# Patient Record
Sex: Female | Born: 1955
Health system: Southern US, Community
[De-identification: ages and names within clinical notes are randomized; demographics above are authoritative.]

## PROBLEM LIST (undated history)

## (undated) DIAGNOSIS — M109 Gout, unspecified: Secondary | ICD-10-CM

## (undated) DIAGNOSIS — R600 Localized edema: Secondary | ICD-10-CM

## (undated) DIAGNOSIS — R0609 Other forms of dyspnea: Secondary | ICD-10-CM

## (undated) DIAGNOSIS — M5137 Other intervertebral disc degeneration, lumbosacral region: Secondary | ICD-10-CM

## (undated) DIAGNOSIS — I480 Paroxysmal atrial fibrillation: Secondary | ICD-10-CM

## (undated) DIAGNOSIS — I5042 Chronic combined systolic (congestive) and diastolic (congestive) heart failure: Secondary | ICD-10-CM

## (undated) DIAGNOSIS — G4700 Insomnia, unspecified: Secondary | ICD-10-CM

## (undated) DIAGNOSIS — I10 Essential (primary) hypertension: Secondary | ICD-10-CM

## (undated) DIAGNOSIS — F32A Depression, unspecified: Secondary | ICD-10-CM

## (undated) DIAGNOSIS — R0789 Other chest pain: Secondary | ICD-10-CM

## (undated) DIAGNOSIS — I050 Rheumatic mitral stenosis: Secondary | ICD-10-CM

## (undated) DIAGNOSIS — R0989 Other specified symptoms and signs involving the circulatory and respiratory systems: Secondary | ICD-10-CM

## (undated) DIAGNOSIS — I059 Rheumatic mitral valve disease, unspecified: Secondary | ICD-10-CM

## (undated) DIAGNOSIS — K5790 Diverticulosis of intestine, part unspecified, without perforation or abscess without bleeding: Secondary | ICD-10-CM

## (undated) DIAGNOSIS — Z9889 Other specified postprocedural states: Secondary | ICD-10-CM

## (undated) DIAGNOSIS — Z8679 Personal history of other diseases of the circulatory system: Secondary | ICD-10-CM

## (undated) DIAGNOSIS — R609 Edema, unspecified: Secondary | ICD-10-CM

## (undated) DIAGNOSIS — D649 Anemia, unspecified: Secondary | ICD-10-CM

## (undated) DIAGNOSIS — F329 Major depressive disorder, single episode, unspecified: Secondary | ICD-10-CM

## (undated) DIAGNOSIS — K219 Gastro-esophageal reflux disease without esophagitis: Secondary | ICD-10-CM

## (undated) DIAGNOSIS — I509 Heart failure, unspecified: Secondary | ICD-10-CM

## (undated) DIAGNOSIS — Z87442 Personal history of urinary calculi: Secondary | ICD-10-CM

## (undated) DIAGNOSIS — M51379 Other intervertebral disc degeneration, lumbosacral region without mention of lumbar back pain or lower extremity pain: Secondary | ICD-10-CM

## (undated) DIAGNOSIS — I080 Rheumatic disorders of both mitral and aortic valves: Secondary | ICD-10-CM

## (undated) DIAGNOSIS — R0600 Dyspnea, unspecified: Secondary | ICD-10-CM

## (undated) HISTORY — DX: Insomnia, unspecified: G47.00

## (undated) HISTORY — DX: Essential (primary) hypertension: I10

## (undated) HISTORY — DX: Heart failure, unspecified: I50.9

## (undated) HISTORY — DX: Other chest pain: R07.89

## (undated) HISTORY — DX: Edema, unspecified: R60.9

## (undated) HISTORY — PX: CHOLECYSTECTOMY: SHX55

## (undated) HISTORY — DX: Gastro-esophageal reflux disease without esophagitis: K21.9

## (undated) HISTORY — PX: APPENDECTOMY: SHX54

## (undated) HISTORY — PX: TUBAL LIGATION: SHX77

## (undated) HISTORY — DX: Major depressive disorder, single episode, unspecified: F32.9

## (undated) HISTORY — DX: Rheumatic disorders of both mitral and aortic valves: I08.0

## (undated) HISTORY — DX: Paroxysmal atrial fibrillation: I48.0

## (undated) HISTORY — DX: Localized edema: R60.0

## (undated) HISTORY — DX: Other intervertebral disc degeneration, lumbosacral region without mention of lumbar back pain or lower extremity pain: M51.379

## (undated) HISTORY — DX: Rheumatic mitral valve disease, unspecified: I05.9

## (undated) HISTORY — DX: Anemia, unspecified: D64.9

## (undated) HISTORY — PX: TOTAL ABDOMINAL HYSTERECTOMY: SHX209

## (undated) HISTORY — DX: Morbid (severe) obesity due to excess calories: E66.01

## (undated) HISTORY — DX: Dyspnea, unspecified: R06.00

## (undated) HISTORY — PX: HEEL SPUR SURGERY: SHX665

## (undated) HISTORY — DX: Other forms of dyspnea: R06.09

## (undated) HISTORY — PX: TOTAL KNEE ARTHROPLASTY: SHX125

## (undated) HISTORY — DX: Other intervertebral disc degeneration, lumbosacral region: M51.37

## (undated) HISTORY — DX: Personal history of other diseases of the circulatory system: Z86.79

## (undated) HISTORY — DX: Rheumatic mitral stenosis: I05.0

## (undated) HISTORY — DX: Other specified symptoms and signs involving the circulatory and respiratory systems: R09.89

## (undated) HISTORY — DX: Other specified postprocedural states: Z98.890

## (undated) HISTORY — DX: Depression, unspecified: F32.A

## (undated) SURGERY — ECHOCARDIOGRAM, TRANSESOPHAGEAL
Anesthesia: Moderate Sedation

---

## 2003-11-09 HISTORY — PX: ESOPHAGOGASTRODUODENOSCOPY: SHX1529

## 2004-04-21 ENCOUNTER — Ambulatory Visit (HOSPITAL_COMMUNITY): Admission: RE | Admit: 2004-04-21 | Discharge: 2004-04-21 | Payer: Self-pay | Admitting: Internal Medicine

## 2006-10-06 ENCOUNTER — Ambulatory Visit: Payer: Self-pay | Admitting: Cardiology

## 2007-01-11 ENCOUNTER — Ambulatory Visit: Payer: Self-pay | Admitting: Cardiology

## 2007-01-17 ENCOUNTER — Ambulatory Visit: Payer: Self-pay | Admitting: Cardiology

## 2007-01-20 ENCOUNTER — Inpatient Hospital Stay (HOSPITAL_BASED_OUTPATIENT_CLINIC_OR_DEPARTMENT_OTHER): Admission: RE | Admit: 2007-01-20 | Discharge: 2007-01-20 | Payer: Self-pay | Admitting: Cardiology

## 2007-01-20 ENCOUNTER — Ambulatory Visit: Payer: Self-pay | Admitting: Cardiology

## 2007-02-21 ENCOUNTER — Ambulatory Visit: Payer: Self-pay | Admitting: Cardiology

## 2007-07-10 ENCOUNTER — Ambulatory Visit: Payer: Self-pay | Admitting: Cardiology

## 2007-11-09 HISTORY — PX: BREAST BIOPSY: SHX20

## 2007-12-19 ENCOUNTER — Encounter: Payer: Self-pay | Admitting: Cardiology

## 2008-01-18 ENCOUNTER — Ambulatory Visit: Payer: Self-pay | Admitting: Cardiology

## 2008-01-18 ENCOUNTER — Encounter: Payer: Self-pay | Admitting: Physician Assistant

## 2008-01-25 ENCOUNTER — Encounter: Payer: Self-pay | Admitting: Cardiology

## 2008-01-29 ENCOUNTER — Ambulatory Visit: Payer: Self-pay | Admitting: Cardiology

## 2008-03-09 ENCOUNTER — Ambulatory Visit: Payer: Self-pay | Admitting: Cardiology

## 2008-03-12 ENCOUNTER — Encounter: Payer: Self-pay | Admitting: Cardiology

## 2008-03-28 ENCOUNTER — Ambulatory Visit: Payer: Self-pay | Admitting: Cardiology

## 2008-06-19 ENCOUNTER — Ambulatory Visit: Payer: Self-pay | Admitting: Cardiology

## 2008-06-24 ENCOUNTER — Ambulatory Visit: Payer: Self-pay | Admitting: Cardiology

## 2008-06-24 ENCOUNTER — Encounter: Payer: Self-pay | Admitting: Physician Assistant

## 2008-10-29 ENCOUNTER — Ambulatory Visit (HOSPITAL_COMMUNITY): Admission: RE | Admit: 2008-10-29 | Discharge: 2008-10-29 | Payer: Self-pay | Admitting: Neurosurgery

## 2009-02-13 ENCOUNTER — Encounter: Payer: Self-pay | Admitting: Cardiology

## 2009-02-13 ENCOUNTER — Ambulatory Visit: Payer: Self-pay | Admitting: Cardiology

## 2009-02-27 ENCOUNTER — Ambulatory Visit: Payer: Self-pay | Admitting: Cardiology

## 2009-03-05 ENCOUNTER — Ambulatory Visit: Payer: Self-pay | Admitting: Cardiology

## 2009-04-02 ENCOUNTER — Ambulatory Visit: Payer: Self-pay | Admitting: Cardiology

## 2009-04-03 ENCOUNTER — Encounter: Payer: Self-pay | Admitting: Cardiology

## 2009-07-07 DIAGNOSIS — I4891 Unspecified atrial fibrillation: Secondary | ICD-10-CM | POA: Insufficient documentation

## 2009-07-07 DIAGNOSIS — I080 Rheumatic disorders of both mitral and aortic valves: Secondary | ICD-10-CM | POA: Insufficient documentation

## 2009-07-07 DIAGNOSIS — I10 Essential (primary) hypertension: Secondary | ICD-10-CM | POA: Insufficient documentation

## 2009-07-08 ENCOUNTER — Encounter: Payer: Self-pay | Admitting: Cardiology

## 2009-09-02 ENCOUNTER — Encounter: Payer: Self-pay | Admitting: Cardiology

## 2009-09-21 ENCOUNTER — Encounter: Payer: Self-pay | Admitting: Cardiology

## 2009-09-22 ENCOUNTER — Encounter (INDEPENDENT_AMBULATORY_CARE_PROVIDER_SITE_OTHER): Payer: Self-pay | Admitting: *Deleted

## 2009-09-22 ENCOUNTER — Ambulatory Visit: Payer: Self-pay | Admitting: Cardiology

## 2009-09-22 DIAGNOSIS — I5033 Acute on chronic diastolic (congestive) heart failure: Secondary | ICD-10-CM | POA: Insufficient documentation

## 2009-09-25 ENCOUNTER — Encounter: Payer: Self-pay | Admitting: Cardiology

## 2009-09-26 ENCOUNTER — Ambulatory Visit: Payer: Self-pay | Admitting: Cardiology

## 2009-09-26 ENCOUNTER — Encounter: Payer: Self-pay | Admitting: Cardiology

## 2009-10-07 ENCOUNTER — Encounter (INDEPENDENT_AMBULATORY_CARE_PROVIDER_SITE_OTHER): Payer: Self-pay | Admitting: *Deleted

## 2009-11-08 HISTORY — PX: COLONOSCOPY: SHX174

## 2009-11-11 ENCOUNTER — Encounter: Payer: Self-pay | Admitting: Cardiology

## 2009-11-14 ENCOUNTER — Ambulatory Visit: Payer: Self-pay | Admitting: Cardiology

## 2009-12-26 ENCOUNTER — Ambulatory Visit: Payer: Self-pay | Admitting: Cardiology

## 2009-12-26 DIAGNOSIS — R0609 Other forms of dyspnea: Secondary | ICD-10-CM | POA: Insufficient documentation

## 2009-12-26 DIAGNOSIS — R0600 Dyspnea, unspecified: Secondary | ICD-10-CM | POA: Insufficient documentation

## 2010-01-07 ENCOUNTER — Encounter: Payer: Self-pay | Admitting: Cardiology

## 2010-01-27 ENCOUNTER — Encounter: Payer: Self-pay | Admitting: Cardiology

## 2010-02-09 ENCOUNTER — Ambulatory Visit: Payer: Self-pay | Admitting: Cardiology

## 2010-02-09 ENCOUNTER — Encounter: Payer: Self-pay | Admitting: Cardiology

## 2010-02-20 ENCOUNTER — Encounter (INDEPENDENT_AMBULATORY_CARE_PROVIDER_SITE_OTHER): Payer: Self-pay | Admitting: *Deleted

## 2010-07-30 ENCOUNTER — Ambulatory Visit: Payer: Self-pay | Admitting: Cardiology

## 2010-07-30 ENCOUNTER — Encounter (INDEPENDENT_AMBULATORY_CARE_PROVIDER_SITE_OTHER): Payer: Self-pay | Admitting: *Deleted

## 2010-07-30 DIAGNOSIS — R072 Precordial pain: Secondary | ICD-10-CM | POA: Insufficient documentation

## 2010-08-06 ENCOUNTER — Ambulatory Visit: Payer: Self-pay | Admitting: Cardiology

## 2010-08-10 ENCOUNTER — Telehealth (INDEPENDENT_AMBULATORY_CARE_PROVIDER_SITE_OTHER): Payer: Self-pay | Admitting: *Deleted

## 2010-08-11 ENCOUNTER — Encounter: Payer: Self-pay | Admitting: Cardiology

## 2010-08-11 ENCOUNTER — Telehealth (INDEPENDENT_AMBULATORY_CARE_PROVIDER_SITE_OTHER): Payer: Self-pay | Admitting: *Deleted

## 2010-08-11 ENCOUNTER — Encounter (INDEPENDENT_AMBULATORY_CARE_PROVIDER_SITE_OTHER): Payer: Self-pay | Admitting: *Deleted

## 2010-08-11 DIAGNOSIS — R9439 Abnormal result of other cardiovascular function study: Secondary | ICD-10-CM | POA: Insufficient documentation

## 2010-08-12 ENCOUNTER — Ambulatory Visit (HOSPITAL_COMMUNITY): Admission: RE | Admit: 2010-08-12 | Discharge: 2010-08-13 | Payer: Self-pay | Admitting: Cardiology

## 2010-08-12 ENCOUNTER — Ambulatory Visit: Payer: Self-pay | Admitting: Cardiology

## 2010-08-13 ENCOUNTER — Encounter: Payer: Self-pay | Admitting: Physician Assistant

## 2010-08-17 ENCOUNTER — Encounter: Payer: Self-pay | Admitting: Cardiology

## 2010-08-28 ENCOUNTER — Ambulatory Visit: Payer: Self-pay | Admitting: Physician Assistant

## 2010-08-28 DIAGNOSIS — I5032 Chronic diastolic (congestive) heart failure: Secondary | ICD-10-CM | POA: Insufficient documentation

## 2010-09-07 ENCOUNTER — Encounter: Payer: Self-pay | Admitting: Cardiology

## 2010-09-08 ENCOUNTER — Encounter (INDEPENDENT_AMBULATORY_CARE_PROVIDER_SITE_OTHER): Payer: Self-pay | Admitting: *Deleted

## 2010-09-11 ENCOUNTER — Ambulatory Visit: Payer: Self-pay | Admitting: Physician Assistant

## 2010-09-11 DIAGNOSIS — D649 Anemia, unspecified: Secondary | ICD-10-CM | POA: Insufficient documentation

## 2010-09-17 ENCOUNTER — Encounter: Payer: Self-pay | Admitting: Cardiology

## 2010-09-17 DIAGNOSIS — E876 Hypokalemia: Secondary | ICD-10-CM | POA: Insufficient documentation

## 2010-09-25 ENCOUNTER — Encounter: Payer: Self-pay | Admitting: Physician Assistant

## 2010-10-19 ENCOUNTER — Encounter: Payer: Self-pay | Admitting: Cardiology

## 2010-10-19 ENCOUNTER — Encounter (INDEPENDENT_AMBULATORY_CARE_PROVIDER_SITE_OTHER): Payer: Self-pay | Admitting: *Deleted

## 2010-12-07 ENCOUNTER — Encounter: Payer: Self-pay | Admitting: Physician Assistant

## 2010-12-08 NOTE — Miscellaneous (Signed)
Summary: Orders Update - TEE  Clinical Lists Changes  Orders: Added new Referral order of Trans Esophageal Echocardiogram (TEE) - Signed

## 2010-12-08 NOTE — Assessment & Plan Note (Signed)
Summary: 2 WK F/U PER 08/28/10 OV W/Toni [name]-JM   Visit Type:  Follow-up Primary Toni [name]:  Dr. Sherril Croon   History of Present Illness: patient returns for early schedule followup.  She reports improved diuresis on Demadex. Her labs were stable at time of last OV, with potassium 4.3, creatinine 1.0, and BNP 260.  She has lost 1 pound since her last visit.  She continues to have a sensation of "air hunger", and is now beginning to think it may be related to dysphagia. With respect to her chronic LE edema, she continues to wear compression stockings while at work, and notes no appreciable exacerbation.    Preventive Screening-Counseling & Management  Alcohol-Tobacco     Smoking Status: quit     Year Quit: 1997  Current Medications (verified): 1)  Aspirin 81 Mg Tbec (Aspirin) .... Take 1 Tablet By Mouth Once A Day 2)  Benicar Hct 40-25 Mg Tabs (Olmesartan Medoxomil-Hctz) .... Take 1 Tablet By Mouth Once A Day 3)  Metoprolol Tartrate 50 Mg Tabs (Metoprolol Tartrate) .... Take One Tablet By Mouth Twice A Day 4)  Protonix 40 Mg Tbec (Pantoprazole Sodium) .... Take 1 Tablet By Mouth Once A Day 5)  Potassium Chloride Crys Cr 20 Meq Cr-Tabs (Potassium Chloride Crys Cr) .... Take One Tablet By Mouth Daily 6)  Nitroglycerin 0.4 Mg Subl (Nitroglycerin) .... One Tablet Under Tongue Every 5 Minutes As Needed For Chest Pain---May Repeat Times Three 7)  Trazodone Hcl 50 Mg Tabs (Trazodone Hcl) .... 1/2 - 1 Tab By Mouth At Bedtime For Insomnia 8)  Levbid 0.375 Mg Xr12h-Tab (Hyoscyamine Sulfate) .... Take 1 Tablet By Mouth Two Times A Day 9)  Hydrocodone-Acetaminophen 7.5-500 Mg Tabs (Hydrocodone-Acetaminophen) .... As Needed 10)  Indomethacin 25 Mg Caps (Indomethacin) .... Take 1 Tablet By Mouth Two Times A Day As Needed 11)  Demadex 20 Mg Tabs (Torsemide) .... Take 1 Tablet By Mouth Once A Day  Allergies (verified): 1)  ! * Ivp Dye  Comments:  Nurse/Medical Assistant: The patient's medication list  and allergies were reviewed with the patient and were updated in the Medication and Allergy Lists.  Past History:  Past Medical History: MITRAL REGURGITATION (ICD-396.3) ATRIAL FIBRILLATION (ICD-427.31) HYPERTENSION, UNSPECIFIED (ICD-401.9) Degenerative disk disease.  Insomnia 1. Paroxysmal atrial fibrillation, stable without recurrence. 2. Moderate mitral regurgitation followed by yearly echocardiography. 3. History of volume overload diastolic heart failure stable. 4. Hypertension. 5. Degenerative disk disease. 6. Insomnia. Normal coronary arteries, 10/11 Diastolic heart failure Chronic peripheral edema  Review of Systems       No fevers, chills, hemoptysis, dysphagia, melena, hematocheezia, hematuria, rash, claudication, orthopnea, pnd, pedal edema. All other systems negative.   Vital Signs:  Patient profile:   55 year old female Height:      69 inches Weight:      246 pounds O2 Sat:      99 % on Room air Pulse rate:   81 / minute BP sitting:   115 / 68  (left arm) Cuff size:   large  Vitals Entered By: Carlye Grippe (September 11, 2010 1:35 PM)  O2 Flow:  Room air  Physical Exam  Additional Exam:  GEN: 55 year old female, moderately obese, sitting upright, no distress HEENT: NCAT,PERRLA,EOMI NECK: palpable pulses, no bruits; no JVD; no TM LUNGS: CTA bilaterally HEART: RRR (S1S2); no significant murmurs; no rubs; no gallops ABD: soft, NT; intact BS EXT: stable right groin, with no hematoma, ecchymosis, or bruit; 1+ bilateral peripheral edema SKIN: warm, dry MUSC:  no obvious deformity NEURO: A/O (x3)     Impression & Recommendations:  Problem # 1:  CHRONIC DIASTOLIC HEART FAILURE (ICD-428.32)  euvolemic by history and physical examination. Recent BNP level to 260. Diuresing well with substitution of Demadex for furosemide, with recent stable labs. Will repeat metabolic profile and BNP level, and adjust supplemental potassium,  accordingly.  Orders: T-Basic Metabolic Panel [id number]) T-BNP  (B Natriuretic Peptide) ([id number])  Problem # 2:  HYPERTENSION, UNSPECIFIED (ICD-401.9)  continues to remain well-controlled  Orders: T-Basic Metabolic Panel [phone]) T-BNP  (B Natriuretic Peptide) ([id number])  Problem # 3:  ATRIAL FIBRILLATION (ICD-427.31)  maintaining NSR on physical examination. CHADS score of 1 (HTN), on aspirin.  Orders: T-Basic Metabolic Panel [id number]) T-BNP  (B Natriuretic Peptide) ([id number])  Problem # 4:  MITRAL REGURGITATION (ICD-396.3) Assessment: Comment Only  Problem # 5:  ANEMIA (ICD-285.9)  stable hemoglobin 10.6, by recent labs  Patient Instructions: 1)  Labs:  BMET, BNP today 2)  Follow up in  4 months

## 2010-12-08 NOTE — Letter (Signed)
Summary: Internal Correspondence/ FAXED OUTPATIENT CATH  Internal Correspondence/ FAXED OUTPATIENT CATH   Imported By: Dorise Hiss 08/25/2010 15:39:27  _____________________________________________________________________  External Attachment:    Type:   Image     Comment:   External Document

## 2010-12-08 NOTE — Miscellaneous (Signed)
Summary: Orders Update - bmet  Clinical Lists Changes  Problems: Added new problem of HYPOKALEMIA (ICD-276.8) Orders: Added new Test order of T-Basic Metabolic Panel [id number]) - Signed

## 2010-12-08 NOTE — Progress Notes (Signed)
Summary: Patient returning call   Phone Note Call from Patient Call back at Home Phone [phone]   Caller: Patient Reason for Call: Talk to Nurse Summary of Call: Patient called in reference to message she received about needing a cath.    You can reach patient on her cell [phone] or third floor at [facility]  X [phone] Initial call taken by: Claudette Laws,  August 10, 2010 12:45 PM  Follow-up for Phone Call        patient called back if you call 10/4 before 10:30 call her house after 11:00 call her work number Claudette Laws  August 10, 2010 4:05 PM  Spoke with pt this morning and she would like to get done as soon as possible.  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   Follow-up by: Hoover Brunette, LPN,  August 11, 2010 10:23 AM  Additional Follow-up for Phone Call Additional follow up Details #1::        Left heart cath scheduled for Wednesday, October 5  at 10:00 in the main lab at [facility].  She will need to arrive at 8:00 for same day work.   Hoover Brunette, LPN  August 11, 2010 10:31 AM   Time changed to 3:30 due to pt transportation. Patient aware of above. Additional Follow-up by: Hoover Brunette, LPN,  August 11, 2010 4:36 PM  New Problems: OTH NONSPECIFIC ABNORM CV SYSTEM FUNCTION STUDY (ICD-794.39)   New Problems: OTH NONSPECIFIC ABNORM CV SYSTEM FUNCTION STUDY (ICD-794.39)

## 2010-12-08 NOTE — Letter (Signed)
Summary: Engineer, materials at [facility]  [address]   [place], [place] [address]   Phone: [phone]  Fax: [phone]        February 20, 2010 MRN: [id number]   Toni [name] [address] [place], [place]  [address]   Dear Ms. Thurmond Butts,  Your test ordered by Selena Batten has been reviewed by your physician (or physician assistant) and was found to be normal or stable. Your physician (or physician assistant) felt no changes were needed at this time.  __X__ Echocardiogram  ____ Cardiac Stress Test  ____ Lab Work  ____ Peripheral vascular study of arms, legs or neck  ____ CT scan or X-ray  ____ Lung or Breathing test  ____ Other:   Thank you.   Hoover Brunette, LPN    Duane Boston, M.D., F.A.C.C. Thressa Sheller, M.D., F.A.C.C. Oneal Grout, M.D., F.A.C.C. Cheree Ditto, M.D., F.A.C.C. Daiva Nakayama, M.D., F.A.C.C. Kenney Houseman, M.D., F.A.C.C. Jeanne Ivan, PA-C

## 2010-12-08 NOTE — Letter (Signed)
Summary: Lexiscan or Dobutamine Pharmacist, community at [facility]  [address]   [place], [place] [address]   Phone: [phone]  Fax: [phone]      [facility]  Lexiscan or Dobutamine Cardiolite Strss Test    Toni [name]  Appointment Date:_  Appointment Time:_  Your doctor has ordered a CARDIOLITE STRESS TEST using a medication to stimulate exercise so that you will not have to walk on the treadmill to determine the condition of your heart during stress. If you take blood pressure medication, ask your doctor if you should take it the day of your test. You should not have anything to eat or drink at least 4 hours before your test is scheduled, and no caffeine, including decaffeinated tea and coffee, chocolate, and soft drinks for 24 hours before your test.  You will need to register at the Outpatient/Main Entrance at the hospital 15 minutes before your appointment time. It is a good idea to bring a copy of your order with you. They will direct you to the Diagnostic Imaging (Radiology) Department.  You will be asked to undress from the waist up and given a hospital gown to wear, so dress comfortably from the waist down for example: Sweat pants, shorts, or skirt Rubber soled lace up shoes (tennis shoes)  Plan on about three hours from registration to release from the hospital

## 2010-12-08 NOTE — Letter (Signed)
Summary: Generic Engineer, agricultural at [facility] [address]   [place], [place] [address]   Phone: [phone]  Fax: [phone]        September 08, 2010 MRN: [id number]    Toni [name] [address] [place], [place]  [address]    Toni [name] is a patient of mine. She has significant underlying heart disease with mitral regurgitation and diastolic dysfunction limiting her daily activities. I am writing this letter so she can obtain a parking space that is closer to the entrance of the hospital. It is not advised for patient to walk a long distance when coming to her work place. If you've any questions regarding this request please do not hesitate to contact me  Sincerely, Lewayne Bunting, MD, [facility]  September 07, 2010 3:31 PM          Sincerely,  Hoover Brunette, LPN  This letter has been electronically signed by your physician.

## 2010-12-08 NOTE — Miscellaneous (Signed)
  Clinical Lists Changes  orothy Rambeau is a patient of mine. She has significant underlying heart disease with mitral regurgitation and diastolic dysfunction limiting her daily activities. I am writing this letter so she can obtain a parking space that is closer to the entrance of the hospital. It is not advised for patient to walk a long distance when coming to her work place. If you've any questions regarding this request please do not hesitate to contact me  Sincerely, Lewayne Bunting, MD, [facility]  September 07, 2010 3:31 PM  Appended Document:  Will give to patient at OV on 11/4.

## 2010-12-08 NOTE — Miscellaneous (Signed)
Summary: Orders Update  Clinical Lists Changes  Orders: Added new Referral order of 2-D Echocardiogram (2D Echo) - Signed 

## 2010-12-08 NOTE — Assessment & Plan Note (Signed)
Summary: 6 MO FU PER AUG REMINDER-SRS   Visit Type:  Follow-up Primary Courtland Reas:  Dr. Sherril Croon   History of Present Illness: the patient is a 55 year old female prior history of moderate mitral regurgitation and chronic dyspnea. The patient is a strip diastolic heart failure. She states however that she is doing somewhat better. She is chronic lower extremity edema that is not responsive to increased dosing of Lasix. The patient however does not wear compression stockings during her work. She stands most of the time. She denies any substernal chest pain. She has no orthopnea or PND. He denies any palpitations or syncope.  Preventive Screening-Counseling & Management  Alcohol-Tobacco     Smoking Status: quit     Year Quit: 1997  Current Medications (verified): 1)  Aspirin 81 Mg Tbec (Aspirin) .... Take 1 Tablet By Mouth Once A Day 2)  Benicar Hct 40-12.5 Mg Tabs (Olmesartan Medoxomil-Hctz) .... Take 1 Tablet By Mouth Every Morning 3)  Furosemide 80 Mg Tabs (Furosemide) .... Take 1 Tablet By Mouth Two Times A Day 4)  Lopressor 100 Mg Tabs (Metoprolol Tartrate) .... Take 1 Tablet By Mouth Two Times A Day 5)  Protonix 40 Mg Tbec (Pantoprazole Sodium) .... Take 1 Tablet By Mouth Once A Day 6)  Potassium Chloride Crys Cr 20 Meq Cr-Tabs (Potassium Chloride Crys Cr) .... Take One Tablet By Mouth Twice A Day 7)  Nitroglycerin 0.4 Mg Subl (Nitroglycerin) .... One Tablet Under Tongue Every 5 Minutes As Needed For Chest Pain---May Repeat Times Three 8)  Trazodone Hcl 50 Mg Tabs (Trazodone Hcl) .... 1/2 - 1 Tab By Mouth At Bedtime For Insomnia 9)  Levbid 0.375 Mg Xr12h-Tab (Hyoscyamine Sulfate) .... Take 1 Tablet By Mouth Two Times A Day 10)  Hydrocodone-Acetaminophen 5-500 Mg Tabs (Hydrocodone-Acetaminophen) .... As Needed Pain 11)  Indomethacin 25 Mg Caps (Indomethacin) .... Take 1 Tablet By Mouth Two Times A Day As Needed  Allergies (verified): No Known Drug Allergies  Comments:  Nurse/Medical  Assistant: The patient's medications were reviewed with the patient and were updated in the Medication List. Pt verbally confirmed medications.  Cyril Loosen, RN, BSN (July 30, 2010 10:49 AM)  Past History:  Past Medical History: Last updated: 09/21/2009 MITRAL REGURGITATION (ICD-396.3) ATRIAL FIBRILLATION (ICD-427.31) HYPERTENSION, UNSPECIFIED (ICD-401.9) Degenerative disk disease.  Insomnia 1. Paroxysmal atrial fibrillation, stable without recurrence. 2. Moderate mitral regurgitation followed by yearly echocardiography. 3. History of volume overload diastolic heart failure stable. 4. Hypertension. 5. Degenerative disk disease. 6. Insomnia.  Past Surgical History: Last updated: 07/04/2009 Abdominal Hysterectomy-Total Appendectomy Cholecystectomy Knee Arthroplasty-Total  Family History: Last updated: 07/04/2009 Family History of Cancer:  Family History of CVA or Stroke:  Family History of Diabetes:  Family History of Hyperlipidemia:  Family History of Hypertension:   Social History: Last updated: 07/04/2009 Full Time Single  Tobacco Use - Former.  Alcohol Use - no Regular Exercise - yes Drug Use - no  Risk Factors: Smoking Status: quit (07/30/2010)  Review of Systems       The patient complains of shortness of breath.  The patient denies fatigue, malaise, fever, weight gain/loss, vision loss, decreased hearing, hoarseness, chest pain, palpitations, prolonged cough, wheezing, sleep apnea, coughing up blood, abdominal pain, blood in stool, nausea, vomiting, diarrhea, heartburn, incontinence, blood in urine, muscle weakness, joint pain, leg swelling, rash, skin lesions, headache, fainting, dizziness, depression, anxiety, enlarged lymph nodes, easy bruising or bleeding, and environmental allergies.    Vital Signs:  Patient profile:   55 year old female  Height:      69 inches Weight:      249.25 pounds BMI:     36.94 Pulse rate:   65 / minute BP sitting:    136 / 82  (left arm) Cuff size:   large  Vitals Entered By: Cyril Loosen, RN, BSN (July 30, 2010 10:44 AM)  Nutrition Counseling: Patient's BMI is greater than 25 and therefore counseled on weight management options. Comments Follow up visit   Physical Exam  Additional Exam:  General: Well-developed, well-nourished in no distress head: Normocephalic and atraumatic eyes PERRLA/EOMI intact, conjunctiva and lids normal nose: No deformity or lesions mouth normal dentition, normal posterior pharynx neck: Supple, no JVD.  No masses, thyromegaly or abnormal cervical nodes lungs: Normal breath sounds bilaterally without wheezing.  Normal percussion heart: regular rate and rhythm with normal S1 and S2, no S3 or S4.  PMI is normal.  2/6 holosystolic murmur at the apex. abdomen: Normal bowel sounds, abdomen is soft and nontender without masses, organomegaly or hernias noted.  No hepatosplenomegaly musculoskeletal: Back normal, normal gait muscle strength and tone normal pulsus: Pulse is normal in all 4 extremities Extremities: 1+ peripheral pitting edema neurologic: Alert and oriented x 3 skin: Intact without lesions or rashes cervical nodes: No significant adenopathy psychologic: Normal affect    EKG  Procedure date:  07/30/2010  Findings:      normal sinus rhythm. Left atrial enlargement. Left ventricle hypertrophy. Heart rate 65 beats per minute  Impression & Recommendations:  Problem # 1:  ACUTE ON CHRONIC DIASTOLIC HEART FAILURE (ICD-428.33) patient has no definite evidence of volume overload. She has chronic dependent lower extremity edema in part due to venous insufficiency. I recommended compression stockings. Her updated medication list for this problem includes:    Aspirin 81 Mg Tbec (Aspirin) .Marland Kitchen... Take 1 tablet by mouth once a day    Benicar Hct 40-12.5 Mg Tabs (Olmesartan medoxomil-hctz) .Marland Kitchen... Take 1 tablet by mouth every morning    Furosemide 80 Mg Tabs  (Furosemide) .Marland Kitchen... Take 1 tablet by mouth two times a day    Lopressor 100 Mg Tabs (Metoprolol tartrate) .Marland Kitchen... Take 1 tablet by mouth two times a day    Nitroglycerin 0.4 Mg Subl (Nitroglycerin) ..... One tablet under tongue every 5 minutes as needed for chest pain---may repeat times three  Problem # 2:  MITRAL REGURGITATION (ICD-396.3) the patient has moderate mitral regurgitation. She had a recent echocardiogram. We will continue to follow this. I do not think she has symptomatic mitral regurgitation.  Problem # 3:  HYPERTENSION, UNSPECIFIED (ICD-401.9) blood pressure control. No adjustment of medications. Her updated medication list for this problem includes:    Aspirin 81 Mg Tbec (Aspirin) .Marland Kitchen... Take 1 tablet by mouth once a day    Benicar Hct 40-12.5 Mg Tabs (Olmesartan medoxomil-hctz) .Marland Kitchen... Take 1 tablet by mouth every morning    Furosemide 80 Mg Tabs (Furosemide) .Marland Kitchen... Take 1 tablet by mouth two times a day    Lopressor 100 Mg Tabs (Metoprolol tartrate) .Marland Kitchen... Take 1 tablet by mouth two times a day  Other Orders: EKG w/ Interpretation (93000) Nuclear Med (Nuc Med)  Patient Instructions: 1)  Lexiscan stress test 2)  Follow up in  6 months

## 2010-12-08 NOTE — Letter (Signed)
Summary: Cardiac Cath Instructions - Main Lab  [facility] at [facility]. [address]   [place], [place] [address]   Phone: [phone]  Fax: [phone]     08/11/2010 MRN: [id number]  Cyenna Huckins [address] [place], [place]  [address]  Dear Ms. CERRO,   You are scheduled for Cardiac Catheterization on Wednesday, October 5 at 10:00 with Dr. Juanda Chance.  Please arrive at the [facility] at 8:00     a.m. on the day of your procedure.  1. DIET     __X__ Nothing to eat or drink after midnight except your medications with a sip of water.  2. Come to the [place] office on             for lab work.  The lab at [facility] is open from 8:30 a.m. to 1:30 p.m. and 2:30 p.m. to 5:00 p.m.  The lab at [facility] is open from 7:30 a.m. to 5:30 p.m.  You do not have to be fasting.  3. MAKE SURE YOU TAKE YOUR ASPIRIN.  4. __X___ DO NOT TAKE these medications before your procedure:  Benicar/HCTZ and Furosemide.       5. __X___ YOU MAY TAKE ALL of your remaining medications with a small amount of water.  6._____ START NEW medications:  7._____ Pre-med instructions:     _______________________________________________________________________  8. Plan for one night stay - bring personal belongings (i.e. toothpaste, toothbrush, etc.)  9. Bring a current list of your medications and current insurance cards.  10.Must have a responsible person to drive you home.   11.Someone must be with you for the first 24 hours after you arrive home.  9. Please wear clothes that are easy to get on and off and wear slip-on shoes.  *Special note: Every effort is made to have your procedure done on time.  Occasionally there are emergencies that present themselves at the hospital that may cause delays.  Please be patient if a delay does occur.  If you have any questions after you get home, please call the office at the number listed above.  Hoover Brunette, LPN

## 2010-12-08 NOTE — Assessment & Plan Note (Signed)
Summary: post cath   Visit Type:  Follow-up Primary Kirstina Leinweber:  Dr. Sherril Croon   History of Present Illness: patient presents for post catheterization followup, following recent abnormal nuclear imaging scan suggestive interferonbasal ischemia. Patient was recently seen here in the clinic, by Dr. Andee Lineman, and has history of chronic CHF and moderate mitral regurgitation.   Coronary angiography, however, yielded normal coronary arteries, but with small area of inferior HK (uncertain etiology). Ejection fraction estimated at 60%, with no significant MR. Dr. Juanda Chance did find evidence of mild/moderate LV EDP, consistent with diastolic heart failure.  Patient was kept for overnight observation, but was sent home on previous home medication regimen.  She presents today complaining of persistent DOE, orthopnea, and chronic lower extremity edema. She does use support stockings while at work. She does not add additional salt to her prepared foods. She has lost 2 pounds, since her most recent visit.  Patient suggests that her long time dose of furosemide does not appear to be effective.  Preventive Screening-Counseling & Management  Alcohol-Tobacco     Smoking Status: quit     Year Quit: 1997  Current Medications (verified): 1)  Aspirin 81 Mg Tbec (Aspirin) .... Take 1 Tablet By Mouth Once A Day 2)  Benicar Hct 40-12.5 Mg Tabs (Olmesartan Medoxomil-Hctz) .... Take 1 Tablet By Mouth Every Morning 3)  Furosemide 80 Mg Tabs (Furosemide) .... Take 1 Tablet By Mouth Two Times A Day 4)  Lopressor 100 Mg Tabs (Metoprolol Tartrate) .... Take 1 Tablet By Mouth Two Times A Day 5)  Protonix 40 Mg Tbec (Pantoprazole Sodium) .... Take 1 Tablet By Mouth Once A Day 6)  Potassium Chloride Crys Cr 20 Meq Cr-Tabs (Potassium Chloride Crys Cr) .... Take One Tablet By Mouth Twice A Day 7)  Nitroglycerin 0.4 Mg Subl (Nitroglycerin) .... One Tablet Under Tongue Every 5 Minutes As Needed For Chest Pain---May Repeat Times  Three 8)  Trazodone Hcl 50 Mg Tabs (Trazodone Hcl) .... 1/2 - 1 Tab By Mouth At Bedtime For Insomnia 9)  Levbid 0.375 Mg Xr12h-Tab (Hyoscyamine Sulfate) .... Take 1 Tablet By Mouth Two Times A Day 10)  Hydrocodone-Acetaminophen 7.5-500 Mg Tabs (Hydrocodone-Acetaminophen) .... As Needed 11)  Indomethacin 25 Mg Caps (Indomethacin) .... Take 1 Tablet By Mouth Two Times A Day As Needed  Allergies (verified): 1)  ! * Ivp Dye  Comments:  Nurse/Medical Assistant: The patient's medication list and allergies were reviewed with the patient and were updated in the Medication and Allergy Lists.  Past History:  Past Medical History: MITRAL REGURGITATION (ICD-396.3) ATRIAL FIBRILLATION (ICD-427.31) HYPERTENSION, UNSPECIFIED (ICD-401.9) Degenerative disk disease.  Insomnia 1. Paroxysmal atrial fibrillation, stable without recurrence. 2. Moderate mitral regurgitation followed by yearly echocardiography. 3. History of volume overload diastolic heart failure stable. 4. Hypertension. 5. Degenerative disk disease. 6. Insomnia. Normal coronary arteries, 10/11 Diastolic heart failure  Review of Systems       No fevers, chills, hemoptysis, dysphagia, melena, hematocheezia, hematuria, rash, claudication, or pnd. complains of diminished energy. All other systems negative.   Vital Signs:  Patient profile:   55 year old female Height:      69 inches Weight:      247 pounds Pulse rate:   60 / minute BP sitting:   123 / 79  (left arm) Cuff size:   large  Vitals Entered By: Carlye Grippe (August 28, 2010 1:00 PM)  Physical Exam  Additional Exam:  GEN: 55 year old female, moderately obese, sitting upright, no distress HEENT: NCAT,PERRLA,EOMI NECK: palpable  pulses, no bruits; no JVD; no TM LUNGS: CTA bilaterally HEART: RRR (S1S2); no significant murmurs; no rubs; no gallops ABD: soft, NT; intact BS EXT: stable right groin, with no hematoma, ecchymosis, or bruit; 1+ bilateral peripheral  edema SKIN: warm, dry MUSC: no obvious deformity NEURO: A/O (x3)     EKG  Procedure date:  08/28/2010  Findings:      sinus bradycardia 59 bpm; borderline LVH; no ischemic changes  Impression & Recommendations:  Problem # 1:  CHRONIC DIASTOLIC HEART FAILURE (ICD-428.32)  patient complains of persistent DOE and peripheral edema. Recent coronary angiography indicated normal coronary arteries, but with elevated LVEDP consistent with CHF. Will substitute furosemide with Demadex, starting at 20 mg daily, to see if this results in improved diuresis. We'll also increase the HCTZ component of Benicar HCT to 25 mg daily, again to provide additional diuretic benefit. We'll check complete labs today, including CMET, CBC, and BNP level. Of note, patient does have previously documented anemia, and this may be a component of her persistent exertional dyspnea. Also, will decrease Lopressor to 58 mg b.i.d., given complaints of diminished energy. She is also encouraged to continue wearing compression stockings, particularly while at work.  Problem # 2:  HYPERTENSION, UNSPECIFIED (ICD-401.9)  well-controlled on current regimen  Problem # 3:  MITRAL REGURGITATION (ICD-396.3)  assessment as moderate by most recent 2-D echo; however, no significant MR noted by recent coronary angiography  Problem # 4:  ATRIAL FIBRILLATION (ICD-427.31)  maintaining normal sinus rhythm. CHADS score of 1 (HTN). Patient is on aspirin  Other Orders: EKG w/ Interpretation (93000) T-Comprehensive Metabolic Panel ([id number]) T-CBC No Diff ([id number]) T-BNP  (B Natriuretic Peptide) ([id number])  Patient Instructions: 1)  Follow up with Gene on Friday, Sep 11, 2010 at 1:40pm. 2)  Decrease Lopressor (metoprolol) to 50mg  two times a day. You can take 1/2 of your 100mg  tablets in am and 1/2 in pm. 3)  Stop Lasix (furosemide) 4)  Start Demadex 20mg  by mouth once daily. 5)  Increase Benicar HCT to 40/25mg  by mouth once  daily. You will need to start new prescription. 6)  Your physician recommends that you go to the [facility] for lab work: DO TODAY. Prescriptions: BENICAR HCT 40-25 MG TABS (OLMESARTAN MEDOXOMIL-HCTZ) Take 1 tablet by mouth once a day  #30 x 6   Entered by:   Cyril Loosen, RN, BSN   Authorized by:   Nelida Meuse, PA-C   Signed by:   Cyril Loosen, RN, BSN on 08/28/2010   Method used:   Electronically to        [facility]  [address] [address] [id number] (retail)       [address]       [place], [place]  [address]       Ph: [phone] or [phone]       Fax: [phone]   RxID:   [id number] DEMADEX 20 MG TABS (TORSEMIDE) Take 1 tablet by mouth once a day  #30 x 6   Entered by:   Cyril Loosen, RN, BSN   Authorized by:   Nelida Meuse, PA-C   Signed by:   Cyril Loosen, RN, BSN on 08/28/2010   Method used:   Electronically to        [facility]  [address] [id number] (retail)       [address]       [place]  [place], [place]  [address]       Ph: [phone] or [phone]       Fax: [phone]   RxID:   [id number]   Handout requested.  I have reviewed and approved all prescriptions at the time of the office visit. Nelida Meuse, PA-C  August 28, 2010 1:43 PM

## 2010-12-08 NOTE — Progress Notes (Signed)
Summary: abnormal stress test  ---- Converted from flag ---- ---- 08/09/2010 3:45 PM, Lewayne Bunting, MD, [facility] wrote: I have talked to patient in the hospital.  She is willing to proceed with cardiac catheterization.  need blood work and also confirmed that she does not have contrast dye allergy.  Patient can be scheduled for left heart catheterization in the JV catheter lab  ---- 08/06/2010 5:14 PM, Hoover Brunette, LPN wrote: Abnormal stress test.  Needs JV cath, can be done on Monday.  Notify pt if see in hospital tomorrow. ------------------------------

## 2010-12-08 NOTE — Assessment & Plan Note (Signed)
Summary: [facility] POST [facility]   Visit Type:  Follow-up Primary Provider:  Sherril Croon  CC:  follow-up visit.  History of Present Illness: the patient is a 55 year old female with a history of moderate mitral regurgitation secondary to mitral valve prolapse and chronic dyspnea.  The patient had a transesophageal echocardiogram done in January.  Based on these findings the patient did not meet criteria for mitral valve repair.  Her ejection fraction was also 60%.  The patient dyspnea is multifactorial.  She also has anemia and is scheduled for colonoscopy.  She reports to me that she still becoming more anemic.  She reported generalized aches and pains.  She reports diffuse arthralgias.  She also has a history of diastolic heart failure and has increased in weight again.  She also has more edema.  Clinical Review Panels:  Echocardiogram Echocardiogram left ventricular chamber size is normal mild concentric left ventricular hypertrophy LV systolic function within normal limits with an ejection fraction of 55 to 60% left atrium is mild to moderately dilated the right surgical global systolic function is normal the right atrium is mildly dilated mild to moderate mitral regurgitation mild tricuspid regurgitation right ventricle systolic pressure is 39 mm of mercury. (02/13/2009)    Preventive Screening-Counseling & Management  Alcohol-Tobacco     Smoking Status: quit     Year Quit: 1997  Current Medications (verified): 1)  Aspirin 81 Mg Tbec (Aspirin) .... Take 1 Tablet By Mouth Once A Day 2)  Benicar Hct 40-12.5 Mg Tabs (Olmesartan Medoxomil-Hctz) .... Take 1 Tablet By Mouth Every Morning 3)  Furosemide 80 Mg Tabs (Furosemide) .... Take 1 Tablet By Mouth Two Times A Day 4)  Lopressor 100 Mg Tabs (Metoprolol Tartrate) .... Take 1 Tablet By Mouth Two Times A Day 5)  Protonix 40 Mg Tbec (Pantoprazole Sodium) .... Take 1 Tablet By Mouth Once A Day 6)  Potassium Chloride Crys Cr 20 Meq Cr-Tabs  (Potassium Chloride Crys Cr) .... Take One Tablet By Mouth Twice A Day 7)  Nitroglycerin 0.4 Mg Subl (Nitroglycerin) .... One Tablet Under Tongue Every 5 Minutes As Needed For Chest Pain---May Repeat Times Three 8)  Trazodone Hcl 50 Mg Tabs (Trazodone Hcl) .... 1/2 - 1 Tab By Mouth At Bedtime For Insomnia 9)  Levbid 0.375 Mg Xr12h-Tab (Hyoscyamine Sulfate) .... Take 1 Tablet By Mouth Two Times A Day 10)  Hydrocodone-Acetaminophen 10-500 Mg Tabs (Hydrocodone-Acetaminophen) .... As Needed  Allergies (verified): No Known Drug Allergies  Comments:  Nurse/Medical Assistant: The patient's medications and allergies were reviewed with the patient and were updated in the Medication and Allergy Lists. Verbally gave names.  Past History:  Past Medical History: Last updated: 09/21/2009 MITRAL REGURGITATION (ICD-396.3) ATRIAL FIBRILLATION (ICD-427.31) HYPERTENSION, UNSPECIFIED (ICD-401.9) Degenerative disk disease.  Insomnia 1. Paroxysmal atrial fibrillation, stable without recurrence. 2. Moderate mitral regurgitation followed by yearly echocardiography. 3. History of volume overload diastolic heart failure stable. 4. Hypertension. 5. Degenerative disk disease. 6. Insomnia.  Past Surgical History: Last updated: 07/04/2009 Abdominal Hysterectomy-Total Appendectomy Cholecystectomy Knee Arthroplasty-Total  Family History: Last updated: 07/04/2009 Family History of Cancer:  Family History of CVA or Stroke:  Family History of Diabetes:  Family History of Hyperlipidemia:  Family History of Hypertension:   Social History: Last updated: 07/04/2009 Full Time Single  Tobacco Use - Former.  Alcohol Use - no Regular Exercise - yes Drug Use - no  Risk Factors: Exercise: yes (07/04/2009)  Risk Factors: Smoking Status: quit (12/26/2009)  Review of Systems  The patient complains of fatigue, weight gain/loss, palpitations, shortness of breath, and muscle weakness.  The patient  denies malaise, fever, vision loss, decreased hearing, hoarseness, chest pain, prolonged cough, wheezing, sleep apnea, coughing up blood, abdominal pain, blood in stool, nausea, vomiting, diarrhea, heartburn, incontinence, blood in urine, joint pain, leg swelling, rash, skin lesions, headache, fainting, dizziness, depression, anxiety, enlarged lymph nodes, easy bruising or bleeding, and environmental allergies.    Vital Signs:  Patient profile:   55 year old female Height:      69 inches Weight:      245 pounds Pulse rate:   73 / minute BP sitting:   119 / 72  (left arm) Cuff size:   large  Vitals Entered By: Carlye Grippe (December 26, 2009 11:03 AM) CC: follow-up visit   Physical Exam  Additional Exam:  General: Well-developed, well-nourished in no distress head: Normocephalic and atraumatic eyes PERRLA/EOMI intact, conjunctiva and lids normal nose: No deformity or lesions mouth normal dentition, normal posterior pharynx neck: Supple, no JVD.  No masses, thyromegaly or abnormal cervical nodes lungs: Normal breath sounds bilaterally without wheezing.  Normal percussion heart: regular rate and rhythm with normal S1 and S2, no S3 or S4.  PMI is normal.  2/6 holosystolic murmur at the apex. abdomen: Normal bowel sounds, abdomen is soft and nontender without masses, organomegaly or hernias noted.  No hepatosplenomegaly musculoskeletal: Back normal, normal gait muscle strength and tone normal pulsus: Pulse is normal in all 4 extremities Extremities: 1+ peripheral pitting edema neurologic: Alert and oriented x 3 skin: Intact without lesions or rashes cervical nodes: No significant adenopathy psychologic: Normal affect    Impression & Recommendations:  Problem # 1:  SHORTNESS OF BREATH (ICD-786.05) the patient has multifactorial dyspnea.  Her mitral regurgitation is not severe.  There was also no significant pulmonary hypertension.  She does not meet criteria for mitral valve  surgery.  She also has diastolic dysfunction contributing to her dyspnea. Her updated medication list for this problem includes:    Aspirin 81 Mg Tbec (Aspirin) .Marland Kitchen... Take 1 tablet by mouth once a day    Benicar Hct 40-12.5 Mg Tabs (Olmesartan medoxomil-hctz) .Marland Kitchen... Take 1 tablet by mouth every morning    Furosemide 80 Mg Tabs (Furosemide) .Marland Kitchen... Take 1 tablet by mouth two times a day    Lopressor 100 Mg Tabs (Metoprolol tartrate) .Marland Kitchen... Take 1 tablet by mouth two times a day  Orders: T-Basic Metabolic Panel [id number])  Problem # 2:  ACUTE ON CHRONIC DIASTOLIC HEART FAILURE (ICD-428.33) the patient has evidence of increased volume overload.  I recommended to her to increase her Lasix to 80 mg p.o. t.i.d. x 5 days then to resume back to b.i.d. dosing and an SMA-7 will be obtained in one week. Her updated medication list for this problem includes:    Aspirin 81 Mg Tbec (Aspirin) .Marland Kitchen... Take 1 tablet by mouth once a day    Benicar Hct 40-12.5 Mg Tabs (Olmesartan medoxomil-hctz) .Marland Kitchen... Take 1 tablet by mouth every morning    Furosemide 80 Mg Tabs (Furosemide) .Marland Kitchen... Take 1 tablet by mouth two times a day    Lopressor 100 Mg Tabs (Metoprolol tartrate) .Marland Kitchen... Take 1 tablet by mouth two times a day    Nitroglycerin 0.4 Mg Subl (Nitroglycerin) ..... One tablet under tongue every 5 minutes as needed for chest pain---may repeat times three  Orders: T-Basic Metabolic Panel ([id number])  Problem # 3:  ATRIAL FIBRILLATION (ICD-427.31) the patient has no recurrence of  atrial fibrillation.  She reports no significant palpitations Her updated medication list for this problem includes:    Aspirin 81 Mg Tbec (Aspirin) .Marland Kitchen... Take 1 tablet by mouth once a day    Lopressor 100 Mg Tabs (Metoprolol tartrate) .Marland Kitchen... Take 1 tablet by mouth two times a day  Orders: T-Basic Metabolic Panel [id number])  Problem # 4:  HYPERTENSION, UNSPECIFIED (ICD-401.9) Assessment: Comment Only  Her updated medication list for  this problem includes:    Aspirin 81 Mg Tbec (Aspirin) .Marland Kitchen... Take 1 tablet by mouth once a day    Benicar Hct 40-12.5 Mg Tabs (Olmesartan medoxomil-hctz) .Marland Kitchen... Take 1 tablet by mouth every morning    Furosemide 80 Mg Tabs (Furosemide) .Marland Kitchen... Take 1 tablet by mouth two times a day    Lopressor 100 Mg Tabs (Metoprolol tartrate) .Marland Kitchen... Take 1 tablet by mouth two times a day  Orders: T-Basic Metabolic Panel [id number])  Patient Instructions: 1)  Increase Lasix to 80mg  three times a day x 5 days, then two times a day  2)  Labs in one week.   3)  Follow up in  6 months.

## 2010-12-08 NOTE — Miscellaneous (Signed)
Summary: Letter  Clinical Lists Changes      Mrs. Toni [name] is a 55 year old female followed for mitral regurgitation and chronic dyspnea. The patient is limited in her exercise tolerance. She has difficulty walking a long distance from the parking lot to the hospital. I strongly suggest that she would be considered for a priority parking on campus close to the hospital given her medical condition.  If you've any questions regarding this request please contact me directly at her office.  Toni Bunting, MD, [facility]  August 17, 2010 12:01 PM    Patient notified letter ready.  Will pick up at [facility] on 10/21.   Toni Brunette, LPN  August 18, 2010 3:36 PM

## 2010-12-10 NOTE — Miscellaneous (Signed)
Summary: Orders Update - bmet/bnp  Clinical Lists Changes  Orders: Added new Test order of T-Basic Metabolic Panel [phone]) - Signed Added new Test order of T-BNP  (B Natriuretic Peptide) [phone]) - Signed

## 2010-12-10 NOTE — Letter (Signed)
Summary: Engineer, materials at [facility]  [address]   [place], [place] [address]   Phone: [phone]  Fax: [phone]        October 19, 2010 MRN: [id number]   Toni [name] [address] [place], [place]  [address]   Dear Ms. Thurmond Butts,  Your test ordered by Selena Batten has been reviewed by your physician (or physician assistant) and was found to be normal or stable. Your physician (or physician assistant) felt no changes were needed at this time.  ____ Echocardiogram  ____ Cardiac Stress Test  __X__ Lab Work - Potassium  improved slightly to 3.5, kidney function stable, BNP up slightly to 160.  Per Dr. Andee Lineman, continue current medications and repeat labs in 2 weeks (BMET/BNP)  ____ Peripheral vascular study of arms, legs or neck  ____ CT scan or X-ray  ____ Lung or Breathing test  ____ Other:   Thank you.   Hoover Brunette, LPN    Duane Boston, M.D., F.A.C.C. Thressa Sheller, M.D., F.A.C.C. Oneal Grout, M.D., F.A.C.C. Cheree Ditto, M.D., F.A.C.C. Daiva Nakayama, M.D., F.A.C.C. Kenney Houseman, M.D., F.A.C.C. Jeanne Ivan, PA-C

## 2010-12-31 ENCOUNTER — Encounter: Payer: Self-pay | Admitting: Cardiology

## 2011-01-04 ENCOUNTER — Encounter: Payer: Self-pay | Admitting: Cardiology

## 2011-01-18 ENCOUNTER — Encounter: Payer: Self-pay | Admitting: Cardiology

## 2011-01-18 ENCOUNTER — Ambulatory Visit (INDEPENDENT_AMBULATORY_CARE_PROVIDER_SITE_OTHER): Payer: PRIVATE HEALTH INSURANCE | Admitting: Cardiology

## 2011-01-18 DIAGNOSIS — R609 Edema, unspecified: Secondary | ICD-10-CM | POA: Insufficient documentation

## 2011-01-18 DIAGNOSIS — I059 Rheumatic mitral valve disease, unspecified: Secondary | ICD-10-CM

## 2011-01-21 LAB — BASIC METABOLIC PANEL
BUN: 19 mg/dL (ref 6–23)
BUN: 22 mg/dL (ref 6–23)
CO2: 26 mEq/L (ref 19–32)
CO2: 28 mEq/L (ref 19–32)
Calcium: 8.6 mg/dL (ref 8.4–10.5)
Calcium: 9.6 mg/dL (ref 8.4–10.5)
Chloride: 107 mEq/L (ref 96–112)
Chloride: 107 mEq/L (ref 96–112)
Creatinine, Ser: 0.95 mg/dL (ref 0.4–1.2)
Creatinine, Ser: 1.04 mg/dL (ref 0.4–1.2)
GFR calc Af Amer: >60 mL/min (ref >60)
GFR calc Af Amer: >60 mL/min (ref >60)
GFR calc non Af Amer: 55 mL/min — ABNORMAL LOW (ref >60)
GFR calc non Af Amer: >60 mL/min (ref >60)
Glucose, Bld: 111 mg/dL — ABNORMAL HIGH (ref 70–99)
Glucose, Bld: 85 mg/dL (ref 70–99)
Potassium: 3.1 mEq/L — ABNORMAL LOW (ref 3.5–5.1)
Potassium: 4 mEq/L (ref 3.5–5.1)
Sodium: 139 mEq/L (ref 135–145)
Sodium: 141 mEq/L (ref 135–145)

## 2011-01-21 LAB — CBC
HCT: 30.8 % — ABNORMAL LOW (ref 36.0–46.0)
HCT: 34.6 % — ABNORMAL LOW (ref 36.0–46.0)
Hemoglobin: 10 g/dL — ABNORMAL LOW (ref 12.0–15.0)
Hemoglobin: 11.3 g/dL — ABNORMAL LOW (ref 12.0–15.0)
MCH: 30 pg (ref 26.0–34.0)
MCH: 30.2 pg (ref 26.0–34.0)
MCHC: 32.5 g/dL (ref 30.0–36.0)
MCHC: 32.7 g/dL (ref 30.0–36.0)
MCV: 92.5 fL (ref 78.0–100.0)
MCV: 92.5 fL (ref 78.0–100.0)
Platelets: 175 10*3/uL (ref 150–400)
Platelets: 199 10*3/uL (ref 150–400)
RBC: 3.33 MIL/uL — ABNORMAL LOW (ref 3.87–5.11)
RBC: 3.74 MIL/uL — ABNORMAL LOW (ref 3.87–5.11)
RDW: 13.2 % (ref 11.5–15.5)
RDW: 13.3 % (ref 11.5–15.5)
WBC: 4.9 10*3/uL (ref 4.0–10.5)
WBC: 5.2 10*3/uL (ref 4.0–10.5)

## 2011-01-21 LAB — POCT I-STAT 3, VENOUS BLOOD GAS (G3P V)
Bicarbonate: 25.7 mEq/L — ABNORMAL HIGH (ref 20.0–24.0)
O2 Saturation: 66 %
TCO2: 27 mmol/L (ref 0–100)
pCO2, Ven: 44.5 mmHg — ABNORMAL LOW (ref 45.0–50.0)
pH, Ven: 7.37 — ABNORMAL HIGH (ref 7.250–7.300)
pO2, Ven: 35 mmHg (ref 30.0–45.0)

## 2011-01-21 LAB — POCT I-STAT 3, ART BLOOD GAS (G3+)
Acid-Base Excess: 1 mmol/L (ref 0.0–2.0)
Bicarbonate: 26.3 mEq/L — ABNORMAL HIGH (ref 20.0–24.0)
O2 Saturation: 93 %
TCO2: 28 mmol/L (ref 0–100)
pCO2 arterial: 41.9 mmHg (ref 35.0–45.0)
pH, Arterial: 7.407 — ABNORMAL HIGH (ref 7.350–7.400)
pO2, Arterial: 67 mmHg — ABNORMAL LOW (ref 80.0–100.0)

## 2011-01-21 LAB — PROTIME-INR
INR: 0.93 (ref 0.00–1.49)
Prothrombin Time: 12.7 seconds (ref 11.6–15.2)

## 2011-01-26 NOTE — Assessment & Plan Note (Signed)
Summary: 3 MO FU PER FEB REMINDER  PCP [facility]   [facility] -[facility]   Visit Type:  Follow-up Primary Provider:  Dr. Sherril Croon   History of Present Illness: recent blood work showed a creatinine of 1.28 and a BUN of 36.  LDL cholesterol was 89 HDL cholesterol 36.  TSH was slightly low at 0 .30.  Hemoglobin  A1c was 5.7.  Blood count showed mild anemia with hemoglobin of 10.5 which appear to be normocytic. Problems before this the patient had a potassium of 3.5 a BNP of 160 and labs were otherwise stable. The patient has a history of paroxysmal fibrillation and moderate mitral regurgitation by echocardiography interpreted by Dr. Jens Som in April 2011.Toni [name]  She has a history of hypertension, but normal coronary arteries by catheterization in October of 2011. By cardiac catheterization the patient lives regurgitation was not very significant. Chest chronic diastolic heart failure and chronic peripheral edema.  The patient has been maintaining normal sinus rhythm, her CHAD2 score is one. She complains of chronic fatigue.  She feels weak.  This has been a long-standing problem.  However this weekend she viral illness and had nausea and vomiting.  This has resolved.  She also stated that when she is not up on her feet most of the day she has no edema in her legs.  She reports no palpitations. A bedside echocardiogram was performed today.  The patient has normal LV function.  There was normal RV function.  Mitral valve appears structurally intact.  There was only mild eccentric mitral regurgitation. The patient also brought to my attention that she has lost quite a bit of weight since her last office visit.  I encouraged her to continue to try to lose some more weight and exercise. 12-lead electrocardiogram was reviewed.  Normal sinus rhythm nonspecific ST-T wave changes.  Otherwise normal tracing.   Preventive Screening-Counseling & Management  Alcohol-Tobacco     Smoking Status: quit     Year Quit: 1997  Current  Medications (verified): 1)  Aspirin 81 Mg Tbec (Aspirin) .... Take 1 Tablet By Mouth Once A Day 2)  Benicar Hct 40-25 Mg Tabs (Olmesartan Medoxomil-Hctz) .... Take 1 Tablet By Mouth Once A Day 3)  Metoprolol Tartrate 50 Mg Tabs (Metoprolol Tartrate) .... Take One Tablet By Mouth Twice A Day 4)  Protonix 40 Mg Tbec (Pantoprazole Sodium) .... Take 1 Tablet By Mouth Once A Day 5)  Potassium Chloride Crys Cr 20 Meq Cr-Tabs (Potassium Chloride Crys Cr) .... Take 2 Tablet By Mouth Daily 6)  Nitroglycerin 0.4 Mg Subl (Nitroglycerin) .... One Tablet Under Tongue Every 5 Minutes As Needed For Chest Pain---May Repeat Times Three 7)  Levbid 0.375 Mg Xr12h-Tab (Hyoscyamine Sulfate) .... Take 1 Tablet By Mouth Two Times A Day 8)  Hydrocodone-Acetaminophen 7.5-500 Mg Tabs (Hydrocodone-Acetaminophen) .... As Needed 9)  Indomethacin 25 Mg Caps (Indomethacin) .... Take 1 Tablet By Mouth Two Times A Day As Needed 10)  Demadex 20 Mg Tabs (Torsemide) .... Take 1 Tablet By Mouth Once A Day 11)  Dramamine 50 Mg Tabs (Dimenhydrinate) .... Use As Directed 12)  Diphenhydramine Hcl 25 Mg Caps (Diphenhydramine Hcl) .... As Needed  Allergies (verified): 1)  ! * Ivp Dye  Comments:  Nurse/Medical Assistant: The patient's medication bottles and allergies were reviewed with the patient and were updated in the Medication and Allergy Lists.  Past History:  Past Medical History: MITRAL REGURGITATION (ICD-396.3) ATRIAL FIBRILLATION (ICD-427.31) HYPERTENSION, UNSPECIFIED (ICD-401.9) Degenerative disk disease.  Insomnia 1.  Paroxysmal atrial fibrillation, stable without recurrence. 2. Moderate mitral regurgitation followed by yearly echocardiography. 3. History of volume overload diastolic heart failure stable. 4. Hypertension. 5. Degenerative disk disease. 6. Insomnia. Normal coronary arteries, 10/11 Diastolic heart failure Chronic peripheral edema normocytic anemia stage II chronic kidney disease  Review of  Systems       The patient complains of fatigue and shortness of breath.  The patient denies malaise, fever, weight gain/loss, vision loss, decreased hearing, hoarseness, chest pain, palpitations, prolonged cough, wheezing, sleep apnea, coughing up blood, abdominal pain, blood in stool, nausea, vomiting, diarrhea, heartburn, incontinence, blood in urine, muscle weakness, joint pain, leg swelling, rash, skin lesions, headache, fainting, dizziness, depression, anxiety, enlarged lymph nodes, easy bruising or bleeding, and environmental allergies.    Vital Signs:  Patient profile:   55 year old female Height:      69 inches Weight:      237 pounds Pulse rate:   73 / minute BP sitting:   104 / 70  (left arm) Cuff size:   large  Vitals Entered By: Carlye Grippe (January 18, 2011 9:49 AM)  Physical Exam  Additional Exam:  GEN: 55 year old female, moderately obese, sitting upright, no distress HEENT: NCAT,PERRLA,EOMI NECK: palpable pulses, no bruits; no JVD; no TM LUNGS: CTA bilaterally HEART: RRR (S1S2); no significant murmurs; no rubs; no gallops ABD: soft, NT; intact BS EXT: stable right groin, with no hematoma, ecchymosis, or bruit; 1+ bilateral peripheral edema SKIN: warm, dry MUSC: no obvious deformity NEURO: A/O (x3)     Impression & Recommendations:  Problem # 1:  CHRONIC DIASTOLIC HEART FAILURE (ICD-428.32) chronic diastolic heart failure: There is no evidence of fluid overload at the present time.  The patient can continue on her medical regimen.normal coronary arteries: Normal cardiac catheterization in 2001. Her updated medication list for this problem includes:    Aspirin 81 Mg Tbec (Aspirin) .Toni [name]... Take 1 tablet by mouth once a day    Benicar Hct 40-25 Mg Tabs (Olmesartan medoxomil-hctz) .Toni [name]... Take 1 tablet by mouth once a day    Metoprolol Tartrate 50 Mg Tabs (Metoprolol tartrate) .Toni [name]... Take one tablet by mouth twice a day    Nitroglycerin 0.4 Mg Subl (Nitroglycerin) .....  One tablet under tongue every 5 minutes as needed for chest pain---may repeat times three    Demadex 20 Mg Tabs (Torsemide) .Toni [name]... Take 1 tablet by mouth once a day  Problem # 2:  MITRAL REGURGITATION (ICD-396.3) mitral regurgitation: Discrepant results by ventriculogram and echocardiogram.  On physical examination the patient regurgitation does not appear to be severe.  We will repeat a bedside echocardiogram in the office today to reassess her mitral regurgitation.  However I do not think this is contributing to the patient's symptoms.   Problem # 3:  EDEMA (ICD-782.3) peripheral edema : this appears to be largely dependent lower extremity edema.  No definite evidence of fluid overload.  Problem # 4:  ATRIAL FIBRILLATION (ICD-427.31) paroxysmal atrial fibrillation: the patient remained in normal sinus rhythm.  I reviewed her electrocardiogram. Her updated medication list for this problem includes:    Aspirin 81 Mg Tbec (Aspirin) .Toni [name]... Take 1 tablet by mouth once a day    Metoprolol Tartrate 50 Mg Tabs (Metoprolol tartrate) .Toni [name]... Take one tablet by mouth twice a day  Orders: EKG w/ Interpretation (93000)  Patient Instructions: 1)  Your physician wants you to follow-up in: 6 months. You will receive a reminder letter in the mail one-two months in advance. If  you don't receive a letter, please call our office to schedule the follow-up appointment. 2)  Your physician recommends that you continue on your current medications as directed. Please refer to the Current Medication list given to you today.

## 2011-01-27 ENCOUNTER — Ambulatory Visit (INDEPENDENT_AMBULATORY_CARE_PROVIDER_SITE_OTHER): Payer: Self-pay | Admitting: Internal Medicine

## 2011-03-23 NOTE — Assessment & Plan Note (Signed)
[facility]                          [facility]ICE NOTE   Toni [name], Toni [name]                       MRN:          [id number]  DATE:06/24/2008                            DOB:          12-02-1955    CARDIOLOGIST:  [name], MD,FACC   PRIMARY CARE PHYSICIAN:  Doreen Beam, MD.   REASON FOR VISIT:  Three-month followup.   HISTORY OF PRESENT ILLNESS:  Toni [name] is a 55 year old female patient  who works as a Agricultural engineer on the third floor at [employer]  with a history of moderate mitral regurgitation and recently-diagnosed  paroxysmal atrial fibrillation.  As noted in my prior note, I discussed  her case with Dr. Diona Browner, and we decided that her CHADS score was  close to 1, and she has only been treated with aspirin daily.  She  presents for followup today.  Since I last saw her, she had a relook  echocardiogram.  This revealed an EF of 55-60%.  Her mitral  regurgitation was rated as moderate.  She had posteriorly-directed  mitral regurgitant jet.  Her RV systolic pressure was rated at 52 mmHg.  In the office today, she notes she is doing well.  She continues to note  dyspnea with exertion.  She describes NYHA class II symptoms.  She has  some chest discomfort with positional changes.  This is mainly with  leaning forward.  She denies exertional chest, arm, or jaw discomfort.  She denies paroxysmal nocturnal dyspnea or increased pedal edema.  She  does sleep on 2-3 pillows, and this is chronic without change.  She  denies syncope.  She continues to note palpitations from time to time.  They seem to be brief.  She denies any associated syncope or near-  syncope.   MEDICATIONS:  Levsinex 0.375 mg b.i.d., Protonix 40 mg daily, Lopressor  100 mg b.i.d., Benicar HCTZ 40/12.5 mg daily,  Ogen 0.625 mg daily, Lasix 40 mg b.i.d., K-Dur 10 mEq b.i.d., Aspirin  325 mg daily, Cyclobenzaprine 10 mg nightly.   PHYSICAL EXAMINATION:   GENERAL:  She is a well-nourished, well-developed  female in no acute distress.  VITAL SIGNS:  Blood pressure is 153/97, pulse 51, and weight 246 pounds.  HEENT:  Normal.  NECK:  Without JVD.  CARDIAC:  Normal S1 and S2. Regular rate and rhythm.  LUNGS:  Clear to auscultation bilaterally.  ABDOMEN:  Soft and nontender.  EXTREMITIES:  Without edema.  Calves are nontender.  SKIN:  Warm and dry.  NEUROLOGIC:  She is alert and oriented x3.  Cranial nerves II-XII are  grossly intact.   ASSESSMENT AND PLAN:  1. Paroxysmal atrial fibrillation with history of continued brief      palpitations.  The patient continues to experience some      palpitations and would likely benefit from higher-dose beta-blocker      therapy.  Her blood pressure could also tolerate this.  We will      increase her Lopressor to 150 mg in the morning and 100 mg in the      evening.  If she continues to have symptoms in the future, we may      want to repeat an event monitor to see how much atrial fibrillation      she is having and consider antiarrhythmic therapy at that time if      it continues to be bothersome for her.  Otherwise, we will review      her symptoms when she returns for followup.  2. Mitral regurgitation.  Recent echocardiogram rates this as      moderate.  As noted previously, she was seen by Dr. Regino Schultze at [facility]      after her initial presentation.  It was felt at that time that her      mitral regurgitation was not as severe as it was initially felt to      be.  This is stable, and she can have followup echocardiogram in      the future.  3. History of congestive heart failure in the setting of mitral      regurgitation.  This is stable, and she seems to be optivolemic on      exam today.  We will check a BMET as she continues on Lasix and      potassium therapy.  4. Hypertension.  As noted above, the patient's blood      pressuremedications will be adjusted.  5. Degenerative disk disease.  The  patient notes several symptoms of      left arm numbness and also cervical pain.  I have asked her to      follow up with Dr. Sherril Croon regarding this.   DISPOSITION:  The patient will be brought back in followup in the next 6  months with Dr. [name], MD,FACC or sooner p.r.n.      [name], PA-C  Electronically Signed      Madolyn Frieze. Jens Som, MD, [facility]  Electronically Signed   SW/MedQ  DD: 06/24/2008  DT: 06/25/2008  Job #: [id number]   cc:   Doreen Beam, MD

## 2011-03-23 NOTE — Assessment & Plan Note (Signed)
[facility]                          [facility]ICE NOTE   Toni [name], Toni [name]                       MRN:          [id number]  DATE:01/18/2008                            DOB:          November 12, 1955    PRIMARY CARDIOLOGIST:  [name], M.D.   PRIMARY CARE PHYSICIAN:  Doreen Beam, M.D.   REASON FOR VISIT:  Six month followup.   HISTORY OF PRESENT ILLNESS:  Toni [name] is a 55 year old female patient  with history of moderate mitral regurgitation.  She had initially  presented with new onset congestive heart failure and it was felt that  she may have severe mitral regurgitation.  However, repeat  echocardiogram by Dr. Regino Schultze at [facility] revealed only moderate mitral  regurgitation.  She did have a heart catheterization done in March of  2008.  This revealed only mild luminal irregularities and no significant  flow limiting coronary artery disease in the major epicardial vessels.  She was last seen by Dr. Andee Lineman at [facility] in September of  2008 when she presented with chest pain.  She ruled out for myocardial  infarction by enzymes.  She was set up for re-look echocardiogram which  revealed normal left ventricular function with an EF of 60 to 65% and  mild to moderate mitral regurgitation.  She returns to the office today  for followup.   The patient notes that she has had some dyspnea with exertion.  Overall  this has been fairly stable.  She describes NYHA class II symptoms.  She  does sleep on 3 pillows.  This has been fairly chronic.  She notes that  when she lies flat she has chest discomfort.  This is the same  discomfort that we saw her for in the hospital in September of 2008.  She has mild edema that worsens during the day when she is on her feet.  It is better in the mornings after she has been lying supine.  She  denies any paroxysmal nocturnal dyspnea.  She does have occasional  palpitations.  She denies any chest pain with  exertion.   CURRENT MEDICATIONS:  1. Levsinex 0.375 mg b.i.d.  2. Protonix 40 mg daily.  3. Aspirin 81 mg daily.  4. Lopressor 100 mg in the morning, one-half tablet in the evening.  5. Benicar/HCTZ 40/12.5 mg daily.  6. Ogen 0.625 mg daily.  7. Lasix 40 mg b.i.d.  8. K-Dur 10 mEq b.i.d.   ALLERGIES:  No known drug allergies.   PHYSICAL EXAMINATION:  GENERAL:  She is a well-nourished, well-developed  female in no distress.  VITAL SIGNS:  Blood pressure 141/92, pulse 70, weight 245.2 pounds.  HEENT: Normal.  NECK:  Without jugular venous distention.  CARDIAC:  Normal S1, S2.  Regular rate and rhythm.  2/6 holosystolic  murmur heard best at the apex.  LUNGS:  Clear to auscultation bilaterally.  No wheezing, no rhonchi, no  rales.  ABDOMEN:  Soft, nontender with normal active bowel sounds. No  organomegaly.  EXTREMITIES:  Without edema.  Calves are soft, nontender.  NEUROLOGICAL:  She is  alert and oriented x3.  Cranial nerves II-XII are  grossly intact.   Electrocardiogram reveals sinus rhythm with heart rate 68, normal axis,  no acute changes.   IMPRESSION:  1. Mild to moderate mitral regurgitation.  2. History of congestive heart failure in the setting of #1.      a.     New York Heart Association class II symptoms.  3. Essentially  normal coronary arteries by catheterization, March,      2008.  4. Chest pain.      a.     Musculoskeletal chest pain.  5. Rule out hyperthyroidism.      a.     Low TSH (0.28) at laboratory work done December 19, 2007.  6. Hypertension.  7. Gastroesophageal reflux disease.  8. Diffuse pain.      a.     Rule out fibromyalgia.  9. Irritable bowel syndrome.   DISCUSSION:  Toni [name] presents to the office today for followup.  From  a heart failure standpoint she seems to be fairly opti-volemic.  She did  have an echocardiogram about six months ago that revealed mild to  moderate mitral regurgitation.  She complains of a lot of diffuse pain.   I question whether or not she may have fibromyalgia.  She also had a low  TSH by recent laboratory work done for employment purposes.  She also  tells me she has had some palpitations in the past.  Of note, she had  mild to moderately dilated left atrium and mildly dilated right atrium  at her recent echocardiogram. She is certainly at risk for atrial  fibrillation. Currently her CHADS 2 score is 2.  She is currently on  aspirin therapy.   PLAN:  1. Will get some blood today to include a BMET, BNP, TSH, free T4, and      free T3.  2. I have asked her to follow up with her primary care physician for      her low TSH.  If she truly has hyperthyroidism this may account for      some of her palpitations.  If her thyroid work up is essentially      negative then we may want to consider proceeding with CardioNet      event monitor to rule out atrial fibrillation.  As noted above, she      would be a candidate for Coumadin.  3. Will set up for a repeat echocardiogram to reassess her mitral      regurgitation.  4. Will keep a close eye on her blood pressure  and adjust her      medications as needed.  5. Will bring her back in followup in the next three months.  She      knows to return sooner prn.  6. I have also asked her to follow up with Dr. Sherril Croon for her diffuse      pain to rule out the possibility of fibromyalgia.      [name], PA-C  Electronically Signed      [name], MD,FACC  Electronically Signed   SW/MedQ  DD: 01/18/2008  DT: 01/19/2008  Job #: [id number]   cc:   Doreen Beam, MD

## 2011-03-23 NOTE — Assessment & Plan Note (Signed)
[name]                          Toni [name]   [name], [name]                       MRN:          [id number]  DATE:03/28/2008                            DOB:          10-08-1956    PRIMARY CARDIOLOGIST:  [name], MD.   PRIMARY CARE PHYSICIAN:  Doreen Beam, MD.   REASON FOR VISIT:  A 2 month followup.   HISTORY OF PRESENT ILLNESS:  Toni [name] is a 55 year old female patient  with a history of moderate mitral regurgitation.  Please see my [name]  from January 18, 2008 for complete details.  Briefly, the patient  complained of some palpitations.  She had a recent TSH that was low.  We  set her up for follow up echocardiogram as well as a CardioNet event  monitor and some follow up blood work.   Her repeat TSH was 0.51 and her thyroxine was 7.6.  She has since seen  her primary care physician who has done a repeat thyroid function study.  Per her report, this was normal.   The patient's echocardiogram returned with an EF of 55-60%.  She had  mild-moderate mitral regurgitation.  Her MR was essentially unchanged.   Her CardioNet monitor did reveal episodes of paroxysmal atrial  fibrillation.  She also had some episodes of wide complex tachy  arrhythmia.  Some of these were probably supraventricular arrhythmia  with aberrancy.  Some of these were possibly nonsustained ventricular  tachycardia.  However as noted, she had normal LV function.  The  nonsustained ventricular tachycardia was no more than 4-5 beats.  Her  episodes of paroxysmal atrial fibrillation were essentially  asymptomatic.  She did have PVCs, ventricular bigeminy as well as  ventricular trigeminy.  These were symptomatic.   Since being seen in March 2009, she has been diagnosed with lumbar  degenerative disk disease.  She has some right lower extremity radicular  symptoms.  She is currently on Lyrica which is making her dizzy.  She is  also seeing physical  therapy.  She continues to have some back pain.   She denies any intercurrent development of chest pain or significant  shortness of breath.  She denies orthopnea or PND.  She denies any  syncope.  She does have occasional pedal edema.  She feels somewhat  bloated in her abdomen as well.   The patient does [name] a long history of vaginal spotting.  She is status  post total abdominal hysterectomy.  She has been evaluated by gynecology  in the past since she started spotting.  The reasons for her spotting  are not clear.  She was asked to increase her aspirin from 81-325 mg  once her monitor began to show atrial fibrillation.  She has not noticed  a significant change in her spotting since that time.   CURRENT MEDICATIONS:  1. Levsinex 0.375 mg b.i.d.  2. Protonix 40 mg daily.  3. Lopressor m100 mg in the morning and half tablet in the evening.  4. Benicar/HCTZ 40/12.5 mg daily.  5. Ogen 0.625 mg daily.  6. Lasix  40 mg b.i.d.  7. K-Dur 10 mEq b.i.d.  8. Aspirin 325 mg daily.  9. Lyrica 25 mg daily.  10.Metanx b.i.d.   PHYSICAL EXAMINATION:  GENERAL:  She is a well-nourished well-developed  female in no distress.  VITAL SIGNS:  Blood pressure 135/78, pulse 64, weight 250.4 pounds.  This is up from 245.2 pounds at her last visit.  HEENT:  Normal.  NECK:  Without JVD.  CARDIAC:  Normal S1 and S2.  Regular rate and rhythm with a 2/6 systolic  ejection murmur heard best at the apex.  LUNGS:  Clear to auscultation bilaterally.  ABDOMEN:  Soft, nontender.  EXTREMITIES:  Trace edema bilaterally.  NEUROLOGIC:  She is alert and oriented x3.  Cranial nerves II-XII are  grossly intact.   IMPRESSION:  1. Paroxysmal atrial fibrillation.  2. Mild-moderate mitral regurgitation.  3. History of congestive heart failure in the setting of mitral      regurgitation  4. Normal coronaries by catheterization March 2008.  5. Abnormal thyroid function studies.  Followed by Dr. Sherril Croon.  6.  Hypertension.  7. Degenerative disk disease.   PLAN:  1. The patient has had evidence of paroxysmal atrial fibrillation on      her recent monitor.  This is asymptomatic.  Her episodes were      brief.  I discussed her case further with Dr. Diona Browner.  At this      point time, we think that her CHADS 2 score is closer to 1.  She      did have a history of heart failure, but she has normal LV      function.  At this point in time, we think it is best to continue      aspirin therapy only.  This can be reassessed.  2. She did go somewhat fast when she was in AFib.  I have asked her to      increase her Lopressor to 100 mg b.i.d.  3. She will continue to follow up with Dr. Sherril Croon for her other medical      problems.  4. I have asked the patient to return in followup in 3 months to see      Dr. Andee Lineman.  5. She will have a follow up echocardiogram in September of this year      to reassess her mitral regurgitation.      [name], PA-C  Electronically Signed      [name], MD  Electronically Signed   SW/MedQ  DD: 03/28/2008  DT: 03/28/2008  Job #: [id number]   cc:   Doreen Beam, MD

## 2011-03-23 NOTE — Assessment & Plan Note (Signed)
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  The patient has a  history of paroxysmal atrial fibrillation and treated with aspirin due  to a CHADS score of 1.  She also has mitral regurgitation.  She has a  history of volume overload and congestive heart failure.  The patient's  weight currently, however, has remained stable and is 6 pounds less than  during the last office visit.  The patient does report shortness of  breath which I suspect is largely due to deconditioning.  She has no  orthopnea or PND.  She does report symptoms of anxiety and possibly even  depression.  She has had no recent blood work.   MEDICATIONS:  1. Protonix 40 mg a day.  2. Benicar/hydrochlorothiazide 40/12.5 p.o. daily.  3. Ogen 0.625 mg p.o. daily.  4. Aspirin 81 mg a day.  5. Lasix 80 mg p.o. b.i.d..  6. K-Dur 20 mg p.o. b.i.d.  7. Lopressor 100 mg one in the morning and one in the p.m.   A 12-lead EKG in the office demonstrates normal sinus rhythm, left  ventricular hypertrophy, and nonspecific ST-T wave changes.   PHYSICAL EXAMINATION:  VITAL SIGNS:  Blood pressure is 127/76, heart  rate 80 beats per minute, weight is 249 pounds, saturation is 98% on  room air.  GENERAL:  Well-nourished, African American female, in no  distress.  HEENT:  Pupils, eyes are clear.  Conjunctivae clear.  NECK:  Supple.  Normal carotid upstroke and no carotid bruits.  LUNGS:  Clear breath sounds bilaterally.  HEART:  Regular rate and rhythm with normal S1 and S2 without S3.  There  is a pathological 3/6 murmur at the apex.  ABDOMEN:  Soft and nontender with no  rebound or guarding.  Good bowel  sounds.  EXTREMITIES:  No cyanosis, clubbing, or edema.  NEURO:  The patient is alert, oriented, and grossly nonfocal.   PROBLEM LIST:  1. Paroxysmal atrial fibrillation, stable without recurrence.  2. Moderate mitral regurgitation followed by yearly echocardiography.  3. History of volume overload diastolic heart failure stable.  4. Hypertension.  5. Degenerative disk disease.  6. Insomnia.   PLAN:  1. The patient's possible symptoms of anxiety and depression, and I      asked her to address this with her primary care physician.  2. She does have significant insomnia and I have given her      prescription in the meanwhile for trazodone.  Again, she can      further discuss this with her primary care physician.  3. From a heart failure standpoint, I suspect the patient is stable.      She is currently taking Lasix 80 mg twice a day  and I have made no      changes.  I did order BMET to make sure that she has no signs of      dehydration and also BNP level.  4. The patient can follow up with [place] in 6 months.      [name], MD,FACC  Electronically Signed    GED/MedQ  DD: 04/07/2009  DT: 04/08/2009  Job #: [id number]   cc:   Doreen Beam, MD

## 2011-03-23 NOTE — Assessment & Plan Note (Signed)
[facility]                          Toni [name]   Toni [name], Toni [name]                       MRN:          [id number]  DATE:02/27/2009                            DOB:          08-Jul-1956    REFERRING PHYSICIAN:  Doreen Beam, MD   REASON FOR OFFICE VISIT:  Routine followup.   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  She has a history of moderate mitral regurgitation and also  paroxysmal atrial fibrillation.  The patient's [place] score is close to 1  and a decision was made to treat her with aspirin and to defer Coumadin.  She has not had much problems in the way of palpitations.  Unfortunately, over the last 2 weeks she states that she has had  increased shortness of breath, both at rest and on minimal exertion.  She also has orthopnea and PND.  She states that she went to [facility] on a bus and was very noncompliant with her diet, eating a salt-  rich diet.  She has gained a significant amount of weight.  Her weight  is up from 246 to 255 since her last office visit here.  She also finds  that she has very little diuresis with her current dose of Lasix.   MEDICATIONS:  1. Protonix 40 mg daily.  2. Lopressor 100 mg 1 in the morning and 1 in the p.m.  3. Benicar/hydrochlorothiazide 40/12.5 mg p.o. daily.  4. Ogen 0.625 mg p.o. daily.  5. Lasix 40 mg p.o. b.i.d.  6. K-Dur 10 mEq 1 tablet p.o. b.i.d.  7. Aspirin 81 p.o. daily.   PHYSICAL EXAMINATION:  VITAL SIGNS:  Blood pressure 147/83, heart rate  70, and weight 255 pounds.  GENERAL:  A well-nourished white female, very puffy appearing.  HEENT:  Pupils are isocoric.  Conjunctivae are clear.  NECK:  Supple.  Normal carotid upstroke and no carotid bruits.  LUNGS:  Clear breath sounds bilaterally.  HEART:  Regular rate and rhythm.  Normal S1 and S2 with an S3 present.  There  is also a pathologic 3/6 murmur at the apex.  ABDOMEN:  Soft,  nontender.  No rebound or guarding.  Good bowel sounds.  EXTREMITIES:  No cyanosis, clubbing, or edema.  NEURO:  The patient is alert, oriented, and grossly nonfocal.   PROBLEM LIST:  1. Paroxysmal atrial fibrillation, stable.  2. Moderate mitral regurgitation, recent followup echo.  3. Volume overload with diastolic heart failure.  4. Hypertension.  5. Degenerative disk disease.   PLAN:  1. The patient is planning to see Dr. Jeral Fruit later today for her back.  2. The patient is clearly volume overloaded likely secondary to      dietary indiscretion and we have increased her Lasix to 80 mg p.o.      b.i.d. as well as her K-Dur to 20 mEq p.o. b.i.d.  3. We gave the patient a refill for nitroglycerin.  4. The patient had a recent echocardiographic study done and  therefore      I do not think this needs to be repeated.  Her mitral regurgitation      was mild to moderate and her ejection fraction was preserved, but      she did have diastolic dysfunction.  5. We will follow up for an RN visit next week and consider IV      diuresis if no good response to higher doses of Lasix.     [name], MD,FACC  Electronically Signed    GED/MedQ  DD: 02/27/2009  DT: 02/28/2009  Job #: [id number]   cc:   Doreen Beam, MD

## 2011-03-26 NOTE — Assessment & Plan Note (Signed)
[facility]                          [facility] NOTE   JOSLYNN, JAMROZ                       MRN:          [id number]  DATE:02/21/2007                            DOB:          23-May-1956    PRESENTATION:  The patient is a 55 year old white female with a history  of new onset congestive heart failure.  The patient was felt to have  significant mitral regurgitation.  The patient however was also  hypertensive.  She underwent a TEE and there was concern of moderately  severe mitral regurgitation and possible need for mitral valve surgery.  She was referred to Dr. Regino Schultze at [facility].  A repeat echo showed only  moderate mitral regurgitation by catheterization.  The patient also had  a lot of mitral regurgitation and no significant coronary artery  disease.  The patient presents now for followup.  She actually has been  doing quite well and blood pressure well controlled.  She has only  dyspnea on moderate exertion.  She is able to do her daily activities  without any restraints.  She is doing quite well.   MEDICATIONS:  1. Protonix 40 mg daily.  2. Aspirin 81 mg daily.  3. Lopressor 25 p.o. daily.  4. Benicar/hydrochlorothiazide 45/12.5 daily.  5. Lasix 40 mg p.o. b.i.d.  6. Premarin.  7. Clindamycin for vaginal bleeding.   PHYSICAL EXAMINATION:  VITAL SIGNS:  Blood pressure 118/81, heart rate  71, weight is 240 pounds.  GENERAL:  A well-nourished African American female in no distress  HEENT:  normal  NECK:  Supple.  Normal carotid upstroke.  No carotid bruits.  LUNGS:  Clear breath sounds bilaterally.  HEART:  Regular rate and rhythm.  Normal S1, S2 with a 3/6 systolic  murmur at the apex.  ABDOMEN:  Soft.  EXTREMITIES:  No cyanosis, clubbing or edema.   PROBLEM:  1. Status post congestive heart failure.      a.     NYH class I to II.      b.     Moderate mitral regurgitation.      c.     Eccentric MR      d.     Left atrial  enlargement.  2. History of chronic murmurs since childhood.  3. No evidence of significant coronary artery disease.   PLAN:  The patient was evaluated by Dr. Regino Schultze at [facility].  It is felt that her mitral regurgitation is only moderate.  Therefore the patient will be followed clinically and treatment will be  to continue on her current medical regimen.  1. V-tech cardiogram will be obtained in 6 months.  2. The patient can resume her work activities and I filled in the      appropriate forms today.  3. Will also draw BMET today to recheck her potassium and BUN,      creatinine.     [name], MD,FACC  Electronically Signed    GED/MedQ  DD: 02/21/2007  DT: 02/21/2007  Job #: [id number]

## 2011-03-26 NOTE — Cardiovascular Report (Signed)
Toni [name], Toni [name] NO.:  [id number]   MEDICAL RECORD NO.:  [id number]          PATIENT TYPE:  OIB   LOCATION:  1963                         FACILITY:  [facility]   PHYSICIAN:  [name], MD DATE OF BIRTH:  19-May-1956   DATE OF PROCEDURE:  01/20/2007  DATE OF DISCHARGE:                            CARDIAC CATHETERIZATION   REQUESTING CARDIOLOGIST:  [name], MD,FACC   INDICATIONS:  Mrs. Mantei is a 55 year old woman with a history of  atypical chest pain as well as recently documented bileaflet mitral  valve prolapse associated with moderate to severe eccentric mitral  regurgitation based on recent transesophageal echocardiogram.  She has  had clinical evidence suggestive of heart failure.  She has a pending  consultation at [facility] to evaluate this problem further plans.  Referred now for a right and left heart catheterization for further  hemodynamic assessment as well as definition of the coronary anatomy.  The potential risks and benefits were explained in advance and informed  consent was obtained.   PROCEDURE PERFORMED:  1. Left heart catheterization.  2. Right heart catheterization.  3. Selective coronary angiography.  4. Left ventriculography.   ACCESS AND EQUIPMENT:  The area about the right femoral artery and vein  was anesthetized with 1% lidocaine.  A 4-French sheath was placed in the  right femoral artery via modified Seldinger technique following a single  anterior wall puncture.  This was followed by placement of a 7-French  sheath in the right femoral vein via modified Seldinger technique.  A  standard 7-French balloon-tipped flow-directed catheter was used for  right heart catheterization without use of a wire.  A 4-French angled  pigtail catheter was used for left heart catheterization  ventriculography with use of a wire.  Selective coronary angiography was  performed using 4-French JL-4 and JR-4 catheters with use of a  wire.  Total of 80 mL Omnipaque was used.  The patient tolerated the procedure  well without immediate complications.   HEMODYNAMIC RESULTS:  Right atrium 3.  Right ventricle 29/0.  Pulmonary  capillary wedge pressure mean of 2.  Pulmonary artery pressure 25/6.  Aortic pressure of 142/68.  Left ventricular pressure 131/4.  Arterial  saturation 94% on room air.  Pulmonary artery saturation 72%.  Cardiac  output 6 by the Fick method.  Cardiac index 2.7 by the Fick method.   ANGIOGRAPHIC FINDINGS:  1. The left main coronary artery gives rise to the left anterior      descending and circumflex coronary arteries.  There is no      significant flow-limiting coronary atherosclerosis noted.  2. Left anterior descending is a medium caliber vessel with large      proximal diagonal branch.  There are some minor luminal      irregularities including a very small area of focal bridging in the      mid vessel, but no clear flow-limiting stenoses are noted.  3. The circumflex coronary artery is a medium to large caliber vessel      with three obtuse marginal branches, the second of which  is the      largest and trifurcates.  There are some minor luminal      irregularities but no flow-limiting stenoses.  4. The right coronary artery is a small to medium caliber dominant      vessel with right ventricular marginal branch.  Minor luminal      irregularities are noted without flow-limiting stenoses.   Left ventriculography was performed in the RAO projection.  There was  significant ventricular ectopy.  However, the left ventricular ejection  fraction was approximately 60%.  There did look to be an area of focal  outpouching involving the inferior apical wall with associated  hypokinesis of uncertain significance.  The degree of mitral  regurgitation looks to be 1+ to perhaps 2+ in the setting of ventricular  ectopy.  There does appear to be enlargement of the left atrium.   DIAGNOSES:  1. No  significant flow-limiting coronary artery disease noted within      the major epicardial vessels.  There is a very small area of      bridging within the left anterior descending, although of uncertain      clinical significance.  2. Left ventricular ejection fraction approximately 60% with focal      area of inferior apical outpouching and associated hypokinesis.      Mitral regurgitation looks to be in the 1-2+ range in the setting      of ventricular ectopy.  No significant left ventricular to aortic      outflow gradient.  3. Relatively low right heart pressures as outlined above with normal      cardiac output and index.  There are small V-waves noted in the      pulmonary capillary wedge tracing.   DISCUSSION:  I reviewed the results with the patient, her family and  with Dr. Andee Lineman.  Mrs. Tull has already been referred to see Dr. Nolon Rod at [facility] for further assessment of the mitral valve  disease.  This information will be forwarded to him.      [name], MD  Electronically Signed     SGM/MEDQ  D:  01/20/2007  T:  01/21/2007  Job:  [id number]   cc:   [name], MD,FACC

## 2011-03-26 NOTE — Op Note (Signed)
NAME:  Toni [name], Toni [name]                          ACCOUNT NO.:  [id number]   MEDICAL RECORD NO.:  [id number]                   PATIENT TYPE:  AMB   LOCATION:  DAY                                  FACILITY:  [facility]   PHYSICIAN:  R. Roetta Sessions, M.D.              DATE OF BIRTH:  1955/12/04   DATE OF PROCEDURE:  DATE OF DISCHARGE:  04/21/2004                                 OPERATIVE REPORT   PROCEDURE:  Esophagogastroduodenoscopy with Elease Hashimoto dilation.   INDICATIONS:  The patient is a 55 year old lady.  She now has her current  esophageal dysphagia to solids and pills.  She had an EGD by Dr. Marcell Anger  at [facility] last year for similar symptoms with transient  improvement in her symptoms.  She does have discomfort consistent with  reflux symptoms fairly well controlled with Protonix.  She tells me she  underwent a barium esophagram recently which demonstrated the protruding  cervical spur, but no other abnormalities were observed.  EGD is now being  done.  This approach has been discussed with the patient.  The potential  risks, benefits and alternatives have been reviewed, questions answered.  Please see the handwritten H&P for more information.   DESCRIPTION OF PROCEDURE:  Oxygen saturation, blood pressure, pulse and  respiration were monitored throughout the entire procedure.  Conscious  sedation with IV Versed delivered in incremental doses. SBE prophylaxis with  ampicillin 2 g IV and gentamicin 160 mg IV prior to the procedure.  The  instrument was the Olympus video chip system.   FINDINGS:  Esophagus:  Examination of the tubular esophagus revealed no  mucosal abnormalities.  The tubular esophagus appeared widely patent.  The  EG junction was easily traversed.   Stomach:  The gastric cavity was empty and insufflated well with air.  A  thorough examination of the gastric mucosa including the retroflexed view of  the proximal stomach and esophagogastric junction  demonstrated only a small  hiatal hernia.  Pylorus was patent and easily traversed.  Examination of the  bulb and second portion revealed no abnormalities.   THERAPY AND DIAGNOSTIC MANEUVERS:  A 56-French Maloney dilator was passed to  full insertion __________ revealed an apparent complications __________.  The patient tolerated the procedure well and was reactive after endoscopy.   IMPRESSION:  1. Normal-appearing tubular esophagus, status post passage of Maloney     dilator.  2. Small hiatal hernia; otherwise normal stomach, D1 and D2.   RECOMMENDATIONS:  1. Continue Protonix 40 mg orally daily.  2. If her dysphagia recurs, she may need further evaluation; i.e. further     evaluation by esophageal manometry, etc.  3. Followup with Dr. Sherril Croon.      ___________________________________________  Jonathon Bellows, M.D.   RMR/MEDQ  D:  04/21/2004  T:  04/21/2004  Job:  [id number]   cc:   Doreen Beam  [address]  [place]  [place] [address]  Fax: [phone]

## 2011-03-26 NOTE — Assessment & Plan Note (Signed)
[facility]                          [facility]ICE NOTE   GEMA, RINGOLD                       MRN:          [id number]  DATE:01/17/2007                            DOB:          May 04, 1956    HISTORY OF PRESENT ILLNESS:  The patient is a 55 year old African  American female who presented recently with new-onset congestive heart  failure secondary to significant mitral regurgitation.  The patient was  found to have severe left atrial enlargement and bileaflet prolapse.  A  TE was performed, and confirmed a diagnosis of anterior leaflet  prolapse, and a posterior leaflet prolapse.  The patient was started on  diuretic therapy, and remained in hospital for 2 days.  She is not doing  much better.  She is less short of breath, but she remains in NYHA class  2B.  She presented, however, with class IV heart failure symptoms.   The patient presents today to the office mainly to discuss the need to  proceed with cardiac catheterization.  She actually has been scheduled  already for left and right heart catheterization this Friday.  The plan  is, after the heart catheterization, to refer the patient to [facility].  I have already contacted Dr. Regino Schultze, who will see the patient  together with Dr. Jola Babinski in consultation.  I suspect the patient will  need a mitral valve repair through a lateral thoracotomy.   MEDICATIONS:  1. Protonix 40 mg p.o. daily.  2. Aspirin 81 mg a day.  3. Lopressor 100 mg p.o. a day.  4. Benicar/ hydrochlorothiazide 40/12.5 p.o. a day.  5. Ogen 0.625 daily.  6. Lasix 40 mg 1 tablet p.o. b.i.d.  7. K-Dur 20 mEq p.o. b.i.d.   PHYSICAL EXAMINATION:  VITAL SIGNS:  Blood pressure 141/84.  Heart rate  is 60 beats per minute.  Weight is 238 pounds.  GENERAL:  Well-nourished African American female in no apparent  distress.  HEENT:  Pupils isocoric .  Eyes are clear.  NECK:  Supple.  Normal carotid upstroke.  No carotid  bruits.  LUNGS:  Clear breath sounds bilaterally.  HEART:  Regular rate and rhythm with a 2/6 holosystolic murmur at the  apex.  ABDOMEN:  Soft.  EXTREMITY EXAM:  Reveals 1 to 2+ peripheral pitting edema.   PROBLEM LIST:  1. New onset congestive heart failure.      a.     Status post New York Heart Association class IV heart       failure, now New York Heart Association class 2B.      b.     Moderately severe mitral regurgitation secondary to       bileaflet prolapse (anterior greater than posterior).      c.     Eccentric MR, moderately severe :seeTEE report.      d.     Severe left atrial enlargement.  2. History of cardiac murmur since childhood.  3. Atypical chest pain (patient was admitted November 2007 with chest      pain and ruled out).  4. Subjective fever, chills,  and night sweats with negative blood      cultures during last admission.   PLAN:  1. The patient has symptomatic moderately severe mitral regurgitation.      She actually may have severe mitral regurgitation.  The jet is      rather eccentric on TEE.  In any event, she is symptomatic, and      will be referred for left and right heart catheterization.  2. I have already contacted Dr. Regino Schultze at [facility], and after      catheterization the patient will need to be referred to him and Dr.      Jola Babinski to consider mitral repair through a lateral thoracotomy, if      indeed catheterization confirms the data we have currently on hand.  3. The patient will have an EKG done in the office, as she did      complain of an episode of chest pain after she was discharged at      the hospital.  The pain, however, sounds atypical.  4. The patient will follow up with me after she has seen Dr. Modesto Charon at      [facility].     [name], MD,FACC  Electronically Signed    GED/MedQ  DD: 01/17/2007  DT: 01/19/2007  Job #: [id number]   cc:   Weyman Pedro

## 2011-09-01 ENCOUNTER — Encounter: Payer: Self-pay | Admitting: *Deleted

## 2011-09-02 ENCOUNTER — Encounter: Payer: Self-pay | Admitting: Cardiology

## 2011-09-02 ENCOUNTER — Ambulatory Visit (INDEPENDENT_AMBULATORY_CARE_PROVIDER_SITE_OTHER): Payer: PRIVATE HEALTH INSURANCE | Admitting: Cardiology

## 2011-09-02 VITALS — BP 122/75 | HR 71 | Ht 68.0 in | Wt 235.0 lb

## 2011-09-02 DIAGNOSIS — I080 Rheumatic disorders of both mitral and aortic valves: Secondary | ICD-10-CM

## 2011-09-02 DIAGNOSIS — M549 Dorsalgia, unspecified: Secondary | ICD-10-CM

## 2011-09-02 DIAGNOSIS — G8929 Other chronic pain: Secondary | ICD-10-CM | POA: Insufficient documentation

## 2011-09-02 DIAGNOSIS — G4700 Insomnia, unspecified: Secondary | ICD-10-CM | POA: Insufficient documentation

## 2011-09-02 DIAGNOSIS — R609 Edema, unspecified: Secondary | ICD-10-CM

## 2011-09-02 DIAGNOSIS — I5032 Chronic diastolic (congestive) heart failure: Secondary | ICD-10-CM

## 2011-09-02 DIAGNOSIS — I509 Heart failure, unspecified: Secondary | ICD-10-CM

## 2011-09-02 DIAGNOSIS — I4891 Unspecified atrial fibrillation: Secondary | ICD-10-CM

## 2011-09-02 MED ORDER — TRAZODONE HCL 50 MG PO TABS: ORAL_TABLET | ORAL | Status: DC

## 2011-09-02 MED ORDER — HYDROCODONE-ACETAMINOPHEN 7.5-500 MG PO TABS: 1.0000 | ORAL_TABLET | Freq: Two times a day (BID) | ORAL | Status: DC | PRN

## 2011-09-02 NOTE — Assessment & Plan Note (Signed)
No evidence of volume overload continue medical therapy

## 2011-09-02 NOTE — Assessment & Plan Note (Signed)
Will follow mitral regurgitation with yearly echocardiograms. She has mild to moderate mitral regurgitation.

## 2011-09-02 NOTE — Assessment & Plan Note (Signed)
Patient remains in normal sinus rhythm. 

## 2011-09-02 NOTE — Assessment & Plan Note (Signed)
I refilled a prescription for Lortab for 10 days only. I asked the patient to followup for refills with her primary care physician.

## 2011-09-02 NOTE — Assessment & Plan Note (Signed)
I have given the patient a prescription of trazodone.

## 2011-09-02 NOTE — Assessment & Plan Note (Signed)
Venous stasis edema and dependent edema from prolonged standing

## 2011-09-02 NOTE — Patient Instructions (Signed)
   Trazodone 25mg  as needed for insomnia  Lortab - 10 day supply till see PMD Your physician wants you to follow up in: 6 months.  You will receive a reminder letter in the mail one-two months in advance.  If you don't receive a letter, please call our office to schedule the follow up appointment

## 2011-09-02 NOTE — Progress Notes (Signed)
History of present illness:  The patient is a 55 year old female with a history of diastolic heart failure, chronic peripheral edema and mild to moderate mitral regurgitation. She also has a history of paroxysmal atrial fibrillation. She has remained however normal sinus rhythm. The patient is not on Coumadin. She also has chronic stasis edema. Recently a bedside echocardiogram was performed which only showed mild eccentric mitral regurgitation. From a cardiac standpoint the patient is stable. She denies any chest pain shortness of breath orthopnea or PND. 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 She also has difficulty sleeping because of her 12 hour shifts.   Allergies, family history and social history: As documented in chart and reviewed  Medications: Documented and reviewed in chart.  Past medical history: Reviewed and see problem list below   Review of systems: No nausea or vomiting. No fever or chills. No melena or hematochezia. No dysuria or frequency. No visual changes. No palpitations, presyncope or syncope.   Physical examination : Vital signs documented below General: Well-nourished African American female in no apparent distress HEENT: Normal carotid upstroke no carotid bruits. JVD within normal limits. No thyromegaly no thyromegaly.  Lungs: Clear breath sounds bilaterally. No wheezing Heart: Regular rate and rhythm with normal S1-S2 and no murmur rubs or gallops. No definite murmur today of mitral regurgitation Abdomen: Soft nontender no rebound or guarding good bowel sounds Extremity exam: No cyanosis clubbing or edema Neurologic: Alert and oriented grossly nonfocal. Psychiatric: Normal affect Vascular exam: Normal peripheral pulses.

## 2012-02-08 ENCOUNTER — Encounter: Payer: Self-pay | Admitting: Cardiology

## 2012-02-08 ENCOUNTER — Ambulatory Visit (INDEPENDENT_AMBULATORY_CARE_PROVIDER_SITE_OTHER): Payer: PRIVATE HEALTH INSURANCE | Admitting: Cardiology

## 2012-02-08 VITALS — BP 112/76 | HR 60 | Ht 68.0 in | Wt 242.0 lb

## 2012-02-08 DIAGNOSIS — I5033 Acute on chronic diastolic (congestive) heart failure: Secondary | ICD-10-CM

## 2012-02-08 DIAGNOSIS — R0989 Other specified symptoms and signs involving the circulatory and respiratory systems: Secondary | ICD-10-CM

## 2012-02-08 DIAGNOSIS — R0609 Other forms of dyspnea: Secondary | ICD-10-CM

## 2012-02-08 DIAGNOSIS — R0602 Shortness of breath: Secondary | ICD-10-CM

## 2012-02-08 DIAGNOSIS — I059 Rheumatic mitral valve disease, unspecified: Secondary | ICD-10-CM

## 2012-02-08 DIAGNOSIS — I509 Heart failure, unspecified: Secondary | ICD-10-CM

## 2012-02-08 DIAGNOSIS — R0600 Dyspnea, unspecified: Secondary | ICD-10-CM

## 2012-02-08 DIAGNOSIS — R609 Edema, unspecified: Secondary | ICD-10-CM

## 2012-02-08 DIAGNOSIS — I5032 Chronic diastolic (congestive) heart failure: Secondary | ICD-10-CM

## 2012-02-08 DIAGNOSIS — N182 Chronic kidney disease, stage 2 (mild): Secondary | ICD-10-CM

## 2012-02-08 DIAGNOSIS — I10 Essential (primary) hypertension: Secondary | ICD-10-CM

## 2012-02-08 DIAGNOSIS — I080 Rheumatic disorders of both mitral and aortic valves: Secondary | ICD-10-CM

## 2012-02-08 DIAGNOSIS — I4891 Unspecified atrial fibrillation: Secondary | ICD-10-CM

## 2012-02-08 MED ORDER — TRAZODONE HCL 50 MG PO TABS: ORAL_TABLET | ORAL | Status: DC

## 2012-02-08 MED ORDER — MAGNESIUM OXIDE 400 MG PO CAPS: 1.0000 | ORAL_CAPSULE | Freq: Every day | ORAL | Status: DC

## 2012-02-08 NOTE — Assessment & Plan Note (Signed)
Multifactorial but followup of mitral regurgitation is required

## 2012-02-08 NOTE — Patient Instructions (Signed)
   Today - double Demadex to 40mg  daily   Today - K-Dur x 1, then repeat 4 hours later  After today, continue everything as before  Increase Trazodone to 75mg  every evening  Echo  If the results of your test are normal or stable, you will receive a letter.  If they are abnormal, the nurse will contact you by phone. Your physician wants you to follow up in: 6 months.  You will receive a reminder letter in the mail one-two months in advance.  If you don't receive a letter, please call our office to schedule the follow up appointment

## 2012-02-08 NOTE — Assessment & Plan Note (Signed)
As outlined above extra torsemide today.

## 2012-02-08 NOTE — Assessment & Plan Note (Signed)
Mild to moderate regurgitation by bedside echocardiogram. Will order formal echocardiogram. Also assessment of systolic and diastolic function

## 2012-02-08 NOTE — Assessment & Plan Note (Signed)
Patient remains in normal sinus rhythm. 

## 2012-02-08 NOTE — Assessment & Plan Note (Signed)
Blood pressure well controlled. No further adjustments in medications required 

## 2012-02-08 NOTE — Assessment & Plan Note (Signed)
Patient has some significant volume overload on exam today. I recommended for her to double her torsemide today and also take 2 doses of gait over 40 mEq 4 hours apart. The patient also needs to restrict fluid intake. She eats a lot of mustard which contains a lot of salt. I instructed her to cut back on this

## 2012-02-08 NOTE — Progress Notes (Signed)
Toni Bottoms, MD, [facility] ABIM Board Certified in Adult Cardiovascular Medicine,Internal Medicine and Critical Care Medicine    CC: Followup patient with shortness of breath and mild to moderate mitral regurgitation  HPI:  The patient is a 56 year old female with a history of diastolic heart failure, chronic peripheral edema and mild to moderate mitral regurgitation. She also has a history of paroxysmal atrial fibrillation but has remained in normal sinus rhythm. The patient is not on anticoagulation. The patient reports a frequent night cramps. She also has gained weight and reports increased lower extremity edema. She has now switched to the third shift and has significant difficulty sleeping during the daytime. From a cardiac standpoint otherwise she's stable her blood pressure is doing quite well. She has no orthopnea PND presyncope or syncope.  PMH: reviewed and listed in Problem List in Electronic Records (and see below) Past Medical History  Diagnosis Date  . Mitral valve insufficiency and aortic valve insufficiency   . Paroxysmal atrial fibrillation     Stable no recurrence  . Unspecified essential hypertension   . Other chest pain   . Esophageal reflux   . Congestive heart failure, unspecified     Diastolic heart failure  . Mitral valve disorders     Mild to moderate mitral regurgitation  . Other dyspnea and respiratory abnormality   . Morbid obesity   . Degeneration of lumbar or lumbosacral intervertebral disc   . Peripheral edema     chronic  . Normocytic anemia   . Chronic kidney disease, stage II (mild)   . Dyspnea      chronic multifactorial large component of deconditioning  . Insomnia     Working third shift   Past Surgical History  Procedure Date  . Total abdominal hysterectomy   . Appendectomy   . Cholecystectomy   . Total knee arthroplasty     Allergies/SH/FHX : available in Electronic Records for review  Allergies  Allergen Reactions  . Iodinated  Diagnostic Agents    History   Social History  . Marital Status: Single    Spouse Name: N/A    Number of Children: N/A  . Years of Education: N/A   Occupational History  . Not on file.   Social History Main Topics  . Smoking status: Former Smoker -- 0.5 packs/day for 23 years    Types: Cigarettes    Quit date: 11/09/1995  . Smokeless tobacco: Never Used  . Alcohol Use: Not on file  . Drug Use: Not on file  . Sexually Active: Not on file   Other Topics Concern  . Not on file   Social History Narrative  . No narrative on file   Family History  Problem Relation Age of Onset  . Cancer    . Stroke    . Diabetes    . Hyperlipidemia    . Hypertension      Medications: Current Outpatient Prescriptions  Medication Sig Dispense Refill  . aspirin EC 81 MG tablet Take 81 mg by mouth daily.        . diphenhydrAMINE (BENADRYL) 25 MG tablet Take 25 mg by mouth every 6 (six) hours as needed.       Marland Kitchen HYDROcodone-acetaminophen (LORTAB) 7.5-500 MG per tablet Take 1 tablet by mouth 2 (two) times daily as needed.  20 tablet  0  . hyoscyamine (LEVBID) 0.375 MG 12 hr tablet Take 0.375 mg by mouth every 12 (twelve) hours as needed.        Marland Kitchen  indomethacin (INDOCIN) 25 MG capsule Take 25 mg by mouth 2 (two) times daily with a meal.        . metoprolol (LOPRESSOR) 50 MG tablet Take 50 mg by mouth 2 (two) times daily.        . nitroGLYCERIN (NITROSTAT) 0.4 MG SL tablet Place 0.4 mg under the tongue every 5 (five) minutes as needed.        Marland Kitchen olmesartan-hydrochlorothiazide (BENICAR HCT) 40-25 MG per tablet Take 1 tablet by mouth daily.        . pantoprazole (PROTONIX) 40 MG tablet Take 40 mg by mouth daily.        . potassium chloride SA (K-DUR,KLOR-CON) 20 MEQ tablet Take 40 mEq by mouth daily.       Marland Kitchen torsemide (DEMADEX) 20 MG tablet Take 20 mg by mouth daily.        . traZODone (DESYREL) 50 MG tablet May take 1 1/2 tabs (75mg ) every evening  45 tablet  2  . triamcinolone (KENALOG) 0.1 % cream  Apply 1 application topically 2 (two) times daily as needed.        Marland Kitchen DISCONTD: traZODone (DESYREL) 50 MG tablet May take 1/2 tab (25mg ) every evening as needed for insomnia  15 tablet  1  . Magnesium Oxide 400 MG CAPS Take 1 capsule (400 mg total) by mouth daily.  30 capsule  6    ROS: No nausea or vomiting. No fever or chills.No melena or hematochezia.No bleeding.No claudication  Physical Exam: BP 112/76  Pulse 60  Ht 5\' 8"  (1.727 m)  Wt 242 lb (109.77 kg)  BMI 36.80 kg/m2 General: Well-nourished and overweight African female in no distress. Neck: Normal carotid stroke and no carotid bruits no thyromegaly nonnodular thyroid. JVP 5-6 cm Lungs: Clear breath sounds bilaterally. No wheezing Cardiac: Regular rate and rhythm with normal S1-S2 no murmur rubs or gallops Vascular: No edema. Normal distal pulses Skin: Warm and dry Physcologic: Normal affect  12lead ECG: Normal sinus rhythm with nonspecific T-wave changes in inferior leads otherwise normal tracing Limited bedside ECHO:N/A No images are attached to the encounter.   Assessment and Plan  MITRAL REGURGITATION Mild to moderate regurgitation by bedside echocardiogram. Will order formal echocardiogram. Also assessment of systolic and diastolic function  HYPERTENSION, UNSPECIFIED Blood pressure well controlled. No further adjustments in medications required  CHRONIC DIASTOLIC HEART FAILURE Patient has some significant volume overload on exam today. I recommended for her to double her torsemide today and also take 2 doses of gait over 40 mEq 4 hours apart. The patient also needs to restrict fluid intake. She eats a lot of mustard which contains a lot of salt. I instructed her to cut back on this  ATRIAL FIBRILLATION Patient remains in normal sinus rhythm  ACUTE ON CHRONIC DIASTOLIC HEART FAILURE As outlined above extra torsemide today.  SHORTNESS OF BREATH Multifactorial but followup of mitral regurgitation is  required    Patient Active Problem List  Diagnoses  . HYPOKALEMIA  . ANEMIA  . MITRAL REGURGITATION  . HYPERTENSION, UNSPECIFIED  . ATRIAL FIBRILLATION  . CHRONIC DIASTOLIC HEART FAILURE  . ACUTE ON CHRONIC DIASTOLIC HEART FAILURE  . SHORTNESS OF BREATH  . PRECORDIAL PAIN  . OTH NONSPECIFIC ABNORM CV SYSTEM FUNCTION STUDY  . EDEMA  . Insomnia.patient was given a prescription for trazodone previously which we increased to 75 mg daily.   . Chronic back pain  . Chronic kidney disease, stage II (mild)

## 2012-02-17 ENCOUNTER — Other Ambulatory Visit: Payer: Self-pay

## 2012-02-17 ENCOUNTER — Other Ambulatory Visit (INDEPENDENT_AMBULATORY_CARE_PROVIDER_SITE_OTHER): Payer: PRIVATE HEALTH INSURANCE

## 2012-02-17 DIAGNOSIS — I509 Heart failure, unspecified: Secondary | ICD-10-CM

## 2012-02-17 DIAGNOSIS — I080 Rheumatic disorders of both mitral and aortic valves: Secondary | ICD-10-CM

## 2012-02-17 DIAGNOSIS — R609 Edema, unspecified: Secondary | ICD-10-CM

## 2012-02-17 DIAGNOSIS — I359 Nonrheumatic aortic valve disorder, unspecified: Secondary | ICD-10-CM

## 2012-02-17 DIAGNOSIS — I059 Rheumatic mitral valve disease, unspecified: Secondary | ICD-10-CM

## 2012-02-17 DIAGNOSIS — I4891 Unspecified atrial fibrillation: Secondary | ICD-10-CM

## 2012-02-21 ENCOUNTER — Encounter: Payer: Self-pay | Admitting: *Deleted

## 2012-03-06 ENCOUNTER — Ambulatory Visit: Payer: PRIVATE HEALTH INSURANCE | Admitting: Cardiology

## 2012-03-13 ENCOUNTER — Other Ambulatory Visit: Payer: Self-pay | Admitting: *Deleted

## 2012-03-13 MED ORDER — TRAZODONE HCL 50 MG PO TABS: ORAL_TABLET | ORAL | Status: DC

## 2012-04-18 ENCOUNTER — Encounter: Payer: PRIVATE HEALTH INSURANCE | Admitting: Internal Medicine

## 2012-04-19 ENCOUNTER — Encounter: Payer: PRIVATE HEALTH INSURANCE | Admitting: Internal Medicine

## 2012-04-19 DIAGNOSIS — D638 Anemia in other chronic diseases classified elsewhere: Secondary | ICD-10-CM

## 2012-04-19 DIAGNOSIS — E876 Hypokalemia: Secondary | ICD-10-CM

## 2012-04-19 DIAGNOSIS — N181 Chronic kidney disease, stage 1: Secondary | ICD-10-CM

## 2012-10-02 ENCOUNTER — Other Ambulatory Visit: Payer: Self-pay | Admitting: Cardiology

## 2015-02-27 DIAGNOSIS — Z9889 Other specified postprocedural states: Secondary | ICD-10-CM | POA: Insufficient documentation

## 2015-02-27 DIAGNOSIS — Z8679 Personal history of other diseases of the circulatory system: Secondary | ICD-10-CM

## 2015-02-27 HISTORY — DX: Personal history of other diseases of the circulatory system: Z86.79

## 2015-02-27 HISTORY — PX: CARDIAC VALVE SURGERY: SHX40

## 2015-02-27 HISTORY — PX: MINIMALLY INVASIVE TRICUSPID VALVE REPAIR: SHX5975

## 2015-02-27 HISTORY — PX: MITRAL VALVE REPAIR: SHX2039

## 2015-02-27 HISTORY — DX: Other specified postprocedural states: Z98.890

## 2015-03-18 ENCOUNTER — Encounter: Payer: Self-pay | Admitting: Cardiovascular Disease

## 2015-03-27 ENCOUNTER — Encounter (HOSPITAL_COMMUNITY)
Admission: RE | Admit: 2015-03-27 | Discharge: 2015-03-27 | Disposition: A | Discharge status: Home / Self Care | Payer: PRIVATE HEALTH INSURANCE | Source: Ambulatory Visit | Attending: Cardiovascular Disease | Admitting: Cardiovascular Disease

## 2015-03-27 VITALS — BP 124/72 | HR 72 | Ht 68.0 in | Wt 226.6 lb

## 2015-03-27 DIAGNOSIS — I340 Nonrheumatic mitral (valve) insufficiency: Secondary | ICD-10-CM

## 2015-03-27 DIAGNOSIS — Z954 Presence of other heart-valve replacement: Secondary | ICD-10-CM | POA: Diagnosis not present

## 2015-03-27 DIAGNOSIS — Z952 Presence of prosthetic heart valve: Secondary | ICD-10-CM | POA: Insufficient documentation

## 2015-03-27 DIAGNOSIS — Z953 Presence of xenogenic heart valve: Secondary | ICD-10-CM

## 2015-03-27 NOTE — Progress Notes (Signed)
Patient arrived at 9 am. Patient was referred to CR by Dr. Hamilton Capri post Mitral Valve replacement Z95.3. MV Regurgitation I34.0. During orientation advised patient on arrival and appointment times what to wear, what to do before, during and after exercise. Reviewed attendance and class policy. Talked about inclement weather and class consultation policy. Pt is scheduled to start Cardiac Rehab on 04/14/15 at 9:30. Pt was advised to come to class 5 minutes before class starts. He was also given instructions on meeting with the dietician and attending the Family Structure classes. Pt is eager to get started. Patient was oriented to the gym, measured for the equipment and pre anthropometric measurements were taken. Patient was able to complete 6 minute walk test with the assistance of pushing a wheelchair.  Patient orientation/education ended at 12:30.

## 2015-03-27 NOTE — Progress Notes (Signed)
Cardiac/Pulmonary Rehab Medication Review by a Pharmacist  Does the patient  feel that his/her medications are working for him/her?  yes  Has the patient been experiencing any side effects to the medications prescribed?  no  Does the patient measure his/her own blood pressure or blood glucose at home?  yes -checks weight & blood pressure daily & temperature twice daily  Does the patient have any problems obtaining medications due to transportation or finances?   Not yet- although not working & health insurance to run out next month  Understanding of regimen: excellent Understanding of indications: excellent Potential of compliance: excellent  Questions asked to Determine Patient Understanding of Medication Regimen:  1. What is the name of the medication?  2. What is the medication used for?  3. When should it be taken?  4. How much should be taken?  5. How will you take it?  6. What side effects should you report?  Understanding Defined as: Excellent: All questions above are correct Good: Questions 1-4 are correct Fair: Questions 1-2 are correct  Poor: 1 or none of the above questions are correct   Toni [name] 03/27/2015 9:23 AM

## 2015-04-14 ENCOUNTER — Encounter (HOSPITAL_COMMUNITY)
Admission: RE | Admit: 2015-04-14 | Discharge: 2015-04-14 | Disposition: A | Discharge status: Home / Self Care | Payer: PRIVATE HEALTH INSURANCE | Source: Ambulatory Visit | Attending: Cardiovascular Disease | Admitting: Cardiovascular Disease

## 2015-04-14 DIAGNOSIS — Z952 Presence of prosthetic heart valve: Secondary | ICD-10-CM | POA: Insufficient documentation

## 2015-04-14 DIAGNOSIS — Z954 Presence of other heart-valve replacement: Secondary | ICD-10-CM | POA: Insufficient documentation

## 2015-04-16 ENCOUNTER — Encounter (HOSPITAL_COMMUNITY)
Admission: RE | Admit: 2015-04-16 | Discharge: 2015-04-16 | Disposition: A | Discharge status: Home / Self Care | Payer: PRIVATE HEALTH INSURANCE | Source: Ambulatory Visit | Attending: Cardiovascular Disease | Admitting: Cardiovascular Disease

## 2015-04-16 DIAGNOSIS — Z952 Presence of prosthetic heart valve: Secondary | ICD-10-CM | POA: Diagnosis not present

## 2015-04-16 NOTE — Progress Notes (Signed)
Cardiac Rehabilitation Program Outcomes Report   Orientation:  03/27/15 Graduate Date:  tbd Discharge Date:  tbd # of sessions completed: 3  Cardiologist: Clevenger Family MD:  Leslye Peer Time:  0930  A.  Exercise Program:  Tolerates exercise @ 3.68 mets METS for 15 minutes and Walk Test Results:  Pre: 2.17 mets  B.  Mental Health:  Good mental attitude  C.  Education/Instruction/Skills  Accurately checks own pulse.  Rest:  73  Exercise:  91  Uses Perceived Exertion Scale and/or Dyspnea Scale  D.  Nutrition/Weight Control/Body Composition:  Adherence to prescribed nutrition program: fair    E.  Blood Lipids   No results found for: CHOL, HDL, LDLCALC, LDLDIRECT, TRIG, CHOLHDL  F.  Lifestyle Changes:  Making positive lifestyle changes and Not smoking:  Quit 1996  G.  Symptoms noted with exercise:  Asymptomatic  Report Completed By:  Stevphen Rochester RN   Comments:  This is patients first week in [facility].

## 2015-04-18 ENCOUNTER — Encounter (HOSPITAL_COMMUNITY)
Admission: RE | Admit: 2015-04-18 | Discharge: 2015-04-18 | Disposition: A | Discharge status: Home / Self Care | Payer: PRIVATE HEALTH INSURANCE | Source: Ambulatory Visit | Attending: Cardiovascular Disease | Admitting: Cardiovascular Disease

## 2015-04-18 DIAGNOSIS — Z952 Presence of prosthetic heart valve: Secondary | ICD-10-CM | POA: Diagnosis not present

## 2015-04-21 ENCOUNTER — Encounter (HOSPITAL_COMMUNITY)
Admission: RE | Admit: 2015-04-21 | Discharge: 2015-04-21 | Disposition: A | Discharge status: Home / Self Care | Payer: PRIVATE HEALTH INSURANCE | Source: Ambulatory Visit | Attending: Cardiovascular Disease | Admitting: Cardiovascular Disease

## 2015-04-21 DIAGNOSIS — Z952 Presence of prosthetic heart valve: Secondary | ICD-10-CM | POA: Diagnosis not present

## 2015-04-23 ENCOUNTER — Encounter (HOSPITAL_COMMUNITY): Payer: PRIVATE HEALTH INSURANCE

## 2015-04-25 ENCOUNTER — Encounter (HOSPITAL_COMMUNITY): Payer: PRIVATE HEALTH INSURANCE

## 2015-04-28 ENCOUNTER — Encounter (HOSPITAL_COMMUNITY): Payer: PRIVATE HEALTH INSURANCE

## 2015-04-30 ENCOUNTER — Encounter (HOSPITAL_COMMUNITY): Payer: PRIVATE HEALTH INSURANCE

## 2015-05-02 ENCOUNTER — Encounter (HOSPITAL_COMMUNITY): Payer: PRIVATE HEALTH INSURANCE

## 2015-05-05 ENCOUNTER — Encounter (HOSPITAL_COMMUNITY): Payer: PRIVATE HEALTH INSURANCE

## 2015-05-07 ENCOUNTER — Encounter (HOSPITAL_COMMUNITY): Payer: PRIVATE HEALTH INSURANCE

## 2015-05-09 ENCOUNTER — Encounter (HOSPITAL_COMMUNITY): Payer: PRIVATE HEALTH INSURANCE

## 2015-05-12 ENCOUNTER — Encounter (HOSPITAL_COMMUNITY): Payer: PRIVATE HEALTH INSURANCE

## 2015-05-13 NOTE — Addendum Note (Signed)
Encounter addended by: Cathie Olden, RN on: 05/13/2015 12:51 PM<BR>     Documentation filed: Notes Section

## 2015-05-13 NOTE — Progress Notes (Signed)
Patient is discharged from [facility] program today, April 21, 2015 with 5 sessions.  Patient stated she stopped due to insurance.

## 2015-05-14 ENCOUNTER — Encounter (HOSPITAL_COMMUNITY): Payer: PRIVATE HEALTH INSURANCE

## 2015-05-16 ENCOUNTER — Encounter (HOSPITAL_COMMUNITY): Payer: PRIVATE HEALTH INSURANCE

## 2015-05-19 ENCOUNTER — Encounter (HOSPITAL_COMMUNITY): Payer: PRIVATE HEALTH INSURANCE

## 2015-05-21 ENCOUNTER — Encounter (HOSPITAL_COMMUNITY): Payer: PRIVATE HEALTH INSURANCE

## 2015-05-23 ENCOUNTER — Encounter (HOSPITAL_COMMUNITY): Payer: PRIVATE HEALTH INSURANCE

## 2015-05-26 ENCOUNTER — Encounter (HOSPITAL_COMMUNITY): Payer: PRIVATE HEALTH INSURANCE

## 2015-05-28 ENCOUNTER — Encounter (HOSPITAL_COMMUNITY): Payer: PRIVATE HEALTH INSURANCE

## 2015-05-30 ENCOUNTER — Encounter (HOSPITAL_COMMUNITY): Payer: PRIVATE HEALTH INSURANCE

## 2015-06-02 ENCOUNTER — Encounter (HOSPITAL_COMMUNITY): Payer: PRIVATE HEALTH INSURANCE

## 2015-06-04 ENCOUNTER — Encounter (HOSPITAL_COMMUNITY): Payer: PRIVATE HEALTH INSURANCE

## 2015-06-06 ENCOUNTER — Encounter (HOSPITAL_COMMUNITY): Payer: PRIVATE HEALTH INSURANCE

## 2015-06-09 ENCOUNTER — Encounter (HOSPITAL_COMMUNITY): Payer: PRIVATE HEALTH INSURANCE

## 2015-06-11 ENCOUNTER — Encounter (HOSPITAL_COMMUNITY): Payer: PRIVATE HEALTH INSURANCE

## 2015-06-13 ENCOUNTER — Encounter (HOSPITAL_COMMUNITY): Payer: PRIVATE HEALTH INSURANCE

## 2015-06-16 ENCOUNTER — Encounter (HOSPITAL_COMMUNITY): Payer: PRIVATE HEALTH INSURANCE

## 2015-06-18 ENCOUNTER — Encounter (HOSPITAL_COMMUNITY): Payer: PRIVATE HEALTH INSURANCE

## 2015-06-20 ENCOUNTER — Encounter (HOSPITAL_COMMUNITY): Payer: PRIVATE HEALTH INSURANCE

## 2015-06-23 ENCOUNTER — Encounter (HOSPITAL_COMMUNITY): Payer: PRIVATE HEALTH INSURANCE

## 2015-06-25 ENCOUNTER — Encounter (HOSPITAL_COMMUNITY): Payer: PRIVATE HEALTH INSURANCE

## 2015-06-27 ENCOUNTER — Encounter (HOSPITAL_COMMUNITY): Payer: PRIVATE HEALTH INSURANCE

## 2015-06-30 ENCOUNTER — Encounter (HOSPITAL_COMMUNITY): Payer: PRIVATE HEALTH INSURANCE

## 2015-07-02 ENCOUNTER — Encounter (HOSPITAL_COMMUNITY): Payer: PRIVATE HEALTH INSURANCE

## 2015-07-04 ENCOUNTER — Encounter (HOSPITAL_COMMUNITY): Payer: PRIVATE HEALTH INSURANCE

## 2016-06-28 ENCOUNTER — Encounter: Payer: Self-pay | Admitting: *Deleted

## 2016-06-29 ENCOUNTER — Ambulatory Visit (INDEPENDENT_AMBULATORY_CARE_PROVIDER_SITE_OTHER): Payer: Self-pay | Admitting: Cardiology

## 2016-06-29 ENCOUNTER — Encounter: Payer: Self-pay | Admitting: *Deleted

## 2016-06-29 ENCOUNTER — Encounter: Payer: Self-pay | Admitting: Cardiology

## 2016-06-29 VITALS — BP 136/77 | HR 64 | Ht 68.0 in | Wt 240.6 lb

## 2016-06-29 DIAGNOSIS — R002 Palpitations: Secondary | ICD-10-CM

## 2016-06-29 DIAGNOSIS — I4891 Unspecified atrial fibrillation: Secondary | ICD-10-CM

## 2016-06-29 DIAGNOSIS — Z9889 Other specified postprocedural states: Secondary | ICD-10-CM

## 2016-06-29 NOTE — Progress Notes (Addendum)
Clinical Summary Ms. Gaviria is a 60 y.o.female seen today as a new patient,she was last seen by Dr Dannielle Burn 5 years ago. She was most recently followed at [facility] cardiology  1. Mitral regurgitation - history of previous MV repair at [facility] 02/2015 - Radical Repair with resection of P2 Prolapse, 26 mm Physio II Ring Annuloplasty), Tricuspid Valve Repair (26 mm Ring Annuloplasty, Cox Maze IV Procedure on 02/27/2015 - cath prior to surgery showed no significant CAD - post repair echo 02/2015 showed no MR, mild TR.   - has had some recent SOB. Increased DOE over the last few months, at 1/2 block. Subjective abdominal distension. +orthopnea x 3 pillows which is chronic. Reports some weight gain.  - taking torsemide 20mg  daily, but will take additional 20 if needed.   2. Afib - reports recent palpitations, typically with activity but can occur at rest. Lasts just a few minutes. Occurs 1-2 times a week - can have sudden dizziness at times.  - has not been on anticoag.      Past Medical History:  Diagnosis Date  . Chronic kidney disease, stage II (mild)   . Congestive heart failure, unspecified    Diastolic heart failure  . Degeneration of lumbar or lumbosacral intervertebral disc   . Dyspnea     chronic multifactorial large component of deconditioning  . Esophageal reflux   . Insomnia    Working third shift  . Mitral valve disorders    Mild to moderate mitral regurgitation  . Mitral valve insufficiency and aortic valve insufficiency   . Morbid obesity ([place])   . Normocytic anemia   . Other chest pain   . Other dyspnea and respiratory abnormality   . Paroxysmal atrial fibrillation ([name])    Stable no recurrence  . Peripheral edema    chronic  . Unspecified essential hypertension      Allergies  Allergen Reactions  . Iodinated Diagnostic Agents      Current Outpatient Prescriptions  Medication Sig Dispense Refill  . aspirin 325 MG EC tablet Take 325 mg by mouth  daily.    Marland Kitchen dimenhyDRINATE (DRAMAMINE) 50 MG tablet Take 50 mg by mouth at bedtime as needed (to help her sleep).    . diphenhydrAMINE (BENADRYL) 25 MG tablet Take 25 mg by mouth at bedtime as needed for sleep.     Marland Kitchen HYDROcodone-acetaminophen (NORCO/VICODIN) 5-325 MG per tablet Take 1-2 tablets by mouth every 6 (six) hours as needed for moderate pain.    . hyoscyamine (LEVBID) 0.375 MG 12 hr tablet Take 0.375 mg by mouth every 12 (twelve) hours as needed for cramping.     Marland Kitchen ibuprofen (ADVIL,MOTRIN) 200 MG tablet Take 800 mg by mouth every 8 (eight) hours as needed for mild pain. Alternates with Norco if needed    . lisinopril (PRINIVIL,ZESTRIL) 10 MG tablet Take 10 mg by mouth daily.    . magnesium oxide (MAG-OX) 400 MG tablet Take 400 mg by mouth 2 (two) times daily.    . metoprolol (LOPRESSOR) 100 MG tablet Take 100 mg by mouth 2 (two) times daily.    . nitroGLYCERIN (NITROSTAT) 0.4 MG SL tablet Place 0.4 mg under the tongue every 5 (five) minutes as needed for chest pain.     Marland Kitchen ondansetron (ZOFRAN) 4 MG tablet Take 4 mg by mouth every 8 (eight) hours as needed for nausea or vomiting.    . pantoprazole (PROTONIX) 40 MG tablet Take 40 mg by mouth daily.      Marland Kitchen  potassium chloride SA (K-DUR,KLOR-CON) 20 MEQ tablet Take 20 mEq by mouth daily.     . ranitidine (ZANTAC) 300 MG capsule Take 300 mg by mouth every evening.    Marland Kitchen spironolactone (ALDACTONE) 25 MG tablet Take 25 mg by mouth daily.    Marland Kitchen torsemide (DEMADEX) 20 MG tablet Take 40 mg by mouth daily.      No current facility-administered medications for this visit.      Past Surgical History:  Procedure Laterality Date  . APPENDECTOMY    . CHOLECYSTECTOMY    . TOTAL ABDOMINAL HYSTERECTOMY    . TOTAL KNEE ARTHROPLASTY       Allergies  Allergen Reactions  . Iodinated Diagnostic Agents       Family History  Problem Relation Age of Onset  . Cancer    . Stroke    . Diabetes    . Hyperlipidemia    . Hypertension       Social  History Ms. Bendell reports that she quit smoking about 20 years ago. Her smoking use included Cigarettes. She has a 11.50 pack-year smoking history. She has never used smokeless tobacco. Ms. Wolthuis has no alcohol history on file.   Review of Systems CONSTITUTIONAL: No weight loss, fever, chills, weakness or fatigue.  HEENT: Eyes: No visual loss, blurred vision, double vision or yellow sclerae.No hearing loss, sneezing, congestion, runny nose or sore throat.  SKIN: No rash or itching.  CARDIOVASCULAR: per HPI RESPIRATORY: No shortness of breath, cough or sputum.  GASTROINTESTINAL: No anorexia, nausea, vomiting or diarrhea. No abdominal pain or blood.  GENITOURINARY: No burning on urination, no polyuria NEUROLOGICAL:+dizziness MUSCULOSKELETAL: No muscle, back pain, joint pain or stiffness.  LYMPHATICS: No enlarged nodes. No history of splenectomy.  PSYCHIATRIC: No history of depression or anxiety.  ENDOCRINOLOGIC: No reports of sweating, cold or heat intolerance. No polyuria or polydipsia.  Marland Kitchen   Physical Examination Vitals:   06/29/16 0927  BP: 136/77  Pulse: 64   Vitals:   06/29/16 0927  Weight: 240 lb 9.6 oz (109.1 kg)  Height: 5\' 8"  (1.727 m)    Gen: resting comfortably, no acute distress HEENT: no scleral icterus, pupils equal round and reactive, no palptable cervical adenopathy,  CV: RRR, no m/r/g, no jvd Resp: Clear to auscultation bilaterally GI: abdomen is soft, non-tender, non-distended, normal bowel sounds, no hepatosplenomegaly MSK: extremities are warm, no edema.  Skin: warm, no rash Neuro:  no focal deficits Psych: appropriate affect   Diagnostic Studies  02/2015 echo Interpretation Summary A limited two-dimensional transthoracic echocardiogram with color Doppler and Spectral Doppler was performed. The left ventricle is grossly normal size. The left ventricular ejection fraction is severely reduced (<25%). Septal motion is consistent with post-operative  state. The right ventricle is mildly dilated. The left atrium is severely dilated. There is mild (1+) tricuspid regurgitation. The aortic valve is not well visualized, but is grossly normal.  Left Ventricle The left ventricle is grossly normal size.The left ventricular ejection fraction is severely reduced (<25%). Septal motion is consistent with post- operative state.   Right Ventricle The right ventricle is mildly dilated.  Atria The left atrium is severely dilated.  Mitral Valve There is no mitral regurgitation. Surgical repair of mitral valve.   Tricuspid Valve There is mild (1+) tricuspid regurgitation. An annuloplasty ring is noted in the tricuspid position.  Aortic Valve The aortic valve is not well visualized, but is grossly normal.  Pulmonic Valve The pulmonic valve is not well visualized.  Vessels The  aortic root is not well visualized, but is probably normal.  Pericardium There is no pericardial effusion.  MMode/2D Measurements & Calculations IVSd: 1.0 cmLVIDd: 5.2 cm LVIDs: 4.5 cm LVPWd: 1.1 cm  _____________________________________________________________ LV mass(C)d: 219.3 gramsAo root diam: 2.9 cm  LV mass(C)dI: 99.1 grams/m2 Ao root area: 6.8 cm2 LA dimension: 5.2 cm  Doppler Measurements & Calculations TR max vel: 279.0 cm/sec TR max PG: 31.1 mmHg   02/2015 cath Hemodynamic monitoring:  Right atrium- 11 mm mercury Right ventricle- 46/11 Pulmonary artery- 45/23 with a mean pressure of 32 mm mercury and a saturation of 55% Pulmonary capillary wedge pressure- 16 mm mercury  Left heart catheterization, coronary angiography  Procedure details: Allen's test was normal. The right radial artery was draped in a sterile fashion. Local anesthetic was used  with lidocaine. We successfully placed a 6 French sheath in the right radial artery without complication. Verapamil was used to  prevent radial artery spasm.A 5 French tiger catheter was advanced over a long exchange J. tipped wire to the proximal ascending aorta. Heparin was then administered prophylactically. We guided a 5 [place] Tiger catheter over the aortic valve to  measure left ventricular pressures.A pull back was performed. We then manipulated the 5 [place] Tiger catheter to engage the right and left coronary arteries. Selective angiography was performed in multiple views. All catheters were removed over a wire.  An additional bolus of heparin was given prophylactically.A TR Band was used for hemostasis  Diagnostic findings:  Left coronary artery: Left main angiographically normal Left anterior descending is a large vessel that wraps the apex. There is a large diagonal one vessel. Mild irregularities noted in the LAD system Left circumflex artery is a large codominant artery. There is a large obtuse marginal one and a moderate-sized obtuse marginal 2 vessel. Mild irregularities noted in the circumflex system  Right coronary artery: Moderate sized codominant artery with no significant stenosis  Adverse events:The patient tolerated the procedure well with no complications noted  Left ventricular pressure 135/16 Aortic pressure 140/82 with a saturation of 84%   Calculations- cardiac index 2.73 Cardiac output 5.84  Assessment and Plan  1. Valvular heart disease - previous MV and TV repair - we discussed repeating her echo but cost is a concern at this time due to lack of insurance. We will purusue event monitor first due to her SOB and palpitations prior to echo to try to limit costs for her.   2. Afib - from previous notes looks like there were discussions about anticoag however patient was never committed to it.  - CHADS2Vasc score is 2, anticoag would be indicated.  -  she is s/p Maze procedure, reports recent palpitations. We will obtain 2 week monitor to see if she is having any recurrent afib. Readdress possibility of anticoag at next visit.  - EKG in clinic today shows sinus rhythm        Arnoldo Lenis, M.D.

## 2016-06-29 NOTE — Patient Instructions (Signed)
Your physician recommends that you schedule a follow-up appointment in: 6 WEEKS WITH DR BRANCH  Your physician recommends that you continue on your current medications as directed. Please refer to the Current Medication list given to you today.  Your physician has recommended that you wear an event monitor FOR 2 WEEKS. Event monitors are medical devices that record the heart's electrical activity. Doctors most often us these monitors to diagnose arrhythmias. Arrhythmias are problems with the speed or rhythm of the heartbeat. The monitor is a small, portable device. You can wear one while you do your normal daily activities. This is usually used to diagnose what is causing palpitations/syncope (passing out).  Thank you for choosing [facility]!!      

## 2016-07-06 ENCOUNTER — Ambulatory Visit (INDEPENDENT_AMBULATORY_CARE_PROVIDER_SITE_OTHER): Payer: Self-pay

## 2016-07-06 DIAGNOSIS — R002 Palpitations: Secondary | ICD-10-CM

## 2016-07-23 ENCOUNTER — Telehealth: Payer: Self-pay | Admitting: *Deleted

## 2016-07-23 NOTE — Telephone Encounter (Signed)
Notes Recorded by Laurine Blazer, LPN on 075-GRM at 075-GRM PM EDT Patient notified and verbalized understanding. Copy to pmd. Follow up already scheduled for 08/09/16 with Dr. Harl Bowie. ------  Notes Recorded by Arnoldo Lenis, MD on 07/21/2016 at 3:22 PM EDT Heart monitor overall looks good, no repeat episodes of afib. Will discuss further at our next f/u

## 2016-08-09 ENCOUNTER — Ambulatory Visit (INDEPENDENT_AMBULATORY_CARE_PROVIDER_SITE_OTHER): Payer: Self-pay | Admitting: Cardiology

## 2016-08-09 ENCOUNTER — Encounter: Payer: Self-pay | Admitting: Cardiology

## 2016-08-09 VITALS — BP 133/77 | HR 67 | Ht 68.0 in | Wt 236.0 lb

## 2016-08-09 DIAGNOSIS — Z23 Encounter for immunization: Secondary | ICD-10-CM

## 2016-08-09 DIAGNOSIS — R0602 Shortness of breath: Secondary | ICD-10-CM

## 2016-08-09 DIAGNOSIS — I4891 Unspecified atrial fibrillation: Secondary | ICD-10-CM

## 2016-08-09 DIAGNOSIS — R002 Palpitations: Secondary | ICD-10-CM

## 2016-08-09 DIAGNOSIS — Z9889 Other specified postprocedural states: Secondary | ICD-10-CM

## 2016-08-09 MED ORDER — GABAPENTIN (ONCE-DAILY) 300 MG PO TABS: 300.0000 mg | ORAL_TABLET | Freq: Two times a day (BID) | ORAL | 0 refills | Status: DC

## 2016-08-09 MED ORDER — METOPROLOL TARTRATE 25 MG PO TABS: 25.0000 mg | ORAL_TABLET | Freq: Every day | ORAL | 3 refills | Status: DC

## 2016-08-09 NOTE — Patient Instructions (Addendum)
Your physician recommends that you schedule a follow-up appointment in: 2 MONTHS WITH DR. Breda  Your physician has recommended you make the following change in your medication:   METOPROLOL 25 MG DAILY IN ADDITION TO METOPROLOL 100 MG DAILY   GABAPENTIN 300 MG ON THE FIRST DAY - THEN TAKE 300 MG TWICE DAILY  Your physician has requested that you have an echocardiogram. Echocardiography is a painless test that uses sound waves to create images of your heart. It provides your doctor with information about the size and shape of your heart and how well your heart's chambers and valves are working. This procedure takes approximately one hour. There are no restrictions for this procedure.   Thank you for choosing [facility]!!

## 2016-08-09 NOTE — Progress Notes (Signed)
Clinical Summary Toni [name] is a 60 y.o.female seen today for follow up for the following medical problems.   1. Mitral regurgitation - history of previous MV repair at [facility] 02/2015 - Radical Repair with resection of P2 Prolapse, 26 mm Physio II Ring Annuloplasty), Tricuspid Valve Repair (26 mm Ring Annuloplasty, Cox Maze IV Procedure on 02/27/2015 - cath prior to surgery showed no significant CAD - post repair echo 02/2015 showed no MR, mild TR.  - has had some recent SOB. Increased DOE over the last few months, at 1/2 block. Subjective abdominal distension. +orthopnea x 3 pillows which is chronic. Reports some weight gain.  - taking torsemide 20mg  daily, but will take additional 20 if needed.   - since last visit still with some SOB/DOE, overall unchanged.    2. Afib - reports recent palpitations, typically with activity but can occur at rest. Lasts just a few minutes. Occurs 1-2 times a week - can have sudden dizziness at times.  - has not been on anticoag.   - 06/2016 event monitor without evidence of afib, occasional PVCs.  - since last visit still with some palpitations. Occurs sporadically.  Past Medical History:  Diagnosis Date  . Chronic kidney disease, stage II (mild)   . Congestive heart failure, unspecified ([name])    Diastolic heart failure  . Degeneration of lumbar or lumbosacral intervertebral disc   . Dyspnea     chronic multifactorial large component of deconditioning  . Esophageal reflux   . Insomnia    Working third shift  . Mitral valve disorders    Mild to moderate mitral regurgitation  . Mitral valve insufficiency and aortic valve insufficiency   . Morbid obesity ([place])   . Normocytic anemia   . Other chest pain   . Other dyspnea and respiratory abnormality   . Paroxysmal atrial fibrillation ([name])    Stable no recurrence  . Peripheral edema    chronic  . Unspecified essential hypertension      Allergies  Allergen Reactions  .  Iodinated Diagnostic Agents      Current Outpatient Prescriptions  Medication Sig Dispense Refill  . aspirin 325 MG EC tablet Take 325 mg by mouth daily.    Marland Kitchen dimenhyDRINATE (DRAMAMINE) 50 MG tablet Take 50 mg by mouth at bedtime as needed (to help her sleep).    . diphenhydrAMINE (BENADRYL) 25 MG tablet Take 25 mg by mouth at bedtime as needed for sleep.     Marland Kitchen HYDROcodone-acetaminophen (NORCO) 7.5-325 MG tablet Take 1 tablet by mouth every 6 (six) hours as needed for moderate pain.    Marland Kitchen ibuprofen (ADVIL,MOTRIN) 200 MG tablet Take 800 mg by mouth every 8 (eight) hours as needed for mild pain. Alternates with Norco if needed    . lisinopril (PRINIVIL,ZESTRIL) 10 MG tablet Take 10 mg by mouth daily.    . magnesium oxide (MAG-OX) 400 MG tablet Take 400 mg by mouth 2 (two) times daily.    . metoprolol (LOPRESSOR) 100 MG tablet Take 100 mg by mouth 2 (two) times daily.    . pantoprazole (PROTONIX) 40 MG tablet Take 40 mg by mouth daily.      . potassium chloride SA (K-DUR,KLOR-CON) 20 MEQ tablet Take 20 mEq by mouth daily.     . ranitidine (ZANTAC) 300 MG capsule Take 300 mg by mouth every evening.    Marland Kitchen spironolactone (ALDACTONE) 25 MG tablet Take 25 mg by mouth daily.    Marland Kitchen torsemide (DEMADEX)  20 MG tablet Take 40 mg by mouth daily.      No current facility-administered medications for this visit.      Past Surgical History:  Procedure Laterality Date  . APPENDECTOMY    . CHOLECYSTECTOMY    . TOTAL ABDOMINAL HYSTERECTOMY    . TOTAL KNEE ARTHROPLASTY       Allergies  Allergen Reactions  . Iodinated Diagnostic Agents       Family History  Problem Relation Age of Onset  . Cancer    . Stroke    . Diabetes    . Hyperlipidemia    . Hypertension       Social History Toni [name] reports that she quit smoking about 20 years ago. Her smoking use included Cigarettes. She has a 11.50 pack-year smoking history. She has never used smokeless tobacco. Toni [name] has no alcohol history on  file.   Review of Systems CONSTITUTIONAL: No weight loss, fever, chills, weakness or fatigue.  HEENT: Eyes: No visual loss, blurred vision, double vision or yellow sclerae.No hearing loss, sneezing, congestion, runny nose or sore throat.  SKIN: No rash or itching.  CARDIOVASCULAR: per HPI RESPIRATORY: per HPI GASTROINTESTINAL: No anorexia, nausea, vomiting or diarrhea. No abdominal pain or blood.  GENITOURINARY: No burning on urination, no polyuria NEUROLOGICAL: No headache, dizziness, syncope, paralysis, ataxia, numbness or tingling in the extremities. No change in bowel or bladder control.  MUSCULOSKELETAL: No muscle, back pain, joint pain or stiffness.  LYMPHATICS: No enlarged nodes. No history of splenectomy.  PSYCHIATRIC: No history of depression or anxiety.  ENDOCRINOLOGIC: No reports of sweating, cold or heat intolerance. No polyuria or polydipsia.  Marland Kitchen   Physical Examination Vitals:   08/09/16 1259  BP: 133/77  Pulse: 67   Vitals:   08/09/16 1259  Weight: 236 lb (107 kg)  Height: 5\' 8"  (1.727 m)    Gen: resting comfortably, no acute distress HEENT: no scleral icterus, pupils equal round and reactive, no palptable cervical adenopathy,  CV: RRR, no m/r/g, no jvd Resp: Clear to auscultation bilaterally GI: abdomen is soft, non-tender, non-distended, normal bowel sounds, no hepatosplenomegaly MSK: extremities are warm, no edema.  Skin: warm, no rash Neuro:  no focal deficits Psych: appropriate affect   Diagnostic Studies 02/2015 echo Interpretation Summary A limited two-dimensional transthoracic echocardiogram with color Doppler and Spectral Doppler was performed. The left ventricle is grossly normal size. The left ventricular ejection fraction is severely reduced (<25%). Septal motion is consistent with post-operative state. The right ventricle is mildly dilated. The left atrium is severely dilated. There is mild (1+) tricuspid regurgitation. The aortic valve is  not well visualized, but is grossly normal.  Left Ventricle The left ventricle is grossly normal size.The left ventricular ejection fraction is severely reduced (<25%). Septal motion is consistent with post- operative state.   Right Ventricle The right ventricle is mildly dilated.  Atria The left atrium is severely dilated.  Mitral Valve There is no mitral regurgitation. Surgical repair of mitral valve.   Tricuspid Valve There is mild (1+) tricuspid regurgitation. An annuloplasty ring is noted in the tricuspid position.  Aortic Valve The aortic valve is not well visualized, but is grossly normal.  Pulmonic Valve The pulmonic valve is not well visualized.  Vessels The aortic root is not well visualized, but is probably normal.  Pericardium There is no pericardial effusion.  MMode/2D Measurements & Calculations IVSd: 1.0 cmLVIDd: 5.2 cm LVIDs: 4.5 cm LVPWd: 1.1 cm  _____________________________________________________________ LV mass(C)d: 219.3 gramsAo root  diam: 2.9 cm  LV mass(C)dI: 99.1 grams/m2 Ao root area: 6.8 cm2 LA dimension: 5.2 cm  Doppler Measurements & Calculations TR max vel: 279.0 cm/sec TR max PG: 31.1 mmHg   02/2015 cath Hemodynamic monitoring:  Right atrium- 11 mm mercury Right ventricle- 46/11 Pulmonary artery- 45/23 with a mean pressure of 32 mm mercury and a saturation of 55% Pulmonary capillary wedge pressure- 16 mm mercury  Left heart catheterization, coronary angiography  Procedure details: Allen's test was normal. The right radial artery was draped in a sterile fashion. Local anesthetic was used with lidocaine. We successfully placed a 6 French sheath in the right radial artery without complication. Verapamil was used to  prevent radial  artery spasm.A 5 French tiger catheter was advanced over a long exchange J. tipped wire to the proximal ascending aorta. Heparin was then administered prophylactically. We guided a 5 [place] Tiger catheter over the aortic valve to  measure left ventricular pressures.A pull back was performed. We then manipulated the 5 [place] Tiger catheter to engage the right and left coronary arteries. Selective angiography was performed in multiple views. All catheters were removed over a wire.  An additional bolus of heparin was given prophylactically.A TR Band was used for hemostasis  Diagnostic findings:  Left coronary artery: Left main angiographically normal Left anterior descending is a large vessel that wraps the apex. There is a large diagonal one vessel. Mild irregularities noted in the LAD system Left circumflex artery is a large codominant artery. There is a large obtuse marginal one and a moderate-sized obtuse marginal 2 vessel. Mild irregularities noted in the circumflex system  Right coronary artery: Moderate sized codominant artery with no significant stenosis  Adverse events:The patient tolerated the procedure well with no complications noted  Left ventricular pressure 135/16 Aortic pressure 140/82 with a saturation of 84%   Calculations- cardiac index 2.73 Cardiac output 5.84  06/2016 Event monitor No significant arrhythmias   Assessment and Plan  1. Valvular heart disease - previous MV and TV repair - with ongoing SOB/DOE we will repeat echo.   2. Afib - from previous notes looks like there were discussions about anticoag however patient was never committed to it.  - CHADS2Vasc score is 2, anticoag would be indicated.  - she is s/p Maze procedure, reports recent palpitations. - monitor did not show any recurrence of afib, did have occasioanl palpitations - will increase lopressor to 125mg  bid, can take additional 25mg  prn palpitations  3. Sternal pain - neuropathic  pain at site of sternotomy - will start gabapentin 300mg  x1 day, then 300mg  bid.         Arnoldo Lenis, M.D.

## 2016-08-19 ENCOUNTER — Telehealth: Payer: Self-pay | Admitting: Cardiology

## 2016-08-19 MED ORDER — GABAPENTIN 300 MG PO CAPS: ORAL_CAPSULE | ORAL | 3 refills | Status: DC

## 2016-08-19 NOTE — Telephone Encounter (Signed)
Toni [name] called stating that the new medications Dr. Harl Bowie put her on are too expensive.

## 2016-08-19 NOTE — Telephone Encounter (Signed)
Patient called back

## 2016-08-19 NOTE — Telephone Encounter (Signed)
Pt requesting 30 day supply of Gabapentin says 90 day would cost to much. Medication sent to pharmacy.

## 2016-08-27 ENCOUNTER — Ambulatory Visit (INDEPENDENT_AMBULATORY_CARE_PROVIDER_SITE_OTHER): Payer: Self-pay

## 2016-08-27 ENCOUNTER — Other Ambulatory Visit: Payer: Self-pay

## 2016-08-27 DIAGNOSIS — R0602 Shortness of breath: Secondary | ICD-10-CM

## 2016-08-31 ENCOUNTER — Ambulatory Visit: Payer: Self-pay | Admitting: Physician Assistant

## 2016-08-31 ENCOUNTER — Encounter: Payer: Self-pay | Admitting: Physician Assistant

## 2016-08-31 ENCOUNTER — Telehealth: Payer: Self-pay | Admitting: *Deleted

## 2016-08-31 VITALS — BP 142/80 | HR 62 | Temp 97.9°F | Ht 64.75 in | Wt 238.0 lb

## 2016-08-31 DIAGNOSIS — I38 Endocarditis, valve unspecified: Secondary | ICD-10-CM

## 2016-08-31 DIAGNOSIS — G8929 Other chronic pain: Secondary | ICD-10-CM

## 2016-08-31 DIAGNOSIS — M545 Low back pain: Secondary | ICD-10-CM

## 2016-08-31 DIAGNOSIS — Z1239 Encounter for other screening for malignant neoplasm of breast: Secondary | ICD-10-CM

## 2016-08-31 DIAGNOSIS — I10 Essential (primary) hypertension: Secondary | ICD-10-CM

## 2016-08-31 DIAGNOSIS — M79672 Pain in left foot: Secondary | ICD-10-CM

## 2016-08-31 DIAGNOSIS — Z862 Personal history of diseases of the blood and blood-forming organs and certain disorders involving the immune mechanism: Secondary | ICD-10-CM

## 2016-08-31 DIAGNOSIS — Z131 Encounter for screening for diabetes mellitus: Secondary | ICD-10-CM

## 2016-08-31 DIAGNOSIS — Z13220 Encounter for screening for lipoid disorders: Secondary | ICD-10-CM

## 2016-08-31 LAB — GLUCOSE, POCT (MANUAL RESULT ENTRY): POC Glucose: 89 mg/dl (ref 70–99)

## 2016-08-31 MED ORDER — OMEPRAZOLE 40 MG PO CPDR: 40.0000 mg | DELAYED_RELEASE_CAPSULE | Freq: Every day | ORAL | 1 refills | Status: DC

## 2016-08-31 MED ORDER — METOPROLOL TARTRATE 100 MG PO TABS: 100.0000 mg | ORAL_TABLET | Freq: Two times a day (BID) | ORAL | 1 refills | Status: DC

## 2016-08-31 MED ORDER — LISINOPRIL 10 MG PO TABS: 10.0000 mg | ORAL_TABLET | Freq: Every day | ORAL | 1 refills | Status: DC

## 2016-08-31 MED ORDER — FLUOXETINE HCL 20 MG PO CAPS: 20.0000 mg | ORAL_CAPSULE | Freq: Every day | ORAL | 1 refills | Status: DC

## 2016-08-31 NOTE — Telephone Encounter (Signed)
-----   Message from Arnoldo Lenis, MD sent at 08/31/2016  1:49 PM EDT ----- Echo does show some increased pressure across her repaired heart valves that could be causing some of her SOB. I'd like to discuss some additional testing to better evaluate the valves. Is she able to come in one day over the next few weeks to discuss?  [facility]

## 2016-08-31 NOTE — Patient Instructions (Signed)
-  go for Labs/bloodwork -we will call to schedule mammogram -turn in [facility] discount application -

## 2016-08-31 NOTE — Telephone Encounter (Signed)
Pt aware and will come tomorrow at 340 pm will add to schedule

## 2016-08-31 NOTE — Progress Notes (Signed)
BP (!) 142/80 (BP Location: Left Arm, Patient Position: Sitting, Cuff Size: Normal)   Pulse 62   Temp 97.9 F (36.6 C)   Ht 5' 4.75" (1.645 m)   Wt 238 lb (108 kg)   SpO2 90%   BMI 39.91 kg/m    Subjective:    Patient ID: Toni [name], female    DOB: 1956-06-11, 60 y.o.   MRN: [id number]  HPI: Toni [name] is a 60 y.o. female presenting on 08/31/2016 for New Patient (Initial Visit) (previous patient with Dr. Woody Seller pt states she cannot afford to go to him anymore)   HPI   Pt states insurance ran out around august last year.  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  She never returned to work after her heart surgery.    Pt was taking protonix for gerd but changed to ranitidine due to cost.  She says now it sometimes feels uncomfortable when she swallows.  She did not have that feeling when she was taking the protonix.   She had colonoscopy done she thinks in 2014.  She says she had a few polyps.  It was done at [facility].    Her last mammo was done 2015 at [facility].   Pt says lately she's "had the gout"  Pt saw dr Harl Bowie earlier this month for her heart conditions   Relevant past medical, surgical, family and social history reviewed and updated as indicated. Interim medical history since our last visit reviewed. Allergies and medications reviewed and updated.   Current Outpatient Prescriptions:  .  aspirin 325 MG EC tablet, Take 325 mg by mouth daily., Disp: , Rfl:  .  dimenhyDRINATE (DRAMAMINE) 50 MG tablet, Take 50 mg by mouth at bedtime as needed (to help her sleep)., Disp: , Rfl:  .  diphenhydrAMINE (BENADRYL) 25 MG tablet, Take 25 mg by mouth at bedtime as needed for sleep. , Disp: , Rfl:  .  FLUoxetine (PROZAC) 20 MG capsule, Take 20 mg by mouth daily., Disp: , Rfl:  .  gabapentin (NEURONTIN) 300 MG capsule, TAKE 1 TAB ON 1ST DAY THEN TAKE 1 TAB TWICE DAILY, Disp: 60 capsule, Rfl: 3 .  ibuprofen (ADVIL,MOTRIN) 200 MG tablet, Take 800 mg by mouth every 8 (eight) hours  as needed for mild pain. Alternates with Norco if needed, Disp: , Rfl:  .  lisinopril (PRINIVIL,ZESTRIL) 10 MG tablet, Take 10 mg by mouth daily., Disp: , Rfl:  .  magnesium oxide (MAG-OX) 400 MG tablet, Take 400 mg by mouth 2 (two) times daily., Disp: , Rfl:  .  metoprolol (LOPRESSOR) 100 MG tablet, Take 100 mg by mouth 2 (two) times daily., Disp: , Rfl:  .  metoprolol tartrate (LOPRESSOR) 25 MG tablet, Take 1 tablet (25 mg total) by mouth daily., Disp: 90 tablet, Rfl: 3 .  potassium chloride SA (K-DUR,KLOR-CON) 20 MEQ tablet, Take 99 mEq by mouth daily. , Disp: , Rfl:  .  ranitidine (ZANTAC) 300 MG capsule, Take 300 mg by mouth every evening., Disp: , Rfl:  .  spironolactone (ALDACTONE) 25 MG tablet, Take 25 mg by mouth daily., Disp: , Rfl:  .  torsemide (DEMADEX) 20 MG tablet, Take 20 mg by mouth daily. , Disp: , Rfl:  .  HYDROcodone-acetaminophen (NORCO) 7.5-325 MG tablet, Take 1 tablet by mouth every 6 (six) hours as needed for moderate pain., Disp: , Rfl:    Review of Systems  Constitutional: Positive for diaphoresis and fatigue. Negative for appetite  change, chills, fever and unexpected weight change.  HENT: Positive for dental problem. Negative for congestion, drooling, ear pain, facial swelling, hearing loss, mouth sores, sneezing, sore throat, trouble swallowing and voice change.   Eyes: Negative for pain, discharge, redness, itching and visual disturbance.  Respiratory: Positive for shortness of breath and wheezing. Negative for cough and choking.   Cardiovascular: Positive for palpitations and leg swelling. Negative for chest pain.  Gastrointestinal: Positive for constipation and diarrhea. Negative for abdominal pain, blood in stool and vomiting.  Endocrine: Positive for cold intolerance and polydipsia. Negative for heat intolerance.  Genitourinary: Negative for decreased urine volume, dysuria and hematuria.  Musculoskeletal: Positive for arthralgias, back pain and gait problem.    Skin: Negative for rash.  Allergic/Immunologic: Negative for environmental allergies.  Neurological: Negative for seizures, syncope, light-headedness and headaches.  Hematological: Negative for adenopathy.  Psychiatric/Behavioral: Positive for dysphoric mood. Negative for agitation and suicidal ideas. The patient is nervous/anxious.     Per HPI unless specifically indicated above     Objective:    BP (!) 142/80 (BP Location: Left Arm, Patient Position: Sitting, Cuff Size: Normal)   Pulse 62   Temp 97.9 F (36.6 C)   Ht 5' 4.75" (1.645 m)   Wt 238 lb (108 kg)   SpO2 90%   BMI 39.91 kg/m   Wt Readings from Last 3 Encounters:  08/31/16 238 lb (108 kg)  08/09/16 236 lb (107 kg)  06/29/16 240 lb 9.6 oz (109.1 kg)    Physical Exam  Constitutional: She is oriented to person, place, and time. She appears well-developed and well-nourished.  HENT:  Head: Normocephalic and atraumatic.  Mouth/Throat: Oropharynx is clear and moist. No oropharyngeal exudate.  Eyes: Conjunctivae and EOM are normal. Pupils are equal, round, and reactive to light.  Neck: Neck supple. No thyromegaly present.  Cardiovascular: Normal rate and regular rhythm.   rrr with occ premature beat  Pulmonary/Chest: Effort normal and breath sounds normal.  Abdominal: Soft. Bowel sounds are normal. She exhibits no mass. There is no hepatosplenomegaly. There is no tenderness.  Musculoskeletal: She exhibits no edema.       Right foot: Normal.       Left foot: There is tenderness (mild). There is no bony tenderness.       Feet:  Lymphadenopathy:    She has no cervical adenopathy.  Neurological: She is alert and oriented to person, place, and time. Gait normal.  Skin: Skin is warm and dry.  Psychiatric: She has a normal mood and affect. Her behavior is normal.  Vitals reviewed.     Results for orders placed or performed in visit on 08/31/16  POCT Glucose (CBG)  Result Value Ref Range   POC Glucose 89 70 - 99  mg/dl      Assessment & Plan:   Encounter Diagnoses  Name Primary?  . Hypertension, unspecified type Yes  . Chronic low back pain, unspecified back pain laterality, with sciatica presence unspecified   . Left foot pain   . Valvular heart disease   . Screening cholesterol level   . History of anemia   . Screening for diabetes mellitus (DM)   . Screening for breast cancer     -order screening mammogram -record requested for most recent colonoscopy -get pt signed up for [facility] to help obtain many of her medications at no cost -gave pt [facility] discount application -change back to omeprazole to replace ranitidine -get baseline labs drawn tomorrow morning (fasting) -discussed with pt  that [facility] is not a pain clinic and cannot prescribe narcotics nor manage her chronic pain.  Recommended that she find a local pain clinic for her chronic pain -follow up in 3 weeks.  RTO sooner prn

## 2016-09-01 ENCOUNTER — Ambulatory Visit (INDEPENDENT_AMBULATORY_CARE_PROVIDER_SITE_OTHER): Payer: Self-pay | Admitting: Cardiology

## 2016-09-01 ENCOUNTER — Encounter: Payer: Self-pay | Admitting: *Deleted

## 2016-09-01 ENCOUNTER — Other Ambulatory Visit: Payer: Self-pay

## 2016-09-01 ENCOUNTER — Encounter: Payer: Self-pay | Admitting: Cardiology

## 2016-09-01 VITALS — BP 160/95 | HR 60 | Ht 68.0 in | Wt 238.4 lb

## 2016-09-01 DIAGNOSIS — I4891 Unspecified atrial fibrillation: Secondary | ICD-10-CM

## 2016-09-01 DIAGNOSIS — Z9889 Other specified postprocedural states: Secondary | ICD-10-CM

## 2016-09-01 DIAGNOSIS — R0602 Shortness of breath: Secondary | ICD-10-CM

## 2016-09-01 LAB — CBC WITH DIFFERENTIAL/PLATELET
Basophils Absolute: 0 cells/uL (ref 0–200)
Basophils Relative: 0 %
Eosinophils Absolute: 52 cells/uL (ref 15–500)
Eosinophils Relative: 1 %
HCT: 35.6 % (ref 35.0–45.0)
Hemoglobin: 11.5 g/dL — ABNORMAL LOW (ref 11.7–15.5)
Lymphocytes Relative: 20 %
Lymphs Abs: 1040 cells/uL (ref 850–3900)
MCH: 30.3 pg (ref 27.0–33.0)
MCHC: 32.3 g/dL (ref 32.0–36.0)
MCV: 93.7 fL (ref 80.0–100.0)
MPV: 10.7 fL (ref 7.5–12.5)
Monocytes Absolute: 520 cells/uL (ref 200–950)
Monocytes Relative: 10 %
Neutro Abs: 3588 cells/uL (ref 1500–7800)
Neutrophils Relative %: 69 %
Platelets: 161 10*3/uL (ref 140–400)
RBC: 3.8 MIL/uL (ref 3.80–5.10)
RDW: 14.7 % (ref 11.0–15.0)
WBC: 5.2 10*3/uL (ref 3.8–10.8)

## 2016-09-01 LAB — URIC ACID: Uric Acid, Serum: 7.5 mg/dL — ABNORMAL HIGH (ref 2.5–7.0)

## 2016-09-01 LAB — LIPID PANEL
Cholesterol: 141 mg/dL (ref 125–200)
HDL: 43 mg/dL — ABNORMAL LOW (ref >=46)
LDL Cholesterol: 84 mg/dL (ref <130)
Total CHOL/HDL Ratio: 3.3 Ratio (ref <=5.0)
Triglycerides: 68 mg/dL (ref <150)
VLDL: 14 mg/dL (ref <30)

## 2016-09-01 LAB — COMPREHENSIVE METABOLIC PANEL
ALT: 23 U/L (ref 6–29)
AST: 25 U/L (ref 10–35)
Albumin: 3.9 g/dL (ref 3.6–5.1)
Alkaline Phosphatase: 105 U/L (ref 33–130)
BUN: 26 mg/dL — ABNORMAL HIGH (ref 7–25)
CO2: 26 mmol/L (ref 20–31)
Calcium: 9.6 mg/dL (ref 8.6–10.4)
Chloride: 108 mmol/L (ref 98–110)
Creat: 1.43 mg/dL — ABNORMAL HIGH (ref 0.50–0.99)
Glucose, Bld: 82 mg/dL (ref 65–99)
Potassium: 4.8 mmol/L (ref 3.5–5.3)
Sodium: 143 mmol/L (ref 135–146)
Total Bilirubin: 0.4 mg/dL (ref 0.2–1.2)
Total Protein: 6.8 g/dL (ref 6.1–8.1)

## 2016-09-01 MED ORDER — GABAPENTIN 300 MG PO CAPS: ORAL_CAPSULE | ORAL | 3 refills | Status: DC

## 2016-09-01 NOTE — Progress Notes (Signed)
Clinical Summary Ms. Tabet is a 60 y.o.female seen today for follow up of the following medical problems.  1. Mitral regurgitation - history of previous MV repair at [facility] 02/2015 - Radical Repair with resection of P2 Prolapse, 26 mm Physio II Ring Annuloplasty, Tricuspid Valve Repair (26 mm Ring Annuloplasty, Cox Maze IV Procedure on 02/27/2015 - cath prior to surgery showed no significant CAD - post repair echo 02/2015 showed no MR, mild TR. There are no reported gradients across the valves in the report - has had some recent SOB. Increased DOE over the last few months, at 1/2 block. Subjective abdominal distension. +orthopnea x 3 pillows which is chronic. Reports some weight gain.  - taking torsemide 20mg  daily, but will take additional 20 if needed.   - since last visit still with some SOB/DOE, overall unchanged.  - echo showed elevated gradients across her repaired MV and TV consistent with post repair stenosis.    2. Afib - reports recent palpitations, typically with activity but can occur at rest. Lasts just a few minutes. Occurs 1-2 times a week - can have sudden dizziness at times.  - has not been on anticoag.  - 06/2016 event monitor without evidence of afib, occasional PVCs.  - since last visit still with some palpitations at times   Past Medical History:  Diagnosis Date  . Congestive heart failure, unspecified    Diastolic heart failure  . Degeneration of lumbar or lumbosacral intervertebral disc   . Depression   . Dyspnea     chronic multifactorial large component of deconditioning  . Esophageal reflux   . Insomnia    Working third shift  . Mitral valve disorders(424.0)    Mild to moderate mitral regurgitation  . Mitral valve insufficiency and aortic valve insufficiency   . Morbid obesity ([place])   . Normocytic anemia   . Other chest pain   . Other dyspnea and respiratory abnormality   . Paroxysmal atrial fibrillation ([name])    Stable no  recurrence  . Peripheral edema    chronic  . Unspecified essential hypertension      Allergies  Allergen Reactions  . Iodinated Diagnostic Agents      Current Outpatient Prescriptions  Medication Sig Dispense Refill  . aspirin 325 MG EC tablet Take 325 mg by mouth daily.    Marland Kitchen dimenhyDRINATE (DRAMAMINE) 50 MG tablet Take 50 mg by mouth at bedtime as needed (to help her sleep).    . diphenhydrAMINE (BENADRYL) 25 MG tablet Take 25 mg by mouth at bedtime as needed for sleep.     Marland Kitchen FLUoxetine (PROZAC) 20 MG capsule Take 1 capsule (20 mg total) by mouth daily. 90 capsule 1  . gabapentin (NEURONTIN) 300 MG capsule TAKE 1 TAB ON 1ST DAY THEN TAKE 1 TAB TWICE DAILY 60 capsule 3  . HYDROcodone-acetaminophen (NORCO) 7.5-325 MG tablet Take 1 tablet by mouth every 6 (six) hours as needed for moderate pain.    Marland Kitchen ibuprofen (ADVIL,MOTRIN) 200 MG tablet Take 800 mg by mouth every 8 (eight) hours as needed for mild pain. Alternates with Norco if needed    . lisinopril (PRINIVIL,ZESTRIL) 10 MG tablet Take 1 tablet (10 mg total) by mouth daily. 90 tablet 1  . magnesium oxide (MAG-OX) 400 MG tablet Take 400 mg by mouth 2 (two) times daily.    . metoprolol (LOPRESSOR) 100 MG tablet Take 1 tablet (100 mg total) by mouth 2 (two) times daily. 180 tablet 1  .  metoprolol tartrate (LOPRESSOR) 25 MG tablet Take 1 tablet (25 mg total) by mouth daily. 90 tablet 3  . omeprazole (PRILOSEC) 40 MG capsule Take 1 capsule (40 mg total) by mouth daily. 90 capsule 1  . potassium chloride SA (K-DUR,KLOR-CON) 20 MEQ tablet Take 99 mEq by mouth daily.     . ranitidine (ZANTAC) 300 MG capsule Take 300 mg by mouth every evening.    Marland Kitchen spironolactone (ALDACTONE) 25 MG tablet Take 25 mg by mouth daily.    Marland Kitchen torsemide (DEMADEX) 20 MG tablet Take 20 mg by mouth daily.      No current facility-administered medications for this visit.      Past Surgical History:  Procedure Laterality Date  . APPENDECTOMY    . BREAST BIOPSY Left  2009  . CARDIAC VALVE SURGERY  02/27/2015  . CHOLECYSTECTOMY    . HEEL SPUR SURGERY Right   . TOTAL ABDOMINAL HYSTERECTOMY    . TOTAL KNEE ARTHROPLASTY Right   . TUBAL LIGATION       Allergies  Allergen Reactions  . Iodinated Diagnostic Agents       Family History  Problem Relation Age of Onset  . Hypertension Mother   . Heart disease Mother   . Kidney disease Father   . Stroke Father   . Diabetes Sister   . Kidney disease Sister   . Heart disease Sister   . Hypertension Sister   . Heart disease Brother   . Heart attack Son   . Hypertension Son   . Cancer Paternal Grandmother     Breast Cancer  . Heart disease Brother      Social History Ms. Ringold reports that she quit smoking about 20 years ago. Her smoking use included Cigarettes. She has a 12.50 pack-year smoking history. She quit smokeless tobacco use about 42 years ago. Her smokeless tobacco use included Chew. Ms. Sharber reports that she does not drink alcohol.   Review of Systems CONSTITUTIONAL: No weight loss, fever, chills, weakness or fatigue.  HEENT: Eyes: No visual loss, blurred vision, double vision or yellow sclerae.No hearing loss, sneezing, congestion, runny nose or sore throat.  SKIN: No rash or itching.  CARDIOVASCULAR: per HPI RESPIRATORY: No shortness of breath, cough or sputum.  GASTROINTESTINAL: No anorexia, nausea, vomiting or diarrhea. No abdominal pain or blood.  GENITOURINARY: No burning on urination, no polyuria NEUROLOGICAL: No headache, dizziness, syncope, paralysis, ataxia, numbness or tingling in the extremities. No change in bowel or bladder control.  MUSCULOSKELETAL: No muscle, back pain, joint pain or stiffness.  LYMPHATICS: No enlarged nodes. No history of splenectomy.  PSYCHIATRIC: No history of depression or anxiety.  ENDOCRINOLOGIC: No reports of sweating, cold or heat intolerance. No polyuria or polydipsia.  Marland Kitchen   Physical Examination Vitals:   09/01/16 1545  BP: (!) 160/95    Pulse: 60   Vitals:   09/01/16 1545  Weight: 238 lb 6.4 oz (108.1 kg)  Height: 5\' 8"  (1.727 m)    Gen: resting comfortably, no acute distress HEENT: no scleral icterus, pupils equal round and reactive, no palptable cervical adenopathy,  CV: RRR, no m/r/g, no jvd Resp: Clear to auscultation bilaterally GI: abdomen is soft, non-tender, non-distended, normal bowel sounds, no hepatosplenomegaly MSK: extremities are warm, no edema.  Skin: warm, no rash Neuro:  no focal deficits Psych: appropriate affect      Assessment and Plan  1. Valvular heart disease - previous MV and TV repair - with ongoing SOB/DOE. - recent TTE shows  elevated gradients across both valves, cannot distinnguish anatomy by TTE - pursue TEE once she established with cone assistance program   2. Afib - from previous notes looks like there were discussions about anticoag however patient was never committed to it.  - CHADS2Vasc score is 2, anticoag would be indicated.  - she is s/p Maze procedure, reports recent palpitations. - monitor did not show any recurrence of afib - continue to monitor  3. Sternal pain - neuropathic pain at site of sternotomy - improving but not resolved on gabapentin.Increase to 300mg  in AM and 600mg  pm. No increase in daytime dose as it makes her sleepy.        Alphonse Guild.

## 2016-09-01 NOTE — Patient Instructions (Signed)
Your physician recommends that you schedule a follow-up appointment in: 3 MONTHS WITH DR. [place]  Your physician has recommended you make the following change in your medication:   TAKE GABAPENTIN 300 MG IN THE MORNING AND 600 MG IN THE EVENING   PLEASE CALL OUR OFFICE WHEN YOU KNOW THE STATUS OF YOUR CONE ASSISTANCE.   Thank you for choosing [facility]!!

## 2016-09-02 LAB — HEMOGLOBIN A1C
Hgb A1c MFr Bld: 5.4 % (ref <5.7)
Mean Plasma Glucose: 108 mg/dL

## 2016-09-16 ENCOUNTER — Telehealth: Payer: Self-pay | Admitting: Cardiology

## 2016-09-16 NOTE — Telephone Encounter (Signed)
Toni [name] called stating that she has been approved 100% cone and that she can now schedule her TEE.  She said that 09/23/16 is not a good day for her.

## 2016-09-16 NOTE — Telephone Encounter (Signed)
Routed to schedulers - pt aware that may not get a return call until next week.

## 2016-09-22 ENCOUNTER — Encounter: Payer: Self-pay | Admitting: Physician Assistant

## 2016-09-22 ENCOUNTER — Ambulatory Visit: Payer: Self-pay | Admitting: Physician Assistant

## 2016-09-22 ENCOUNTER — Encounter: Payer: Self-pay | Admitting: *Deleted

## 2016-09-22 VITALS — BP 126/72 | HR 67 | Temp 97.3°F | Ht 65.5 in | Wt 237.6 lb

## 2016-09-22 DIAGNOSIS — M109 Gout, unspecified: Secondary | ICD-10-CM

## 2016-09-22 DIAGNOSIS — N189 Chronic kidney disease, unspecified: Secondary | ICD-10-CM

## 2016-09-22 DIAGNOSIS — G8929 Other chronic pain: Secondary | ICD-10-CM

## 2016-09-22 DIAGNOSIS — I38 Endocarditis, valve unspecified: Secondary | ICD-10-CM

## 2016-09-22 DIAGNOSIS — Z860100 Personal history of colon polyps, unspecified: Secondary | ICD-10-CM

## 2016-09-22 DIAGNOSIS — I10 Essential (primary) hypertension: Secondary | ICD-10-CM

## 2016-09-22 DIAGNOSIS — M545 Low back pain: Secondary | ICD-10-CM

## 2016-09-22 MED ORDER — INDOMETHACIN 25 MG PO CAPS: ORAL_CAPSULE | ORAL | 1 refills | Status: DC

## 2016-09-22 NOTE — Patient Instructions (Addendum)
Low-Purine Diet Purines are compounds that affect the level of uric acid in your body. A low-purine diet is a diet that is low in purines. Eating a low-purine diet can prevent the level of uric acid in your body from getting too high and causing gout or kidney stones or both. What do I need to know about this diet?  Choose low-purine foods. Examples of low-purine foods are listed in the next section.  Drink plenty of fluids, especially water. Fluids can help remove uric acid from your body. Try to drink 8-16 cups (1.9-3.8 L) a day.  Limit foods high in fat, especially saturated fat, as fat makes it harder for the body to get rid of uric acid. Foods high in saturated fat include pizza, cheese, ice cream, whole milk, fried foods, and gravies. Choose foods that are lower in fat and lean sources of protein. Use olive oil when cooking as it contains healthy fats that are not high in saturated fat.  Limit alcohol. Alcohol interferes with the elimination of uric acid from your body. If you are having a gout attack, avoid all alcohol.  Keep in mind that different people's bodies react differently to different foods. You will probably learn over time which foods do or do not affect you. If you discover that a food tends to cause your gout to flare up, avoid eating that food. You can more freely enjoy foods that do not cause problems. If you have any questions about a food item, talk to your dietitian or health care provider. Which foods are low, moderate, and high in purines? The following is a list of foods that are low, moderate, and high in purines. You can eat any amount of the foods that are low in purines. You may be able to have small amounts of foods that are moderate in purines. Ask your health care provider how much of a food moderate in purines you can have. Avoid foods high in purines. Grains  Foods low in purines: Enriched white bread, pasta, rice, cake, cornbread, popcorn.  Foods moderate in  purines: Whole-grain breads and cereals, wheat germ, bran, oatmeal. Uncooked oatmeal. Dry wheat bran or wheat germ.  Foods high in purines: Pancakes, French toast, biscuits, muffins. Vegetables  Foods low in purines: All vegetables, except those that are moderate in purines.  Foods moderate in purines: Asparagus, cauliflower, spinach, mushrooms, green peas. Fruits  All fruits are low in purines. Meats and other Protein Foods  Foods low in purines: Eggs, nuts, peanut butter.  Foods moderate in purines: 80-90% lean beef, lamb, veal, pork, poultry, fish, eggs, peanut butter, nuts. Crab, lobster, oysters, and shrimp. Cooked dried beans, peas, and lentils.  Foods high in purines: Anchovies, sardines, herring, mussels, tuna, codfish, scallops, trout, and haddock. Bacon. Organ meats (such as liver or kidney). Tripe. Game meat. Goose. Sweetbreads. Dairy  All dairy foods are low in purines. Low-fat and fat-free dairy products are best because they are low in saturated fat. Beverages  Drinks low in purines: Water, carbonated beverages, tea, coffee, cocoa.  Drinks moderate in purines: Soft drinks and other drinks sweetened with high-fructose corn syrup. Juices. To find whether a food or drink is sweetened with high-fructose corn syrup, look at the ingredients list.  Drinks high in purines: Alcoholic beverages (such as beer). Condiments  Foods low in purines: Salt, herbs, olives, pickles, relishes, vinegar.  Foods moderate in purines: Butter, margarine, oils, mayonnaise. Fats and Oils  Foods low in purines: All types, except gravies   and sauces made with meat.  Foods high in purines: Gravies and sauces made with meat. Other Foods  Foods low in purines: Sugars, sweets, gelatin. Cake. Soups made without meat.  Foods moderate in purines: Meat-based or fish-based soups, broths, or bouillons. Foods and drinks sweetened with high-fructose corn syrup.  Foods high in purines: High-fat desserts  (such as ice cream, cookies, cakes, pies, doughnuts, and chocolate). Contact your dietitian for more information on foods that are not listed here.  This information is not intended to replace advice given to you by your health care provider. Make sure you discuss any questions you have with your health care provider. Document Released: 02/19/2011 Document Revised: 04/01/2016 Document Reviewed: 10/01/2013 Elsevier Interactive Patient Education  2017 [place].    Back Exercises The following exercises strengthen the muscles that help to support the back. They also help to keep the lower back flexible. Doing these exercises can help to prevent back pain or lessen existing pain. If you have back pain or discomfort, try doing these exercises 2-3 times each day or as told by your health care provider. When the pain goes away, do them once each day, but increase the number of times that you repeat the steps for each exercise (do more repetitions). If you do not have back pain or discomfort, do these exercises once each day or as told by your health care provider. Exercises Single Knee to Chest  Repeat these steps 3-5 times for each leg: 1. Lie on your back on a firm bed or the floor with your legs extended. 2. Bring one knee to your chest. Your other leg should stay extended and in contact with the floor. 3. Hold your knee in place by grabbing your knee or thigh. 4. Pull on your knee until you feel a gentle stretch in your lower back. 5. Hold the stretch for 10-30 seconds. 6. Slowly release and straighten your leg. Pelvic Tilt  Repeat these steps 5-10 times: 1. Lie on your back on a firm bed or the floor with your legs extended. 2. Bend your knees so they are pointing toward the ceiling and your feet are flat on the floor. 3. Tighten your lower abdominal muscles to press your lower back against the floor. This motion will tilt your pelvis so your tailbone points up toward the ceiling instead  of pointing to your feet or the floor. 4. With gentle tension and even breathing, hold this position for 5-10 seconds. Cat-Cow  Repeat these steps until your lower back becomes more flexible: 1. Get into a hands-and-knees position on a firm surface. Keep your hands under your shoulders, and keep your knees under your hips. You may place padding under your knees for comfort. 2. Let your head hang down, and point your tailbone toward the floor so your lower back becomes rounded like the back of a cat. 3. Hold this position for 5 seconds. 4. Slowly lift your head and point your tailbone up toward the ceiling so your back forms a sagging arch like the back of a cow. 5. Hold this position for 5 seconds. Press-Ups  Repeat these steps 5-10 times: 1. Lie on your abdomen (face-down) on the floor. 2. Place your palms near your head, about shoulder-width apart. 3. While you keep your back as relaxed as possible and keep your hips on the floor, slowly straighten your arms to raise the top half of your body and lift your shoulders. Do not use your back muscles to  raise your upper torso. You may adjust the placement of your hands to make yourself more comfortable. 4. Hold this position for 5 seconds while you keep your back relaxed. 5. Slowly return to lying flat on the floor. Bridges  Repeat these steps 10 times: 1. Lie on your back on a firm surface. 2. Bend your knees so they are pointing toward the ceiling and your feet are flat on the floor. 3. Tighten your buttocks muscles and lift your buttocks off of the floor until your waist is at almost the same height as your knees. You should feel the muscles working in your buttocks and the back of your thighs. If you do not feel these muscles, slide your feet 1-2 inches farther away from your buttocks. 4. Hold this position for 3-5 seconds. 5. Slowly lower your hips to the starting position, and allow your buttocks muscles to relax completely. If this  exercise is too easy, try doing it with your arms crossed over your chest. Abdominal Crunches  Repeat these steps 5-10 times: 1. Lie on your back on a firm bed or the floor with your legs extended. 2. Bend your knees so they are pointing toward the ceiling and your feet are flat on the floor. 3. Cross your arms over your chest. 4. Tip your chin slightly toward your chest without bending your neck. 5. Tighten your abdominal muscles and slowly raise your trunk (torso) high enough to lift your shoulder blades a tiny bit off of the floor. Avoid raising your torso higher than that, because it can put too much stress on your low back and it does not help to strengthen your abdominal muscles. 6. Slowly return to your starting position. Back Lifts  Repeat these steps 5-10 times: 1. Lie on your abdomen (face-down) with your arms at your sides, and rest your forehead on the floor. 2. Tighten the muscles in your legs and your buttocks. 3. Slowly lift your chest off of the floor while you keep your hips pressed to the floor. Keep the back of your head in line with the curve in your back. Your eyes should be looking at the floor. 4. Hold this position for 3-5 seconds. 5. Slowly return to your starting position. Contact a health care provider if:  Your back pain or discomfort gets much worse when you do an exercise.  Your back pain or discomfort does not lessen within 2 hours after you exercise. If you have any of these problems, stop doing these exercises right away. Do not do them again unless your health care provider says that you can. Get help right away if:  You develop sudden, severe back pain. If this happens, stop doing the exercises right away. Do not do them again unless your health care provider says that you can. This information is not intended to replace advice given to you by your health care provider. Make sure you discuss any questions you have with your health care provider. Document  Released: 12/02/2004 Document Revised: 03/03/2016 Document Reviewed: 12/19/2014 Elsevier Interactive Patient Education  2017 [employer].

## 2016-09-22 NOTE — Progress Notes (Signed)
BP 126/72 (BP Location: Left Arm, Patient Position: Sitting, Cuff Size: Normal)   Pulse 67   Temp 97.3 F (36.3 C) (Other (Comment))   Ht 5' 5.5" (1.664 m)   Wt 237 lb 9.6 oz (107.8 kg)   LMP  (LMP Unknown)   SpO2 95%   BMI 38.94 kg/m    Subjective:    Patient ID: Toni [name], female    DOB: 1956-07-20, 60 y.o.   MRN: [id number]  HPI: Toni [name] is a 60 y.o. female presenting on 09/22/2016 for Follow-up   HPI   Colonoscopy report obtained (done at [facility] on - 12/29/09)- she had polyp  She turned in her [employer] discount application  She has Mammogram appointment tomorrow  She has TEE appointment in december  She says the globus sensation hasn't improved much since starting the omeprazole.  She says the cardiologist thinks it is related to her heart.   Relevant past medical, surgical, family and social history reviewed and updated as indicated. Interim medical history since our last visit reviewed. Allergies and medications reviewed and updated.   Current Outpatient Prescriptions:  .  aspirin 325 MG EC tablet, Take 325 mg by mouth daily., Disp: , Rfl:  .  dimenhyDRINATE (DRAMAMINE) 50 MG tablet, Take 50 mg by mouth at bedtime as needed (to help her sleep)., Disp: , Rfl:  .  diphenhydrAMINE (BENADRYL) 25 MG tablet, Take 25 mg by mouth at bedtime as needed for sleep. , Disp: , Rfl:  .  FLUoxetine (PROZAC) 20 MG capsule, Take 1 capsule (20 mg total) by mouth daily., Disp: 90 capsule, Rfl: 1 .  gabapentin (NEURONTIN) 300 MG capsule, TAKE 300MG 1 TAB IN AM AND 600MG 2 TABS PM, Disp: 90 capsule, Rfl: 3 .  ibuprofen (ADVIL,MOTRIN) 200 MG tablet, Take 800 mg by mouth every 8 (eight) hours as needed for mild pain. Alternates with Norco if needed, Disp: , Rfl:  .  lisinopril (PRINIVIL,ZESTRIL) 10 MG tablet, Take 1 tablet (10 mg total) by mouth daily., Disp: 90 tablet, Rfl: 1 .  metoprolol (LOPRESSOR) 100 MG tablet, Take 1 tablet (100 mg total) by mouth 2 (two) times daily., Disp: 180  tablet, Rfl: 1 .  metoprolol tartrate (LOPRESSOR) 25 MG tablet, Take 25 mg by mouth 2 (two) times daily., Disp: , Rfl:  .  omeprazole (PRILOSEC) 40 MG capsule, Take 1 capsule (40 mg total) by mouth daily., Disp: 90 capsule, Rfl: 1 .  potassium chloride SA (K-DUR,KLOR-CON) 20 MEQ tablet, Take 99 mEq by mouth daily. , Disp: , Rfl:  .  torsemide (DEMADEX) 20 MG tablet, Take 20 mg by mouth daily. , Disp: , Rfl:  .  HYDROcodone-acetaminophen (NORCO) 7.5-325 MG tablet, Take 1 tablet by mouth every 6 (six) hours as needed for moderate pain., Disp: , Rfl:  .  magnesium oxide (MAG-OX) 400 MG tablet, Take 400 mg by mouth 2 (two) times daily., Disp: , Rfl:    Review of Systems  Constitutional: Positive for diaphoresis, fatigue and unexpected weight change. Negative for appetite change, chills and fever.  HENT: Positive for dental problem. Negative for congestion, drooling, ear pain, facial swelling, hearing loss, mouth sores, sneezing, sore throat, trouble swallowing and voice change.   Eyes: Negative for pain, discharge, redness, itching and visual disturbance.  Respiratory: Positive for shortness of breath and wheezing. Negative for cough and choking.   Cardiovascular: Positive for chest pain, palpitations and leg swelling.  Gastrointestinal: Negative for abdominal pain, blood in stool, constipation, diarrhea  and vomiting.  Endocrine: Positive for cold intolerance. Negative for heat intolerance and polydipsia.  Genitourinary: Negative for decreased urine volume, dysuria and hematuria.  Musculoskeletal: Negative for arthralgias, back pain and gait problem.  Skin: Negative for rash.  Allergic/Immunologic: Negative for environmental allergies.  Neurological: Negative for seizures, syncope, light-headedness and headaches.  Hematological: Negative for adenopathy.  Psychiatric/Behavioral: Negative for agitation, dysphoric mood and suicidal ideas. The patient is not nervous/anxious.     Per HPI unless  specifically indicated above     Objective:    BP 126/72 (BP Location: Left Arm, Patient Position: Sitting, Cuff Size: Normal)   Pulse 67   Temp 97.3 F (36.3 C) (Other (Comment))   Ht 5' 5.5" (1.664 m)   Wt 237 lb 9.6 oz (107.8 kg)   LMP  (LMP Unknown)   SpO2 95%   BMI 38.94 kg/m   Wt Readings from Last 3 Encounters:  09/22/16 237 lb 9.6 oz (107.8 kg)  09/01/16 238 lb 6.4 oz (108.1 kg)  08/31/16 238 lb (108 kg)    Physical Exam  Constitutional: She is oriented to person, place, and time. She appears well-developed and well-nourished.  HENT:  Head: Normocephalic and atraumatic.  Neck: Neck supple.  Cardiovascular: Normal rate and regular rhythm.   Pulmonary/Chest: Effort normal and breath sounds normal.  Abdominal: Soft. Bowel sounds are normal. She exhibits no mass. There is no hepatosplenomegaly. There is no tenderness.  Musculoskeletal: She exhibits no edema.  Lymphadenopathy:    She has no cervical adenopathy.  Neurological: She is alert and oriented to person, place, and time.  Skin: Skin is warm and dry.  Psychiatric: She has a normal mood and affect. Her behavior is normal.  Vitals reviewed.   Results for orders placed or performed in visit on 08/31/16  Uric acid  Result Value Ref Range   Uric Acid, Serum 7.5 (H) 2.5 - 7.0 mg/dL  Comprehensive metabolic panel  Result Value Ref Range   Sodium 143 135 - 146 mmol/L   Potassium 4.8 3.5 - 5.3 mmol/L   Chloride 108 98 - 110 mmol/L   CO2 26 20 - 31 mmol/L   Glucose, Bld 82 65 - 99 mg/dL   BUN 26 (H) 7 - 25 mg/dL   Creat 1.43 (H) 0.50 - 0.99 mg/dL   Total Bilirubin 0.4 0.2 - 1.2 mg/dL   Alkaline Phosphatase 105 33 - 130 U/L   AST 25 10 - 35 U/L   ALT 23 6 - 29 U/L   Total Protein 6.8 6.1 - 8.1 g/dL   Albumin 3.9 3.6 - 5.1 g/dL   Calcium 9.6 8.6 - 10.4 mg/dL  HgB A1c  Result Value Ref Range   Hgb A1c MFr Bld 5.4 <5.7 %   Mean Plasma Glucose 108 mg/dL  Lipid Profile  Result Value Ref Range   Cholesterol 141  125 - 200 mg/dL   Triglycerides 68 <150 mg/dL   HDL 43 (L) >=46 mg/dL   Total CHOL/HDL Ratio 3.3 <=5.0 Ratio   VLDL 14 <30 mg/dL   LDL Cholesterol 84 <130 mg/dL  CBC w/Diff/Platelet  Result Value Ref Range   WBC 5.2 3.8 - 10.8 K/uL   RBC 3.80 3.80 - 5.10 MIL/uL   Hemoglobin 11.5 (L) 11.7 - 15.5 g/dL   HCT 35.6 35.0 - 45.0 %   MCV 93.7 80.0 - 100.0 fL   MCH 30.3 27.0 - 33.0 pg   MCHC 32.3 32.0 - 36.0 g/dL   RDW 14.7 11.0 -  15.0 %   Platelets 161 140 - 400 K/uL   MPV 10.7 7.5 - 12.5 fL   Neutro Abs 3,588 1,500 - 7,800 cells/uL   Lymphs Abs 1,040 850 - 3,900 cells/uL   Monocytes Absolute 520 200 - 950 cells/uL   Eosinophils Absolute 52 15 - 500 cells/uL   Basophils Absolute 0 0 - 200 cells/uL   Neutrophils Relative % 69 %   Lymphocytes Relative 20 %   Monocytes Relative 10 %   Eosinophils Relative 1 %   Basophils Relative 0 %   Smear Review Criteria for review not met   POCT Glucose (CBG)  Result Value Ref Range   POC Glucose 89 70 - 99 mg/dl      Assessment & Plan:   Encounter Diagnoses  Name Primary?  . Hypertension, unspecified type Yes  . History of colonic polyps   . Chronic kidney disease, unspecified CKD stage   . Chronic low back pain, unspecified back pain laterality, with sciatica presence unspecified   . Gout, unspecified cause, unspecified chronicity, unspecified site   . Valvular heart disease     -reviewed labs with pt -Needs repeat colonoscopy- refer to GI (pt has already turned in her [employer] discount application) -encouraged Low purine diet and gave handout -rx indocin to use prn gout pain -pt has mammogram appointment tomorrow -encouraged gentle back exercises to help with chronic back pain. Gave handout -F/u 54month With recheck creatinine at that time. It not improved, will discuss referral to nephrology  (The duration of this appointment visit was 25 minutes of face-to-face time with the patient.  Greater than 50% of this time was spent in  counseling, explanation of diagnosis, planning of further management, and coordination of care.)

## 2016-09-23 ENCOUNTER — Ambulatory Visit (HOSPITAL_COMMUNITY)
Admission: RE | Admit: 2016-09-23 | Discharge: 2016-09-23 | Disposition: A | Discharge status: Home / Self Care | Payer: Self-pay | Source: Ambulatory Visit | Attending: Physician Assistant | Admitting: Physician Assistant

## 2016-09-28 ENCOUNTER — Encounter: Payer: Self-pay | Admitting: Gastroenterology

## 2016-10-07 ENCOUNTER — Other Ambulatory Visit: Payer: Self-pay | Admitting: *Deleted

## 2016-10-07 MED ORDER — METOPROLOL TARTRATE 25 MG PO TABS: 25.0000 mg | ORAL_TABLET | Freq: Two times a day (BID) | ORAL | 3 refills | Status: DC

## 2016-10-08 ENCOUNTER — Encounter: Payer: Self-pay | Admitting: Physician Assistant

## 2016-10-13 ENCOUNTER — Ambulatory Visit: Payer: Self-pay | Admitting: Cardiology

## 2016-10-18 ENCOUNTER — Ambulatory Visit (HOSPITAL_BASED_OUTPATIENT_CLINIC_OR_DEPARTMENT_OTHER): Payer: Self-pay

## 2016-10-18 ENCOUNTER — Encounter (HOSPITAL_COMMUNITY): Admission: RE | Payer: Self-pay | Source: Ambulatory Visit

## 2016-10-18 ENCOUNTER — Ambulatory Visit (HOSPITAL_COMMUNITY)
Admission: RE | Admit: 2016-10-18 | Discharge: 2016-10-18 | Disposition: A | Discharge status: Home / Self Care | Payer: Self-pay | Source: Ambulatory Visit | Attending: Cardiology | Admitting: Cardiology

## 2016-10-18 ENCOUNTER — Ambulatory Visit (HOSPITAL_COMMUNITY): Admission: RE | Admit: 2016-10-18 | Payer: Self-pay | Source: Ambulatory Visit | Admitting: Cardiology

## 2016-10-18 ENCOUNTER — Other Ambulatory Visit: Payer: Self-pay | Admitting: *Deleted

## 2016-10-18 ENCOUNTER — Encounter (HOSPITAL_COMMUNITY): Payer: Self-pay | Admitting: *Deleted

## 2016-10-18 ENCOUNTER — Encounter (HOSPITAL_COMMUNITY)
Admission: RE | Disposition: A | Discharge status: Home / Self Care | Payer: Self-pay | Source: Ambulatory Visit | Attending: Cardiology

## 2016-10-18 ENCOUNTER — Ambulatory Visit (HOSPITAL_COMMUNITY): Payer: Self-pay

## 2016-10-18 DIAGNOSIS — I081 Rheumatic disorders of both mitral and tricuspid valves: Secondary | ICD-10-CM | POA: Insufficient documentation

## 2016-10-18 DIAGNOSIS — Z96651 Presence of right artificial knee joint: Secondary | ICD-10-CM | POA: Insufficient documentation

## 2016-10-18 DIAGNOSIS — M792 Neuralgia and neuritis, unspecified: Secondary | ICD-10-CM | POA: Insufficient documentation

## 2016-10-18 DIAGNOSIS — Z87891 Personal history of nicotine dependence: Secondary | ICD-10-CM | POA: Insufficient documentation

## 2016-10-18 DIAGNOSIS — I110 Hypertensive heart disease with heart failure: Secondary | ICD-10-CM | POA: Insufficient documentation

## 2016-10-18 DIAGNOSIS — I700 Atherosclerosis of aorta: Secondary | ICD-10-CM | POA: Insufficient documentation

## 2016-10-18 DIAGNOSIS — I38 Endocarditis, valve unspecified: Secondary | ICD-10-CM

## 2016-10-18 DIAGNOSIS — I480 Paroxysmal atrial fibrillation: Secondary | ICD-10-CM | POA: Insufficient documentation

## 2016-10-18 DIAGNOSIS — F329 Major depressive disorder, single episode, unspecified: Secondary | ICD-10-CM | POA: Insufficient documentation

## 2016-10-18 DIAGNOSIS — Z6837 Body mass index (BMI) 37.0-37.9, adult: Secondary | ICD-10-CM | POA: Insufficient documentation

## 2016-10-18 DIAGNOSIS — G4700 Insomnia, unspecified: Secondary | ICD-10-CM | POA: Insufficient documentation

## 2016-10-18 DIAGNOSIS — I5032 Chronic diastolic (congestive) heart failure: Secondary | ICD-10-CM | POA: Insufficient documentation

## 2016-10-18 DIAGNOSIS — K219 Gastro-esophageal reflux disease without esophagitis: Secondary | ICD-10-CM | POA: Insufficient documentation

## 2016-10-18 DIAGNOSIS — Z7982 Long term (current) use of aspirin: Secondary | ICD-10-CM | POA: Insufficient documentation

## 2016-10-18 DIAGNOSIS — I340 Nonrheumatic mitral (valve) insufficiency: Secondary | ICD-10-CM

## 2016-10-18 DIAGNOSIS — Z79899 Other long term (current) drug therapy: Secondary | ICD-10-CM | POA: Insufficient documentation

## 2016-10-18 HISTORY — PX: TEE WITHOUT CARDIOVERSION: SHX5443

## 2016-10-18 SURGERY — ECHOCARDIOGRAM, TRANSESOPHAGEAL
Anesthesia: Moderate Sedation

## 2016-10-18 MED ORDER — SODIUM CHLORIDE 0.9 % IV SOLN
INTRAVENOUS | Status: DC
  Administered 2016-10-18: 10:00:00 via INTRAVENOUS

## 2016-10-18 MED ORDER — LIDOCAINE VISCOUS 2 % MT SOLN
OROMUCOSAL | Status: DC | PRN
  Administered 2016-10-18 (×2): 6 mL via OROMUCOSAL

## 2016-10-18 MED ORDER — MIDAZOLAM HCL 5 MG/5ML IJ SOLN
INTRAMUSCULAR | Status: DC | PRN
  Administered 2016-10-18: 0.5 mg via INTRAVENOUS
  Administered 2016-10-18: 1 mg via INTRAVENOUS
  Administered 2016-10-18 (×2): 0.5 mg via INTRAVENOUS

## 2016-10-18 MED ORDER — MIDAZOLAM HCL 5 MG/5ML IJ SOLN
INTRAMUSCULAR | Status: AC
  Filled 2016-10-18: qty 5

## 2016-10-18 MED ORDER — BUTAMBEN-TETRACAINE-BENZOCAINE 2-2-14 % EX AERO
INHALATION_SPRAY | CUTANEOUS | Status: DC | PRN
  Administered 2016-10-18: 2 via TOPICAL

## 2016-10-18 MED ORDER — FENTANYL CITRATE (PF) 100 MCG/2ML IJ SOLN
INTRAMUSCULAR | Status: AC
  Filled 2016-10-18: qty 2

## 2016-10-18 MED ORDER — FENTANYL CITRATE (PF) 100 MCG/2ML IJ SOLN
INTRAMUSCULAR | Status: DC | PRN
  Administered 2016-10-18 (×2): 25 ug via INTRAVENOUS
  Administered 2016-10-18: 50 ug via INTRAVENOUS

## 2016-10-18 MED ORDER — LIDOCAINE VISCOUS 2 % MT SOLN
OROMUCOSAL | Status: AC
  Filled 2016-10-18: qty 15

## 2016-10-18 NOTE — H&P (Signed)
Please see referenced recent clinic note below for full history. History of MV valve repair, recent SOB. TTE showed increased gradient across MV, TEE to further evaluate.   Clinical Summary Ms. Urness is a 60 y.o.female seen today for follow up of the following medical problems.  1. Mitral regurgitation - history of previous MV repair at [facility] 02/2015 - Radical Repair with resection of P2 Prolapse, 26 mm Physio II Ring Annuloplasty, Tricuspid Valve Repair (26 mm Ring Annuloplasty, Cox Maze IV Procedure on 02/27/2015 - cath prior to surgery showed no significant CAD - post repair echo 02/2015 showed no MR, mild TR. There are no reported gradients across the valves in the report - has had some recent SOB. Increased DOE over the last few months, at 1/2 block. Subjective abdominal distension. +orthopnea x 3 pillows which is chronic. Reports some weight gain.  - taking torsemide 20mg  daily, but will take additional 20 if needed.   - since last visit still with some SOB/DOE, overall unchanged.  - echo showed elevated gradients across her repaired MV and TV consistent with post repair stenosis.    2. Afib - reports recent palpitations, typically with activity but can occur at rest. Lasts just a few minutes. Occurs 1-2 times a week - can have sudden dizziness at times.  - has not been on anticoag.  - 06/2016 event monitor without evidence of afib, occasional PVCs.  - since last visit still with some palpitations at times       Past Medical History:  Diagnosis Date  . Congestive heart failure, unspecified    Diastolic heart failure  . Degeneration of lumbar or lumbosacral intervertebral disc   . Depression   . Dyspnea     chronic multifactorial large component of deconditioning  . Esophageal reflux   . Insomnia    Working third shift  . Mitral valve disorders(424.0)    Mild to moderate mitral regurgitation  . Mitral valve insufficiency and aortic valve  insufficiency   . Morbid obesity ([place])   . Normocytic anemia   . Other chest pain   . Other dyspnea and respiratory abnormality   . Paroxysmal atrial fibrillation ([name])    Stable no recurrence  . Peripheral edema    chronic  . Unspecified essential hypertension          Allergies  Allergen Reactions  . Iodinated Diagnostic Agents            Current Outpatient Prescriptions  Medication Sig Dispense Refill  . aspirin 325 MG EC tablet Take 325 mg by mouth daily.    Marland Kitchen dimenhyDRINATE (DRAMAMINE) 50 MG tablet Take 50 mg by mouth at bedtime as needed (to help her sleep).    . diphenhydrAMINE (BENADRYL) 25 MG tablet Take 25 mg by mouth at bedtime as needed for sleep.     Marland Kitchen FLUoxetine (PROZAC) 20 MG capsule Take 1 capsule (20 mg total) by mouth daily. 90 capsule 1  . gabapentin (NEURONTIN) 300 MG capsule TAKE 1 TAB ON 1ST DAY THEN TAKE 1 TAB TWICE DAILY 60 capsule 3  . HYDROcodone-acetaminophen (NORCO) 7.5-325 MG tablet Take 1 tablet by mouth every 6 (six) hours as needed for moderate pain.    Marland Kitchen ibuprofen (ADVIL,MOTRIN) 200 MG tablet Take 800 mg by mouth every 8 (eight) hours as needed for mild pain. Alternates with Norco if needed    . lisinopril (PRINIVIL,ZESTRIL) 10 MG tablet Take 1 tablet (10 mg total) by mouth daily. 90 tablet 1  .  magnesium oxide (MAG-OX) 400 MG tablet Take 400 mg by mouth 2 (two) times daily.    . metoprolol (LOPRESSOR) 100 MG tablet Take 1 tablet (100 mg total) by mouth 2 (two) times daily. 180 tablet 1  . metoprolol tartrate (LOPRESSOR) 25 MG tablet Take 1 tablet (25 mg total) by mouth daily. 90 tablet 3  . omeprazole (PRILOSEC) 40 MG capsule Take 1 capsule (40 mg total) by mouth daily. 90 capsule 1  . potassium chloride SA (K-DUR,KLOR-CON) 20 MEQ tablet Take 99 mEq by mouth daily.     . ranitidine (ZANTAC) 300 MG capsule Take 300 mg by mouth every evening.    Marland Kitchen spironolactone (ALDACTONE) 25 MG tablet Take 25 mg by mouth daily.     Marland Kitchen torsemide (DEMADEX) 20 MG tablet Take 20 mg by mouth daily.      No current facility-administered medications for this visit.           Past Surgical History:  Procedure Laterality Date  . APPENDECTOMY    . BREAST BIOPSY Left 2009  . CARDIAC VALVE SURGERY  02/27/2015  . CHOLECYSTECTOMY    . HEEL SPUR SURGERY Right   . TOTAL ABDOMINAL HYSTERECTOMY    . TOTAL KNEE ARTHROPLASTY Right   . TUBAL LIGATION           Allergies  Allergen Reactions  . Iodinated Diagnostic Agents             Family History  Problem Relation Age of Onset  . Hypertension Mother   . Heart disease Mother   . Kidney disease Father   . Stroke Father   . Diabetes Sister   . Kidney disease Sister   . Heart disease Sister   . Hypertension Sister   . Heart disease Brother   . Heart attack Son   . Hypertension Son   . Cancer Paternal Grandmother     Breast Cancer  . Heart disease Brother      Social History Ms. Greenland reports that she quit smoking about 20 years ago. Her smoking use included Cigarettes. She has a 12.50 pack-year smoking history. She quit smokeless tobacco use about 42 years ago. Her smokeless tobacco use included Chew. Ms. Clendenen reports that she does not drink alcohol.   Review of Systems CONSTITUTIONAL: No weight loss, fever, chills, weakness or fatigue.  HEENT: Eyes: No visual loss, blurred vision, double vision or yellow sclerae.No hearing loss, sneezing, congestion, runny nose or sore throat.  SKIN: No rash or itching.  CARDIOVASCULAR: per HPI RESPIRATORY: No shortness of breath, cough or sputum.  GASTROINTESTINAL: No anorexia, nausea, vomiting or diarrhea. No abdominal pain or blood.  GENITOURINARY: No burning on urination, no polyuria NEUROLOGICAL: No headache, dizziness, syncope, paralysis, ataxia, numbness or tingling in the extremities. No change in bowel or bladder control.  MUSCULOSKELETAL: No muscle, back pain, joint  pain or stiffness.  LYMPHATICS: No enlarged nodes. No history of splenectomy.  PSYCHIATRIC: No history of depression or anxiety.  ENDOCRINOLOGIC: No reports of sweating, cold or heat intolerance. No polyuria or polydipsia.  Marland Kitchen   Physical Examination    Vitals:   09/01/16 1545  BP: (!) 160/95  Pulse: 60      Vitals:   09/01/16 1545  Weight: 238 lb 6.4 oz (108.1 kg)  Height: 5\' 8"  (1.727 m)    Gen: resting comfortably, no acute distress HEENT: no scleral icterus, pupils equal round and reactive, no palptable cervical adenopathy,  CV: RRR, no m/r/g, no jvd Resp:  Clear to auscultation bilaterally GI: abdomen is soft, non-tender, non-distended, normal bowel sounds, no hepatosplenomegaly MSK: extremities are warm, no edema.  Skin: warm, no rash Neuro:  no focal deficits Psych: appropriate affect      Assessment and Plan  1. Valvular heart disease - previous MV and TV repair - with ongoing SOB/DOE. - recent TTE shows elevated gradients across both valves, cannot distinnguish anatomy by TTE - pursue TEE once she established with [employer] assistance program   2. Afib - from previous notes looks like there were discussions about anticoag however patient was never committed to it.  - CHADS2Vasc score is 2, anticoag would be indicated.  - she is s/p Maze procedure, reports recent palpitations. - monitor did not show any recurrence of afib - continue to monitor  3. Sternal pain - neuropathic pain at site of sternotomy - improving but not resolved on gabapentin.Increase to 300mg  in AM and 600mg  pm. No increase in daytime dose as it makes her sleepy.        Alphonse Guild     Electronically signed by Arnoldo Lenis, MD at 09/02/2016 10:16 PM

## 2016-10-18 NOTE — Progress Notes (Signed)
*  PRELIMINARY RESULTS* Echocardiogram Echocardiogram Transesophageal has been performed.  Samuel Germany 10/18/2016, 11:18 AM

## 2016-10-18 NOTE — Discharge Instructions (Signed)
Upper Endoscopy, Care After  Refer to this sheet in the next few weeks. These instructions provide you with information about caring for yourself after your procedure. Your health care provider may also give you more specific instructions. Your treatment has been planned according to current medical practices, but problems sometimes occur. Call your health care provider if you have any problems or questions after your procedure.  What can I expect after the procedure?  After the procedure, it is common to have:  · A sore throat.  · Bloating.  · Nausea.    Follow these instructions at home:  · Follow instructions from your health care provider about what to eat or drink after your procedure.  · Return to your normal activities as told by your health care provider. Ask your health care provider what activities are safe for you.  · Take over-the-counter and prescription medicines only as told by your health care provider.  · Do not drive for 24 hours if you received a sedative.  · Keep all follow-up visits as told by your health care provider. This is important.  Contact a health care provider if:  · You have a sore throat that lasts longer than one day.  · You have trouble swallowing.  Get help right away if:  · You have a fever.  · You vomit blood or your vomit looks like coffee grounds.  · You have bloody, black, or tarry stools.  · You have a severe sore throat or you cannot swallow.  · You have difficulty breathing.  · You have severe pain in your chest or belly.  This information is not intended to replace advice given to you by your health care provider. Make sure you discuss any questions you have with your health care provider.  Document Released: 04/25/2012 Document Revised: 04/01/2016 Document Reviewed: 08/07/2015  Elsevier Interactive Patient Education © 2017 [employer]

## 2016-10-18 NOTE — CV Procedure (Signed)
Procedure: TEE Physician: Dr Carlyle Dolly MD Indication: Mitral stenosis   The patient was brought to the procedure suite after appropraite consent was obtained. The posterior oropharynx was anesthesized with 2% viscous lidocaine and cetacaine spray. A bite block was placed. Moderate sedation was achieved with a total of 2.5 mg of versed and 165mcg of fentanyl. Cardiopulmonary monitoring was performed throughout the procedure. The TEE probe was intubated into the esophagus without difficult, several images were obtained in the midepigastric, transgastric, and deep transgastric positions. The patient tolerated the procedure without compiclation.  Please see TEE report for full findings. Preliminary review shows lest moderate gradient across her previously repaired MV.    Zandra Abts MD

## 2016-10-19 ENCOUNTER — Telehealth: Payer: Self-pay

## 2016-10-19 NOTE — Telephone Encounter (Signed)
-----   Message from Arnoldo Lenis, MD sent at 10/19/2016  3:16 PM EST ----- Please let patient know that her TEE showed that her repaired mitral valve has moderately increased pressure across it, this is caused by the fact the repair led to some narrowing of the opening. Its something for now we will continue to monitor and try to treat with medicines, ultimately if symptoms continue we may need to consider a valve replacement at some point in the future. We will discuss at our f/u next month  Zandra Abts MD

## 2016-10-19 NOTE — Telephone Encounter (Signed)
Pt made aware, copy to pcp.  

## 2016-10-20 ENCOUNTER — Ambulatory Visit (INDEPENDENT_AMBULATORY_CARE_PROVIDER_SITE_OTHER): Payer: Self-pay | Admitting: Gastroenterology

## 2016-10-20 ENCOUNTER — Other Ambulatory Visit: Payer: Self-pay

## 2016-10-20 ENCOUNTER — Telehealth: Payer: Self-pay

## 2016-10-20 ENCOUNTER — Encounter: Payer: Self-pay | Admitting: Gastroenterology

## 2016-10-20 ENCOUNTER — Telehealth: Payer: Self-pay | Admitting: Gastroenterology

## 2016-10-20 VITALS — BP 111/66 | HR 73 | Temp 98.3°F | Ht 68.0 in | Wt 238.4 lb

## 2016-10-20 DIAGNOSIS — D369 Benign neoplasm, unspecified site: Secondary | ICD-10-CM

## 2016-10-20 DIAGNOSIS — K5900 Constipation, unspecified: Secondary | ICD-10-CM | POA: Insufficient documentation

## 2016-10-20 DIAGNOSIS — R1310 Dysphagia, unspecified: Secondary | ICD-10-CM

## 2016-10-20 DIAGNOSIS — R1319 Other dysphagia: Secondary | ICD-10-CM

## 2016-10-20 NOTE — Telephone Encounter (Signed)
Dr. Harl Bowie,   This mutual patient was seen in our office today by Roseanne Kaufman, NP.  She would like to know if pt can get a cardiac clearance prior to scheduling an EGD and possibly a colonoscopy.  Thanks so much for your help!

## 2016-10-20 NOTE — Telephone Encounter (Signed)
Received pathology report from colonoscopy in 2011. Tubular adenoma.    Please schedule TCS/EGD/dilation with Dr. Gala Romney, with Phenergan 12.5 mg IV on call.    I have also requested a cardiology verbal clearance due to recent need for TEE. Looks like it is being medically managed and will likely be ok to proceed.

## 2016-10-20 NOTE — Patient Instructions (Signed)
I would like to try you on Linzess 72 mcg for constipation. Take 1 capsule each morning, 30 minutes before breakfast. You may have some loose stool starting out, but it should get better after a few days to a week. If not, let me know. We can change the dosage if needed. If you like this, let us know, so that we may give you a prescription and get patient assistance.  Please complete the stool test to check for blood in your stool and return to our office. If it is positive OR you have a history or adenomas on last colonoscopy (I'm getting the pathology report), then we will do a colonoscopy.  You definitely need an upper endoscopy with dilation in the future. You may or may not need a colonoscopy. Will review the stool test and pathology first.

## 2016-10-20 NOTE — Progress Notes (Signed)
CC'ED TO PCP 

## 2016-10-20 NOTE — Telephone Encounter (Signed)
[facility] [place] for EGD and colonoscopy if needed   Zandra Abts MD

## 2016-10-20 NOTE — Assessment & Plan Note (Addendum)
60 year old female referred for history of polyps; however, we do not have pathology reports available at time of visit. If hyperplastic polyps, no need for surveillance now. If adenomas, will pursue colonoscopy as it was done in 2011. She has no concerning lower GI symptoms and deals with chronic constipation. Will obtain ifobt, start Linzess 72 mcg samples, and decide on colonoscopy after pathology report and review of ifobt.    ADDENDUM: Received pathology report shortly after patient left. Tubular adenoma on pathology. Will proceed with TCS/EGD/dilation with Dr. Gala Romney with 12.5 mg IV on call. Risks, benefits, alternatives discussed in detail with patient at time of appointment.  Will also obtain cardiac clearance as she has a history of valvular repair and recent TEE with repaired mitral valve with moderately increased pressure due to narrowing of the opening, medically managed now. May ultimately need repair in the future. (clearance received 10/20/16 and documented in epic).

## 2016-10-20 NOTE — Telephone Encounter (Signed)
Called pt and scheduled TCS/EGD/DIL with RMR for 11/10/16 at 2:00pm. Pt to come by office to pick-up prep sample and instructions.

## 2016-10-20 NOTE — Assessment & Plan Note (Signed)
Chronic solid food/liquid dysphagia, globus sensation. Takes Prilosec 40 mg daily. Last EGD in 2005 by Dr. Gala Romney with normal esophagus s/p [facility] dilation. She felt much improved after this. I have discussed EGD with dilation in the near future. We will see if a colonoscopy needs to be added to this as well, and then pursue as appropriate in the near future.

## 2016-10-20 NOTE — Telephone Encounter (Signed)
Noted. Procedure scheduled.

## 2016-10-20 NOTE — Progress Notes (Signed)
Primary Care Physician:  Soyla Dryer, PA-C Primary Gastroenterologist:  Dr. Gala Romney   Chief Complaint  Patient presents with  . Colonoscopy    HPI:   Toni [name] is a 60 y.o. female presenting today at the request of Soyla Dryer, PA-C, for surveillance colonoscopy due to history of polyps. She had a colonoscopy in 2011 by Dr. Laural Golden at [facility] with 3 mm polyp from hepatic flexure. Unknown path at this time.   Notes intermittent lower abdominal cramping, feels like she needs to have a BM. States she has IBS. Sometimes constipated, sometimes goes to diarrhea related to food choices. Pain improved after BM. Feels like she is normally more constipated than loose stool. Will go every day for about 3 days then skip days, all while not feeling productive. Stool is hard. No rectal bleeding. No unexplained weight loss or lack of appetite.   Recently had a TEE that showed repaired mitral valve with moderately increased pressure due to narrowing of the opening; consideration for valve replacement raised but for now treating medically. Followed by Dr. Harl Bowie.   Notes globus sensation; feels like something is hung in her throat all the time. Will also try to take medications and it feels like it gets hung up, having to drink a lot of water to get it to go down. Solid food hangs up in esophagus as well. Taking Prilosec once daily for GERD. Burning in lower chest with some foods. States she has had her esophagus stretched twice in the remote past. Review of records show Dr. Leanora Ivanoff completed dilation in 2004, then Dr. Gala Romney in 2005 pursued an EGD with normal esophagus s/p dilation. Small hiatal hernia noted. Felt improved after dilation.     Past Medical History:  Diagnosis Date  . Congestive heart failure, unspecified    Diastolic heart failure  . Degeneration of lumbar or lumbosacral intervertebral disc   . Depression   . Dyspnea     chronic multifactorial large component of  deconditioning  . Esophageal reflux   . Insomnia    Working third shift  . Mitral valve disorders(424.0)    Mild to moderate mitral regurgitation  . Mitral valve insufficiency and aortic valve insufficiency   . Morbid obesity ([place])   . Normocytic anemia   . Other chest pain   . Other dyspnea and respiratory abnormality   . Paroxysmal atrial fibrillation ([name])    Stable no recurrence  . Peripheral edema    chronic  . Unspecified essential hypertension     Past Surgical History:  Procedure Laterality Date  . APPENDECTOMY    . BREAST BIOPSY Left 2009  . CARDIAC VALVE SURGERY  02/27/2015   Mikel Cella. Mitral Valve Repair. Radical Repair with resection of P2 Prolapse, 26 mm Physio II Ring Annuloplasty, Tricuspid Valve Repair (26 mm Ring Annuloplasty, Cox Maze IV Procedure on 02/27/2015  . CHOLECYSTECTOMY    . COLONOSCOPY  2011   Dr. Laural Golden: 79mm polyp from hepatic flexure (path unknown)   . ESOPHAGOGASTRODUODENOSCOPY  2005    Dr. Gala Romney : normal esophagus s/p dilation. Small hiatal hernia noted. Felt improved after dilation.   Marland Kitchen HEEL SPUR SURGERY Right   . TOTAL ABDOMINAL HYSTERECTOMY    . TOTAL KNEE ARTHROPLASTY Right   . TUBAL LIGATION      Current Outpatient Prescriptions  Medication Sig Dispense Refill  . aspirin 325 MG EC tablet Take 325 mg by mouth daily.    Marland Kitchen dimenhyDRINATE (DRAMAMINE) 50 MG tablet Take 50  mg by mouth at bedtime as needed (to help her sleep).    Marland Kitchen FLUoxetine (PROZAC) 20 MG capsule Take 1 capsule (20 mg total) by mouth daily. 90 capsule 1  . gabapentin (NEURONTIN) 300 MG capsule TAKE 300MG  1 TAB IN AM AND 600MG  2 TABS PM 90 capsule 3  . ibuprofen (ADVIL,MOTRIN) 200 MG tablet Take 800 mg by mouth every 8 (eight) hours as needed for mild pain. Alternates with Norco if needed    . indomethacin (INDOCIN) 25 MG capsule 1 po q 8 hour prn gout pain 60 capsule 1  . lisinopril (PRINIVIL,ZESTRIL) 10 MG tablet Take 1 tablet (10 mg total) by mouth daily. 90 tablet 1  .  magnesium oxide (MAG-OX) 400 MG tablet Take 400 mg by mouth 2 (two) times daily.    . metoprolol (LOPRESSOR) 100 MG tablet Take 1 tablet (100 mg total) by mouth 2 (two) times daily. 180 tablet 1  . metoprolol tartrate (LOPRESSOR) 25 MG tablet Take 1 tablet (25 mg total) by mouth 2 (two) times daily. 180 tablet 3  . omeprazole (PRILOSEC) 40 MG capsule Take 1 capsule (40 mg total) by mouth daily. 90 capsule 1  . potassium chloride SA (K-DUR,KLOR-CON) 20 MEQ tablet Take 99 mEq by mouth daily.     Marland Kitchen torsemide (DEMADEX) 20 MG tablet Take 20 mg by mouth daily.     . diphenhydrAMINE (BENADRYL) 25 MG tablet Take 25 mg by mouth at bedtime as needed for sleep.     Marland Kitchen HYDROcodone-acetaminophen (NORCO) 7.5-325 MG tablet Take 1 tablet by mouth every 6 (six) hours as needed for moderate pain.     No current facility-administered medications for this visit.     Allergies as of 10/20/2016 - Review Complete 10/20/2016  Allergen Reaction Noted  . Iodinated diagnostic agents  09/01/2011    Family History  Problem Relation Age of Onset  . Hypertension Mother   . Heart disease Mother   . Kidney disease Father   . Stroke Father   . Diabetes Sister   . Kidney disease Sister   . Heart disease Sister   . Hypertension Sister   . Heart disease Brother   . Heart attack Son   . Hypertension Son   . Cancer Paternal Grandmother     Breast Cancer  . Heart disease Brother   . Colon cancer Neg Hx     Social History   Social History  . Marital status: Single    Spouse name: N/A  . Number of children: N/A  . Years of education: N/A   Occupational History  . Not on file.   Social History Main Topics  . Smoking status: Former Smoker    Packs/day: 0.50    Years: 25.00    Types: Cigarettes    Quit date: 1997  . Smokeless tobacco: Former Systems developer    Types: [place] date: 1975     Comment: dipped tobacco during pregancy in 1975  . Alcohol use No  . Drug use: No  . Sexual activity: Yes    Partners:  Male   Other Topics Concern  . Not on file   Social History Narrative  . No narrative on file    Review of Systems: Gen: Denies any fever, chills, fatigue, weight loss, lack of appetite.  CV: chest tightness, DOE, evaluated by cardiology  GI: see HPI  GU : Denies urinary burning, urinary frequency, urinary hesitancy MS: +joint pain  Derm: Denies rash, itching, dry  skin Psych: Denies depression, anxiety, memory loss, and confusion Heme: see HPI   Physical Exam: BP 111/66   Pulse 73   Temp 98.3 F (36.8 C) (Oral)   Ht 5\' 8"  (1.727 m)   Wt 238 lb 6.4 oz (108.1 kg)   LMP  (LMP Unknown)   BMI 36.25 kg/m  General:   Alert and oriented. Pleasant and cooperative. Well-nourished and well-developed.  Head:  Normocephalic and atraumatic. Eyes:  Without icterus, sclera clear and conjunctiva pink.  Ears:  Normal auditory acuity. Nose:  No deformity, discharge,  or lesions. Lungs:  Clear to auscultation bilaterally. No wheezes, rales, or rhonchi. No distress.  Heart:  S1, S2 present without murmur  Abdomen:  +BS, soft, non-tender and non-distended. No HSM noted. No guarding or rebound. No masses appreciated.  Rectal:  Deferred  Msk:  Symmetrical without gross deformities. Normal posture. Extremities:  Without  edema. Neurologic:  Alert and  oriented x4 Psych:  Alert and cooperative. Normal mood and affect.

## 2016-10-21 ENCOUNTER — Encounter (HOSPITAL_COMMUNITY): Payer: Self-pay | Admitting: Cardiology

## 2016-10-25 ENCOUNTER — Ambulatory Visit (INDEPENDENT_AMBULATORY_CARE_PROVIDER_SITE_OTHER): Payer: Self-pay

## 2016-10-25 DIAGNOSIS — K5900 Constipation, unspecified: Secondary | ICD-10-CM

## 2016-10-25 LAB — IFOBT (OCCULT BLOOD): IFOBT: NEGATIVE

## 2016-10-29 NOTE — Progress Notes (Signed)
ifobt negative. Pursue colonoscopy due to history of adenomas.

## 2016-11-10 ENCOUNTER — Ambulatory Visit (HOSPITAL_COMMUNITY)
Admission: RE | Admit: 2016-11-10 | Discharge: 2016-11-10 | Disposition: A | Discharge status: Home / Self Care | Payer: Self-pay | Source: Ambulatory Visit | Attending: Internal Medicine | Admitting: Internal Medicine

## 2016-11-10 ENCOUNTER — Encounter (HOSPITAL_COMMUNITY)
Admission: RE | Disposition: A | Discharge status: Home / Self Care | Payer: Self-pay | Source: Ambulatory Visit | Attending: Internal Medicine

## 2016-11-10 ENCOUNTER — Encounter (HOSPITAL_COMMUNITY): Payer: Self-pay

## 2016-11-10 DIAGNOSIS — K449 Diaphragmatic hernia without obstruction or gangrene: Secondary | ICD-10-CM | POA: Insufficient documentation

## 2016-11-10 DIAGNOSIS — D369 Benign neoplasm, unspecified site: Secondary | ICD-10-CM

## 2016-11-10 DIAGNOSIS — Z791 Long term (current) use of non-steroidal anti-inflammatories (NSAID): Secondary | ICD-10-CM | POA: Insufficient documentation

## 2016-11-10 DIAGNOSIS — Z841 Family history of disorders of kidney and ureter: Secondary | ICD-10-CM | POA: Insufficient documentation

## 2016-11-10 DIAGNOSIS — Z79899 Other long term (current) drug therapy: Secondary | ICD-10-CM | POA: Insufficient documentation

## 2016-11-10 DIAGNOSIS — K219 Gastro-esophageal reflux disease without esophagitis: Secondary | ICD-10-CM | POA: Insufficient documentation

## 2016-11-10 DIAGNOSIS — Z86010 Personal history of colonic polyps: Secondary | ICD-10-CM

## 2016-11-10 DIAGNOSIS — Z79891 Long term (current) use of opiate analgesic: Secondary | ICD-10-CM | POA: Insufficient documentation

## 2016-11-10 DIAGNOSIS — M5137 Other intervertebral disc degeneration, lumbosacral region: Secondary | ICD-10-CM | POA: Insufficient documentation

## 2016-11-10 DIAGNOSIS — K589 Irritable bowel syndrome without diarrhea: Secondary | ICD-10-CM | POA: Insufficient documentation

## 2016-11-10 DIAGNOSIS — I110 Hypertensive heart disease with heart failure: Secondary | ICD-10-CM | POA: Insufficient documentation

## 2016-11-10 DIAGNOSIS — Z1211 Encounter for screening for malignant neoplasm of colon: Secondary | ICD-10-CM | POA: Insufficient documentation

## 2016-11-10 DIAGNOSIS — G4700 Insomnia, unspecified: Secondary | ICD-10-CM | POA: Insufficient documentation

## 2016-11-10 DIAGNOSIS — Z87891 Personal history of nicotine dependence: Secondary | ICD-10-CM | POA: Insufficient documentation

## 2016-11-10 DIAGNOSIS — Z823 Family history of stroke: Secondary | ICD-10-CM | POA: Insufficient documentation

## 2016-11-10 DIAGNOSIS — Z91041 Radiographic dye allergy status: Secondary | ICD-10-CM | POA: Insufficient documentation

## 2016-11-10 DIAGNOSIS — I480 Paroxysmal atrial fibrillation: Secondary | ICD-10-CM | POA: Insufficient documentation

## 2016-11-10 DIAGNOSIS — Z9889 Other specified postprocedural states: Secondary | ICD-10-CM | POA: Insufficient documentation

## 2016-11-10 DIAGNOSIS — R1319 Other dysphagia: Secondary | ICD-10-CM

## 2016-11-10 DIAGNOSIS — I080 Rheumatic disorders of both mitral and aortic valves: Secondary | ICD-10-CM | POA: Insufficient documentation

## 2016-11-10 DIAGNOSIS — K5730 Diverticulosis of large intestine without perforation or abscess without bleeding: Secondary | ICD-10-CM

## 2016-11-10 DIAGNOSIS — R1310 Dysphagia, unspecified: Secondary | ICD-10-CM

## 2016-11-10 DIAGNOSIS — Z9049 Acquired absence of other specified parts of digestive tract: Secondary | ICD-10-CM | POA: Insufficient documentation

## 2016-11-10 DIAGNOSIS — M5136 Other intervertebral disc degeneration, lumbar region: Secondary | ICD-10-CM | POA: Insufficient documentation

## 2016-11-10 DIAGNOSIS — R609 Edema, unspecified: Secondary | ICD-10-CM | POA: Insufficient documentation

## 2016-11-10 DIAGNOSIS — F329 Major depressive disorder, single episode, unspecified: Secondary | ICD-10-CM | POA: Insufficient documentation

## 2016-11-10 DIAGNOSIS — Z8249 Family history of ischemic heart disease and other diseases of the circulatory system: Secondary | ICD-10-CM | POA: Insufficient documentation

## 2016-11-10 DIAGNOSIS — Z7982 Long term (current) use of aspirin: Secondary | ICD-10-CM | POA: Insufficient documentation

## 2016-11-10 DIAGNOSIS — I5030 Unspecified diastolic (congestive) heart failure: Secondary | ICD-10-CM | POA: Insufficient documentation

## 2016-11-10 DIAGNOSIS — Z833 Family history of diabetes mellitus: Secondary | ICD-10-CM | POA: Insufficient documentation

## 2016-11-10 DIAGNOSIS — Z90710 Acquired absence of both cervix and uterus: Secondary | ICD-10-CM | POA: Insufficient documentation

## 2016-11-10 HISTORY — PX: COLONOSCOPY: SHX5424

## 2016-11-10 HISTORY — PX: MALONEY DILATION: SHX5535

## 2016-11-10 HISTORY — PX: ESOPHAGOGASTRODUODENOSCOPY: SHX5428

## 2016-11-10 SURGERY — COLONOSCOPY
Anesthesia: Moderate Sedation

## 2016-11-10 MED ORDER — SODIUM CHLORIDE 0.9 % IV SOLN
INTRAVENOUS | Status: DC
  Administered 2016-11-10: 14:00:00 via INTRAVENOUS

## 2016-11-10 MED ORDER — LIDOCAINE VISCOUS 2 % MT SOLN
OROMUCOSAL | Status: AC
  Filled 2016-11-10: qty 15

## 2016-11-10 MED ORDER — PROMETHAZINE HCL 25 MG/ML IJ SOLN
12.5000 mg | Freq: Once | INTRAMUSCULAR | Status: AC
  Administered 2016-11-10: 12.5 mg via INTRAVENOUS

## 2016-11-10 MED ORDER — STERILE WATER FOR IRRIGATION IR SOLN
Status: DC | PRN
  Administered 2016-11-10: 2.5 mL

## 2016-11-10 MED ORDER — MEPERIDINE HCL 100 MG/ML IJ SOLN
INTRAMUSCULAR | Status: DC | PRN
  Administered 2016-11-10: 50 mg via INTRAVENOUS
  Administered 2016-11-10: 25 mg via INTRAVENOUS

## 2016-11-10 MED ORDER — LIDOCAINE VISCOUS 2 % MT SOLN
OROMUCOSAL | Status: DC | PRN
Start: 2016-11-10 — End: 2016-11-10
  Administered 2016-11-10: 1 via OROMUCOSAL

## 2016-11-10 MED ORDER — MEPERIDINE HCL 100 MG/ML IJ SOLN
INTRAMUSCULAR | Status: AC
  Filled 2016-11-10: qty 2

## 2016-11-10 MED ORDER — SODIUM CHLORIDE 0.9% FLUSH
INTRAVENOUS | Status: AC
  Filled 2016-11-10: qty 10

## 2016-11-10 MED ORDER — PROMETHAZINE HCL 25 MG/ML IJ SOLN
INTRAMUSCULAR | Status: AC
  Filled 2016-11-10: qty 1

## 2016-11-10 MED ORDER — ONDANSETRON HCL 4 MG/2ML IJ SOLN
INTRAMUSCULAR | Status: AC
  Filled 2016-11-10: qty 2

## 2016-11-10 MED ORDER — MIDAZOLAM HCL 5 MG/5ML IJ SOLN
INTRAMUSCULAR | Status: AC
  Filled 2016-11-10: qty 10

## 2016-11-10 MED ORDER — MIDAZOLAM HCL 5 MG/5ML IJ SOLN
INTRAMUSCULAR | Status: DC | PRN
Start: 2016-11-10 — End: 2016-11-10
  Administered 2016-11-10: 2 mg via INTRAVENOUS
  Administered 2016-11-10: 1 mg via INTRAVENOUS

## 2016-11-10 MED ORDER — ONDANSETRON HCL 4 MG/2ML IJ SOLN
INTRAMUSCULAR | Status: DC | PRN
  Administered 2016-11-10: 4 mg via INTRAVENOUS

## 2016-11-10 NOTE — Op Note (Addendum)
[facility] Patient Name: Toni [name] Procedure Date: 11/10/2016 2:02 PM MRN: [id number] Date of Birth: 05-29-1956 Attending MD: Norvel Richards , MD CSN: [id number] Age: 61 Admit Type: Outpatient Procedure:                Upper GI endoscopy with [facility] dilation Indications:              Dysphagia Providers:                Norvel Richards, MD, Lurline Del, RN, Purcell Nails.                            [place], Technician, Aram Candela Referring MD:             Soyla Dryer, PA Medicines:                Midazolam 3 mg IV, Meperidine 75 mg IV,                            Promethazine [id number] mg IV Complications:            No immediate complications. Estimated Blood Loss:     Estimated blood loss: none. Procedure:                Pre-Anesthesia Assessment:                           - Prior to the procedure, a History and Physical                            was performed, and patient medications and                            allergies were reviewed. The patient's tolerance of                            previous anesthesia was also reviewed. The risks                            and benefits of the procedure and the sedation                            options and risks were discussed with the patient.                            All questions were answered, and informed consent                            was obtained. Prior Anticoagulants: The patient has                            taken no previous anticoagulant or antiplatelet                            agents. ASA Grade Assessment: III - A patient with  severe systemic disease. After reviewing the risks                            and benefits, the patient was deemed in                            satisfactory condition to undergo the procedure.                           After obtaining informed consent, the endoscope was                            passed under direct vision. Throughout the             procedure, the patient's blood pressure, pulse, and                            oxygen saturations were monitored continuously. The                            [id number] scope was introduced through the                            mouth, and advanced to the second part of duodenum.                            The upper GI endoscopy was accomplished without                            difficulty. The patient tolerated the procedure                            well. Scope In: 2:38:53 PM Scope Out: 2:46:35 PM Total Procedure Duration: 0 hours 7 minutes 42 seconds  Findings:      The examined esophagus was normal.      A medium-sized hiatal hernia was present.      The cardia and gastric fundus were normal on retroflexion.      The duodenal bulb and second portion of the duodenum were normal. Impression:               - Normal esophagus. Status post dilation                           - Medium-sized hiatal hernia.                           - Normal duodenal bulb and second portion of the                            duodenum.                           - No specimens collected. Moderate Sedation:      Moderate (conscious) sedation was administered by the endoscopy nurse       and supervised by the endoscopist. The following  parameters were       monitored: oxygen saturation, heart rate, blood pressure, respiratory       rate, EKG, adequacy of pulmonary ventilation, and response to care.       Total physician intraservice time was 15 minutes. Recommendation:           - Patient has a contact number available for                            emergencies. The signs and symptoms of potential                            delayed complications were discussed with the                            patient. Return to normal activities tomorrow.                            Written discharge instructions were provided to the                            patient.                           - Resume  previous diet.                           - Continue present medications.                           - Await pathology results.                           - No repeat upper endoscopy.                           - Return to GI office in 6 months. See colonoscopy Procedure Code(s):        --- Professional ---                           [phone], Esophagogastroduodenoscopy, flexible,                            transoral; diagnostic, including collection of                            specimen(s) by brushing or washing, when performed                            (separate procedure)                           99152, Moderate sedation services provided by the                            same physician or other qualified health care  professional performing the diagnostic or                            therapeutic service that the sedation supports,                            requiring the presence of an independent trained                            observer to assist in the monitoring of the                            patient's level of consciousness and physiological                            status; initial 15 minutes of intraservice time,                            patient age 64 years or older Diagnosis Code(s):        --- Professional ---                           K44.9, Diaphragmatic hernia without obstruction or                            gangrene                           R13.10, Dysphagia, unspecified CPT copyright 2016 [employer]. All rights reserved. The codes documented in this report are preliminary and upon coder review may  be revised to meet current compliance requirements. Cristopher Estimable. Rourk, MD Norvel Richards, MD 11/10/2016 4:17:31 PM This report has been signed electronically. Number of Addenda: 1 Addendum Number: 1   Addendum Date: 11/15/2016 3:02:23 PM      patient underwent esophageal with a 54French Maloney dilator followed by       a  17 French Maloney dilator. Cristopher Estimable. Rourk, MD Norvel Richards, MD 11/15/2016 3:03:09 PM This report has been signed electronically.

## 2016-11-10 NOTE — Discharge Instructions (Addendum)
Colonoscopy Discharge Instructions  Read the instructions outlined below and refer to this sheet in the next few weeks. These discharge instructions provide you with general information on caring for yourself after you leave the hospital. Your doctor may also give you specific instructions. While your treatment has been planned according to the most current medical practices available, unavoidable complications occasionally occur. If you have any problems or questions after discharge, call Dr. Gala Romney at [phone]. ACTIVITY  You may resume your regular activity, but move at a slower pace for the next 24 hours.   Take frequent rest periods for the next 24 hours.   Walking will help get rid of the air and reduce the bloated feeling in your belly (abdomen).   No driving for 24 hours (because of the medicine (anesthesia) used during the test).    Do not sign any important legal documents or operate any machinery for 24 hours (because of the anesthesia used during the test).  NUTRITION  Drink plenty of fluids.   You may resume your normal diet as instructed by your doctor.   Begin with a light meal and progress to your normal diet. Heavy or fried foods are harder to digest and may make you feel sick to your stomach (nauseated).   Avoid alcoholic beverages for 24 hours or as instructed.  MEDICATIONS  You may resume your normal medications unless your doctor tells you otherwise.  WHAT YOU CAN EXPECT TODAY  Some feelings of bloating in the abdomen.   Passage of more gas than usual.   Spotting of blood in your stool or on the toilet paper.  IF YOU HAD POLYPS REMOVED DURING THE COLONOSCOPY:  No aspirin products for 7 days or as instructed.   No alcohol for 7 days or as instructed.   Eat a soft diet for the next 24 hours.  FINDING OUT THE RESULTS OF YOUR TEST Not all test results are available during your visit. If your test results are not back during the visit, make an appointment  with your caregiver to find out the results. Do not assume everything is normal if you have not heard from your caregiver or the medical facility. It is important for you to follow up on all of your test results.  SEEK IMMEDIATE MEDICAL ATTENTION IF:  You have more than a spotting of blood in your stool.   Your belly is swollen (abdominal distention).   You are nauseated or vomiting.   You have a temperature over 101.   You have abdominal pain or discomfort that is severe or gets worse throughout the day.    EGD Discharge instructions Please read the instructions outlined below and refer to this sheet in the next few weeks. These discharge instructions provide you with general information on caring for yourself after you leave the hospital. Your doctor may also give you specific instructions. While your treatment has been planned according to the most current medical practices available, unavoidable complications occasionally occur. If you have any problems or questions after discharge, please call your doctor. ACTIVITY  You may resume your regular activity but move at a slower pace for the next 24 hours.   Take frequent rest periods for the next 24 hours.   Walking will help expel (get rid of) the air and reduce the bloated feeling in your abdomen.   No driving for 24 hours (because of the anesthesia (medicine) used during the test).   You may shower.   Do not sign  any important legal documents or operate any machinery for 24 hours (because of the anesthesia used during the test).  NUTRITION  Drink plenty of fluids.   You may resume your normal diet.   Begin with a light meal and progress to your normal diet.   Avoid alcoholic beverages for 24 hours or as instructed by your caregiver.  MEDICATIONS  You may resume your normal medications unless your caregiver tells you otherwise.  WHAT YOU CAN EXPECT TODAY  You may experience abdominal discomfort such as a feeling of  fullness or gas pains.  FOLLOW-UP  Your doctor will discuss the results of your test with you.  SEEK IMMEDIATE MEDICAL ATTENTION IF ANY OF THE FOLLOWING OCCUR:  Excessive nausea (feeling sick to your stomach) and/or vomiting.   Severe abdominal pain and distention (swelling).   Trouble swallowing.   Temperature over 101 F (37.8 C).   Rectal bleeding or vomiting of blood.     GERD and colon diverticulosis information provided  Continue omeprazole daily  Office visit with [place] in 6 months  Repeat colonoscopy in 7 years     Gastroesophageal Reflux Disease, Adult Normally, food travels down the esophagus and stays in the stomach to be digested. However, when a person has gastroesophageal reflux disease (GERD), food and stomach acid move back up into the esophagus. When this happens, the esophagus becomes sore and inflamed. Over time, GERD can create small holes (ulcers) in the lining of the esophagus. What are the causes? This condition is caused by a problem with the muscle between the esophagus and the stomach (lower esophageal sphincter, or LES). Normally, the LES muscle closes after food passes through the esophagus to the stomach. When the LES is weakened or abnormal, it does not close properly, and that allows food and stomach acid to go back up into the esophagus. The LES can be weakened by certain dietary substances, medicines, and medical conditions, including:  Tobacco use.  Pregnancy.  Having a hiatal hernia.  Heavy alcohol use.  Certain foods and beverages, such as coffee, chocolate, onions, and peppermint. What increases the risk? This condition is more likely to develop in:  People who have an increased body weight.  People who have connective tissue disorders.  People who use NSAID medicines. What are the signs or symptoms? Symptoms of this condition include:  Heartburn.  Difficult or painful swallowing.  The feeling of having a lump in the  throat.  Abitter taste in the mouth.  Bad breath.  Having a large amount of saliva.  Having an upset or bloated stomach.  Belching.  Chest pain.  Shortness of breath or wheezing.  Ongoing (chronic) cough or a night-time cough.  Wearing away of tooth enamel.  Weight loss. Different conditions can cause chest pain. Make sure to see your health care provider if you experience chest pain. How is this diagnosed? Your health care provider will take a medical history and perform a physical exam. To determine if you have mild or severe GERD, your health care provider may also monitor how you respond to treatment. You may also have other tests, including:  An endoscopy toexamine your stomach and esophagus with a small camera.  A test thatmeasures the acidity level in your esophagus.  A test thatmeasures how much pressure is on your esophagus.  A barium swallow or modified barium swallow to show the shape, size, and functioning of your esophagus. How is this treated? The goal of treatment is to help relieve your  symptoms and to prevent complications. Treatment for this condition may vary depending on how severe your symptoms are. Your health care provider may recommend:  Changes to your diet.  Medicine.  Surgery. Follow these instructions at home: Diet  Follow a diet as recommended by your health care provider. This may involve avoiding foods and drinks such as:  Coffee and tea (with or without caffeine).  Drinks that containalcohol.  Energy drinks and sports drinks.  Carbonated drinks or sodas.  Chocolate and cocoa.  Peppermint and mint flavorings.  Garlic and onions.  Horseradish.  Spicy and acidic foods, including peppers, chili powder, curry powder, vinegar, hot sauces, and barbecue sauce.  Citrus fruit juices and citrus fruits, such as oranges, lemons, and limes.  Tomato-based foods, such as red sauce, chili, salsa, and pizza with red sauce.  Fried  and fatty foods, such as donuts, french fries, potato chips, and high-fat dressings.  High-fat meats, such as hot dogs and fatty cuts of red and white meats, such as rib eye steak, sausage, ham, and bacon.  High-fat dairy items, such as whole milk, butter, and cream cheese.  Eat small, frequent meals instead of large meals.  Avoid drinking large amounts of liquid with your meals.  Avoid eating meals during the 2-3 hours before bedtime.  Avoid lying down right after you eat.  Do not exercise right after you eat. General instructions  Pay attention to any changes in your symptoms.  Take over-the-counter and prescription medicines only as told by your health care provider. Do not take aspirin, ibuprofen, or other NSAIDs unless your health care provider told you to do so.  Do not use any tobacco products, including cigarettes, chewing tobacco, and e-cigarettes. If you need help quitting, ask your health care provider.  Wear loose-fitting clothing. Do not wear anything tight around your waist that causes pressure on your abdomen.  Raise (elevate) the head of your bed 6 inches (15cm).  Try to reduce your stress, such as with yoga or meditation. If you need help reducing stress, ask your health care provider.  If you are overweight, reduce your weight to an amount that is healthy for you. Ask your health care provider for guidance about a safe weight loss goal.  Keep all follow-up visits as told by your health care provider. This is important. Contact a health care provider if:  You have new symptoms.  You have unexplained weight loss.  You have difficulty swallowing, or it hurts to swallow.  You have wheezing or a persistent cough.  Your symptoms do not improve with treatment.  You have a hoarse voice. Get help right away if:  You have pain in your arms, neck, jaw, teeth, or back.  You feel sweaty, dizzy, or light-headed.  You have chest pain or shortness of  breath.  You vomit and your vomit looks like blood or coffee grounds.  You faint.  Your stool is bloody or black.  You cannot swallow, drink, or eat. This information is not intended to replace advice given to you by your health care provider. Make sure you discuss any questions you have with your health care provider. Document Released: 08/04/2005 Document Revised: 03/24/2016 Document Reviewed: 02/19/2015 Elsevier Interactive Patient Education  2017 [employer]     Diverticulosis Diverticulosis is the condition that develops when small pouches (diverticula) form in the wall of your colon. Your colon, or large intestine, is where water is absorbed and stool is formed. The pouches form when the inside  layer of your colon pushes through weak spots in the outer layers of your colon. CAUSES  No one knows exactly what causes diverticulosis. RISK FACTORS  Being older than 56. Your risk for this condition increases with age. Diverticulosis is rare in people younger than 40 years. By age 37, almost everyone has it.  Eating a low-fiber diet.  Being frequently constipated.  Being overweight.  Not getting enough exercise.  Smoking.  Taking over-the-counter pain medicines, like aspirin and ibuprofen. SYMPTOMS  Most people with diverticulosis do not have symptoms. DIAGNOSIS  Because diverticulosis often has no symptoms, health care providers often discover the condition during an exam for other colon problems. In many cases, a health care provider will diagnose diverticulosis while using a flexible scope to examine the colon (colonoscopy). TREATMENT  If you have never developed an infection related to diverticulosis, you may not need treatment. If you have had an infection before, treatment may include:  Eating more fruits, vegetables, and grains.  Taking a fiber supplement.  Taking a live bacteria supplement (probiotic).  Taking medicine to relax your colon. HOME CARE  INSTRUCTIONS   Drink at least 6-8 glasses of water each day to prevent constipation.  Try not to strain when you have a bowel movement.  Keep all follow-up appointments. If you have had an infection before:  Increase the fiber in your diet as directed by your health care provider or dietitian.  Take a dietary fiber supplement if your health care provider approves.  Only take medicines as directed by your health care provider. SEEK MEDICAL CARE IF:   You have abdominal pain.  You have bloating.  You have cramps.  You have not gone to the bathroom in 3 days. SEEK IMMEDIATE MEDICAL CARE IF:   Your pain gets worse.  Yourbloating becomes very bad.  You have a fever or chills, and your symptoms suddenly get worse.  You begin vomiting.  You have bowel movements that are bloody or black. MAKE SURE YOU:  Understand these instructions.  Will watch your condition.  Will get help right away if you are not doing well or get worse. This information is not intended to replace advice given to you by your health care provider. Make sure you discuss any questions you have with your health care provider. Document Released: 07/22/2004 Document Revised: 10/30/2013 Document Reviewed: 09/19/2013 Elsevier Interactive Patient Education  2017 [employer].

## 2016-11-10 NOTE — H&P (View-Only) (Signed)
Primary Care Physician:  Soyla Dryer, PA-C Primary Gastroenterologist:  Dr. Gala Romney   Chief Complaint  Patient presents with  . Colonoscopy    HPI:   Toni [name] is a 61 y.o. female presenting today at the request of Soyla Dryer, PA-C, for surveillance colonoscopy due to history of polyps. She had a colonoscopy in 2011 by Dr. Laural Golden at [facility] with 3 mm polyp from hepatic flexure. Unknown path at this time.   Notes intermittent lower abdominal cramping, feels like she needs to have a BM. States she has IBS. Sometimes constipated, sometimes goes to diarrhea related to food choices. Pain improved after BM. Feels like she is normally more constipated than loose stool. Will go every day for about 3 days then skip days, all while not feeling productive. Stool is hard. No rectal bleeding. No unexplained weight loss or lack of appetite.   Recently had a TEE that showed repaired mitral valve with moderately increased pressure due to narrowing of the opening; consideration for valve replacement raised but for now treating medically. Followed by Dr. Harl Bowie.   Notes globus sensation; feels like something is hung in her throat all the time. Will also try to take medications and it feels like it gets hung up, having to drink a lot of water to get it to go down. Solid food hangs up in esophagus as well. Taking Prilosec once daily for GERD. Burning in lower chest with some foods. States she has had her esophagus stretched twice in the remote past. Review of records show Dr. Leanora Ivanoff completed dilation in 2004, then Dr. Gala Romney in 2005 pursued an EGD with normal esophagus s/p dilation. Small hiatal hernia noted. Felt improved after dilation.     Past Medical History:  Diagnosis Date  . Congestive heart failure, unspecified    Diastolic heart failure  . Degeneration of lumbar or lumbosacral intervertebral disc   . Depression   . Dyspnea     chronic multifactorial large component of  deconditioning  . Esophageal reflux   . Insomnia    Working third shift  . Mitral valve disorders(424.0)    Mild to moderate mitral regurgitation  . Mitral valve insufficiency and aortic valve insufficiency   . Morbid obesity ([employer])   . Normocytic anemia   . Other chest pain   . Other dyspnea and respiratory abnormality   . Paroxysmal atrial fibrillation ([name])    Stable no recurrence  . Peripheral edema    chronic  . Unspecified essential hypertension     Past Surgical History:  Procedure Laterality Date  . APPENDECTOMY    . BREAST BIOPSY Left 2009  . CARDIAC VALVE SURGERY  02/27/2015   Mikel Cella. Mitral Valve Repair. Radical Repair with resection of P2 Prolapse, 26 mm Physio II Ring Annuloplasty, Tricuspid Valve Repair (26 mm Ring Annuloplasty, Cox Maze IV Procedure on 02/27/2015  . CHOLECYSTECTOMY    . COLONOSCOPY  2011   Dr. Laural Golden: 22mm polyp from hepatic flexure (path unknown)   . ESOPHAGOGASTRODUODENOSCOPY  2005    Dr. Gala Romney : normal esophagus s/p dilation. Small hiatal hernia noted. Felt improved after dilation.   Marland Kitchen HEEL SPUR SURGERY Right   . TOTAL ABDOMINAL HYSTERECTOMY    . TOTAL KNEE ARTHROPLASTY Right   . TUBAL LIGATION      Current Outpatient Prescriptions  Medication Sig Dispense Refill  . aspirin 325 MG EC tablet Take 325 mg by mouth daily.    Marland Kitchen dimenhyDRINATE (DRAMAMINE) 50 MG tablet Take 50  mg by mouth at bedtime as needed (to help her sleep).    Marland Kitchen FLUoxetine (PROZAC) 20 MG capsule Take 1 capsule (20 mg total) by mouth daily. 90 capsule 1  . gabapentin (NEURONTIN) 300 MG capsule TAKE 300MG  1 TAB IN AM AND 600MG  2 TABS PM 90 capsule 3  . ibuprofen (ADVIL,MOTRIN) 200 MG tablet Take 800 mg by mouth every 8 (eight) hours as needed for mild pain. Alternates with Norco if needed    . indomethacin (INDOCIN) 25 MG capsule 1 po q 8 hour prn gout pain 60 capsule 1  . lisinopril (PRINIVIL,ZESTRIL) 10 MG tablet Take 1 tablet (10 mg total) by mouth daily. 90 tablet 1  .  magnesium oxide (MAG-OX) 400 MG tablet Take 400 mg by mouth 2 (two) times daily.    . metoprolol (LOPRESSOR) 100 MG tablet Take 1 tablet (100 mg total) by mouth 2 (two) times daily. 180 tablet 1  . metoprolol tartrate (LOPRESSOR) 25 MG tablet Take 1 tablet (25 mg total) by mouth 2 (two) times daily. 180 tablet 3  . omeprazole (PRILOSEC) 40 MG capsule Take 1 capsule (40 mg total) by mouth daily. 90 capsule 1  . potassium chloride SA (K-DUR,KLOR-CON) 20 MEQ tablet Take 99 mEq by mouth daily.     Marland Kitchen torsemide (DEMADEX) 20 MG tablet Take 20 mg by mouth daily.     . diphenhydrAMINE (BENADRYL) 25 MG tablet Take 25 mg by mouth at bedtime as needed for sleep.     Marland Kitchen HYDROcodone-acetaminophen (NORCO) 7.5-325 MG tablet Take 1 tablet by mouth every 6 (six) hours as needed for moderate pain.     No current facility-administered medications for this visit.     Allergies as of 10/20/2016 - Review Complete 10/20/2016  Allergen Reaction Noted  . Iodinated diagnostic agents  09/01/2011    Family History  Problem Relation Age of Onset  . Hypertension Mother   . Heart disease Mother   . Kidney disease Father   . Stroke Father   . Diabetes Sister   . Kidney disease Sister   . Heart disease Sister   . Hypertension Sister   . Heart disease Brother   . Heart attack Son   . Hypertension Son   . Cancer Paternal Grandmother     Breast Cancer  . Heart disease Brother   . Colon cancer Neg Hx     Social History   Social History  . Marital status: Single    Spouse name: N/A  . Number of children: N/A  . Years of education: N/A   Occupational History  . Not on file.   Social History Main Topics  . Smoking status: Former Smoker    Packs/day: 0.50    Years: 25.00    Types: Cigarettes    Quit date: 1997  . Smokeless tobacco: Former Systems developer    Types: [place] date: 1975     Comment: dipped tobacco during pregancy in 1975  . Alcohol use No  . Drug use: No  . Sexual activity: Yes    Partners:  Male   Other Topics Concern  . Not on file   Social History Narrative  . No narrative on file    Review of Systems: Gen: Denies any fever, chills, fatigue, weight loss, lack of appetite.  CV: chest tightness, DOE, evaluated by cardiology  GI: see HPI  GU : Denies urinary burning, urinary frequency, urinary hesitancy MS: +joint pain  Derm: Denies rash, itching, dry  skin Psych: Denies depression, anxiety, memory loss, and confusion Heme: see HPI   Physical Exam: BP 111/66   Pulse 73   Temp 98.3 F (36.8 C) (Oral)   Ht 5\' 8"  (1.727 m)   Wt 238 lb 6.4 oz (108.1 kg)   LMP  (LMP Unknown)   BMI 36.25 kg/m  General:   Alert and oriented. Pleasant and cooperative. Well-nourished and well-developed.  Head:  Normocephalic and atraumatic. Eyes:  Without icterus, sclera clear and conjunctiva pink.  Ears:  Normal auditory acuity. Nose:  No deformity, discharge,  or lesions. Lungs:  Clear to auscultation bilaterally. No wheezes, rales, or rhonchi. No distress.  Heart:  S1, S2 present without murmur  Abdomen:  +BS, soft, non-tender and non-distended. No HSM noted. No guarding or rebound. No masses appreciated.  Rectal:  Deferred  Msk:  Symmetrical without gross deformities. Normal posture. Extremities:  Without  edema. Neurologic:  Alert and  oriented x4 Psych:  Alert and cooperative. Normal mood and affect.

## 2016-11-10 NOTE — Op Note (Signed)
[facility] Patient Name: Toni [name] Procedure Date: 11/10/2016 2:48 PM MRN: [id number] Date of Birth: [birth year] Attending MD: Norvel Richards , MD CSN: [id number] Age: 61 Admit Type: Outpatient Procedure:                Colonoscopy Indications:              High risk colon cancer surveillance: Personal                            history of colonic polyps Providers:                Norvel Richards, MD, Lurline Del, RN, [facility], Technician, Aram Candela Referring MD:              Medicines:                Midazolam 3 mg IV, Meperidine 75 mg IV, Ondansetron                            4 mg IV, Promethazine [id number] mg IV Complications:            No immediate complications. Estimated Blood Loss:     Estimated blood loss: none. Procedure:                Pre-Anesthesia Assessment:                           - Prior to the procedure, a History and Physical                            was performed, and patient medications and                            allergies were reviewed. The patient's tolerance of                            previous anesthesia was also reviewed. The risks                            and benefits of the procedure and the sedation                            options and risks were discussed with the patient.                            All questions were answered, and informed consent                            was obtained. ASA Grade Assessment: III - A patient                            with severe systemic disease. After reviewing the  risks and benefits, the patient was deemed in                            satisfactory condition to undergo the procedure.                           After obtaining informed consent, the colonoscope                            was passed under direct vision. Throughout the                            procedure, the patient's blood pressure, pulse, and     oxygen saturations were monitored continuously. The                            EC-[id number]) scope was introduced through                            the anus and advanced to the the cecum, identified                            by appendiceal orifice and ileocecal valve. The                            ileocecal valve, appendiceal orifice, and rectum                            were photographed. The colonoscopy was performed                            without difficulty. The colonoscopy was performed                            without difficulty. The entire colon was well                            visualized. The quality of the bowel preparation                            was adequate. Scope In: 2:54:36 PM Scope Out: 3:04:16 PM Scope Withdrawal Time: 0 hours 6 minutes 13 seconds  Total Procedure Duration: 0 hours 9 minutes 40 seconds  Findings:      The perianal and digital rectal examinations were normal.      Scattered small and large-mouthed diverticula were found in the sigmoid       colon and descending colon.      The exam was otherwise without abnormality on direct and retroflexion       views. Impression:               - Diverticulosis in the sigmoid colon and in the                            descending colon.                           -  The examination was otherwise normal on direct                            and retroflexion views.                           - No specimens collected. Moderate Sedation:      Moderate (conscious) sedation was administered by the endoscopy nurse       and supervised by the endoscopist. The following parameters were       monitored: oxygen saturation, heart rate, blood pressure, respiratory       rate, EKG, adequacy of pulmonary ventilation, and response to care.       Total physician intraservice time was 33 minutes. Recommendation:           - Patient has a contact number available for                            emergencies. The signs  and symptoms of potential                            delayed complications were discussed with the                            patient. Return to normal activities tomorrow.                            Written discharge instructions were provided to the                            patient.                           - Advance diet as tolerated.                           - Continue present medications.                           - Repeat colonoscopy in 7 years for surveillance.                            See EGD report Procedure Code(s):        --- Professional ---                           [phone], Colonoscopy, flexible; diagnostic, including                            collection of specimen(s) by brushing or washing,                            when performed (separate procedure)                           99152, Moderate sedation services provided by the  same physician or other qualified health care                            professional performing the diagnostic or                            therapeutic service that the sedation supports,                            requiring the presence of an independent trained                            observer to assist in the monitoring of the                            patient's level of consciousness and physiological                            status; initial 15 minutes of intraservice time,                            patient age 79 years or older                           [phone], Moderate sedation services; each additional                            15 minutes intraservice time Diagnosis Code(s):        --- Professional ---                           Z86.010, Personal history of colonic polyps                           K57.30, Diverticulosis of large intestine without                            perforation or abscess without bleeding CPT copyright 2016 American Medical Association. All rights reserved. The codes documented in  this report are preliminary and upon coder review may  be revised to meet current compliance requirements. Cristopher Estimable. Sawyer Kahan, MD Norvel Richards, MD 11/10/2016 3:19:49 PM This report has been signed electronically. Number of Addenda: 0

## 2016-11-10 NOTE — Interval H&P Note (Signed)
History and Physical Interval Note:  11/10/2016 2:29 PM  Toni [name]  has presented today for surgery, with the diagnosis of tubular adenoma, esophageal dysphagia  The various methods of treatment have been discussed with the patient and family. After consideration of risks, benefits and other options for treatment, the patient has consented to  Procedure(s) with comments: COLONOSCOPY (N/A) - 2:00 pm ESOPHAGOGASTRODUODENOSCOPY (EGD) (N/A) MALONEY DILATION (N/A) as a surgical intervention .  The patient's history has been reviewed, patient examined, no change in status, stable for surgery.  I have reviewed the patient's chart and labs.  Questions were answered to the patient's satisfaction.     Toni [name]  No change. EGD/ED and colonoscopy per plan.  The risks, benefits, limitations, imponderables and alternatives regarding both EGD and colonoscopy have been reviewed with the patient. Questions have been answered. All parties agreeable.

## 2016-11-16 ENCOUNTER — Encounter (HOSPITAL_COMMUNITY): Payer: Self-pay | Admitting: Internal Medicine

## 2016-12-01 ENCOUNTER — Encounter: Payer: Self-pay | Admitting: Cardiology

## 2016-12-01 ENCOUNTER — Ambulatory Visit (INDEPENDENT_AMBULATORY_CARE_PROVIDER_SITE_OTHER): Payer: Self-pay | Admitting: Cardiology

## 2016-12-01 VITALS — BP 150/75 | HR 62 | Ht 68.0 in | Wt 242.0 lb

## 2016-12-01 DIAGNOSIS — I10 Essential (primary) hypertension: Secondary | ICD-10-CM

## 2016-12-01 DIAGNOSIS — R0600 Dyspnea, unspecified: Secondary | ICD-10-CM

## 2016-12-01 DIAGNOSIS — I050 Rheumatic mitral stenosis: Secondary | ICD-10-CM

## 2016-12-01 NOTE — Patient Instructions (Signed)
Your physician recommends that you schedule a follow-up appointment in: 3 MONTHS WITH DR. [place]ios  Your physician recommends that you continue on your current medications as directed. Please refer to the Current Medication list given to you today.  Your physician recommends that you return for lab work BMP/MG  You have been referred to [place] physician has requested that you regularly monitor and record your blood pressure readings at home FOR 1 WEEK . Please use the same machine at the same time of day to check your readings and record them to bring to your follow-up visit.  Thank you for choosing [facility]!!

## 2016-12-01 NOTE — Progress Notes (Signed)
Clinical Summary [name] is a 61 y.o.female seen today for follow up of the following medical problems.  1. Mitral regurgitation - history of previous MV repair at [facility] 02/2015 - Radical Repair with resection of P2 Prolapse, 26 mm Physio II Ring Annuloplasty, Tricuspid Valve Repair (26 mm Ring Annuloplasty, Cox Maze IV Procedure on 02/27/2015 - cath prior to surgery showed no significant CAD - post repair echo 02/2015 showed no MR, mild TR. There are no reported gradients across the valves in the report - has had some recent SOB. Increased DOE over the last few months, at 1/2 block. Subjective abdominal distension. +orthopnea x 3 pillows which is chronic. Reports some weight gain.    -08/2016 echo showed elevated gradients across her repaired MV and TV consistent with post repair stenosis.  - TEE 10/2016 showed restricted motion of posterior MV leaflet with mitral stenosis, mean resting gradient of 8 mmHg.  - DOE at short distances, can be room to room at home. Or example walking to car or mailbox. Continued orthopnea - no recent LE edema, some abdominal distension. - compliant with torsemide 20mg  daily. Home weights around [place].     2. Afib - reports recent palpitations, typically with activity but can occur at rest. Lasts just a few minutes. Occurs 1-2 times a week - can have sudden dizziness at times.  - has not been on anticoag.  - 06/2016 event monitor without evidence of afib, occasional PVCs.   - denies any recent palpitations.    3. 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 Congestive heart failure, unspecified    Diastolic heart failure  . Degeneration of lumbar or lumbosacral intervertebral disc   . Depression   . Dyspnea     chronic multifactorial large component of deconditioning  . Esophageal reflux   .  Insomnia    Working third shift  . Mitral valve disorders(424.0)    Mild to moderate mitral regurgitation  . Mitral valve insufficiency and aortic valve insufficiency   . Morbid obesity ([place])   . Normocytic anemia   . Other chest pain   . Other dyspnea and respiratory abnormality   . Paroxysmal atrial fibrillation ([name])    Stable no recurrence  . Peripheral edema    chronic  . Unspecified essential hypertension      Allergies  Allergen Reactions  . Iodinated Diagnostic Agents Swelling     Current Outpatient Prescriptions  Medication Sig Dispense Refill  . aspirin 325 MG EC tablet Take 325 mg by mouth daily.    Marland Kitchen dimenhyDRINATE (DRAMAMINE) 50 MG tablet Take 50 mg by mouth at bedtime as needed (to help her sleep).    . diphenhydrAMINE (BENADRYL) 25 MG tablet Take 25 mg by mouth at bedtime as needed for allergies or sleep.     Marland Kitchen FLUoxetine (PROZAC) 20 MG capsule Take 1 capsule (20 mg total) by mouth daily. 90 capsule 1  . gabapentin (NEURONTIN) 300 MG capsule TAKE 300MG  1 TAB IN AM AND 600MG  2 TABS PM (Patient taking differently: Take 300mg s in the morning and 600mg s in the evening) 90 capsule 3  . ibuprofen (ADVIL,MOTRIN) 200 MG tablet Take 800 mg by mouth every 8 (eight) hours as needed for mild pain. Alternates with Norco if needed    . indomethacin (INDOCIN) 25 MG capsule 1 po  q 8 hour prn gout pain 60 capsule 1  . linaclotide (LINZESS) 72 MCG capsule Take 72 mcg by mouth daily before breakfast.    . Liniments (SALONPAS PAIN RELIEF PATCH EX) Apply 1 patch topically daily as needed (pain).    Marland Kitchen lisinopril (PRINIVIL,ZESTRIL) 10 MG tablet Take 1 tablet (10 mg total) by mouth daily. 90 tablet 1  . Magnesium 250 MG TABS Take 250 mg by mouth 2 (two) times daily.    . metoprolol (LOPRESSOR) 100 MG tablet Take 1 tablet (100 mg total) by mouth 2 (two) times daily. 180 tablet 1  . metoprolol tartrate (LOPRESSOR) 25 MG tablet Take 1 tablet (25 mg total) by mouth 2 (two) times daily. 180  tablet 3  . omeprazole (PRILOSEC) 40 MG capsule Take 1 capsule (40 mg total) by mouth daily. 90 capsule 1  . Potassium 99 MG TABS Take 99 mg by mouth daily.    Marland Kitchen torsemide (DEMADEX) 20 MG tablet Take 20 mg by mouth daily.      No current facility-administered medications for this visit.      Past Surgical History:  Procedure Laterality Date  . APPENDECTOMY    . BREAST BIOPSY Left 2009  . CARDIAC VALVE SURGERY  02/27/2015   Toni [name]. Mitral Valve Repair. Radical Repair with resection of P2 Prolapse, 26 mm Physio II Ring Annuloplasty, Tricuspid Valve Repair (26 mm Ring Annuloplasty, Cox Maze IV Procedure on 02/27/2015  . CHOLECYSTECTOMY    . COLONOSCOPY  2011   Toni [name]: 42mm polyp from hepatic flexure (tubular adenoma)  . COLONOSCOPY N/A 11/10/2016   Procedure: COLONOSCOPY;  Surgeon: Toni Dolin, MD;  Location: [facility];  Service: Endoscopy;  Laterality: N/A;  2:00 pm  . ESOPHAGOGASTRODUODENOSCOPY  2005    Dr. Gala [name] : normal esophagus s/p dilation. Small hiatal hernia noted. Felt improved after dilation.   . ESOPHAGOGASTRODUODENOSCOPY N/A 11/10/2016   Procedure: ESOPHAGOGASTRODUODENOSCOPY (EGD);  Surgeon: Toni Dolin, MD;  Location: [facility];  Service: Endoscopy;  Laterality: N/A;  . HEEL SPUR SURGERY Right   . MALONEY DILATION N/A 11/10/2016   Procedure: Toni [name] DILATION;  Surgeon: Toni Dolin, MD;  Location: [facility];  Service: Endoscopy;  Laterality: N/A;  . TEE WITHOUT CARDIOVERSION N/A 10/18/2016   Procedure: TRANSESOPHAGEAL ECHOCARDIOGRAM (TEE);  Surgeon: Toni Lenis, MD;  Location: [facility];  Service: Endoscopy;  Laterality: N/A;  . TOTAL ABDOMINAL HYSTERECTOMY    . TOTAL KNEE ARTHROPLASTY Right   . TUBAL LIGATION       Allergies  Allergen Reactions  . Iodinated Diagnostic Agents Swelling      Family History  Problem Relation Age of Onset  . Hypertension Mother   . Heart disease Mother   . Kidney disease Father   . Stroke Father   .  Diabetes Sister   . Kidney disease Sister   . Heart disease Sister   . Hypertension Sister   . Heart disease Brother   . Heart attack Son   . Hypertension Son   . Cancer Paternal Grandmother     Breast Cancer  . Heart disease Brother   . Colon cancer Neg Hx      Social History [name] reports that she quit smoking about 21 years ago. Her smoking use included Cigarettes. She has a 12.50 pack-year smoking history. She quit smokeless tobacco use about 43 years ago. Her smokeless tobacco use included Chew. [name] reports that she does not drink alcohol.  Review of Systems CONSTITUTIONAL: No weight loss, fever, chills, weakness or fatigue.  HEENT: Eyes: No visual loss, blurred vision, double vision or yellow sclerae.No hearing loss, sneezing, congestion, runny nose or sore throat.  SKIN: No rash or itching.  CARDIOVASCULAR: per HPI RESPIRATORY: +SOB/DOE.  GASTROINTESTINAL: No anorexia, nausea, vomiting or diarrhea. No abdominal pain or blood.  GENITOURINARY: No burning on urination, no polyuria NEUROLOGICAL: No headache, dizziness, syncope, paralysis, ataxia, numbness or tingling in the extremities. No change in bowel or bladder control.  MUSCULOSKELETAL: No muscle, back pain, joint pain or stiffness.  LYMPHATICS: No enlarged nodes. No history of splenectomy.  PSYCHIATRIC: No history of depression or anxiety.  ENDOCRINOLOGIC: No reports of sweating, cold or heat intolerance. No polyuria or polydipsia.  Marland Kitchen   Physical Examination Vitals:   12/01/16 1432  BP: (!) 150/75  Pulse: 62   Vitals:   12/01/16 1432  Weight: 242 lb (109.8 kg)  Height: 5\' 8"  (1.727 m)    Gen: resting comfortably, no acute distress HEENT: no scleral icterus, pupils equal round and reactive, no palptable cervical adenopathy,  CV: RRR, 2/6 systolic murmur at apex, no jvd Resp: Clear to auscultation bilaterally GI: abdomen is soft, non-tender, non-distended, normal bowel sounds, no  hepatosplenomegaly MSK: extremities are warm, no edema.  Skin: warm, no rash Neuro:  no focal deficits Psych: appropriate affect   Diagnostic Studies 08/2016 echo Study Conclusions  - Left ventricle: The cavity size was normal. Wall thickness was   increased in a pattern of mild LVH. Systolic function was normal.   The estimated ejection fraction was in the range of 50% to 55%.   Wall motion was normal; there were no regional wall motion   abnormalities. The study was not technically sufficient to allow   evaluation of LV diastolic dysfunction due to atrial   fibrillation. - Aortic valve: Mildly calcified annulus. Trileaflet; mildly   thickened leaflets. There was mild regurgitation. Valve area   (VTI): 2.47 cm^2. Valve area (Vmax): 2.47 cm^2. Valve area   (Vmean): 2.43 cm^2. - Mitral valve: The is a MV anular ring present s/p mitral valve   repair. There is an increased gradient across the MV. Mean   gradient 12 mmHg. - Left atrium: The atrium was severely dilated. - Right ventricle: The cavity size was mildly dilated. Systolic   function was mildly to moderately reduced. - Right atrium: The atrium was mildly dilated. - Tricuspid valve: A TV anular ring is present. Mean gradient   across the TV of 7 mmHg. - Pulmonary arteries: Systolic pressure was mildly increased. PA   peak pressure: 31 mm Hg (S). - Technically adequate study.   10/2016 TEE Study Conclusions  - Left ventricle: The cavity size was normal. Wall thickness was   normal. Systolic function was normal. The estimated ejection   fraction was in the range of 55% to 60%. - Mitral valve: The posterior leaflet is moderately thickened with   severely restricted motion. There is a moderate to severe   gradient across the MV of 8 mmHg. There was mild regurgitation.   Mean gradient (D): 8 mm Hg. - Left atrium: The atrium was severely dilated. - Right atrium: No evidence of thrombus in the atrial cavity or    appendage.    Assessment and Plan  1. Valvular heart disease - previous MV and TV repair - recent TTE and TEE shows elevated gradient across her MV. Mean gradient of 8 across MV at rest, restricted motion of posterior  leaflet.  - given her degree of SOB/DOE, we will ask she be evaluated by CT surgery Dr Roxy Manns to get his thoughts on the previous repair and current hemodynamics given her symptoms.   2. Afib - from previous notes looks like there were discussions about anticoag however patient was never committed to it.  - CHADS2Vasc score is 2, anticoag would be indicated.  - she is s/p Maze procedure. Recent monitor did not show any recurrence of afib - we will continue to monitor  3. AKI - significant uptrend in Cr on recent labs, we will repeat blood work.  4. HTN - elevated in clinic, home numbers have been at goal - she will submit bp log in 1 week.   F/u 3 months      Toni [name], M.D.

## 2016-12-02 ENCOUNTER — Other Ambulatory Visit: Payer: Self-pay | Admitting: *Deleted

## 2016-12-02 ENCOUNTER — Other Ambulatory Visit: Payer: Self-pay | Admitting: Physician Assistant

## 2016-12-02 DIAGNOSIS — I10 Essential (primary) hypertension: Secondary | ICD-10-CM

## 2016-12-03 LAB — BASIC METABOLIC PANEL
BUN: 17 mg/dL (ref 7–25)
CO2: 24 mmol/L (ref 20–31)
Calcium: 9.5 mg/dL (ref 8.6–10.4)
Chloride: 107 mmol/L (ref 98–110)
Creat: 1.04 mg/dL — ABNORMAL HIGH (ref 0.50–0.99)
Glucose, Bld: 90 mg/dL (ref 65–99)
Potassium: 4.3 mmol/L (ref 3.5–5.3)
Sodium: 141 mmol/L (ref 135–146)

## 2016-12-03 LAB — MAGNESIUM: Magnesium: 2.2 mg/dL (ref 1.5–2.5)

## 2016-12-14 ENCOUNTER — Other Ambulatory Visit: Payer: Self-pay | Admitting: Physician Assistant

## 2016-12-14 ENCOUNTER — Encounter: Payer: Self-pay | Admitting: Thoracic Surgery (Cardiothoracic Vascular Surgery)

## 2016-12-14 MED ORDER — TORSEMIDE 20 MG PO TABS: 20.0000 mg | ORAL_TABLET | Freq: Every day | ORAL | 2 refills | Status: DC

## 2016-12-15 ENCOUNTER — Other Ambulatory Visit: Payer: Self-pay | Admitting: *Deleted

## 2016-12-15 MED ORDER — GABAPENTIN 300 MG PO CAPS: ORAL_CAPSULE | ORAL | 1 refills | Status: DC

## 2016-12-15 NOTE — Telephone Encounter (Signed)
Arnoldo Lenis, MD  Massie Maroon, CMA        Yes   JB   Previous Messages    ----- Message -----  From: Massie Maroon, CMA  Sent: 12/15/2016 12:01 PM  To: Arnoldo Lenis, MD   Are we refilling this pt Gabapentin? We sent it in originally in 08/2016.   Jonte Wollam

## 2016-12-16 ENCOUNTER — Encounter: Payer: Self-pay | Admitting: Thoracic Surgery (Cardiothoracic Vascular Surgery)

## 2016-12-16 ENCOUNTER — Institutional Professional Consult (permissible substitution) (INDEPENDENT_AMBULATORY_CARE_PROVIDER_SITE_OTHER): Payer: Self-pay | Admitting: Thoracic Surgery (Cardiothoracic Vascular Surgery)

## 2016-12-16 VITALS — BP 119/62 | HR 66 | Resp 16 | Ht 67.0 in | Wt 240.0 lb

## 2016-12-16 DIAGNOSIS — I080 Rheumatic disorders of both mitral and aortic valves: Secondary | ICD-10-CM

## 2016-12-16 DIAGNOSIS — Z9889 Other specified postprocedural states: Secondary | ICD-10-CM

## 2016-12-16 DIAGNOSIS — Z8679 Personal history of other diseases of the circulatory system: Secondary | ICD-10-CM

## 2016-12-16 DIAGNOSIS — R0601 Orthopnea: Secondary | ICD-10-CM

## 2016-12-16 DIAGNOSIS — I050 Rheumatic mitral stenosis: Secondary | ICD-10-CM | POA: Insufficient documentation

## 2016-12-16 DIAGNOSIS — I5032 Chronic diastolic (congestive) heart failure: Secondary | ICD-10-CM

## 2016-12-16 DIAGNOSIS — R0602 Shortness of breath: Secondary | ICD-10-CM

## 2016-12-16 DIAGNOSIS — I342 Nonrheumatic mitral (valve) stenosis: Secondary | ICD-10-CM

## 2016-12-16 NOTE — Patient Instructions (Signed)
Continue all previous medications without any changes at this time  Monitor your weight daily and keep a log for your cardiologist to review.  Consider taking extra diuretics (fluid pill) if you are experiencing increased shortness of breath, swelling, weight gain or other signs of fluid retention  You have been advised of the numerous benefits associated with regular exercise and weight loss.  The long term benefits for your overall health and wellbeing cannot be overestimated.

## 2016-12-16 NOTE — Progress Notes (Signed)
Toni [name]       Toni [name]             [phone]     CARDIOTHORACIC SURGERY CONSULTATION REPORT  Referring Provider is Toni [name] PCP is [facility] [name]  Chief Complaint  Patient presents with  . Mitral Stenosis    TEE 09/23/16....s/p MVR/TVR/MAZE 02/2015 @ [name]    HPI:  Patient is [name] with history of mitral valve prolapse and mitral regurgitation status post mitral valve repair, tricuspid valve repair, and Maze procedure at [name] in [place] in [id number], chronic diastolic congestive heart failure, hypertension, atrial fibrillation, iron deficiency anemia, stage II chronic kidney disease, and chronic back pain who has been referred for surgical consultation to discuss treatment options for management of functional mitral stenosis.  Patient states she has known that she had a heart murmur for many years. She also describes a long history of symptoms of exertional shortness of breath, orthopnea, and lower extremity edema dating back more than 10 years. In March 2016 she states she was hospitalized with congestive heart failure and atrial fibrillation. She was initially treated medically and placed on some type of novel oral anticoagulant medication for which she received samples from the hospital. She was discharged home but seen in follow-up by her cardiologist within a few weeks at which time she was still having severe symptoms of exertional shortness of breath. She was hospitalized at [name] in [place] where she underwent diagnostic cardiac catheterization demonstrating normal coronary artery anatomy with no significant coronary artery disease. She was noted have mild pulmonary hypertension at that time. She ultimately underwent mitral valve repair, tricuspid valve repair, and Maze procedure by Dr. Francoise [name] on 02/27/2015. Details of the procedure are  not currently available but the patient reportedly underwent "radical repair" including resection of the P2 segment of posterior leaflet with 26 mm Edwards Physio 2 ring annuloplasty, tricuspid valve repair using a 26 mm ring annuloplasty, and Maze procedure. The patient states that she remained in the hospital for nearly 2 weeks following her surgery but suffered no significant complications. She states that she gradually improved and experienced some improvement in her breathing early following surgery. She was last seen by her previous cardiologist on 06/10/2015 when she was reported to complain of incisional pain without any shortness of breath. She started the outpatient cardiac rehabilitation program but failed to complete it when she lost her insurance. She states that she has continued to experience exertional shortness of breath ever since, but over the past year symptoms seem to have gotten worse. She was first evaluated by [name] on 06/29/2016 at which time the patient complained of increased dyspnea with exertion, abdominal distention, orthopnea and some weight gain.   At the time she remained in sinus rhythm. No adjustments were made in her medications.  An event monitor was obtained documenting sinus rhythm without evidence of atrial fibrillation and occasional PVCs.  Her dose of metoprolol was increased in October and gabapentin was added for chronic sternal pain.  A follow-up echocardiogram was performed 08/27/2016. Left ventricular systolic function was reported normal with ejection fraction estimated 50-55%. There was intact mitral valve repair with mean transvalvular gradient across the valve estimated 12 mmHg. There was no significant mitral regurgitation appreciated.  The tricuspid valve repair remained intact with no significant residual regurgitation and mean transvalvular gradient estimated 7 mmHg.  The patient underwent  transesophageal echocardiogram on 10/18/2016. This revealed normal  left ventricular systolic function with ejection fraction estimated 55-60%. The mitral valve repair remained intact with mild residual mitral regurgitation. Mean transvalvular gradient across mitral valve was estimated 8 mmHg.  The tricuspid valve repair remained intact with mild residual tricuspid regurgitation and no significant stenosis. Mean gradient across the tricuspid valve was estimated 1 mmHg.  The patient was referred for surgical consultation.  The patient is single and lives alone in [place]. She previously worked as a Automotive engineer. She is a relatively poor historian. She has been obese most of her adult life. She describes a long history of exertional shortness of breath and orthopnea, and lower extremity edema dating back at least 10 years. Duration of her history of atrial fibrillation is unclear.  According to the patient she was not discharged from the hospital on oral anticoagulation following her surgery other than aspirin 325 mg daily. She states that her breathing was improved somewhat following her surgery but she has continued to have some exertional shortness breath, orthopnea, or lower extremity edema and she thinks that symptoms have progressed over the last year or so. She continues to have chronic pain in her mid chest related to her previous sternotomy. She denies any sensation of clicking or motion. She admits that she has gained some weight over the past 2 years. She is not active physically and all. She states that she weighs herself at home and occasionally takes an extra dose of Demadex but she does not like to do so because she states that this causes cramping in her lower legs. She reports occasional palpitations. She denies any exertional chest pain.   Past Medical History:  Diagnosis Date  . Congestive heart failure, unspecified    Diastolic heart failure  . Degeneration of lumbar or lumbosacral intervertebral disc   . Depression   . Dyspnea      chronic multifactorial large component of deconditioning  . Esophageal reflux   . Insomnia    Working third shift  . Mitral stenosis    Secondary to under-sized ring annuloplasty following mitral valve repair  . Mitral valve disorders(424.0)    Mild to moderate mitral regurgitation  . Mitral valve insufficiency and aortic valve insufficiency   . Morbid obesity ([place])   . Normocytic anemia   . Other chest pain   . Other dyspnea and respiratory abnormality   . Paroxysmal atrial fibrillation ([name])    Stable no recurrence  . Peripheral edema    chronic  . S/P Maze operation for atrial fibrillation 02/27/2015  . S/P mitral valve repair 02/27/2015   "radical repair" including resection of P2 segment of posterior leaflet with 26 mm Edwards Physio II annuloplasty ring  . S/P tricuspid valve repair 02/27/2015   26 mm annuloplasty ring  . Unspecified essential hypertension     Past Surgical History:  Procedure Laterality Date  . APPENDECTOMY    . BREAST BIOPSY Left 2009  . CARDIAC VALVE SURGERY  02/27/2015   [name]. Mitral Valve Repair. Radical Repair with resection of P2 Prolapse, 26 mm Physio II Ring Annuloplasty, Tricuspid Valve Repair (26 mm Ring Annuloplasty, Cox Maze IV Procedure on 02/27/2015  . CHOLECYSTECTOMY    . COLONOSCOPY  2011   Dr. Laural Golden: 56mm polyp from hepatic flexure (tubular adenoma)  . COLONOSCOPY N/A 11/10/2016   Procedure: COLONOSCOPY;  Surgeon: Daneil Dolin, [name];  Location: [facility];  Service: [name];  Laterality: N/A;  2:00 pm  .  ESOPHAGOGASTRODUODENOSCOPY  2005    Dr. Gala Romney : normal esophagus s/p dilation. Small hiatal hernia noted. Felt improved after dilation.   . ESOPHAGOGASTRODUODENOSCOPY N/A 11/10/2016   Procedure: ESOPHAGOGASTRODUODENOSCOPY (EGD);  Surgeon: Daneil Dolin, [name];  Location: [facility];  Service: [name];  Laterality: N/A;  . HEEL SPUR SURGERY Right   . MALONEY DILATION N/A 11/10/2016   Procedure: Venia Minks DILATION;  Surgeon: Daneil Dolin, [name];  Location: [facility];  Service: [name];  Laterality: N/A;  . MINIMALLY INVASIVE TRICUSPID VALVE REPAIR  02/27/2015   [place]  . MITRAL VALVE REPAIR  02/27/2015   [place]  . TEE WITHOUT CARDIOVERSION N/A 10/18/2016   Procedure: TRANSESOPHAGEAL ECHOCARDIOGRAM (TEE);  Surgeon: Arnoldo Lenis, [name];  Location: [facility];  Service: [name];  Laterality: N/A;  . TOTAL ABDOMINAL HYSTERECTOMY    . TOTAL KNEE ARTHROPLASTY Right   . TUBAL LIGATION      Family History  Problem Relation Age of Onset  . Hypertension Mother   . Heart disease Mother   . Kidney disease Father   . Stroke Father   . Diabetes Sister   . Kidney disease Sister   . Heart disease Sister   . Hypertension Sister   . Heart disease Brother   . Heart attack Son   . Hypertension Son   . Cancer Paternal Grandmother     Breast Cancer  . Heart disease Brother   . Colon cancer Neg Hx     Social History   Social History  . Marital status: Single    Spouse name: N/A  . Number of children: N/A  . Years of education: N/A   Occupational History  . Not on file.   Social History Main Topics  . Smoking status: Former Smoker    Packs/day: 0.50    Years: 25.00    Types: Cigarettes    Quit date: 1997  . Smokeless tobacco: Former Systems developer    Types: [place] date: 1975     Comment: dipped tobacco during pregancy in 1975  . Alcohol use No  . Drug use: No  . Sexual activity: Yes    Partners: Male   Other Topics Concern  . Not on file   Social History Narrative  . No narrative on file    Current Outpatient Prescriptions  Medication Sig Dispense Refill  . aspirin 325 MG EC tablet Take 325 mg by mouth daily.    Marland Kitchen dimenhyDRINATE (DRAMAMINE) 50 MG tablet Take 50 mg by mouth at bedtime as needed (to help her sleep).    . diphenhydrAMINE (BENADRYL) 25 MG tablet Take 25 mg by mouth at bedtime as needed for allergies or sleep.     Marland Kitchen  FLUoxetine (PROZAC) 20 MG capsule Take 1 capsule (20 mg total) by mouth daily. 90 capsule 1  . gabapentin (NEURONTIN) 300 MG capsule TAKE 300MG  1 TAB IN AM AND 600MG  2 TABS PM 270 capsule 1  . ibuprofen (ADVIL,MOTRIN) 200 MG tablet Take 800 mg by mouth every 8 (eight) hours as needed for mild pain. Alternates with Norco if needed    . indomethacin (INDOCIN) 25 MG capsule 1 po q 8 hour prn gout pain 60 capsule 1  . linaclotide (LINZESS) 72 MCG capsule Take 72 mcg by mouth daily before breakfast.    . Liniments (SALONPAS PAIN RELIEF PATCH EX) Apply 1 patch topically daily as needed (pain).    Marland Kitchen lisinopril (PRINIVIL,ZESTRIL) 10  MG tablet Take 1 tablet (10 mg total) by mouth daily. 90 tablet 1  . metoprolol (LOPRESSOR) 100 MG tablet Take 1 tablet (100 mg total) by mouth 2 (two) times daily. 180 tablet 1  . metoprolol tartrate (LOPRESSOR) 25 MG tablet Take 1 tablet (25 mg total) by mouth 2 (two) times daily. 180 tablet 3  . omeprazole (PRILOSEC) 40 MG capsule Take 1 capsule (40 mg total) by mouth daily. 90 capsule 1  . Potassium 99 MG TABS Take 99 mg by mouth daily.    Marland Kitchen torsemide (DEMADEX) 20 MG tablet Take 1 tablet (20 mg total) by mouth daily. 30 tablet 2  . magnesium gluconate (MAGONATE) 500 MG tablet Take 500 mg by mouth daily.     No current facility-administered medications for this visit.     Allergies  Allergen Reactions  . Iodinated Diagnostic Agents Swelling    EYES      Review of Systems:   General:  no appetite, decreased energy, + weight gain, no weight loss, no fever  Cardiac:  no chest pain with exertion, + chest pain at rest, + SOB with exertion, occasional resting SOB, no PND, + longstanding 2-3 pillow orthopnea, no palpitations, no arrhythmia, + atrial fibrillation, + LE edema, no dizzy spells, no syncope  Respiratory:  + chronic shortness of breath, no home oxygen, no productive cough, no dry cough, no bronchitis, no wheezing, no hemoptysis, no asthma, no pain with  inspiration or cough, no sleep apnea, no CPAP at night  GI:   no difficulty swallowing, no reflux, no frequent heartburn, + hiatal hernia, no abdominal pain, no constipation, no diarrhea, no hematochezia, no hematemesis, no melena  GU:   no dysuria,  no frequency, no urinary tract infection, no hematuria, no kidney stones, no kidney disease  Vascular:  + pain suggestive of claudication, + pain in feet, + leg cramps, no varicose veins, no DVT, no non-healing foot ulcer  Neuro:   no stroke, no TIA's, no seizures, no headaches, no temporary blindness one eye,  no slurred speech, + peripheral neuropathy, + chronic pain, no instability of gait, + memory/cognitive dysfunction  Musculoskeletal: + arthritis, + joint swelling, no myalgias, + difficulty walking, decreased mobility   Skin:   no rash, no itching, no skin infections, no pressure sores or ulcerations  Psych:   no anxiety, + depression, no nervousness, + unusual recent stress  Eyes:   no blurry vision, + floaters, no recent vision changes, + wears glasses or contacts  ENT:   no hearing loss, no loose or painful teeth, no dentures, last saw dentist 12/2014  Hematologic:  + easy bruising, no abnormal bleeding, no clotting disorder, no frequent epistaxis  Endocrine:  no diabetes, does not check CBG's at home     Physical Exam:   BP 119/62 (BP Location: Left Arm, Patient Position: Sitting, Cuff Size: Large)   Pulse 66   Resp 16   Ht 5\' 7"  (1.702 m)   Wt 240 lb (108.9 kg)   LMP  (LMP Unknown)   SpO2 92% Comment: ON RA  BMI 37.59 kg/m   General:  Obese, o/w  well-appearing  HEENT:  Unremarkable   Neck:   no JVD, no bruits, no adenopathy   Chest:   clear to auscultation, symmetrical breath sounds, no wheezes, no rhonchi   CV:   RRR, no murmur   Abdomen:  soft, non-tender, no masses   Extremities:  warm, well-perfused, pulses diminished, mild LE edema  Rectal/GU  Deferred  Neuro:   Grossly non-focal and symmetrical  throughout  Skin:   Clean and dry, no rashes, no breakdown   Diagnostic Tests:  Transthoracic Echocardiography  Patient:    Toni [name], Toni [name] MR #:       [id number] Study Date: 08/27/2016 Gender:     F Age:        47 Height:     172.7 cm Weight:     107 kg BSA:        2.31 m^2 Pt. Status: Room:   SONOGRAPHER  Missy Church, RVT, RDCS  ATTENDING    Kerry Hough, M.D.  Berna Spare, M.D.  REFERRING    Kerry Hough, M.D.  PERFORMING   [facility] [place]  cc:  ------------------------------------------------------------------- LV EF: 50% -   55%  ------------------------------------------------------------------- History:   PMH:  Acquired from the patient and from the patient&'s chart.  Dyspnea.  Atrial fibrillation.  Risk factors:  Former tobacco use. Hypertension.  ------------------------------------------------------------------- Study Conclusions  - Left ventricle: The cavity size was normal. Wall thickness was   increased in a pattern of mild LVH. Systolic function was normal.   The estimated ejection fraction was in the range of 50% to 55%.   Wall motion was normal; there were no regional wall motion   abnormalities. The study was not technically sufficient to allow   evaluation of LV diastolic dysfunction due to atrial   fibrillation. - Aortic valve: Mildly calcified annulus. Trileaflet; mildly   thickened leaflets. There was mild regurgitation. Valve area   (VTI): 2.47 cm^2. Valve area (Vmax): 2.47 cm^2. Valve area   (Vmean): 2.43 cm^2. - Mitral valve: The is a MV anular ring present s/p mitral valve   repair. There is an increased gradient across the MV. Mean   gradient 12 mmHg. - Left atrium: The atrium was severely dilated. - Right ventricle: The cavity size was mildly dilated. Systolic   function was mildly to moderately reduced. - Right atrium: The atrium was mildly dilated. - Tricuspid valve: A TV anular ring is present. Mean  gradient   across the TV of 7 mmHg. - Pulmonary arteries: Systolic pressure was mildly increased. PA   peak pressure: 31 mm Hg (S). - Technically adequate study.  ------------------------------------------------------------------- Labs, prior tests, procedures, and surgery: Patient is s/p radical repair with resection of P2 MVP, 26 mm Physio II Ring Annuloplasty, tricuspid valve repair with 26 mm Ring Annuloplasty, Cox Maze IV procedure in 2016.  Coronary artery bypass grafting.  ------------------------------------------------------------------- Study data:  The previous study was not available, so comparison was made to the report of April 2016. Post-op limited echo showed severely reduced EF (<25%)  Study status:  Routine.  Procedure: Transthoracic echocardiography. Image quality was adequate.  Study completion:  There were no complications.          Transthoracic echocardiography.  M-mode, complete 2D, spectral Doppler, and color Doppler.  Birthdate:  Patient birthdate: 09-Aug-1956.  Age:  Patient is 61 yr old.  Sex:  Gender: female.    BMI: 35.9 kg/m^2.  Blood pressure:     130/80  Patient status:  Outpatient.  Study date: Study date: 08/27/2016. Study time: 08:20 AM.  -------------------------------------------------------------------  ------------------------------------------------------------------- Left ventricle:  The cavity size was normal. Wall thickness was increased in a pattern of mild LVH. Systolic function was normal. The estimated ejection fraction was in the range of 50% to 55%. Wall motion was normal; there were no regional wall motion abnormalities. The  study was not technically sufficient to allow evaluation of LV diastolic dysfunction due to atrial fibrillation.   ------------------------------------------------------------------- Aortic valve:   Mildly calcified annulus. Trileaflet; mildly thickened leaflets.  Doppler:   There was no stenosis.   There  was mild regurgitation.    VTI ratio of LVOT to aortic valve: 0.87. Valve area (VTI): 2.47 cm^2. Indexed valve area (VTI): 1.07 cm^2/m^2. Peak velocity ratio of LVOT to aortic valve: 0.87. Valve area (Vmax): 2.47 cm^2. Indexed valve area (Vmax): 1.07 cm^2/m^2. Mean velocity ratio of LVOT to aortic valve: 0.86. Valve area (Vmean): 2.43 cm^2. Indexed valve area (Vmean): 1.05 cm^2/m^2. Mean gradient (S): 3 mm Hg. Peak gradient (S): 5 mm Hg.  ------------------------------------------------------------------- Aorta:  Aortic root: The aortic root was normal in size.  ------------------------------------------------------------------- Mitral valve:  The is a MV anular ring present s/p mitral valve repair.  Moderately thickened leaflets .  Doppler:  There was no significant regurgitation. There is an increased gradient across the MV. Mean gradient 12 mmHg.    Mean gradient (D): 11 mm Hg. Peak gradient (D): 18 mm Hg.  ------------------------------------------------------------------- Left atrium:  The atrium was severely dilated.  ------------------------------------------------------------------- Atrial septum:  Poorly visualized.  ------------------------------------------------------------------- Right ventricle:  The cavity size was mildly dilated. Systolic function was mildly to moderately reduced.  ------------------------------------------------------------------- Pulmonic valve:   Not well visualized.  Doppler:   There was no evidence for stenosis.   There was mild to moderate regurgitation.   ------------------------------------------------------------------- Tricuspid valve:  A TV anular ring is present.  Doppler:  There was no significant regurgitation. Mean gradient across the TV of 7 mmHg.    Mean gradient (D): 7 mm Hg.  ------------------------------------------------------------------- Pulmonary artery:   Systolic pressure was mildly  increased.  ------------------------------------------------------------------- Right atrium:  The atrium was mildly dilated.  ------------------------------------------------------------------- Pericardium:  There was no pericardial effusion.  ------------------------------------------------------------------- Systemic veins: Inferior vena cava: The vessel was normal in size. The respirophasic diameter changes were in the normal range (>= 50%), consistent with normal central venous pressure.  ------------------------------------------------------------------- Measurements   Left ventricle                           Value           Reference  LV ID, ED, PLAX chordal                  50     mm       43 - 52  LV ID, ES, PLAX chordal                  38     mm       23 - 38  LV fx shortening, PLAX chordal   (L)     24     %        >=29  LV PW thickness, ED                      11.3   mm       ---------  IVS/LV PW ratio, ED                      0.97            <=1.3  LV ejection time  310    ms       ---------    Ventricular septum                       Value           Reference  IVS thickness, ED                        11     mm       ---------    LVOT                                     Value           Reference  LVOT ID, S                               19     mm       ---------  LVOT area                                2.84   cm^2     ---------  LVOT peak velocity, S                    97.9   cm/s     ---------  LVOT mean velocity, S                    67.5   cm/s     ---------  LVOT VTI, S                              21.6   cm       ---------    Aortic valve                             Value           Reference  Aortic valve peak velocity, S            112.17 cm/s     ---------  Aortic valve mean velocity, S            78.74  cm/s     ---------  Aortic valve VTI, S                      24.95  cm       ---------  Aortic mean gradient, S                   3      mm Hg    ---------  Aortic peak gradient, S                  5      mm Hg    ---------  VTI ratio, LVOT/AV                       0.87            ---------  Aortic valve area, VTI  2.47   cm^2     ---------  Aortic valve area/bsa, VTI               1.07   cm^2/m^2 ---------  Velocity ratio, peak, LVOT/AV            0.87            ---------  Aortic valve area, peak velocity         2.47   cm^2     ---------  Aortic valve area/bsa, peak              1.07   cm^2/m^2 ---------  velocity  Velocity ratio, mean, LVOT/AV            0.86            ---------  Aortic valve area, mean velocity         2.43   cm^2     ---------  Aortic valve area/bsa, mean              1.05   cm^2/m^2 ---------  velocity    Aorta                                    Value           Reference  Aortic root ID, ED                       32     mm       ---------    Left atrium                              Value           Reference  LA ID, A-P, ES                           53     mm       ---------  LA ID/bsa, A-P                   (H)     2.3    cm/m^2   <=2.2  LA volume, ES, 1-p A4C                   155    ml       ---------  LA volume/bsa, ES, 1-p A4C               67.1   ml/m^2   ---------    Mitral valve                             Value           Reference  Mitral E-wave peak velocity              215    cm/s     ---------  Mitral A-wave peak velocity              71.7   cm/s     ---------  Mitral mean velocity, D                  147    cm/s     ---------  Mitral  deceleration time         (H)     665    ms       150 - 230  Mitral mean gradient, D                  11     mm Hg    ---------  Mitral peak gradient, D                  18     mm Hg    ---------    Pulmonary arteries                       Value           Reference  PA pressure, S, DP               (H)     31     mm Hg    <=30    Tricuspid valve                          Value           Reference  Tricuspid mean velocity, D                124.99 cm/s     ---------  Tricuspid mean gradient, D               7      mm Hg    ---------  Tricuspid VTI at annulus, D              739.4  mm       ---------  Tricuspid regurg peak velocity           271    cm/s     ---------  Tricuspid peak RV-RA gradient            29     mm Hg    ---------    Right ventricle                          Value           Reference  TAPSE                                    13.3   mm       ---------    Pulmonic valve                           Value           Reference  Pulmonic valve peak velocity, S          112    cm/s     ---------  Legend: (L)  and  (H)  mark values outside specified reference range.  ------------------------------------------------------------------- Prepared and Electronically Authenticated by  Kerry Hough, M.D. 2017-10-20T12:37:54   Transesophageal Echocardiography  Patient:    [name], Toni [name] MR #:       [id number] Study Date: 10/18/2016 Gender:     F Age:        73 Height:     170.2 cm Weight:     107.3 kg BSA:        2.3 m^2 Pt. Status: Room:  ADMITTING    Kerry Hough, M.D.  ATTENDING    Kerry Hough, M.D.  Berna Spare, M.D.  REFERRING    Kerry Hough, M.D.  PERFORMING   [facility], Forestine Na  SONOGRAPHER  Alvino Chapel, RCS  cc:  ------------------------------------------------------------------- LV EF: 55% -   60%  ------------------------------------------------------------------- Indications:      Mitral stenosis [non-rheumatic] 424.0.  ------------------------------------------------------------------- History:   PMH:   Atrial fibrillation.  PMH:  Chronic Diastolic Heart Failure, SOB, Edema.  Risk factors:  Hypertension.  ------------------------------------------------------------------- Study Conclusions  - Left ventricle: The cavity size was normal. Wall thickness was   normal. Systolic function was normal. The estimated ejection   fraction  was in the range of 55% to 60%. - Mitral valve: The posterior leaflet is moderately thickened with   severely restricted motion. There is a moderate to severe   gradient across the MV of 8 mmHg. There was mild regurgitation.   Mean gradient (D): 8 mm Hg. - Left atrium: The atrium was severely dilated. - Right atrium: No evidence of thrombus in the atrial cavity or   appendage.  ------------------------------------------------------------------- Study data:   Study status:  Routine.  Procedure:  The patient reported no pain pre or post test. Initial setup. The patient was brought to the laboratory. Surface ECG leads were monitored. Sedation. Conscious sedation was administered. Transesophageal echocardiography. Topical anesthesia was obtained using viscous lidocaine. A transesophageal probe was inserted by the attending cardiologist. Image quality was adequate.  Study completion:  The patient tolerated the procedure well. There were no complications.         Diagnostic transesophageal echocardiography.  2D and color Doppler.  Birthdate:  Patient birthdate: [birth year].  Age:  Patient is 61 yr old.  Sex:  Gender: female.    BMI: 37 kg/m^2.  Blood pressure:     141/72  Patient status:  Outpatient.  Study date: Study date: 10/18/2016. Study time: 10:14 AM.  Location: [name].  -------------------------------------------------------------------  ------------------------------------------------------------------- Left ventricle:  The cavity size was normal. Wall thickness was normal. Systolic function was normal. The estimated ejection fraction was in the range of 55% to 60%.  ------------------------------------------------------------------- Aortic valve:   Trileaflet; normal thickness leaflets.  Doppler: There was no stenosis.   There was no significant regurgitation.   ------------------------------------------------------------------- Aorta:  Aortic root: The aortic root was  normal in size. Mild diffuse plaque throughout the visualized portions of the descending aorta.  ------------------------------------------------------------------- Mitral valve:  A 26 mm physio II ring is present at the MV anulus. Mildly thickened leaflets . The posterior leaflet is moderately thickened with severely restricted motion. There is a moderate to severe gradient across the MV of 8 mmHg.  Doppler:  There was mild regurgitation.    Mean gradient (D): 8 mm Hg.  ------------------------------------------------------------------- Left atrium:  The atrium was severely dilated. No left atrial appendage is visualized.  ------------------------------------------------------------------- Atrial septum:  No evidence of PFO or ASD.  ------------------------------------------------------------------- Right ventricle:  The cavity size was normal.  ------------------------------------------------------------------- Pulmonic valve:   Not well visualized.  Doppler:   There was no evidence for stenosis.   There was no significant regurgitation.   ------------------------------------------------------------------- Tricuspid valve:  A 26 mm ring is present in at the TV anulus. Normal thickness leaflets.  Doppler:   There was no evidence for stenosis.   There was mild regurgitation.    Mean gradient (D): 1 mm Hg.  ------------------------------------------------------------------- Pulmonary artery:   PASP is at least 55 mmHg,  moderately elevated.   ------------------------------------------------------------------- Right atrium:  The atrium was normal in size.  No evidence of thrombus in the atrial cavity or appendage.  ------------------------------------------------------------------- Pericardium:  There was no pericardial effusion.  ------------------------------------------------------------------- Measurements   Left ventricle                    Value  LV filling  time, D, DP            580    ms    Aorta                             Value  Aortic root ID                    25     mm    Mitral valve                      Value  Mitral mean velocity, D           128.81 cm/s  Mitral mean gradient, D           8      mm Hg    Tricuspid valve                   Value  Tricuspid mean velocity, D        42.53  cm/s  Tricuspid mean gradient, D        1      mm Hg  Tricuspid VTI at annulus, D       199.2  mm  Legend: (L)  and  (H)  mark values outside specified reference range.  ------------------------------------------------------------------- Prepared and Electronically Authenticated by  Kerry Hough, M.D. 2017-12-11T13:09:57     Impression:  Patient has long-standing symptoms of exertional shortness of breath, orthopnea, and lower extremity edema consistent with chronic diastolic congestive heart failure. According to the patient she has been having similar symptoms for more than 10 years. Symptoms are unquestionably multifactorial and related to her underlying valvular heart disease, hypertensive heart disease, morbid obesity, and atrial fibrillation. She apparently has remained in sinus rhythm since her mitral valve repair, tricuspid valve repair, and Maze procedure nearly 2 years ago. Patient states that symptoms of exertional shortness of breath and orthopnea initially improved following surgery but have gotten worse recently.   I have personally reviewed her recent transesophageal echocardiogram. The patient's previous mitral valve repair remains essentially intact with mild residual mitral regurgitation. There is an undersized annuloplasty ring (26 mm Edwards Physio II) with some degree of functional mitral stenosis (patient-prosthesis mismatch). However, the anterior leaflet of the mitral valve moved normally. There is nothing to suggest that her valve function is any different now than it was immediately following her surgery 2 years ago.  Transvalvular gradient across the mitral valve will likely wax and wane in severity and be affected by the patient's cardiac output. It may particular be exacerbated given the presence of significant anemia.  Left ventricular systolic function remains normal. The tricuspid valve repair remains intact.    I would not recommend redo mitral valve repair or replacement under the circumstances. Risks associated with redo surgery would be relatively high and in all likelihood her valve would need to be replaced.  There would be no potential survival benefit associated with redo surgery, and risks would be considerable. Moreover, it would be difficult if not  impossible to predict whether or not the patient might actually enjoy any significant symptomatic improvement if her valve were to be replaced at this time.    Plan:  I recommend continued adjustment in the patient's long-term medical therapy for chronic diastolic congestive heart failure to achieve symptomatic improvement. Cutting back on her relatively high dose of beta blockers, addition of an ACE inhibitor, and escalating doses of diuretics might be worth trying. The patient has been advised that she should continue to keep a journal of her daily weight and how often she takes extra doses of her diuretic. She has also been advised how important it will be for her to find a way to become more physically active and lose weight.  The patient will continue to follow-up with [name]. If symptoms get worse it might be reasonable to consider right heart catheterization with transseptal puncture to directly assess the gradient across the mitral valve.  Patient will call and return to the our office in the future as desired.   I spent in excess of 90 minutes during the conduct of this office consultation and >50% of this time involved direct face-to-face encounter with the patient for counseling and/or coordination of their care.    Valentina Gu. Roxy Manns,  [name] 12/16/2016 3:41 PM

## 2016-12-17 ENCOUNTER — Encounter: Payer: Self-pay | Admitting: Thoracic Surgery (Cardiothoracic Vascular Surgery)

## 2016-12-23 ENCOUNTER — Encounter: Payer: Self-pay | Admitting: Physician Assistant

## 2016-12-23 ENCOUNTER — Ambulatory Visit: Payer: Self-pay | Admitting: Physician Assistant

## 2016-12-23 VITALS — BP 130/78 | HR 74 | Temp 97.5°F | Ht 68.0 in | Wt 243.8 lb

## 2016-12-23 DIAGNOSIS — E669 Obesity, unspecified: Secondary | ICD-10-CM

## 2016-12-23 DIAGNOSIS — I10 Essential (primary) hypertension: Secondary | ICD-10-CM

## 2016-12-23 DIAGNOSIS — M545 Low back pain: Secondary | ICD-10-CM

## 2016-12-23 DIAGNOSIS — Z6837 Body mass index (BMI) 37.0-37.9, adult: Secondary | ICD-10-CM

## 2016-12-23 DIAGNOSIS — K5900 Constipation, unspecified: Secondary | ICD-10-CM

## 2016-12-23 DIAGNOSIS — G8929 Other chronic pain: Secondary | ICD-10-CM

## 2016-12-23 DIAGNOSIS — N189 Chronic kidney disease, unspecified: Secondary | ICD-10-CM

## 2016-12-23 DIAGNOSIS — I38 Endocarditis, valve unspecified: Secondary | ICD-10-CM

## 2016-12-23 NOTE — Progress Notes (Signed)
BP 130/78 (BP Location: Left Arm, Patient Position: Sitting, Cuff Size: Normal)   Pulse 74   Temp 97.5 F (36.4 C)   Ht 5\' 8"  (1.727 m)   Wt 243 lb 12 oz (110.6 kg)   LMP  (LMP Unknown)   SpO2 96%   BMI 37.06 kg/m    Subjective:    Patient ID: [name], female    DOB: 20-May-1956, 61 y.o.   MRN: [id number]  HPI: [name] is a 61 y.o. female presenting on 12/23/2016 for Follow-up   HPI   pt saw heart surgeon recently and she is not going to have any surgery for her heart valve at this time. Pt says she is doing mostly well.  She says she gets some DOE but not too bad.  Relevant past medical, surgical, family and social history reviewed and updated as indicated. Interim medical history since our last visit reviewed. Allergies and medications reviewed and updated.   Current Outpatient Prescriptions:  .  aspirin 325 MG EC tablet, Take 325 mg by mouth daily., Disp: , Rfl:  .  dimenhyDRINATE (DRAMAMINE) 50 MG tablet, Take 50 mg by mouth at bedtime as needed (to help her sleep)., Disp: , Rfl:  .  diphenhydrAMINE (BENADRYL) 25 MG tablet, Take 25 mg by mouth at bedtime as needed for allergies or sleep. , Disp: , Rfl:  .  FLUoxetine (PROZAC) 20 MG capsule, Take 1 capsule (20 mg total) by mouth daily., Disp: 90 capsule, Rfl: 1 .  gabapentin (NEURONTIN) 300 MG capsule, TAKE 300MG  1 TAB IN AM AND 600MG  2 TABS PM, Disp: 270 capsule, Rfl: 1 .  ibuprofen (ADVIL,MOTRIN) 200 MG tablet, Take 800 mg by mouth every 8 (eight) hours as needed for mild pain. Alternates with Norco if needed, Disp: , Rfl:  .  indomethacin (INDOCIN) 25 MG capsule, 1 po q 8 hour prn gout pain, Disp: 60 capsule, Rfl: 1 .  linaclotide (LINZESS) 72 MCG capsule, Take 72 mcg by mouth daily before breakfast., Disp: , Rfl:  .  Liniments (SALONPAS PAIN RELIEF PATCH EX), Apply 1 patch topically daily as needed (pain)., Disp: , Rfl:  .  lisinopril (PRINIVIL,ZESTRIL) 10 MG tablet, Take 1 tablet (10 mg total) by mouth  daily., Disp: 90 tablet, Rfl: 1 .  magnesium gluconate (MAGONATE) 500 MG tablet, Take 500 mg by mouth daily., Disp: , Rfl:  .  metoprolol (LOPRESSOR) 100 MG tablet, Take 1 tablet (100 mg total) by mouth 2 (two) times daily., Disp: 180 tablet, Rfl: 1 .  metoprolol tartrate (LOPRESSOR) 25 MG tablet, Take 1 tablet (25 mg total) by mouth 2 (two) times daily., Disp: 180 tablet, Rfl: 3 .  omeprazole (PRILOSEC) 40 MG capsule, Take 1 capsule (40 mg total) by mouth daily., Disp: 90 capsule, Rfl: 1 .  Potassium 99 MG TABS, Take 99 mg by mouth daily., Disp: , Rfl:  .  torsemide (DEMADEX) 20 MG tablet, Take 1 tablet (20 mg total) by mouth daily., Disp: 30 tablet, Rfl: 2  Review of Systems  Constitutional: Positive for fatigue. Negative for appetite change, chills, diaphoresis, fever and unexpected weight change.  HENT: Positive for congestion and dental problem. Negative for drooling, ear pain, facial swelling, hearing loss, mouth sores, sneezing, sore throat, trouble swallowing and voice change.   Eyes: Positive for redness and itching. Negative for pain, discharge and visual disturbance.  Respiratory: Positive for shortness of breath. Negative for cough, choking and wheezing.   Cardiovascular: Positive for palpitations. Negative  for chest pain and leg swelling.  Gastrointestinal: Positive for constipation. Negative for abdominal pain, blood in stool, diarrhea and vomiting.  Endocrine: Negative for cold intolerance, heat intolerance and polydipsia.  Genitourinary: Positive for decreased urine volume. Negative for dysuria and hematuria.  Musculoskeletal: Positive for arthralgias and back pain. Negative for gait problem.  Skin: Negative for rash.  Allergic/Immunologic: Negative for environmental allergies.  Neurological: Negative for seizures, syncope, light-headedness and headaches.  Hematological: Negative for adenopathy.  Psychiatric/Behavioral: Positive for dysphoric mood. Negative for agitation and  suicidal ideas. The patient is not nervous/anxious.     Per HPI unless specifically indicated above     Objective:    BP 130/78 (BP Location: Left Arm, Patient Position: Sitting, Cuff Size: Normal)   Pulse 74   Temp 97.5 F (36.4 C)   Ht 5\' 8"  (1.727 m)   Wt 243 lb 12 oz (110.6 kg)   LMP  (LMP Unknown)   SpO2 96%   BMI 37.06 kg/m   Wt Readings from Last 3 Encounters:  12/23/16 243 lb 12 oz (110.6 kg)  12/16/16 240 lb (108.9 kg)  12/01/16 242 lb (109.8 kg)    Physical Exam  Constitutional: She is oriented to person, place, and time. She appears well-developed and well-nourished.  HENT:  Head: Normocephalic and atraumatic.  Neck: Neck supple.  Cardiovascular: Normal rate and regular rhythm.   Pulmonary/Chest: Effort normal and breath sounds normal.  Abdominal: Soft. Bowel sounds are normal. She exhibits no mass. There is no hepatosplenomegaly. There is no tenderness.  Musculoskeletal: She exhibits no edema.  Lymphadenopathy:    She has no cervical adenopathy.  Neurological: She is alert and oriented to person, place, and time.  Skin: Skin is warm and dry.  Psychiatric: She has a normal mood and affect. Her behavior is normal.  Vitals reviewed.   Results for orders placed or performed in visit on 0000000  Basic Metabolic Panel (BMET)  Result Value Ref Range   Sodium 141 135 - 146 mmol/L   Potassium 4.3 3.5 - 5.3 mmol/L   Chloride 107 98 - 110 mmol/L   CO2 24 20 - 31 mmol/L   Glucose, Bld 90 65 - 99 mg/dL   BUN 17 7 - 25 mg/dL   Creat 1.04 (H) 0.50 - 0.99 mg/dL   Calcium 9.5 8.6 - 10.4 mg/dL  Magnesium  Result Value Ref Range   Magnesium 2.2 1.5 - 2.5 mg/dL      Assessment & Plan:    Encounter Diagnoses  Name Primary?  . Hypertension, unspecified type Yes  . Chronic kidney disease, unspecified CKD stage   . Chronic low back pain, unspecified back pain laterality, with sciatica presence unspecified   . Valvular heart disease   . Constipation, unspecified  constipation type   . Class 2 obesity with body mass index (BMI) of 37.0 to 37.9 in adult, unspecified obesity type, unspecified whether serious comorbidity present     -No changes in medications today -kidney function looking improved with bmp from january -pt is Encouraged gentle exercise and weight control to help chronic back pain -Gave pt assistance form for linzess for her to take to GI -pt to follow up with cardiology in April as scheduled -follow up here in 3 months.  RTO sooner prn

## 2017-01-10 ENCOUNTER — Other Ambulatory Visit: Payer: Self-pay | Admitting: *Deleted

## 2017-01-10 MED ORDER — GABAPENTIN 300 MG PO CAPS: ORAL_CAPSULE | ORAL | 0 refills | Status: DC

## 2017-01-24 ENCOUNTER — Ambulatory Visit: Payer: Self-pay | Admitting: Physician Assistant

## 2017-01-24 ENCOUNTER — Encounter: Payer: Self-pay | Admitting: Physician Assistant

## 2017-01-24 VITALS — BP 132/70 | HR 63 | Temp 97.5°F | Resp 16 | Ht 68.0 in | Wt 235.5 lb

## 2017-01-24 DIAGNOSIS — R112 Nausea with vomiting, unspecified: Secondary | ICD-10-CM

## 2017-01-24 DIAGNOSIS — J019 Acute sinusitis, unspecified: Secondary | ICD-10-CM

## 2017-01-24 MED ORDER — BENZONATATE 100 MG PO CAPS: ORAL_CAPSULE | ORAL | 3 refills | Status: DC

## 2017-01-24 MED ORDER — AMOXICILLIN 500 MG PO CAPS: 500.0000 mg | ORAL_CAPSULE | Freq: Three times a day (TID) | ORAL | 0 refills | Status: DC

## 2017-01-24 MED ORDER — PROMETHAZINE HCL 25 MG PO TABS: ORAL_TABLET | ORAL | 0 refills | Status: DC

## 2017-01-24 NOTE — Patient Instructions (Signed)
Nausea and Vomiting, Adult Nausea is the feeling that you have an upset stomach or have to vomit. As nausea gets worse, it can lead to vomiting. Vomiting occurs when stomach contents are thrown up and out of the mouth. Vomiting can make you feel weak and cause you to become dehydrated. Dehydration can make you tired and thirsty, cause you to have a dry mouth, and decrease how often you urinate. Older adults and people with other diseases or a weak immune system are at higher risk for dehydration. It is important to treat your nausea and vomiting as told by your health care provider. Follow these instructions at home: Follow instructions from your health care provider about how to care for yourself at home. Eating and drinking Follow these recommendations as told by your health care provider:  Take an oral rehydration solution (ORS). This is a drink that is sold at pharmacies and retail stores.  Drink clear fluids in small amounts as you are able. Clear fluids include water, ice chips, diluted fruit juice, and low-calorie sports drinks.  Eat bland, easy-to-digest foods in small amounts as you are able. These foods include bananas, applesauce, rice, lean meats, toast, and crackers.  Avoid fluids that contain a lot of sugar or caffeine, such as energy drinks, sports drinks, and soda.  Avoid alcohol.  Avoid spicy or fatty foods.  General instructions  Drink enough fluid to keep your urine clear or pale yellow.  Wash your hands often. If soap and water are not available, use hand sanitizer.  Make sure that all people in your household wash their hands well and often.  Take over-the-counter and prescription medicines only as told by your health care provider.  Rest at home while you recover.  Watch your condition for any changes.  Breathe slowly and deeply when you feel nauseated.  Keep all follow-up visits as told by your health care provider. This is important. Contact a health care  provider if:  You have a fever.  You cannot keep fluids down.  Your symptoms get worse.  You have new symptoms.  Your nausea does not go away after two days.  You feel light-headed or dizzy.  You have a headache.  You have muscle cramps. Get help right away if:  You have pain in your chest, neck, arm, or jaw.  You feel extremely weak or you faint.  You have persistent vomiting.  You see blood in your vomit.  Your vomit looks like black coffee grounds.  You have bloody or black stools or stools that look like tar.  You have a severe headache, a stiff neck, or both.  You have a rash.  You have severe pain, cramping, or bloating in your abdomen.  You have trouble breathing or you are breathing very quickly.  Your heart is beating very quickly.  Your skin feels cold and clammy.  You feel confused.  You have pain when you urinate.  You have signs of dehydration, such as: ? Dark urine, very little urine, or no urine. ? Cracked lips. ? Dry mouth. ? Sunken eyes. ? Sleepiness. ? Weakness. These symptoms may represent a serious problem that is an emergency. Do not wait to see if the symptoms will go away. Get medical help right away. Call your local emergency services (911 in the [place] Do not drive yourself to the hospital. This information is not intended to replace advice given to you by your health care provider. Make sure you discuss any questions you   have with your health care provider. Document Released: 10/25/2005 Document Revised: 03/29/2016 Document Reviewed: 07/01/2015 Elsevier Interactive Patient Education  2017 [employer]  

## 2017-01-24 NOTE — Progress Notes (Signed)
BP 132/70 (BP Location: Left Arm, Patient Position: Sitting, Cuff Size: Normal)   Pulse 63   Temp 97.5 F (36.4 C)   Resp 16   Ht 5\' 8"  (1.727 m)   Wt 235 lb 8 oz (106.8 kg)   LMP  (LMP Unknown)   SpO2 94%   BMI 35.81 kg/m    Subjective:    Patient ID: [name], female    DOB: 1955/11/15, 61 y.o.   MRN: [id number]  HPI: [name] is a 61 y.o. female presenting on 01/24/2017 for Headache (c/o HA to top of head going down to sinuses for about a week,); Cough (c/o cough, sinus congestion, blows green thick mucus out of nose with some blood in it); and Nausea (and vomiting since Saturday, yesterday, states had temp of 101.9 yesterday, unable to eat, took fluid pill this AM and has not yet urinated)   HPI   Chief Complaint  Patient presents with  . Headache    c/o HA to top of head going down to sinuses for about a week,  . Cough    c/o cough, sinus congestion, blows green thick mucus out of nose with some blood in it  . Nausea    and vomiting since Saturday, yesterday, states had temp of 101.9 yesterday, unable to eat, took fluid pill this AM and has not yet urinated     Pt says she kept her pills down today/she did not throw them up.   Relevant past medical, surgical, family and social history reviewed and updated as indicated. Interim medical history since our last visit reviewed. Allergies and medications reviewed and updated.  Review of Systems  Constitutional: Positive for appetite change, chills, diaphoresis, fatigue, fever and unexpected weight change.  HENT: Positive for congestion, dental problem and sneezing. Negative for drooling, ear pain, facial swelling, hearing loss, mouth sores, sore throat, trouble swallowing and voice change.   Eyes: Positive for discharge and redness. Negative for pain, itching and visual disturbance.  Respiratory: Positive for cough, shortness of breath and wheezing. Negative for choking.   Cardiovascular: Negative for chest  pain, palpitations and leg swelling.  Gastrointestinal: Positive for abdominal pain and vomiting. Negative for blood in stool, constipation and diarrhea.  Endocrine: Positive for cold intolerance and polydipsia. Negative for heat intolerance.  Genitourinary: Positive for decreased urine volume. Negative for dysuria and hematuria.  Musculoskeletal: Positive for arthralgias, back pain and gait problem.  Skin: Negative for rash.  Allergic/Immunologic: Negative for environmental allergies.  Neurological: Positive for headaches. Negative for seizures, syncope and light-headedness.  Hematological: Negative for adenopathy.  Psychiatric/Behavioral: Negative for agitation, dysphoric mood and suicidal ideas. The patient is not nervous/anxious.     Per HPI unless specifically indicated above     Objective:    BP 132/70 (BP Location: Left Arm, Patient Position: Sitting, Cuff Size: Normal)   Pulse 63   Temp 97.5 F (36.4 C)   Resp 16   Ht 5\' 8"  (1.727 m)   Wt 235 lb 8 oz (106.8 kg)   LMP  (LMP Unknown)   SpO2 94%   BMI 35.81 kg/m   Wt Readings from Last 3 Encounters:  01/24/17 235 lb 8 oz (106.8 kg)  12/23/16 243 lb 12 oz (110.6 kg)  12/16/16 240 lb (108.9 kg)    Physical Exam  Constitutional: She is oriented to person, place, and time. She appears well-developed and well-nourished.  HENT:  Head: Normocephalic and atraumatic.  Right Ear: Hearing, tympanic membrane  and external ear normal. A foreign body is present.  Left Ear: Hearing, tympanic membrane and external ear normal. A foreign body is present.  Nose: Mucosal edema and rhinorrhea present.  Mouth/Throat: Uvula is midline and oropharynx is clear and moist. No oropharyngeal exudate.  Cerumen bilaterally  Neck: Neck supple.  Cardiovascular: Normal rate and regular rhythm.   Pulmonary/Chest: Effort normal and breath sounds normal. No respiratory distress. She has no wheezes. She has no rales.  Abdominal: Soft. Bowel sounds are  normal. She exhibits no mass. There is no hepatosplenomegaly. There is no tenderness.  Musculoskeletal: She exhibits no edema.  Lymphadenopathy:    She has no cervical adenopathy.  Neurological: She is alert and oriented to person, place, and time.  Skin: Skin is warm and dry.  Psychiatric: She has a normal mood and affect. Her behavior is normal.  Vitals reviewed.       Assessment & Plan:   Encounter Diagnoses  Name Primary?  . Acute sinusitis, recurrence not specified, unspecified location Yes  . Non-intractable vomiting with nausea, unspecified vomiting type      -rx amoxicillin for sinusitis -rx phenergan for N/V -rx tessalon for cough -counseled pt rest, fluids.  Gently advance diet as tolerated starting with bland foods like crackers, toast.  Gave handout -pt to RTO if not better 48 hours or sooner for worsening or new symptoms

## 2017-03-01 ENCOUNTER — Ambulatory Visit (INDEPENDENT_AMBULATORY_CARE_PROVIDER_SITE_OTHER): Payer: Self-pay | Admitting: Cardiology

## 2017-03-01 ENCOUNTER — Encounter: Payer: Self-pay | Admitting: Cardiology

## 2017-03-01 VITALS — BP 158/80 | HR 71 | Ht 67.0 in | Wt 240.6 lb

## 2017-03-01 DIAGNOSIS — I4891 Unspecified atrial fibrillation: Secondary | ICD-10-CM

## 2017-03-01 DIAGNOSIS — R0602 Shortness of breath: Secondary | ICD-10-CM

## 2017-03-01 DIAGNOSIS — Z9889 Other specified postprocedural states: Secondary | ICD-10-CM

## 2017-03-01 DIAGNOSIS — I10 Essential (primary) hypertension: Secondary | ICD-10-CM

## 2017-03-01 NOTE — Progress Notes (Signed)
Clinical Summary Toni [name] is a 61 y.o.female seen today for follow up of the following medical problems.  1. Mitral regurgitation - history of previous MV repair at [facility] 02/2015 - Radical Repair with resection of P2 Prolapse, 26 mm Physio II Ring Annuloplasty, Tricuspid Valve Repair (26 mm Ring Annuloplasty, Cox Maze IV Procedure on 02/27/2015 - cath prior to surgery showed no significant CAD - post repair echo 02/2015 showed no MR, mild TR. There are no reported gradients across the valves in the report - has had some recent SOB. Increased DOE over the last few months, at 1/2 block. Subjective abdominal distension. +orthopnea x 3 pillows which is chronic. Reports some weight gain.   02/2015 PFTs normal(on care everywhere) -08/2016 echo showed elevated gradients across her repaired MV and TV consistent with post repair stenosis.  - TEE 10/2016 showed restricted motion of posterior MV leaflet with mitral stenosis, mean resting gradient of 8 mmHg.  - DOE at short distances, can be room to room at home. Or example walking to car or mailbox. Continued orthopnea - no recent LE edema, some abdominal distension. - compliant with torsemide 20mg  daily. Home weights around [place].    - still with some SOB. Fairly sedentary lifestyle mainly due to chronic lower back pain, likely large part is deconditioining - evaluted by Dr Ricard Dillon regarding her prior MV repair and current elevated gradients, recs are for medical therapy at this time  2. Afib - reports recent palpitations, typically with activity but can occur at rest. Lasts just a few minutes. Occurs 1-2 times a week - can have sudden dizziness at times.  - has not been on anticoag.  - 06/2016 event monitor without evidence of afib, occasional PVCs.   - denies any recent palpitations.    3. HTN - just took meds this morning prior to appt, reports medication compliance.   4. Chronic lower back pain - ongoing, has leg  pains as well.   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 Congestive heart failure, unspecified    Diastolic heart failure  . Degeneration of lumbar or lumbosacral intervertebral disc   . Depression   . Dyspnea     chronic multifactorial large component of deconditioning  . Esophageal reflux   . Insomnia    Working third shift  . Mitral stenosis    Secondary to under-sized ring annuloplasty following mitral valve repair  . Mitral valve disorders(424.0)    Mild to moderate mitral regurgitation  . Mitral valve insufficiency and aortic valve insufficiency   . Morbid obesity ([place])   . Normocytic anemia   . Other chest pain   . Other dyspnea and respiratory abnormality   . Paroxysmal atrial fibrillation ([name])    Stable no recurrence  . Peripheral edema    chronic  . S/P Maze operation for atrial fibrillation 02/27/2015  . S/P mitral valve repair 02/27/2015   "radical repair" including resection of P2 segment of posterior leaflet with 26 mm Edwards Physio II annuloplasty ring  . S/P tricuspid valve repair 02/27/2015   26 mm annuloplasty ring  . Unspecified essential hypertension      Allergies  Allergen Reactions  . Iodinated Diagnostic Agents Swelling    EYES     Current Outpatient Prescriptions  Medication Sig Dispense Refill  . amoxicillin (AMOXIL) 500 MG capsule Take 1 capsule (500 mg total) by mouth 3 (three) times daily. [place]  capsule 0  . aspirin 325 MG EC tablet Take 325 mg by mouth daily.    . benzonatate (TESSALON PERLES) 100 MG capsule 1-2 po q 8 hour prn cough 20 capsule 3  . dimenhyDRINATE (DRAMAMINE) 50 MG tablet Take 50 mg by mouth at bedtime as needed (to help her sleep).    . diphenhydrAMINE (BENADRYL) 25 MG tablet Take 25 mg by mouth at bedtime as needed for allergies or sleep.     Marland Kitchen FLUoxetine (PROZAC) 20 MG capsule Take 1 capsule (20 mg total) by mouth daily. 90 capsule 1  . gabapentin  (NEURONTIN) 300 MG capsule TAKE 300MG  1 TAB IN AM AND 600MG  2 TABS PM 270 capsule 0  . ibuprofen (ADVIL,MOTRIN) 200 MG tablet Take 800 mg by mouth every 8 (eight) hours as needed for mild pain. Alternates with Norco if needed    . indomethacin (INDOCIN) 25 MG capsule 1 po q 8 hour prn gout pain 60 capsule 1  . linaclotide (LINZESS) 72 MCG capsule Take 72 mcg by mouth daily before breakfast.    . Liniments (SALONPAS PAIN RELIEF PATCH EX) Apply 1 patch topically daily as needed (pain).    Marland Kitchen lisinopril (PRINIVIL,ZESTRIL) 10 MG tablet Take 1 tablet (10 mg total) by mouth daily. 90 tablet 1  . magnesium gluconate (MAGONATE) 500 MG tablet Take 500 mg by mouth daily.    . metoprolol (LOPRESSOR) 100 MG tablet Take 1 tablet (100 mg total) by mouth 2 (two) times daily. 180 tablet 1  . metoprolol tartrate (LOPRESSOR) 25 MG tablet Take 1 tablet (25 mg total) by mouth 2 (two) times daily. 180 tablet 3  . omeprazole (PRILOSEC) 40 MG capsule Take 1 capsule (40 mg total) by mouth daily. 90 capsule 1  . Potassium 99 MG TABS Take 99 mg by mouth daily.    . promethazine (PHENERGAN) 25 MG tablet 1/2 to 1 tablet po q 8 hour prn N/V 10 tablet 0  . torsemide (DEMADEX) 20 MG tablet Take 1 tablet (20 mg total) by mouth daily. 30 tablet 2   No current facility-administered medications for this visit.      Past Surgical History:  Procedure Laterality Date  . APPENDECTOMY    . BREAST BIOPSY Left 2009  . CARDIAC VALVE SURGERY  02/27/2015   Mikel Cella. Mitral Valve Repair. Radical Repair with resection of P2 Prolapse, 26 mm Physio II Ring Annuloplasty, Tricuspid Valve Repair (26 mm Ring Annuloplasty, Cox Maze IV Procedure on 02/27/2015  . CHOLECYSTECTOMY    . COLONOSCOPY  2011   Dr. Laural Golden: 32mm polyp from hepatic flexure (tubular adenoma)  . COLONOSCOPY N/A 11/10/2016   Procedure: COLONOSCOPY;  Surgeon: Daneil Dolin, MD;  Location: [facility];  Service: Endoscopy;  Laterality: N/A;  2:00 pm  .  ESOPHAGOGASTRODUODENOSCOPY  2005    Dr. Gala Romney : normal esophagus s/p dilation. Small hiatal hernia noted. Felt improved after dilation.   . ESOPHAGOGASTRODUODENOSCOPY N/A 11/10/2016   Procedure: ESOPHAGOGASTRODUODENOSCOPY (EGD);  Surgeon: Daneil Dolin, MD;  Location: [facility];  Service: Endoscopy;  Laterality: N/A;  . HEEL SPUR SURGERY Right   . MALONEY DILATION N/A 11/10/2016   Procedure: Venia Minks DILATION;  Surgeon: Daneil Dolin, MD;  Location: [facility];  Service: Endoscopy;  Laterality: N/A;  . MINIMALLY INVASIVE TRICUSPID VALVE REPAIR  02/27/2015   [place]  . MITRAL VALVE REPAIR  02/27/2015   [place]  . TEE WITHOUT CARDIOVERSION N/A 10/18/2016   Procedure:  TRANSESOPHAGEAL ECHOCARDIOGRAM (TEE);  Surgeon: Arnoldo Lenis, MD;  Location: [facility];  Service: Endoscopy;  Laterality: N/A;  . TOTAL ABDOMINAL HYSTERECTOMY    . TOTAL KNEE ARTHROPLASTY Right   . TUBAL LIGATION       Allergies  Allergen Reactions  . Iodinated Diagnostic Agents Swelling    EYES      Family History  Problem Relation Age of Onset  . Hypertension Mother   . Heart disease Mother   . Kidney disease Father   . Stroke Father   . Diabetes Sister   . Kidney disease Sister   . Heart disease Sister   . Hypertension Sister   . Heart disease Brother   . Heart attack Son   . Hypertension Son   . Cancer Paternal Grandmother     Breast Cancer  . Heart disease Brother   . Colon cancer Neg Hx      Social History Ms. Sculley reports that she quit smoking about 21 years ago. Her smoking use included Cigarettes. She has a 12.50 pack-year smoking history. She quit smokeless tobacco use about 43 years ago. Her smokeless tobacco use included Chew. Ms. Fewell reports that she does not drink alcohol.   Review of Systems CONSTITUTIONAL: No weight loss, fever, chills, weakness or fatigue.  HEENT: Eyes: No visual loss, blurred vision, double vision  or yellow sclerae.No hearing loss, sneezing, congestion, runny nose or sore throat.  SKIN: No rash or itching.  CARDIOVASCULAR: per HPI RESPIRATORY: No shortness of breath, cough or sputum.  GASTROINTESTINAL: No anorexia, nausea, vomiting or diarrhea. No abdominal pain or blood.  GENITOURINARY: No burning on urination, no polyuria NEUROLOGICAL: No headache, dizziness, syncope, paralysis, ataxia, numbness or tingling in the extremities. No change in bowel or bladder control.  MUSCULOSKELETAL: +back pain LYMPHATICS: No enlarged nodes. No history of splenectomy.  PSYCHIATRIC: No history of depression or anxiety.  ENDOCRINOLOGIC: No reports of sweating, cold or heat intolerance. No polyuria or polydipsia.  Marland Kitchen   Physical Examination Vitals:   03/01/17 1049  BP: (!) 158/80  Pulse: 71   Vitals:   03/01/17 1049  Weight: 240 lb 9.6 oz (109.1 kg)  Height: 5\' 7"  (1.702 m)    Gen: resting comfortably, no acute distress HEENT: no scleral icterus, pupils equal round and reactive, no palptable cervical adenopathy,  CV: RRR, no jvd, no carotid bruits Resp: Clear to auscultation bilaterally GI: abdomen is soft, non-tender, non-distended, normal bowel sounds, no hepatosplenomegaly MSK: extremities are warm, no edema.  Skin: warm, no rash Neuro:  no focal deficits Psych: appropriate affect   Diagnostic Studies 08/2016 echo Study Conclusions  - Left ventricle: The cavity size was normal. Wall thickness was increased in a pattern of mild LVH. Systolic function was normal. The estimated ejection fraction was in the range of 50% to 55%. Wall motion was normal; there were no regional wall motion abnormalities. The study was not technically sufficient to allow evaluation of LV diastolic dysfunction due to atrial fibrillation. - Aortic valve: Mildly calcified annulus. Trileaflet; mildly thickened leaflets. There was mild regurgitation. Valve area (VTI): 2.47 cm^2. Valve area  (Vmax): 2.47 cm^2. Valve area (Vmean): 2.43 cm^2. - Mitral valve: The is a MV anular ring present s/p mitral valve repair. There is an increased gradient across the MV. Mean gradient 12 mmHg. - Left atrium: The atrium was severely dilated. - Right ventricle: The cavity size was mildly dilated. Systolic function was mildly to moderately reduced. - Right atrium: The atrium was  mildly dilated. - Tricuspid valve: A TV anular ring is present. Mean gradient across the TV of 7 mmHg. - Pulmonary arteries: Systolic pressure was mildly increased. PA peak pressure: 31 mm Hg (S). - Technically adequate study.   10/2016 TEE Study Conclusions  - Left ventricle: The cavity size was normal. Wall thickness was normal. Systolic function was normal. The estimated ejection fraction was in the range of 55% to 60%. - Mitral valve: The posterior leaflet is moderately thickened with severely restricted motion. There is a moderate to severe gradient across the MV of 8 mmHg. There was mild regurgitation. Mean gradient (D): 8 mm Hg. - Left atrium: The atrium was severely dilated. - Right atrium: No evidence of thrombus in the atrial cavity or appendage.       Assessment and Plan  1. Valvular heart disease/SOB - previous MV and TV repair - recent TTE and TEE shows elevated gradient across her MV. Mean gradient of 8 across MV at rest, restricted motion of posterior leaflet.  - continue medical management at this time. See if her SOB/DOE improves with improved exercise/conditioing.   2. Afib - from previous notes looks like there were discussions about anticoag however patient was never committed to it.  - CHADS2Vasc score is 2, anticoag would be indicated.  - she is s/p Maze procedure. Recent monitor did not show any recurrence of afib. EKG in clinic today shows sinus rhythm - we will continue to monitor at this time, if recurrent will require anticoagulation.    3.  HTN - elevated in clinic, home numbers have been at goal historically - continue to monitor at this time.    F/u 4 months      Arnoldo Lenis, M.D

## 2017-03-01 NOTE — Patient Instructions (Signed)
Your physician recommends that you schedule a follow-up appointment in: 4 MONTHS WITH DR BRANCH  Your physician recommends that you continue on your current medications as directed. Please refer to the Current Medication list given to you today.  Thank you for choosing [facility]!!    

## 2017-03-10 ENCOUNTER — Other Ambulatory Visit: Payer: Self-pay | Admitting: Physician Assistant

## 2017-03-22 ENCOUNTER — Encounter: Payer: Self-pay | Admitting: Physician Assistant

## 2017-03-22 ENCOUNTER — Ambulatory Visit: Payer: Self-pay | Admitting: Physician Assistant

## 2017-03-22 ENCOUNTER — Other Ambulatory Visit: Payer: Self-pay | Admitting: Physician Assistant

## 2017-03-22 ENCOUNTER — Other Ambulatory Visit (HOSPITAL_COMMUNITY)
Admission: RE | Admit: 2017-03-22 | Discharge: 2017-03-22 | Disposition: A | Discharge status: Home / Self Care | Payer: Self-pay | Source: Ambulatory Visit | Attending: Physician Assistant | Admitting: Physician Assistant

## 2017-03-22 VITALS — BP 154/80 | HR 70 | Temp 97.5°F | Ht 68.0 in | Wt 237.0 lb

## 2017-03-22 DIAGNOSIS — E669 Obesity, unspecified: Secondary | ICD-10-CM

## 2017-03-22 DIAGNOSIS — K5900 Constipation, unspecified: Secondary | ICD-10-CM

## 2017-03-22 DIAGNOSIS — M545 Low back pain: Secondary | ICD-10-CM

## 2017-03-22 DIAGNOSIS — G8929 Other chronic pain: Secondary | ICD-10-CM

## 2017-03-22 DIAGNOSIS — I10 Essential (primary) hypertension: Secondary | ICD-10-CM

## 2017-03-22 DIAGNOSIS — I38 Endocarditis, valve unspecified: Secondary | ICD-10-CM

## 2017-03-22 DIAGNOSIS — Z862 Personal history of diseases of the blood and blood-forming organs and certain disorders involving the immune mechanism: Secondary | ICD-10-CM

## 2017-03-22 DIAGNOSIS — N189 Chronic kidney disease, unspecified: Secondary | ICD-10-CM

## 2017-03-22 LAB — HEMOGLOBIN AND HEMATOCRIT, BLOOD
HCT: 34.1 % — ABNORMAL LOW (ref 36.0–46.0)
Hemoglobin: 10.9 g/dL — ABNORMAL LOW (ref 12.0–15.0)

## 2017-03-22 LAB — BASIC METABOLIC PANEL
Anion gap: 8 (ref 5–15)
BUN: 24 mg/dL — ABNORMAL HIGH (ref 6–20)
CO2: 26 mmol/L (ref 22–32)
Calcium: 9.3 mg/dL (ref 8.9–10.3)
Chloride: 105 mmol/L (ref 101–111)
Creatinine, Ser: 1.12 mg/dL — ABNORMAL HIGH (ref 0.44–1.00)
GFR calc Af Amer: 59 mL/min — ABNORMAL LOW (ref >60)
GFR calc non Af Amer: 51 mL/min — ABNORMAL LOW (ref >60)
Glucose, Bld: 90 mg/dL (ref 65–99)
Potassium: 3.2 mmol/L — ABNORMAL LOW (ref 3.5–5.1)
Sodium: 139 mmol/L (ref 135–145)

## 2017-03-22 MED ORDER — POTASSIUM CHLORIDE ER 10 MEQ PO TBCR: 10.0000 meq | EXTENDED_RELEASE_TABLET | Freq: Every day | ORAL | 0 refills | Status: DC

## 2017-03-22 MED ORDER — METOPROLOL TARTRATE 25 MG PO TABS: 25.0000 mg | ORAL_TABLET | Freq: Two times a day (BID) | ORAL | 3 refills | Status: DC

## 2017-03-22 NOTE — Progress Notes (Signed)
BP (!) 154/80 (BP Location: Left Arm, Patient Position: Sitting, Cuff Size: Large)   Pulse 70   Temp 97.5 F (36.4 C)   Ht 5\' 8"  (1.727 m)   Wt 237 lb (107.5 kg)   LMP  (LMP Unknown)   SpO2 92%   BMI 36.04 kg/m    Subjective:    Patient ID: Toni [name], female    DOB: 06-12-1956, 61 y.o.   MRN: [id number]  HPI: Toni [name] is a 61 y.o. female presenting on 03/22/2017 for Follow-up   HPI   Pt says she didn't take her meds this morning because she didn't eat.  She says that is why her bp high this morning.  Pt says dr Harl Bowie discontinued her metorpolol 25 mg.  Reviewed record.  Does not appear that he discontinued it.  It appears that it was marked as she wasn't taking it.  Pt says she is doing some back exercises  Relevant past medical, surgical, family and social history reviewed and updated as indicated. Interim medical history since our last visit reviewed. Allergies and medications reviewed and updated.   Current Outpatient Prescriptions:  .  aspirin 325 MG EC tablet, Take 325 mg by mouth daily., Disp: , Rfl:  .  dimenhyDRINATE (DRAMAMINE) 50 MG tablet, Take 50 mg by mouth at bedtime as needed (to help her sleep)., Disp: , Rfl:  .  diphenhydrAMINE (BENADRYL) 25 MG tablet, Take 25 mg by mouth at bedtime as needed for allergies or sleep. , Disp: , Rfl:  .  gabapentin (NEURONTIN) 300 MG capsule, TAKE 300MG  1 TAB IN AM AND 600MG  2 TABS PM, Disp: 270 capsule, Rfl: 0 .  ibuprofen (ADVIL,MOTRIN) 200 MG tablet, Take 800 mg by mouth every 8 (eight) hours as needed for mild pain. Alternates with Norco if needed, Disp: , Rfl:  .  indomethacin (INDOCIN) 25 MG capsule, 1 po q 8 hour prn gout pain, Disp: 60 capsule, Rfl: 1 .  Liniments (SALONPAS PAIN RELIEF PATCH EX), Apply 1 patch topically daily as needed (pain)., Disp: , Rfl:  .  lisinopril (PRINIVIL,ZESTRIL) 10 MG tablet, TAKE 1 Tablet BY MOUTH ONCE DAILY, Disp: 90 tablet, Rfl: 2 .  magnesium gluconate (MAGONATE) 500 MG  tablet, Take 500 mg by mouth daily., Disp: , Rfl:  .  metoprolol (LOPRESSOR) 100 MG tablet, TAKE 1 Tablet  BY MOUTH TWICE DAILY, Disp: 180 tablet, Rfl: 2 .  omeprazole (PRILOSEC) 40 MG capsule, Take 1 capsule (40 mg total) by mouth daily., Disp: 90 capsule, Rfl: 1 .  Potassium 99 MG TABS, Take 99 mg by mouth daily., Disp: , Rfl:  .  PROZAC 20 MG capsule, TAKE 1 Capsule BY MOUTH ONCE DAILY, Disp: 90 capsule, Rfl: 1 .  torsemide (DEMADEX) 20 MG tablet, Take 1 tablet (20 mg total) by mouth daily., Disp: 30 tablet, Rfl: 2 .  benzonatate (TESSALON PERLES) 100 MG capsule, 1-2 po q 8 hour prn cough (Patient not taking: Reported on 03/22/2017), Disp: 20 capsule, Rfl: 3   Review of Systems  Constitutional: Positive for diaphoresis and fatigue. Negative for appetite change, chills, fever and unexpected weight change.  HENT: Positive for congestion and dental problem. Negative for drooling, ear pain, facial swelling, hearing loss, mouth sores, sneezing, sore throat, trouble swallowing and voice change.   Eyes: Positive for visual disturbance. Negative for pain, discharge, redness and itching.  Respiratory: Positive for shortness of breath and wheezing. Negative for cough and choking.   Cardiovascular: Positive for palpitations  and leg swelling. Negative for chest pain.  Gastrointestinal: Positive for constipation. Negative for abdominal pain, blood in stool, diarrhea and vomiting.  Endocrine: Negative for cold intolerance, heat intolerance and polydipsia.  Genitourinary: Negative for decreased urine volume, dysuria and hematuria.  Musculoskeletal: Positive for arthralgias, back pain and gait problem.  Skin: Negative for rash.  Allergic/Immunologic: Positive for environmental allergies.  Neurological: Negative for seizures, syncope, light-headedness and headaches.  Hematological: Negative for adenopathy.  Psychiatric/Behavioral: Negative for agitation, dysphoric mood and suicidal ideas. The patient is  nervous/anxious.     Per HPI unless specifically indicated above     Objective:    BP (!) 154/80 (BP Location: Left Arm, Patient Position: Sitting, Cuff Size: Large)   Pulse 70   Temp 97.5 F (36.4 C)   Ht 5\' 8"  (1.727 m)   Wt 237 lb (107.5 kg)   LMP  (LMP Unknown)   SpO2 92%   BMI 36.04 kg/m   Wt Readings from Last 3 Encounters:  03/22/17 237 lb (107.5 kg)  03/01/17 240 lb 9.6 oz (109.1 kg)  01/24/17 235 lb 8 oz (106.8 kg)    Physical Exam  Constitutional: She is oriented to person, place, and time. She appears well-developed and well-nourished.  HENT:  Head: Normocephalic and atraumatic.  Neck: Neck supple.  Cardiovascular: Normal rate and regular rhythm.   Pulmonary/Chest: Effort normal and breath sounds normal.  Abdominal: Soft. Bowel sounds are normal. She exhibits no mass. There is no hepatosplenomegaly. There is no tenderness.  Musculoskeletal: She exhibits no edema.  Lymphadenopathy:    She has no cervical adenopathy.  Neurological: She is alert and oriented to person, place, and time.  Skin: Skin is warm and dry.  Psychiatric: She has a normal mood and affect. Her behavior is normal.  Vitals reviewed.       Assessment & Plan:    Encounter Diagnoses  Name Primary?  . Hypertension, unspecified type Yes  . Chronic kidney disease, unspecified CKD stage   . Chronic low back pain, unspecified back pain laterality, with sciatica presence unspecified   . Valvular heart disease   . Constipation, unspecified constipation type   . Obesity, unspecified classification, unspecified obesity type, unspecified whether serious comorbidity present   . History of anemia     -bp high.  Resume metoprolol 25mg  bid in addition to the 100mg  bid. Pt to continue other medication -Check bmp and h/h today -pt counseled to continue back exercises -pt to follow up in 6 wk to recheck the BP. RTO sooner prn

## 2017-03-28 DIAGNOSIS — Z736 Limitation of activities due to disability: Secondary | ICD-10-CM

## 2017-03-29 ENCOUNTER — Other Ambulatory Visit: Payer: Self-pay | Admitting: Physician Assistant

## 2017-03-29 MED ORDER — METOPROLOL TARTRATE 25 MG PO TABS: 25.0000 mg | ORAL_TABLET | Freq: Two times a day (BID) | ORAL | 3 refills | Status: DC

## 2017-03-29 MED ORDER — OMEPRAZOLE 40 MG PO CPDR: 40.0000 mg | DELAYED_RELEASE_CAPSULE | Freq: Every day | ORAL | 1 refills | Status: DC

## 2017-03-29 MED ORDER — METOPROLOL TARTRATE 100 MG PO TABS: 100.0000 mg | ORAL_TABLET | Freq: Two times a day (BID) | ORAL | 2 refills | Status: DC

## 2017-03-29 MED ORDER — FLUOXETINE HCL 20 MG PO CAPS: 20.0000 mg | ORAL_CAPSULE | Freq: Every day | ORAL | 1 refills | Status: DC

## 2017-03-29 MED ORDER — LISINOPRIL 10 MG PO TABS: 10.0000 mg | ORAL_TABLET | Freq: Every day | ORAL | 2 refills | Status: DC

## 2017-03-31 ENCOUNTER — Other Ambulatory Visit (HOSPITAL_COMMUNITY)
Admission: RE | Admit: 2017-03-31 | Discharge: 2017-03-31 | Disposition: A | Discharge status: Home / Self Care | Payer: Self-pay | Source: Ambulatory Visit | Attending: Physician Assistant | Admitting: Physician Assistant

## 2017-03-31 LAB — POTASSIUM: Potassium: 3.8 mmol/L (ref 3.5–5.1)

## 2017-04-06 ENCOUNTER — Other Ambulatory Visit: Payer: Self-pay

## 2017-04-06 MED ORDER — GABAPENTIN 300 MG PO CAPS: ORAL_CAPSULE | ORAL | 0 refills | Status: DC

## 2017-04-17 ENCOUNTER — Inpatient Hospital Stay (HOSPITAL_COMMUNITY)
Admission: EM | Admit: 2017-04-17 | Discharge: 2017-04-19 | DRG: 194 | Disposition: A | Discharge status: Home / Self Care | Payer: Medicaid Other | Attending: Internal Medicine | Admitting: Internal Medicine

## 2017-04-17 ENCOUNTER — Emergency Department (HOSPITAL_COMMUNITY): Payer: Medicaid Other

## 2017-04-17 ENCOUNTER — Other Ambulatory Visit: Payer: Self-pay

## 2017-04-17 ENCOUNTER — Encounter (HOSPITAL_COMMUNITY): Payer: Self-pay | Admitting: *Deleted

## 2017-04-17 DIAGNOSIS — F329 Major depressive disorder, single episode, unspecified: Secondary | ICD-10-CM | POA: Diagnosis present

## 2017-04-17 DIAGNOSIS — Z8679 Personal history of other diseases of the circulatory system: Secondary | ICD-10-CM

## 2017-04-17 DIAGNOSIS — I4891 Unspecified atrial fibrillation: Secondary | ICD-10-CM | POA: Diagnosis present

## 2017-04-17 DIAGNOSIS — I5032 Chronic diastolic (congestive) heart failure: Secondary | ICD-10-CM | POA: Diagnosis present

## 2017-04-17 DIAGNOSIS — E876 Hypokalemia: Secondary | ICD-10-CM | POA: Diagnosis present

## 2017-04-17 DIAGNOSIS — R7989 Other specified abnormal findings of blood chemistry: Secondary | ICD-10-CM

## 2017-04-17 DIAGNOSIS — R778 Other specified abnormalities of plasma proteins: Secondary | ICD-10-CM

## 2017-04-17 DIAGNOSIS — I130 Hypertensive heart and chronic kidney disease with heart failure and stage 1 through stage 4 chronic kidney disease, or unspecified chronic kidney disease: Secondary | ICD-10-CM | POA: Diagnosis present

## 2017-04-17 DIAGNOSIS — K219 Gastro-esophageal reflux disease without esophagitis: Secondary | ICD-10-CM | POA: Diagnosis present

## 2017-04-17 DIAGNOSIS — Z91041 Radiographic dye allergy status: Secondary | ICD-10-CM

## 2017-04-17 DIAGNOSIS — Z96651 Presence of right artificial knee joint: Secondary | ICD-10-CM | POA: Diagnosis present

## 2017-04-17 DIAGNOSIS — Z9889 Other specified postprocedural states: Secondary | ICD-10-CM

## 2017-04-17 DIAGNOSIS — Z8249 Family history of ischemic heart disease and other diseases of the circulatory system: Secondary | ICD-10-CM

## 2017-04-17 DIAGNOSIS — Z841 Family history of disorders of kidney and ureter: Secondary | ICD-10-CM

## 2017-04-17 DIAGNOSIS — Z79899 Other long term (current) drug therapy: Secondary | ICD-10-CM

## 2017-04-17 DIAGNOSIS — R748 Abnormal levels of other serum enzymes: Secondary | ICD-10-CM

## 2017-04-17 DIAGNOSIS — R062 Wheezing: Secondary | ICD-10-CM | POA: Diagnosis present

## 2017-04-17 DIAGNOSIS — R509 Fever, unspecified: Secondary | ICD-10-CM | POA: Diagnosis present

## 2017-04-17 DIAGNOSIS — I10 Essential (primary) hypertension: Secondary | ICD-10-CM | POA: Diagnosis present

## 2017-04-17 DIAGNOSIS — Z87891 Personal history of nicotine dependence: Secondary | ICD-10-CM

## 2017-04-17 DIAGNOSIS — R05 Cough: Secondary | ICD-10-CM | POA: Diagnosis present

## 2017-04-17 DIAGNOSIS — N182 Chronic kidney disease, stage 2 (mild): Secondary | ICD-10-CM | POA: Diagnosis present

## 2017-04-17 DIAGNOSIS — Z7982 Long term (current) use of aspirin: Secondary | ICD-10-CM

## 2017-04-17 DIAGNOSIS — R0602 Shortness of breath: Secondary | ICD-10-CM | POA: Diagnosis present

## 2017-04-17 DIAGNOSIS — J189 Pneumonia, unspecified organism: Principal | ICD-10-CM | POA: Diagnosis present

## 2017-04-17 HISTORY — DX: Diverticulosis of intestine, part unspecified, without perforation or abscess without bleeding: K57.90

## 2017-04-17 LAB — DIFFERENTIAL
Basophils Absolute: 0 10*3/uL (ref 0.0–0.1)
Basophils Relative: 0 %
Eosinophils Absolute: 0 10*3/uL (ref 0.0–0.7)
Eosinophils Relative: 0 %
Lymphocytes Relative: 12 %
Lymphs Abs: 0.8 10*3/uL (ref 0.7–4.0)
Monocytes Absolute: 0.4 10*3/uL (ref 0.1–1.0)
Monocytes Relative: 6 %
Neutro Abs: 5.8 10*3/uL (ref 1.7–7.7)
Neutrophils Relative %: 82 %

## 2017-04-17 LAB — COMPREHENSIVE METABOLIC PANEL
ALT: 37 U/L (ref 14–54)
AST: 47 U/L — ABNORMAL HIGH (ref 15–41)
Albumin: 3.8 g/dL (ref 3.5–5.0)
Alkaline Phosphatase: 110 U/L (ref 38–126)
Anion gap: 12 (ref 5–15)
BUN: 20 mg/dL (ref 6–20)
CO2: 27 mmol/L (ref 22–32)
Calcium: 9.6 mg/dL (ref 8.9–10.3)
Chloride: 100 mmol/L — ABNORMAL LOW (ref 101–111)
Creatinine, Ser: 1.35 mg/dL — ABNORMAL HIGH (ref 0.44–1.00)
GFR calc Af Amer: 47 mL/min — ABNORMAL LOW (ref >60)
GFR calc non Af Amer: 41 mL/min — ABNORMAL LOW (ref >60)
Glucose, Bld: 112 mg/dL — ABNORMAL HIGH (ref 65–99)
Potassium: 3.2 mmol/L — ABNORMAL LOW (ref 3.5–5.1)
Sodium: 139 mmol/L (ref 135–145)
Total Bilirubin: 1 mg/dL (ref 0.3–1.2)
Total Protein: 8.3 g/dL — ABNORMAL HIGH (ref 6.5–8.1)

## 2017-04-17 LAB — LIPASE, BLOOD: Lipase: 32 U/L (ref 11–51)

## 2017-04-17 LAB — URINALYSIS, ROUTINE W REFLEX MICROSCOPIC
Bacteria, UA: NONE SEEN
Bilirubin Urine: NEGATIVE
Glucose, UA: NEGATIVE mg/dL
Hgb urine dipstick: NEGATIVE
Ketones, ur: NEGATIVE mg/dL
Leukocytes, UA: NEGATIVE
Nitrite: NEGATIVE
Protein, ur: NEGATIVE mg/dL
Specific Gravity, Urine: 1 — ABNORMAL LOW (ref 1.005–1.030)
Squamous Epithelial / LPF: NONE SEEN
WBC, UA: NONE SEEN WBC/hpf (ref 0–5)
pH: 5 (ref 5.0–8.0)

## 2017-04-17 LAB — TROPONIN I
Troponin I: 0.06 ng/mL (ref <0.03)
Troponin I: 0.05 ng/mL (ref <0.03)

## 2017-04-17 LAB — CBC
HCT: 37.6 % (ref 36.0–46.0)
Hemoglobin: 12.4 g/dL (ref 12.0–15.0)
MCH: 30.3 pg (ref 26.0–34.0)
MCHC: 33 g/dL (ref 30.0–36.0)
MCV: 91.9 fL (ref 78.0–100.0)
Platelets: 131 10*3/uL — ABNORMAL LOW (ref 150–400)
RBC: 4.09 MIL/uL (ref 3.87–5.11)
RDW: 15.6 % — ABNORMAL HIGH (ref 11.5–15.5)
WBC: 7.1 10*3/uL (ref 4.0–10.5)

## 2017-04-17 LAB — BRAIN NATRIURETIC PEPTIDE: B Natriuretic Peptide: 759 pg/mL — ABNORMAL HIGH (ref 0.0–100.0)

## 2017-04-17 LAB — LACTIC ACID, PLASMA
Lactic Acid, Venous: 1.3 mmol/L (ref 0.5–1.9)
Lactic Acid, Venous: 1.5 mmol/L (ref 0.5–1.9)

## 2017-04-17 LAB — MAGNESIUM: Magnesium: 2 mg/dL (ref 1.7–2.4)

## 2017-04-17 MED ORDER — TORSEMIDE 20 MG PO TABS
20.0000 mg | ORAL_TABLET | Freq: Every day | ORAL | Status: DC
  Administered 2017-04-17 – 2017-04-19 (×3): 20 mg via ORAL
  Filled 2017-04-17 (×3): qty 1

## 2017-04-17 MED ORDER — GABAPENTIN 300 MG PO CAPS
300.0000 mg | ORAL_CAPSULE | Freq: Every morning | ORAL | Status: DC
  Administered 2017-04-18 – 2017-04-19 (×2): 300 mg via ORAL
  Filled 2017-04-17 (×2): qty 1

## 2017-04-17 MED ORDER — ACETAMINOPHEN 325 MG PO TABS
650.0000 mg | ORAL_TABLET | Freq: Once | ORAL | Status: AC | PRN
  Administered 2017-04-17: 650 mg via ORAL

## 2017-04-17 MED ORDER — DEXTROSE 5 % IV SOLN
1.0000 g | Freq: Once | INTRAVENOUS | Status: AC
  Administered 2017-04-17: 1 g via INTRAVENOUS
  Filled 2017-04-17: qty 10

## 2017-04-17 MED ORDER — ASPIRIN 81 MG PO CHEW
324.0000 mg | CHEWABLE_TABLET | Freq: Once | ORAL | Status: AC
  Administered 2017-04-17: 324 mg via ORAL
  Filled 2017-04-17: qty 4

## 2017-04-17 MED ORDER — ACETAMINOPHEN 325 MG PO TABS
ORAL_TABLET | ORAL | Status: AC
  Filled 2017-04-17: qty 2

## 2017-04-17 MED ORDER — GABAPENTIN 300 MG PO CAPS: 300.0000 mg | ORAL_CAPSULE | ORAL | Status: DC

## 2017-04-17 MED ORDER — BENZONATATE 100 MG PO CAPS
100.0000 mg | ORAL_CAPSULE | Freq: Three times a day (TID) | ORAL | Status: DC | PRN
  Administered 2017-04-17: 200 mg via ORAL
  Filled 2017-04-17: qty 2

## 2017-04-17 MED ORDER — SODIUM CHLORIDE 0.9% FLUSH: 3.0000 mL | INTRAVENOUS | Status: DC | PRN

## 2017-04-17 MED ORDER — LISINOPRIL 10 MG PO TABS
10.0000 mg | ORAL_TABLET | Freq: Every day | ORAL | Status: DC
Start: 2017-04-17 — End: 2017-04-18

## 2017-04-17 MED ORDER — ASPIRIN EC 325 MG PO TBEC
325.0000 mg | DELAYED_RELEASE_TABLET | Freq: Every day | ORAL | Status: DC
  Administered 2017-04-18 – 2017-04-19 (×2): 325 mg via ORAL
  Filled 2017-04-17 (×2): qty 1

## 2017-04-17 MED ORDER — METOPROLOL TARTRATE 25 MG PO TABS
25.0000 mg | ORAL_TABLET | Freq: Two times a day (BID) | ORAL | Status: DC
  Administered 2017-04-17: 25 mg via ORAL
  Filled 2017-04-17: qty 1

## 2017-04-17 MED ORDER — GABAPENTIN 300 MG PO CAPS
600.0000 mg | ORAL_CAPSULE | Freq: Every day | ORAL | Status: DC
  Administered 2017-04-17 – 2017-04-18 (×2): 600 mg via ORAL
  Filled 2017-04-17 (×2): qty 2

## 2017-04-17 MED ORDER — SODIUM CHLORIDE 0.9 % IV SOLN: 250.0000 mL | INTRAVENOUS | Status: DC | PRN

## 2017-04-17 MED ORDER — MAGNESIUM GLUCONATE 500 MG PO TABS
500.0000 mg | ORAL_TABLET | Freq: Every day | ORAL | Status: DC
  Filled 2017-04-17 (×2): qty 1

## 2017-04-17 MED ORDER — SODIUM CHLORIDE 0.9% FLUSH
3.0000 mL | Freq: Two times a day (BID) | INTRAVENOUS | Status: DC
  Administered 2017-04-17 – 2017-04-19 (×4): 3 mL via INTRAVENOUS

## 2017-04-17 MED ORDER — POTASSIUM CHLORIDE CRYS ER 10 MEQ PO TBCR
10.0000 meq | EXTENDED_RELEASE_TABLET | Freq: Every day | ORAL | Status: DC
  Filled 2017-04-17 (×3): qty 1

## 2017-04-17 MED ORDER — DEXTROSE 5 % IV SOLN: 1.0000 g | INTRAVENOUS | Status: DC

## 2017-04-17 MED ORDER — POTASSIUM CHLORIDE CRYS ER 20 MEQ PO TBCR
40.0000 meq | EXTENDED_RELEASE_TABLET | Freq: Two times a day (BID) | ORAL | Status: DC
  Administered 2017-04-17 – 2017-04-19 (×4): 40 meq via ORAL
  Filled 2017-04-17 (×4): qty 2

## 2017-04-17 MED ORDER — DEXTROSE 5 % IV SOLN: 500.0000 mg | INTRAVENOUS | Status: DC

## 2017-04-17 MED ORDER — DEXTROSE 5 % IV SOLN
1.0000 g | INTRAVENOUS | Status: DC
  Administered 2017-04-18: 1 g via INTRAVENOUS
  Filled 2017-04-17 (×2): qty 10

## 2017-04-17 MED ORDER — DEXTROSE 5 % IV SOLN
500.0000 mg | Freq: Once | INTRAVENOUS | Status: DC
  Filled 2017-04-17: qty 500

## 2017-04-17 MED ORDER — DEXTROSE 5 % IV SOLN
500.0000 mg | INTRAVENOUS | Status: DC
  Administered 2017-04-17 – 2017-04-18 (×2): 500 mg via INTRAVENOUS
  Filled 2017-04-17 (×3): qty 500

## 2017-04-17 MED ORDER — FLUOXETINE HCL 20 MG PO CAPS
20.0000 mg | ORAL_CAPSULE | Freq: Every day | ORAL | Status: DC
  Administered 2017-04-18 – 2017-04-19 (×2): 20 mg via ORAL
  Filled 2017-04-17 (×2): qty 1

## 2017-04-17 MED ORDER — METOPROLOL TARTRATE 50 MG PO TABS
100.0000 mg | ORAL_TABLET | Freq: Two times a day (BID) | ORAL | Status: DC
  Administered 2017-04-17: 100 mg via ORAL
  Filled 2017-04-17: qty 2

## 2017-04-17 NOTE — ED Notes (Signed)
Pt attempted to get a urine sample. Pt unable to void at this time.

## 2017-04-17 NOTE — H&P (Signed)
History and Physical    ERMELINDA ECKERT OJJ:[id number] DOB: September 04, 1956 DOA: 04/17/2017  PCP: Soyla Dryer, PA-C  Patient coming from: home  Chief Complaint:  Cough, fever, sob  HPI: LUNDON ROSIER is a 61 y.o. female with medical history significant of daistolic chf, HTN, afib, MVR, TVR comes in with 2-3 days of progressive worsening cough and chest soreness when she coughs and breaths with fevers over 101 and feeling horrible.  No n/v.  Chest pain is right sided along lower lateral rib cage.  Cough has been productive.  Pt found to have pna.  But also trop of 0.06.  Dr Elise Benne was concerned about underlying ACS, cardiology at [facility] was called by dr Elise Benne about her chest pain and positive trop, it was recommended to treat her pna.  Pt was therefore referred for admission for further work up.   Review of Systems: As per HPI otherwise 10 point review of systems negative.   Past Medical History:  Diagnosis Date  . Congestive heart failure, unspecified    Diastolic heart failure  . Degeneration of lumbar or lumbosacral intervertebral disc   . Depression   . Diverticulosis   . Dyspnea     chronic multifactorial large component of deconditioning  . Esophageal reflux   . Insomnia    Working third shift  . Mitral stenosis    Secondary to under-sized ring annuloplasty following mitral valve repair  . Mitral valve disorders(424.0)    Mild to moderate mitral regurgitation  . Mitral valve insufficiency and aortic valve insufficiency   . Morbid obesity ([place])   . Normocytic anemia   . Other chest pain   . Other dyspnea and respiratory abnormality   . Paroxysmal atrial fibrillation ([name])    Stable no recurrence  . Peripheral edema    chronic  . S/P Maze operation for atrial fibrillation 02/27/2015  . S/P mitral valve repair 02/27/2015   "radical repair" including resection of P2 segment of posterior leaflet with 26 mm Edwards Physio II annuloplasty ring  . S/P tricuspid valve repair  02/27/2015   26 mm annuloplasty ring  . Unspecified essential hypertension     Past Surgical History:  Procedure Laterality Date  . APPENDECTOMY    . BREAST BIOPSY Left 2009  . CARDIAC VALVE SURGERY  02/27/2015   [facility] Cella. Mitral Valve Repair. Radical Repair with resection of P2 Prolapse, 26 mm Physio II Ring Annuloplasty, Tricuspid Valve Repair (26 mm Ring Annuloplasty, Cox Maze IV Procedure on 02/27/2015  . CHOLECYSTECTOMY    . COLONOSCOPY  2011   Dr. Laural Golden: 34mm polyp from hepatic flexure (tubular adenoma)  . COLONOSCOPY N/A 11/10/2016   Procedure: COLONOSCOPY;  Surgeon: Daneil Dolin, MD;  Location: [facility];  Service: Endoscopy;  Laterality: N/A;  2:00 pm  . ESOPHAGOGASTRODUODENOSCOPY  2005    Dr. Gala Romney : normal esophagus s/p dilation. Small hiatal hernia noted. Felt improved after dilation.   . ESOPHAGOGASTRODUODENOSCOPY N/A 11/10/2016   Procedure: ESOPHAGOGASTRODUODENOSCOPY (EGD);  Surgeon: Daneil Dolin, MD;  Location: [facility];  Service: Endoscopy;  Laterality: N/A;  . HEEL SPUR SURGERY Right   . MALONEY DILATION N/A 11/10/2016   Procedure: Venia Minks DILATION;  Surgeon: Daneil Dolin, MD;  Location: [facility];  Service: Endoscopy;  Laterality: N/A;  . MINIMALLY INVASIVE TRICUSPID VALVE REPAIR  02/27/2015   [facility]  . MITRAL VALVE REPAIR  02/27/2015   [facility]  . TEE WITHOUT CARDIOVERSION N/A 10/18/2016  Procedure: TRANSESOPHAGEAL ECHOCARDIOGRAM (TEE);  Surgeon: Arnoldo Lenis, MD;  Location: [facility];  Service: Endoscopy;  Laterality: N/A;  . TOTAL ABDOMINAL HYSTERECTOMY    . TOTAL KNEE ARTHROPLASTY Right   . TUBAL LIGATION       reports that she quit smoking about 21 years ago. Her smoking use included Cigarettes. She has a 12.50 pack-year smoking history. She quit smokeless tobacco use about 43 years ago. Her smokeless tobacco use included Chew. She reports that she does not drink alcohol or use  drugs.  Allergies  Allergen Reactions  . Iodinated Diagnostic Agents Swelling    EYES    Family History  Problem Relation Age of Onset  . Hypertension Mother   . Heart disease Mother   . Kidney disease Father   . Stroke Father   . Diabetes Sister   . Kidney disease Sister   . Heart disease Sister   . Hypertension Sister   . Heart disease Brother   . Heart attack Son   . Hypertension Son   . Cancer Paternal Grandmother        Breast Cancer  . Heart disease Brother   . Colon cancer Neg Hx     Prior to Admission medications   Medication Sig Start Date End Date Taking? Authorizing Provider  aspirin 325 MG EC tablet Take 325 mg by mouth daily.    [provider]  benzonatate (TESSALON PERLES) 100 MG capsule 1-2 po q 8 hour prn cough Patient not taking: Reported on 03/22/2017 01/24/17   Soyla Dryer, PA-C  dimenhyDRINATE (DRAMAMINE) 50 MG tablet Take 50 mg by mouth at bedtime as needed (to help her sleep).    [provider]  diphenhydrAMINE (BENADRYL) 25 MG tablet Take 25 mg by mouth at bedtime as needed for allergies or sleep.     [provider]  FLUoxetine (PROZAC) 20 MG capsule Take 1 capsule (20 mg total) by mouth daily. 03/29/17   Soyla Dryer, PA-C  gabapentin (NEURONTIN) 300 MG capsule TAKE 300MG  1 TAB IN AM AND 600MG  2 TABS PM 04/06/17   Arnoldo Lenis, MD  ibuprofen (ADVIL,MOTRIN) 200 MG tablet Take 800 mg by mouth every 8 (eight) hours as needed for mild pain. Alternates with Norco if needed    [provider]  indomethacin (INDOCIN) 25 MG capsule 1 po q 8 hour prn gout pain 09/22/16   Soyla Dryer, PA-C  Liniments (SALONPAS PAIN RELIEF PATCH EX) Apply 1 patch topically daily as needed (pain).    [provider]  lisinopril (PRINIVIL,ZESTRIL) 10 MG tablet Take 1 tablet (10 mg total) by mouth daily. 03/29/17   Soyla Dryer, PA-C  magnesium gluconate (MAGONATE) 500 MG tablet Take 500 mg by mouth daily.     [provider]  metoprolol tartrate (LOPRESSOR) 100 MG tablet Take 1 tablet (100 mg total) by mouth 2 (two) times daily. 03/29/17   Soyla Dryer, PA-C  metoprolol tartrate (LOPRESSOR) 25 MG tablet Take 1 tablet (25 mg total) by mouth 2 (two) times daily. (this is taken in addition to 100mg  metoprolol bid) 03/29/17   Soyla Dryer, PA-C  omeprazole (PRILOSEC) 40 MG capsule Take 1 capsule (40 mg total) by mouth daily. 03/29/17   Soyla Dryer, PA-C  Potassium 99 MG TABS Take 99 mg by mouth daily.    [provider]  potassium chloride (KLOR-CON 10) 10 MEQ tablet Take 1 tablet (10 mEq total) by mouth daily. 03/22/17 03/27/17  Soyla Dryer, PA-C  torsemide (  DEMADEX) 20 MG tablet Take 1 tablet (20 mg total) by mouth daily. 12/14/16   Soyla Dryer, PA-C    Physical Exam: Vitals:   04/17/17 1700 04/17/17 1710 04/17/17 1715 04/17/17 1951  BP: (!) 125/99   124/81  Pulse:   79 80  Resp: (!) 24  10 16   Temp:  98.4 F (36.9 C)    TempSrc:  Oral    SpO2:   100% 93%  Weight:      Height:        Constitutional: NAD, calm, comfortable Vitals:   04/17/17 1700 04/17/17 1710 04/17/17 1715 04/17/17 1951  BP: (!) 125/99   124/81  Pulse:   79 80  Resp: (!) 24  10 16   Temp:  98.4 F (36.9 C)    TempSrc:  Oral    SpO2:   100% 93%  Weight:      Height:       Eyes: PERRL, lids and conjunctivae normal ENMT: Mucous membranes are moist. Posterior pharynx clear of any exudate or lesions.Normal dentition.  Neck: normal, supple, no masses, no thyromegaly Respiratory: rhonchi to auscultation on the right, no wheezing, no crackles. Normal respiratory effort. No accessory muscle use.  Cardiovascular: Regular rate and rhythm, no murmurs / rubs / gallops. No extremity edema. 2+ pedal pulses. No carotid bruits.  Abdomen: no tenderness, no masses palpated. No hepatosplenomegaly. Bowel sounds positive.  Musculoskeletal: no clubbing / cyanosis. No joint deformity upper and lower  extremities. Good ROM, no contractures. Normal muscle tone.  Skin: no rashes, lesions, ulcers. No induration Neurologic: CN 2-12 grossly intact. Sensation intact, DTR normal. Strength 5/5 in all 4.  Psychiatric: Normal judgment and insight. Alert and oriented x 3. Normal mood.    Labs on Admission: I have personally reviewed following labs and imaging studies  CBC:  Recent Labs Lab 04/17/17 1558  WBC 7.1  NEUTROABS 5.8  HGB 12.4  HCT 37.6  MCV 91.9  PLT 563*   Basic Metabolic Panel:  Recent Labs Lab 04/17/17 1558  NA 139  K 3.2*  CL 100*  CO2 27  GLUCOSE 112*  BUN 20  CREATININE 1.35*  CALCIUM 9.6   GFR: Estimated Creatinine Clearance: 54.5 mL/min (A) (by C-G formula based on SCr of 1.35 mg/dL (H)). Liver Function Tests:  Recent Labs Lab 04/17/17 1558  AST 47*  ALT 37  ALKPHOS 110  BILITOT 1.0  PROT 8.3*  ALBUMIN 3.8    Recent Labs Lab 04/17/17 1558  LIPASE 32   Cardiac Enzymes:  Recent Labs Lab 04/17/17 1558  TROPONINI 0.06*    Urine analysis:    Component Value Date/Time   COLORURINE COLORLESS (A) 04/17/2017 1916   APPEARANCEUR TURBID (A) 04/17/2017 1916   LABSPEC 1.000 (L) 04/17/2017 1916   PHURINE 5.0 04/17/2017 1916   GLUCOSEU NEGATIVE 04/17/2017 1916   HGBUR NEGATIVE 04/17/2017 1916   BILIRUBINUR NEGATIVE 04/17/2017 1916   KETONESUR NEGATIVE 04/17/2017 1916   PROTEINUR NEGATIVE 04/17/2017 1916   NITRITE NEGATIVE 04/17/2017 1916   LEUKOCYTESUR NEGATIVE 04/17/2017 1916   Radiological Exams on Admission: Ct Abdomen Pelvis Wo Contrast  Result Date: 04/17/2017 CLINICAL DATA:  Productive cough. Right abdominal pain and nausea for the past 3 days. EXAM: CT ABDOMEN AND PELVIS WITHOUT CONTRAST TECHNIQUE: Multidetector CT imaging of the abdomen and pelvis was performed following the standard protocol without IV contrast. COMPARISON:  01/22/2007.  Chest radiographs obtained earlier today. FINDINGS: Lower chest: Minimal patchy opacity in the  right middle lobe and minimal  tree-in-bud opacity in the right lower lobe, limited by breathing motion blurring. Prosthetic mitral and aortic valves. Enlarged heart. Hepatobiliary: Cholecystectomy clips.  Normal appearing liver. Pancreas: Unremarkable. No pancreatic ductal dilatation or surrounding inflammatory changes. Spleen: Normal in size without focal abnormality. Adrenals/Urinary Tract: Tiny upper pole right renal calculus. Otherwise, normal appearing kidneys, ureters, urinary bladder and adrenal glands. No hydronephrosis. Stomach/Bowel: Small hiatal hernia or prominent esophageal ampulla. Scattered colonic diverticula without evidence of diverticulitis. Surgically absent appendix. Unremarkable small bowel. Vascular/Lymphatic: Atheromatous arterial calcifications without aneurysm. No enlarged lymph nodes. Reproductive: Surgically absent uterus.  Normal appearing ovaries. Other: None. Musculoskeletal: Lumbar and lower thoracic spine degenerative changes. IMPRESSION: 1. Findings suspicious for minimal pneumonia in the right middle lobe and minimal infection in the right lower lobe. 2. Tiny, nonobstructing upper pole right renal calculus. 3. Cardiomegaly. 4. Mild colonic diverticulosis. 5. Small hiatal hernia or prominent esophageal ampulla. Electronically Signed   By: Claudie Revering M.D.   On: 04/17/2017 19:05   Dg Chest 2 View  Result Date: 04/17/2017 CLINICAL DATA:  Chest pain, cough, congestion shortness of breath for 3 days. EXAM: CHEST  2 VIEW COMPARISON:  PA and lateral chest 03/18/2015 and 11/28/2013. FINDINGS: There is cardiomegaly and hazy bilateral airspace opacities likely due to edema. The patient is status post mitral and tricuspid valve repair. No pneumothorax or pleural effusion. Atherosclerosis noted. No acute abnormality. IMPRESSION: Cardiomegaly and hazy bilateral airspace disease most consistent with pulmonary edema. Atherosclerosis. Electronically Signed   By: Inge Rise M.D.   On:  04/17/2017 15:41    EKG: Independently reviewed. nsr diffuse twi old c/w to old ekgs  Assessment/Plan 61 yo female with productive cough, fever, sob, right sided chest pain found to have CAP and positive troponin of 0.06  Principal Problem:   CAP (community acquired pneumonia)- iv rocephin and azithro.  Vitals all stable.  Oxygen sats are normal.  Lactic acid level normal.  Prn cough med ordered.  Obs overnight.  Active Problems:   Elevated troponin- doubt ACS.  ekg no acute issues, old twi noted.  Chest pain highly likely due to pna.  Treat pna.  Treat cough.  Serial trop.  Cont aspirin.     HYPOKALEMIA- replete po   Essential hypertension- stable, cont home bblocker and ace inhibitor   ATRIAL FIBRILLATION- cont bblocker, nsr at this time   Chronic diastolic heart failure ([place])- stable and compensated at this time.  Cont home dosing of lasix   Chronic kidney disease, stage II (mild)- stable.  Cr at 1.3   S/P mitral valve repair- noted   S/P Maze operation for atrial fibrillation- noted   S/P tricuspid valve repair- noted     DVT prophylaxis: scds Code Status:  full Family Communication: none  Disposition Plan:  Per day team Consults called:  none Admission status:   observation   Langston Summerfield A MD [facility]  If 7PM-7AM, please contact night-coverage [email or web address] Password [email or web address]  04/17/2017, 8:15 PM

## 2017-04-17 NOTE — Progress Notes (Signed)
Pharmacy Antibiotic Note  Toni [name] is a 61 y.o. female admitted on 04/17/2017 with CAP.  Pharmacy has been consulted for azithromycin and rocephin dosing.  Plan: Azithromycin 500mg  IV q24hs Rocephin 1gm IV q24hrs  Height: 5\' 8"  (172.7 cm) Weight: 235 lb (106.6 kg) IBW/kg (Calculated) : 63.9  Temp (24hrs), Avg:100.1 F (37.8 C), Min:98.4 F (36.9 C), Max:101.8 F (38.8 C)   Recent Labs Lab 04/17/17 1558  WBC 7.1  CREATININE 1.35*  LATICACIDVEN 1.3    Estimated Creatinine Clearance: 54.5 mL/min (A) (by C-G formula based on SCr of 1.35 mg/dL (H)).    Allergies  Allergen Reactions  . Iodinated Diagnostic Agents Swelling    EYES       Thank you for allowing pharmacy to be a part of this patient's care.  Liz Beach 04/17/2017 8:05 PM

## 2017-04-17 NOTE — ED Triage Notes (Addendum)
Pt c/o yellow productive cough, right sided abdominal pain, nausea x 3 days. Denies vomiting, diarrhea. Fever of 102.1 at home prior to coming to ED. No medication taken for fever at home.

## 2017-04-17 NOTE — ED Notes (Signed)
PAtient attempted to give urine sample, missed cup.

## 2017-04-17 NOTE — ED Provider Notes (Signed)
[facility] DEPT Provider Note   CSN: [id number] Arrival date & time: 04/17/17  5465     History   Chief Complaint Chief Complaint  Patient presents with  . Cough  . Abdominal Pain    HPI Toni [name] is a 61 y.o. female.  HPI  Pt was seen at 1600. Per pt, c/o gradual onset and persistence of constant multiple symptoms for the past 3 days. Symptoms include:  cough, generalized chest pain, nausea, RLQ abd pain, fevers. Denies vomiting/diarrhea, no palpitations, no SOB, no back pain, no rash, no black or blood in stools, no sore throat.    Past Medical History:  Diagnosis Date  . Congestive heart failure, unspecified    Diastolic heart failure  . Degeneration of lumbar or lumbosacral intervertebral disc   . Depression   . Diverticulosis   . Dyspnea     chronic multifactorial large component of deconditioning  . Esophageal reflux   . Insomnia    Working third shift  . Mitral stenosis    Secondary to under-sized ring annuloplasty following mitral valve repair  . Mitral valve disorders(424.0)    Mild to moderate mitral regurgitation  . Mitral valve insufficiency and aortic valve insufficiency   . Morbid obesity ([place])   . Normocytic anemia   . Other chest pain   . Other dyspnea and respiratory abnormality   . Paroxysmal atrial fibrillation ([name])    Stable no recurrence  . Peripheral edema    chronic  . S/P Maze operation for atrial fibrillation 02/27/2015  . S/P mitral valve repair 02/27/2015   "radical repair" including resection of P2 segment of posterior leaflet with 26 mm Edwards Physio II annuloplasty ring  . S/P tricuspid valve repair 02/27/2015   26 mm annuloplasty ring  . Unspecified essential hypertension     Patient Active Problem List   Diagnosis Date Noted  . Mitral stenosis   . Dysphagia 10/20/2016  . Constipation 10/20/2016  . S/P mitral valve repair 02/27/2015  . S/P Maze operation for atrial fibrillation 02/27/2015  . S/P tricuspid valve  repair 02/27/2015  . Chronic kidney disease, stage II (mild)   . Insomnia 09/02/2011  . Chronic back pain 09/02/2011  . EDEMA 01/18/2011  . HYPOKALEMIA 09/17/2010  . ANEMIA 09/11/2010  . CHRONIC DIASTOLIC HEART FAILURE 68/10/7516  . OTH NONSPECIFIC ABNORM CV SYSTEM FUNCTION STUDY 08/11/2010  . PRECORDIAL PAIN 07/30/2010  . SHORTNESS OF BREATH 12/26/2009  . ACUTE ON CHRONIC DIASTOLIC HEART FAILURE 00/17/4944  . MITRAL REGURGITATION 07/07/2009  . Essential hypertension 07/07/2009  . ATRIAL FIBRILLATION 07/07/2009    Past Surgical History:  Procedure Laterality Date  . APPENDECTOMY    . BREAST BIOPSY Left 2009  . CARDIAC VALVE SURGERY  02/27/2015   Mikel Cella. Mitral Valve Repair. Radical Repair with resection of P2 Prolapse, 26 mm Physio II Ring Annuloplasty, Tricuspid Valve Repair (26 mm Ring Annuloplasty, Cox Maze IV Procedure on 02/27/2015  . CHOLECYSTECTOMY    . COLONOSCOPY  2011   Dr. Laural Golden: 70mm polyp from hepatic flexure (tubular adenoma)  . COLONOSCOPY N/A 11/10/2016   Procedure: COLONOSCOPY;  Surgeon: Daneil Dolin, MD;  Location: [facility];  Service: Endoscopy;  Laterality: N/A;  2:00 pm  . ESOPHAGOGASTRODUODENOSCOPY  2005    Dr. Gala Romney : normal esophagus s/p dilation. Small hiatal hernia noted. Felt improved after dilation.   . ESOPHAGOGASTRODUODENOSCOPY N/A 11/10/2016   Procedure: ESOPHAGOGASTRODUODENOSCOPY (EGD);  Surgeon: Daneil Dolin, MD;  Location: [facility];  Service: Endoscopy;  Laterality: N/A;  . HEEL SPUR SURGERY Right   . MALONEY DILATION N/A 11/10/2016   Procedure: Venia Minks DILATION;  Surgeon: Daneil Dolin, MD;  Location: [facility];  Service: Endoscopy;  Laterality: N/A;  . MINIMALLY INVASIVE TRICUSPID VALVE REPAIR  02/27/2015   [place]  . MITRAL VALVE REPAIR  02/27/2015   [place]  . TEE WITHOUT CARDIOVERSION N/A 10/18/2016   Procedure: TRANSESOPHAGEAL ECHOCARDIOGRAM (TEE);  Surgeon: Arnoldo Lenis, MD;  Location: [facility];  Service: Endoscopy;  Laterality: N/A;  . TOTAL ABDOMINAL HYSTERECTOMY    . TOTAL KNEE ARTHROPLASTY Right   . TUBAL LIGATION      OB History    No data available       Home Medications    Prior to Admission medications   Medication Sig Start Date End Date Taking? Authorizing Provider  aspirin 325 MG EC tablet Take 325 mg by mouth daily.    [provider]  benzonatate (TESSALON PERLES) 100 MG capsule 1-2 po q 8 hour prn cough Patient not taking: Reported on 03/22/2017 01/24/17   Soyla Dryer, PA-C  dimenhyDRINATE (DRAMAMINE) 50 MG tablet Take 50 mg by mouth at bedtime as needed (to help her sleep).    [provider]  diphenhydrAMINE (BENADRYL) 25 MG tablet Take 25 mg by mouth at bedtime as needed for allergies or sleep.     [provider]  FLUoxetine (PROZAC) 20 MG capsule Take 1 capsule (20 mg total) by mouth daily. 03/29/17   Soyla Dryer, PA-C  gabapentin (NEURONTIN) 300 MG capsule TAKE 300MG  1 TAB IN AM AND 600MG  2 TABS PM 04/06/17   Branch, Alphonse Guild, MD  ibuprofen (ADVIL,MOTRIN) 200 MG tablet Take 800 mg by mouth every 8 (eight) hours as needed for mild pain. Alternates with Norco if needed    [provider]  indomethacin (INDOCIN) 25 MG capsule 1 po q 8 hour prn gout pain 09/22/16   Soyla Dryer, PA-C  Liniments (SALONPAS PAIN RELIEF PATCH EX) Apply 1 patch topically daily as needed (pain).    [provider]  lisinopril (PRINIVIL,ZESTRIL) 10 MG tablet Take 1 tablet (10 mg total) by mouth daily. 03/29/17   Soyla Dryer, PA-C  magnesium gluconate (MAGONATE) 500 MG tablet Take 500 mg by mouth daily.    [provider]  metoprolol tartrate (LOPRESSOR) 100 MG tablet Take 1 tablet (100 mg total) by mouth 2 (two) times daily. 03/29/17   Soyla Dryer, PA-C  metoprolol tartrate (LOPRESSOR) 25 MG tablet Take 1 tablet (25 mg total) by mouth 2 (two) times daily. (this is taken in  addition to 100mg  metoprolol bid) 03/29/17   Soyla Dryer, PA-C  omeprazole (PRILOSEC) 40 MG capsule Take 1 capsule (40 mg total) by mouth daily. 03/29/17   Soyla Dryer, PA-C  Potassium 99 MG TABS Take 99 mg by mouth daily.    [provider]  potassium chloride (KLOR-CON 10) 10 MEQ tablet Take 1 tablet (10 mEq total) by mouth daily. 03/22/17 03/27/17  Soyla Dryer, PA-C  torsemide (DEMADEX) 20 MG tablet Take 1 tablet (20 mg total) by mouth daily. 12/14/16   Soyla Dryer, PA-C    Family History Family History  Problem Relation Age of Onset  . Hypertension Mother   . Heart disease Mother   . Kidney disease Father   . Stroke Father   . Diabetes Sister   . Kidney disease Sister   . Heart disease Sister   .  Hypertension Sister   . Heart disease Brother   . Heart attack Son   . Hypertension Son   . Cancer Paternal Grandmother        Breast Cancer  . Heart disease Brother   . Colon cancer Neg Hx     Social History Social History  Substance Use Topics  . Smoking status: Former Smoker    Packs/day: 0.50    Years: 25.00    Types: Cigarettes    Quit date: 1997  . Smokeless tobacco: Former Systems developer    Types: [place] date: 1975     Comment: dipped tobacco during pregancy in 1975  . Alcohol use No     Allergies   Iodinated diagnostic agents   Review of Systems Review of Systems ROS: Statement: All systems negative except as marked or noted in the HPI; Constitutional: +fever and chills. ; ; Eyes: Negative for eye pain, redness and discharge. ; ; ENMT: Negative for ear pain, hoarseness, nasal congestion, sinus pressure and sore throat. ; ; Cardiovascular: +CP. Negative for palpitations, diaphoresis, dyspnea and peripheral edema. ; ; Respiratory: +cough. Negative for wheezing and stridor. ; ; Gastrointestinal: +nausea, abd pain. Negative for vomiting, diarrhea, blood in stool, hematemesis, jaundice and rectal bleeding. . ; ; Genitourinary: Negative for dysuria,  flank pain and hematuria. ; ; Musculoskeletal: Negative for back pain and neck pain. Negative for swelling and trauma.; ; Skin: Negative for pruritus, rash, abrasions, blisters, bruising and skin lesion.; ; Neuro: Negative for headache, lightheadedness and neck stiffness. Negative for weakness, altered level of consciousness, altered mental status, extremity weakness, paresthesias, involuntary movement, seizure and syncope.       Physical Exam Updated Vital Signs BP (!) 121/57 (BP Location: Right Arm)   Pulse 84   Temp (S) (!) 101.8 F (38.8 C) (Oral)   Resp 20   Ht 5\' 8"  (1.727 m)   Wt 106.6 kg (235 lb)   LMP  (LMP Unknown)   SpO2 93%   BMI 35.73 kg/m   Patient Vitals for the past 24 hrs:  BP Temp Temp src Pulse Resp SpO2 Height Weight  04/17/17 1951 124/81 - - 80 16 93 % - -  04/17/17 1715 - - - 79 10 100 % - -  04/17/17 1710 - 98.4 F (36.9 C) Oral - - - - -  04/17/17 1700 (!) 125/99 - - - (!) 24 - - -  04/17/17 1644 - - - 78 18 99 % - -  04/17/17 1635 - - - 79 (!) 27 93 % - -  04/17/17 1515 - - - - - - 5\' 8"  (1.727 m) 106.6 kg (235 lb)  04/17/17 1514 (!) 121/57 (!) 101.8 F (38.8 C) Oral 84 20 93 % - -     Physical Exam 1605: Physical examination:  Nursing notes reviewed; Vital signs and O2 SAT reviewed; +febrile.;; Constitutional: Well developed, Well nourished, Well hydrated, In no acute distress; Head:  Normocephalic, atraumatic; Eyes: EOMI, PERRL, No scleral icterus; ENMT: TM's clear bilat. +edemetous nasal turbinates bilat with clear rhinorrhea. Mouth and pharynx without lesions. No tonsillar exudates. No intra-oral edema. No submandibular or sublingual edema. No hoarse voice, no drooling, no stridor. No pain with manipulation of larynx. No trismus.  Mouth and pharynx normal, Mucous membranes moist; Neck: Supple, Full range of motion, No lymphadenopathy; Cardiovascular: Regular rate and rhythm, No gallop; Respiratory: Breath sounds clear & equal bilaterally, No wheezes.   Speaking full sentences with ease, Normal  respiratory effort/excursion; Chest: Nontender, Movement normal; Abdomen: Soft, +RLQ tenderness to palp. No rebound or guarding. Nondistended, Normal bowel sounds; Genitourinary: No CVA tenderness; Extremities: Pulses normal, No tenderness, No edema, No calf edema or asymmetry.; Neuro: AA&Ox3, Major CN grossly intact.  Speech clear. No gross focal motor or sensory deficits in extremities.; Skin: Color normal, Warm, Dry.   ED Treatments / Results  Labs (all labs ordered are listed, but only abnormal results are displayed)   EKG  EKG Interpretation  Date/Time:  Sunday April 17 2017 16:40:00 EDT Ventricular Rate:  77 PR Interval:    QRS Duration: 117 QT Interval:  523 QTC Calculation: 592 R Axis:   23 Text Interpretation:  Sinus rhythm Multiple premature complexes, vent & supraven Nonspecific intraventricular conduction delay Nonspecific repol abnormality, diffuse leads When compared with ECG of 06/29/2016 No significant change was found Confirmed by [facility] Ms  MD, Nunzio Cory [phone]) on 04/17/2017 4:49:38 PM       Radiology   Procedures Procedures (including critical care time)  Medications Ordered in ED Medications  acetaminophen (TYLENOL) tablet 650 mg (650 mg Oral Given 04/17/17 1519)     Initial Impression / Assessment and Plan / ED Course  I have reviewed the triage vital signs and the nursing notes.  Pertinent labs & imaging results that were available during my care of the patient were reviewed by me and considered in my medical decision making (see chart for details).  MDM Reviewed: previous chart, nursing note and vitals Reviewed previous: labs and ECG Interpretation: labs, ECG, x-ray and CT scan   Results for orders placed or performed during the hospital encounter of 04/17/17  Lipase, blood  Result Value Ref Range   Lipase 32 11 - 51 U/L  Comprehensive metabolic panel  Result Value Ref Range   Sodium 139 135 - 145 mmol/L    Potassium 3.2 (L) 3.5 - 5.1 mmol/L   Chloride 100 (L) 101 - 111 mmol/L   CO2 27 22 - 32 mmol/L   Glucose, Bld 112 (H) 65 - 99 mg/dL   BUN 20 6 - 20 mg/dL   Creatinine, Ser 1.35 (H) 0.44 - 1.00 mg/dL   Calcium 9.6 8.9 - 10.3 mg/dL   Total Protein 8.3 (H) 6.5 - 8.1 g/dL   Albumin 3.8 3.5 - 5.0 g/dL   AST 47 (H) 15 - 41 U/L   ALT 37 14 - 54 U/L   Alkaline Phosphatase 110 38 - 126 U/L   Total Bilirubin 1.0 0.3 - 1.2 mg/dL   GFR calc non Af Amer 41 (L) >60 mL/min   GFR calc Af Amer 47 (L) >60 mL/min   Anion gap 12 5 - 15  CBC  Result Value Ref Range   WBC 7.1 4.0 - 10.5 K/uL   RBC 4.09 3.87 - 5.11 MIL/uL   Hemoglobin 12.4 12.0 - 15.0 g/dL   HCT 37.6 36.0 - 46.0 %   MCV 91.9 78.0 - 100.0 fL   MCH 30.3 26.0 - 34.0 pg   MCHC 33.0 30.0 - 36.0 g/dL   RDW 15.6 (H) 11.5 - 15.5 %   Platelets 131 (L) 150 - 400 K/uL  Urinalysis, Routine w reflex microscopic  Result Value Ref Range   Color, Urine COLORLESS (A) YELLOW   APPearance TURBID (A) CLEAR   Specific Gravity, Urine 1.000 (L) 1.005 - 1.030   pH 5.0 5.0 - 8.0   Glucose, UA NEGATIVE NEGATIVE mg/dL   Hgb urine dipstick NEGATIVE NEGATIVE   Bilirubin Urine  NEGATIVE NEGATIVE   Ketones, ur NEGATIVE NEGATIVE mg/dL   Protein, ur NEGATIVE NEGATIVE mg/dL   Nitrite NEGATIVE NEGATIVE   Leukocytes, UA NEGATIVE NEGATIVE   RBC / HPF 0-5 0 - 5 RBC/hpf   WBC, UA NONE SEEN 0 - 5 WBC/hpf   Bacteria, UA NONE SEEN NONE SEEN   Squamous Epithelial / LPF NONE SEEN NONE SEEN  Troponin I  Result Value Ref Range   Troponin I 0.06 (HH) <0.03 ng/mL  Brain natriuretic peptide  Result Value Ref Range   B Natriuretic Peptide 759.0 (H) 0.0 - 100.0 pg/mL  Lactic acid, plasma  Result Value Ref Range   Lactic Acid, Venous 1.3 0.5 - 1.9 mmol/L  Differential  Result Value Ref Range   Neutrophils Relative % 82 %   Neutro Abs 5.8 1.7 - 7.7 K/uL   Lymphocytes Relative 12 %   Lymphs Abs 0.8 0.7 - 4.0 K/uL   Monocytes Relative 6 %   Monocytes Absolute 0.4 0.1  - 1.0 K/uL   Eosinophils Relative 0 %   Eosinophils Absolute 0.0 0.0 - 0.7 K/uL   Basophils Relative 0 %   Basophils Absolute 0.0 0.0 - 0.1 K/uL   Ct Abdomen Pelvis Wo Contrast Result Date: 04/17/2017 CLINICAL DATA:  Productive cough. Right abdominal pain and nausea for the past 3 days. EXAM: CT ABDOMEN AND PELVIS WITHOUT CONTRAST TECHNIQUE: Multidetector CT imaging of the abdomen and pelvis was performed following the standard protocol without IV contrast. COMPARISON:  01/22/2007.  Chest radiographs obtained earlier today. FINDINGS: Lower chest: Minimal patchy opacity in the right middle lobe and minimal tree-in-bud opacity in the right lower lobe, limited by breathing motion blurring. Prosthetic mitral and aortic valves. Enlarged heart. Hepatobiliary: Cholecystectomy clips.  Normal appearing liver. Pancreas: Unremarkable. No pancreatic ductal dilatation or surrounding inflammatory changes. Spleen: Normal in size without focal abnormality. Adrenals/Urinary Tract: Tiny upper pole right renal calculus. Otherwise, normal appearing kidneys, ureters, urinary bladder and adrenal glands. No hydronephrosis. Stomach/Bowel: Small hiatal hernia or prominent esophageal ampulla. Scattered colonic diverticula without evidence of diverticulitis. Surgically absent appendix. Unremarkable small bowel. Vascular/Lymphatic: Atheromatous arterial calcifications without aneurysm. No enlarged lymph nodes. Reproductive: Surgically absent uterus.  Normal appearing ovaries. Other: None. Musculoskeletal: Lumbar and lower thoracic spine degenerative changes. IMPRESSION: 1. Findings suspicious for minimal pneumonia in the right middle lobe and minimal infection in the right lower lobe. 2. Tiny, nonobstructing upper pole right renal calculus. 3. Cardiomegaly. 4. Mild colonic diverticulosis. 5. Small hiatal hernia or prominent esophageal ampulla. Electronically Signed   By: Claudie Revering M.D.   On: 04/17/2017 19:05   Dg Chest 2  View Result Date: 04/17/2017 CLINICAL DATA:  Chest pain, cough, congestion shortness of breath for 3 days. EXAM: CHEST  2 VIEW COMPARISON:  PA and lateral chest 03/18/2015 and 11/28/2013. FINDINGS: There is cardiomegaly and hazy bilateral airspace opacities likely due to edema. The patient is status post mitral and tricuspid valve repair. No pneumothorax or pleural effusion. Atherosclerosis noted. No acute abnormality. IMPRESSION: Cardiomegaly and hazy bilateral airspace disease most consistent with pulmonary edema. Atherosclerosis. Electronically Signed   By: Inge Rise M.D.   On: 04/17/2017 15:41    1920:  APAP given for fever with improvement.  CAP on CT scan; IV abx ordered. New elevation of troponin and BNP; no acute EKG changes.  T/C to [facility] Fellow Dr. Jeannine Boga, case discussed, including:  HPI, pertinent PM/SHx, VS/PE, dx testing, ED course and treatment:  Likely demand due to pneumonia, requests IV  abx to tx pneumonia at this time, trend cardiac enzymes, no need for lasix, pt can stay at [facility] and have Cards MD evaluation in the morning.   2010:  T/C to [facility] Dr. Shanon Brow, case discussed, including:  HPI, pertinent PM/SHx, VS/PE, dx testing, ED course and treatment:  Agreeable to admit.    Final Clinical Impressions(s) / ED Diagnoses   Final diagnoses:  None    New Prescriptions New Prescriptions   No medications on file     Francine Graven, DO 04/20/17 0840

## 2017-04-17 NOTE — ED Notes (Signed)
Patients oxygen sat on RA 93%, applied 2 LPM of oxygen via [place], sats increased to 99%.

## 2017-04-17 NOTE — ED Notes (Signed)
EKG given to Dr. McManus.  

## 2017-04-17 NOTE — ED Notes (Signed)
CRITICAL VALUE ALERT  Critical Value:  Troponin 0.06  Date & Time Notied:  04/17/2017 @ 0258  Provider Notified: Dr. Thurnell Garbe  Orders Received/Actions taken: MD made aware.

## 2017-04-18 DIAGNOSIS — N182 Chronic kidney disease, stage 2 (mild): Secondary | ICD-10-CM | POA: Diagnosis present

## 2017-04-18 DIAGNOSIS — J189 Pneumonia, unspecified organism: Secondary | ICD-10-CM | POA: Diagnosis present

## 2017-04-18 DIAGNOSIS — F329 Major depressive disorder, single episode, unspecified: Secondary | ICD-10-CM | POA: Diagnosis present

## 2017-04-18 DIAGNOSIS — Z7982 Long term (current) use of aspirin: Secondary | ICD-10-CM | POA: Diagnosis not present

## 2017-04-18 DIAGNOSIS — J181 Lobar pneumonia, unspecified organism: Secondary | ICD-10-CM

## 2017-04-18 DIAGNOSIS — I5032 Chronic diastolic (congestive) heart failure: Secondary | ICD-10-CM

## 2017-04-18 DIAGNOSIS — K219 Gastro-esophageal reflux disease without esophagitis: Secondary | ICD-10-CM | POA: Diagnosis present

## 2017-04-18 DIAGNOSIS — Z841 Family history of disorders of kidney and ureter: Secondary | ICD-10-CM | POA: Diagnosis not present

## 2017-04-18 DIAGNOSIS — R509 Fever, unspecified: Secondary | ICD-10-CM | POA: Diagnosis present

## 2017-04-18 DIAGNOSIS — I130 Hypertensive heart and chronic kidney disease with heart failure and stage 1 through stage 4 chronic kidney disease, or unspecified chronic kidney disease: Secondary | ICD-10-CM | POA: Diagnosis present

## 2017-04-18 DIAGNOSIS — R748 Abnormal levels of other serum enzymes: Secondary | ICD-10-CM | POA: Diagnosis present

## 2017-04-18 DIAGNOSIS — Z96651 Presence of right artificial knee joint: Secondary | ICD-10-CM | POA: Diagnosis present

## 2017-04-18 DIAGNOSIS — Z79899 Other long term (current) drug therapy: Secondary | ICD-10-CM | POA: Diagnosis not present

## 2017-04-18 DIAGNOSIS — R0602 Shortness of breath: Secondary | ICD-10-CM | POA: Diagnosis present

## 2017-04-18 DIAGNOSIS — E876 Hypokalemia: Secondary | ICD-10-CM | POA: Diagnosis present

## 2017-04-18 DIAGNOSIS — Z87891 Personal history of nicotine dependence: Secondary | ICD-10-CM | POA: Diagnosis not present

## 2017-04-18 DIAGNOSIS — R05 Cough: Secondary | ICD-10-CM | POA: Diagnosis present

## 2017-04-18 DIAGNOSIS — Z91041 Radiographic dye allergy status: Secondary | ICD-10-CM | POA: Diagnosis not present

## 2017-04-18 DIAGNOSIS — Z8249 Family history of ischemic heart disease and other diseases of the circulatory system: Secondary | ICD-10-CM | POA: Diagnosis not present

## 2017-04-18 DIAGNOSIS — R062 Wheezing: Secondary | ICD-10-CM | POA: Diagnosis present

## 2017-04-18 LAB — CBC WITH DIFFERENTIAL/PLATELET
Basophils Absolute: 0 10*3/uL (ref 0.0–0.1)
Basophils Relative: 0 %
Eosinophils Absolute: 0 10*3/uL (ref 0.0–0.7)
Eosinophils Relative: 0 %
HCT: 36.1 % (ref 36.0–46.0)
Hemoglobin: 11.7 g/dL — ABNORMAL LOW (ref 12.0–15.0)
Lymphocytes Relative: 13 %
Lymphs Abs: 0.9 10*3/uL (ref 0.7–4.0)
MCH: 29.9 pg (ref 26.0–34.0)
MCHC: 32.4 g/dL (ref 30.0–36.0)
MCV: 92.3 fL (ref 78.0–100.0)
Monocytes Absolute: 0.6 10*3/uL (ref 0.1–1.0)
Monocytes Relative: 9 %
Neutro Abs: 5.5 10*3/uL (ref 1.7–7.7)
Neutrophils Relative %: 78 %
Platelets: 130 10*3/uL — ABNORMAL LOW (ref 150–400)
RBC: 3.91 MIL/uL (ref 3.87–5.11)
RDW: 15.8 % — ABNORMAL HIGH (ref 11.5–15.5)
WBC: 7.1 10*3/uL (ref 4.0–10.5)

## 2017-04-18 LAB — BASIC METABOLIC PANEL
Anion gap: 13 (ref 5–15)
BUN: 20 mg/dL (ref 6–20)
CO2: 27 mmol/L (ref 22–32)
Calcium: 9 mg/dL (ref 8.9–10.3)
Chloride: 94 mmol/L — ABNORMAL LOW (ref 101–111)
Creatinine, Ser: 1.23 mg/dL — ABNORMAL HIGH (ref 0.44–1.00)
GFR calc Af Amer: 53 mL/min — ABNORMAL LOW (ref >60)
GFR calc non Af Amer: 46 mL/min — ABNORMAL LOW (ref >60)
Glucose, Bld: 102 mg/dL — ABNORMAL HIGH (ref 65–99)
Potassium: 2.8 mmol/L — ABNORMAL LOW (ref 3.5–5.1)
Sodium: 134 mmol/L — ABNORMAL LOW (ref 135–145)

## 2017-04-18 LAB — TROPONIN I
Troponin I: 0.05 ng/mL (ref <0.03)
Troponin I: 0.04 ng/mL (ref <0.03)
Troponin I: 0.04 ng/mL (ref <0.03)

## 2017-04-18 MED ORDER — PANTOPRAZOLE SODIUM 40 MG PO TBEC
40.0000 mg | DELAYED_RELEASE_TABLET | Freq: Every day | ORAL | Status: DC
  Administered 2017-04-18 – 2017-04-19 (×2): 40 mg via ORAL
  Filled 2017-04-18 (×2): qty 1

## 2017-04-18 MED ORDER — LISINOPRIL 10 MG PO TABS
20.0000 mg | ORAL_TABLET | Freq: Every day | ORAL | Status: DC
  Administered 2017-04-18 – 2017-04-19 (×2): 20 mg via ORAL
  Filled 2017-04-18 (×2): qty 2

## 2017-04-18 MED ORDER — MAGNESIUM OXIDE 400 (241.3 MG) MG PO TABS
400.0000 mg | ORAL_TABLET | Freq: Every day | ORAL | Status: DC
  Administered 2017-04-18 – 2017-04-19 (×2): 400 mg via ORAL
  Filled 2017-04-18 (×2): qty 1

## 2017-04-18 MED ORDER — IBUPROFEN 400 MG PO TABS
400.0000 mg | ORAL_TABLET | Freq: Four times a day (QID) | ORAL | Status: DC | PRN
  Administered 2017-04-18 – 2017-04-19 (×2): 400 mg via ORAL
  Filled 2017-04-18 (×2): qty 1

## 2017-04-18 MED ORDER — METOPROLOL TARTRATE 50 MG PO TABS
75.0000 mg | ORAL_TABLET | Freq: Two times a day (BID) | ORAL | Status: DC
  Administered 2017-04-18 – 2017-04-19 (×3): 75 mg via ORAL
  Filled 2017-04-18 (×3): qty 1

## 2017-04-18 MED ORDER — ALBUTEROL SULFATE (2.5 MG/3ML) 0.083% IN NEBU
2.5000 mg | INHALATION_SOLUTION | Freq: Four times a day (QID) | RESPIRATORY_TRACT | Status: DC
Start: 2017-04-18 — End: 2017-04-19
  Administered 2017-04-18 – 2017-04-19 (×4): 2.5 mg via RESPIRATORY_TRACT
  Filled 2017-04-18 (×4): qty 3

## 2017-04-18 MED ORDER — HYDRALAZINE HCL 20 MG/ML IJ SOLN: 10.0000 mg | INTRAMUSCULAR | Status: DC | PRN

## 2017-04-18 MED ORDER — METHYLPREDNISOLONE SODIUM SUCC 40 MG IJ SOLR
40.0000 mg | Freq: Four times a day (QID) | INTRAMUSCULAR | Status: DC
  Administered 2017-04-18 – 2017-04-19 (×4): 40 mg via INTRAVENOUS
  Filled 2017-04-18 (×4): qty 1

## 2017-04-18 MED ORDER — METHYLPREDNISOLONE SODIUM SUCC 125 MG IJ SOLR
125.0000 mg | Freq: Once | INTRAMUSCULAR | Status: AC
  Administered 2017-04-18: 125 mg via INTRAVENOUS
  Filled 2017-04-18: qty 2

## 2017-04-18 MED ORDER — AZITHROMYCIN 500 MG IV SOLR
INTRAVENOUS | Status: AC
  Filled 2017-04-18: qty 500

## 2017-04-18 NOTE — Progress Notes (Signed)
PROGRESS NOTE    Toni [name]  VHQ:[id number] DOB: 11-12-1955 DOA: 04/17/2017 PCP: Soyla Dryer, PA-C    Brief Narrative: 61 yo with hx of diastolic CHF, afib, MVR, TVR, admitted for PNA on IV Zithromax and Rocephin, having slightly elevated troponin to 0.6, felt not to be due to ACS.  She has significant wheezing, and has been on Lopressor 125mg  BID.    Assessment & Plan:   Principal Problem:   CAP (community acquired pneumonia) Active Problems:   HYPOKALEMIA   Essential hypertension   ATRIAL FIBRILLATION   Chronic diastolic heart failure ([name])   Chronic kidney disease, stage II (mild)   S/P mitral valve repair   S/P Maze operation for atrial fibrillation   S/P tricuspid valve repair   Elevated troponin   1. CAP:  She has wheezing, and will need Neb and Steroid.  Will give QID Neb, IV solumedrol, and continue with IV Rocephin and Zithromax.   2. Elevated troponin:  Unlikely to be ACS.  Will continue with ASA and BB.   3. HTN:  Increase ACE I, decrease BB, and use IV Hydralazine PRN for BP control. 4. CKD:  Stable, Cr of 1.3.  5. Hx of afib:  S/p MAZE.    DVT prophylaxis: SCD. Code Status: FULL CODE.  Family Communication: None.  Disposition Plan: Home.   Consultants: None.     Procedures:   None.   Antimicrobials: Anti-infectives    Start     Dose/Rate Route Frequency Ordered Stop   04/18/17 2200  azithromycin (ZITHROMAX) 500 mg in dextrose 5 % 250 mL IVPB  Status:  Discontinued     500 mg 250 mL/hr over 60 Minutes Intravenous Every 24 hours 04/17/17 2033 04/17/17 2150   04/18/17 2200  cefTRIAXone (ROCEPHIN) 1 g in dextrose 5 % 50 mL IVPB  Status:  Discontinued     1 g 100 mL/hr over 30 Minutes Intravenous Every 24 hours 04/17/17 2033 04/17/17 2149   04/18/17 2100  cefTRIAXone (ROCEPHIN) 1 g in dextrose 5 % 50 mL IVPB     1 g 100 mL/hr over 30 Minutes Intravenous Every 24 hours 04/17/17 2151 04/24/17 2059   04/17/17 2200  cefTRIAXone (ROCEPHIN) 1 g in  dextrose 5 % 50 mL IVPB  Status:  Discontinued     1 g 100 mL/hr over 30 Minutes Intravenous Every 24 hours 04/17/17 2148 04/17/17 2152   04/17/17 2200  azithromycin (ZITHROMAX) 500 mg in dextrose 5 % 250 mL IVPB     500 mg 250 mL/hr over 60 Minutes Intravenous Every 24 hours 04/17/17 2148 04/24/17 2159   04/17/17 2000  cefTRIAXone (ROCEPHIN) 1 g in dextrose 5 % 50 mL IVPB     1 g 100 mL/hr over 30 Minutes Intravenous  Once 04/17/17 1952 04/17/17 2113   04/17/17 2000  azithromycin (ZITHROMAX) 500 mg in dextrose 5 % 250 mL IVPB  Status:  Discontinued     500 mg 250 mL/hr over 60 Minutes Intravenous  Once 04/17/17 1952 04/17/17 2151       Subjective  Doing a little better.    Objective: Vitals:   04/17/17 2015 04/17/17 2030 04/17/17 2100 04/18/17 0500  BP:  130/85 (!) 137/47 (!) 141/71  Pulse: 80 80 83 76  Resp: 17 (!) 31 20 20   Temp:   99 F (37.2 C) 99.7 F (37.6 C)  TempSrc:   Oral Oral  SpO2: 97% 94% 96% 96%  Weight:   105.2 kg (232 lb)  Height:   5\' 8"  (1.727 m)     Intake/Output Summary (Last 24 hours) at 04/18/17 1019 Last data filed at 04/18/17 0959  Gross per 24 hour  Intake              980 ml  Output                0 ml  Net              980 ml   Filed Weights   04/17/17 1515 04/17/17 2100  Weight: 106.6 kg (235 lb) 105.2 kg (232 lb)    Examination:  General exam: Appears calm and comfortable  Respiratory system: wheezing bilaterally. No rales.  Respiratory effort normal. Cardiovascular system: S1 & S2 heard, RRR. No JVD, murmurs, rubs, gallops or clicks. No pedal edema. Gastrointestinal system: Abdomen is nondistended, soft and nontender. No organomegaly or masses felt. Normal bowel sounds heard. Central nervous system: Alert and oriented. No focal neurological deficits. Extremities: Symmetric 5 x 5 power. Skin: No rashes, lesions or ulcers Psychiatry: Judgement and insight appear normal. Mood & affect appropriate.   Data Reviewed: I have personally  reviewed following labs and imaging studies  CBC:  Recent Labs Lab 04/17/17 1558 04/18/17 0514  WBC 7.1 7.1  NEUTROABS 5.8 5.5  HGB 12.4 11.7*  HCT 37.6 36.1  MCV 91.9 92.3  PLT 131* 517*   Basic Metabolic Panel:  Recent Labs Lab 04/17/17 1558 04/17/17 1936 04/18/17 0514  NA 139  --  134*  K 3.2*  --  2.8*  CL 100*  --  94*  CO2 27  --  27  GLUCOSE 112*  --  102*  BUN 20  --  20  CREATININE 1.35*  --  1.23*  CALCIUM 9.6  --  9.0  MG  --  2.0  --    GFR: Estimated Creatinine Clearance: 59.4 mL/min (A) (by C-G formula based on SCr of 1.23 mg/dL (H)). Liver Function Tests:  Recent Labs Lab 04/17/17 1558  AST 47*  ALT 37  ALKPHOS 110  BILITOT 1.0  PROT 8.3*  ALBUMIN 3.8    Recent Labs Lab 04/17/17 1558  LIPASE 32   Cardiac Enzymes:  Recent Labs Lab 04/17/17 1558 04/17/17 1936 04/17/17 2303 04/18/17 0514  TROPONINI 0.06* 0.05* 0.05* 0.04*   Sepsis Labs:  Recent Labs Lab 04/17/17 1558 04/17/17 1936  LATICACIDVEN 1.3 1.5    Recent Results (from the past 240 hour(s))  Culture, blood (routine x 2) Call MD if unable to obtain prior to antibiotics being given     Status: None (Preliminary result)   Collection Time: 04/17/17 10:39 PM  Result Value Ref Range Status   Specimen Description RIGHT ANTECUBITAL  Final   Special Requests Blood Culture adequate volume  Final   Culture PENDING  Incomplete   Report Status PENDING  Incomplete  Culture, blood (routine x 2) Call MD if unable to obtain prior to antibiotics being given     Status: None (Preliminary result)   Collection Time: 04/17/17 11:03 PM  Result Value Ref Range Status   Specimen Description LEFT ANTECUBITAL  Final   Special Requests Blood Culture adequate volume  Final   Culture PENDING  Incomplete   Report Status PENDING  Incomplete     Radiology Studies: Ct Abdomen Pelvis Wo Contrast  Result Date: 04/17/2017 CLINICAL DATA:  Productive cough. Right abdominal pain and nausea for the  past 3 days. EXAM: CT ABDOMEN AND PELVIS WITHOUT CONTRAST  TECHNIQUE: Multidetector CT imaging of the abdomen and pelvis was performed following the standard protocol without IV contrast. COMPARISON:  01/22/2007.  Chest radiographs obtained earlier today. FINDINGS: Lower chest: Minimal patchy opacity in the right middle lobe and minimal tree-in-bud opacity in the right lower lobe, limited by breathing motion blurring. Prosthetic mitral and aortic valves. Enlarged heart. Hepatobiliary: Cholecystectomy clips.  Normal appearing liver. Pancreas: Unremarkable. No pancreatic ductal dilatation or surrounding inflammatory changes. Spleen: Normal in size without focal abnormality. Adrenals/Urinary Tract: Tiny upper pole right renal calculus. Otherwise, normal appearing kidneys, ureters, urinary bladder and adrenal glands. No hydronephrosis. Stomach/Bowel: Small hiatal hernia or prominent esophageal ampulla. Scattered colonic diverticula without evidence of diverticulitis. Surgically absent appendix. Unremarkable small bowel. Vascular/Lymphatic: Atheromatous arterial calcifications without aneurysm. No enlarged lymph nodes. Reproductive: Surgically absent uterus.  Normal appearing ovaries. Other: None. Musculoskeletal: Lumbar and lower thoracic spine degenerative changes. IMPRESSION: 1. Findings suspicious for minimal pneumonia in the right middle lobe and minimal infection in the right lower lobe. 2. Tiny, nonobstructing upper pole right renal calculus. 3. Cardiomegaly. 4. Mild colonic diverticulosis. 5. Small hiatal hernia or prominent esophageal ampulla. Electronically Signed   By: Claudie Revering M.D.   On: 04/17/2017 19:05   Dg Chest 2 View  Result Date: 04/17/2017 CLINICAL DATA:  Chest pain, cough, congestion shortness of breath for 3 days. EXAM: CHEST  2 VIEW COMPARISON:  PA and lateral chest 03/18/2015 and 11/28/2013. FINDINGS: There is cardiomegaly and hazy bilateral airspace opacities likely due to edema. The  patient is status post mitral and tricuspid valve repair. No pneumothorax or pleural effusion. Atherosclerosis noted. No acute abnormality. IMPRESSION: Cardiomegaly and hazy bilateral airspace disease most consistent with pulmonary edema. Atherosclerosis. Electronically Signed   By: Inge Rise M.D.   On: 04/17/2017 15:41    Scheduled Meds: . albuterol  2.5 mg Nebulization QID  . aspirin  325 mg Oral Daily  . FLUoxetine  20 mg Oral Daily  . gabapentin  300 mg Oral q morning - 10a   And  . gabapentin  600 mg Oral QHS  . lisinopril  20 mg Oral Daily  . magnesium oxide  400 mg Oral Daily  . methylPREDNISolone (SOLU-MEDROL) injection  125 mg Intravenous Once  . methylPREDNISolone (SOLU-MEDROL) injection  40 mg Intravenous Q6H  . metoprolol tartrate  75 mg Oral BID  . potassium chloride  10 mEq Oral Daily  . potassium chloride  40 mEq Oral BID  . sodium chloride flush  3 mL Intravenous Q12H  . torsemide  20 mg Oral Daily   Continuous Infusions: . sodium chloride    . azithromycin Stopped (04/17/17 2355)  . cefTRIAXone (ROCEPHIN)  IV       LOS: 0 days   Axel Frisk, MD FACP Hospitalist.   If 7PM-7AM, please contact night-coverage [email or web address] Password [facility] 04/18/2017, 10:19 AM

## 2017-04-18 NOTE — Care Management Note (Signed)
Case Management Note  Patient Details  Name: ARIZA EVANS MRN: [id number] Date of Birth: 1956-06-25  Subjective/Objective: Adm with PNA. From home, ind PTA. Has PCP, but no insurance. Reports getting medication assistance through [facility].                  Action/Plan: Anticipate DC home with self care. CM did discuss [place] voucher with patient. But hopefully DC prescriptions will be affordable.    Expected Discharge Date:  04/18/17               Expected Discharge Plan:  Home/Self Care  In-House Referral:     Discharge planning Services  CM Consult  Post Acute Care Choice:    Choice offered to:     DME Arranged:    DME Agency:     HH Arranged:    HH Agency:     Status of Service:  In process, will continue to follow  If discussed at Long Length of Stay Meetings, dates discussed:    Additional Comments:  Cassidi Modesitt, Chauncey Reading, RN 04/18/2017, 10:48 AM

## 2017-04-19 DIAGNOSIS — J189 Pneumonia, unspecified organism: Principal | ICD-10-CM

## 2017-04-19 MED ORDER — METOPROLOL TARTRATE 25 MG PO TABS: 75.0000 mg | ORAL_TABLET | Freq: Two times a day (BID) | ORAL | 3 refills | Status: DC

## 2017-04-19 MED ORDER — LISINOPRIL 10 MG PO TABS: 20.0000 mg | ORAL_TABLET | Freq: Every day | ORAL | 2 refills | Status: DC

## 2017-04-19 MED ORDER — METOPROLOL TARTRATE 100 MG PO TABS: 50.0000 mg | ORAL_TABLET | Freq: Two times a day (BID) | ORAL | 2 refills | Status: DC

## 2017-04-19 MED ORDER — LEVOFLOXACIN 500 MG PO TABS: 500.0000 mg | ORAL_TABLET | Freq: Every day | ORAL | 0 refills | Status: AC

## 2017-04-19 MED ORDER — ALBUTEROL SULFATE (2.5 MG/3ML) 0.083% IN NEBU: 2.5000 mg | INHALATION_SOLUTION | Freq: Three times a day (TID) | RESPIRATORY_TRACT | Status: DC

## 2017-04-19 NOTE — Progress Notes (Signed)
Pt discharged home today per Dr. Le.  Pt's IV site D/C'd and WDL.  Pt's VSS.  Pt provided with home medication list, discharge instructions and prescriptions.  Verbalized understanding.  Pt left floor via WC in stable condition accompanied by NT. 

## 2017-04-19 NOTE — Progress Notes (Signed)
6 beat run of Vtach. Pt asymptomatic, talking to family on phone. Dr. Marin Comment paged and made aware.

## 2017-04-19 NOTE — Discharge Summary (Signed)
Physician Discharge Summary  Toni [name] KVQ:[id number] DOB: 08-28-1956 DOA: 04/17/2017  PCP: Soyla Dryer, PA-C  Admit date: 04/17/2017 Discharge date: 04/19/2017  Admitted From: Home.  Disposition:  Home.   Recommendations for Outpatient Follow-up:  1. Follow up with PCP in 1-2 weeks 2. Please obtain BMP/CBC in one week 3. Please follow up on the following pending results:  Home Health: None needed.  Equipment/Devices: None.  Discharge Condition: No lightheadedness.   SBP 120.   CODE STATUS: FULL CODE.  Diet recommendation: Heart healthy.   Brief/Interim Summary:  Patient was admitted for CAP and lightheadedness by Dr Shanon Brow on April 17, 2017.  As per her H and P:  " Toni [name] is a 61 y.o. female with medical history significant of daistolic chf, HTN, afib, MVR, TVR comes in with 2-3 days of progressive worsening cough and chest soreness when she coughs and breaths with fevers over 101 and feeling horrible.  No n/v.  Chest pain is right sided along lower lateral rib cage.  Cough has been productive.  Pt found to have pna.  But also trop of 0.06.  Dr Elise Benne was concerned about underlying ACS, [facility]ology at [facility] was called by dr Elise Benne about her chest pain and positive trop, it was recommended to treat her pna.  Pt was therefore referred for admission for further work up.   Review of Systems: As per HPI otherwise 10 point review of systems negative.   HOSPITAL COURSE:  Patient was admitted and she was continued on IV Zithromax and Rocephin for CAP, and she was discharged on Levaquin to finish her antibiotic course.  It was noted that she had slight elevation of her troponins to 0.06, then went down.  Clinically, there was no evidence of ACS, so she was reassured.  She was noted to have signiificant wheezing, and she was given IV steroids, and did not required further steroids, as her wheezing promptly resolved.  I noticed that she has been on high dose of Lopressor, 125mg  BID,  and that she said it was for her HTN.  She also said that Dr Harl Bowie had taken her off before, and her PCP had resumed it for HTN.  Given her bronchospasm and fatigue, I taper it down to 75mg  BID.  She said she felt better.  For her BP, her Lisinopril was increased to 20mg  per day.  With these changes, her SBP was 120.  She is now stable for discharge, and will see her PCP next week, along with her cardiologist as scheduled.  I thank you so much for allowing me to partake in her care.  Good Day.   Discharge Diagnoses:  Principal Problem:   CAP (community acquired pneumonia) Active Problems:   HYPOKALEMIA   Essential hypertension   ATRIAL FIBRILLATION   Chronic diastolic heart failure ([name])   Chronic kidney disease, stage II (mild)   S/P mitral valve repair   S/P Maze operation for atrial fibrillation   S/P tricuspid valve repair   Elevated troponin   PNA (pneumonia)  Discharge Instructions  Discharge Instructions    Diet - low sodium heart healthy    Complete by:  As directed    Discharge instructions    Complete by:  As directed    Follow up with your doctor next week.  Follow up with your cardiologist as schedule.  Motrin can make your blood pressure go up, so be careful.  Finish your antibiotics.   Increase activity slowly  Complete by:  As directed      Allergies as of 04/19/2017      Reactions   Iodinated Diagnostic Agents Swelling   EYES      Medication List    STOP taking these medications   benzonatate 100 MG capsule Commonly known as:  TESSALON PERLES   dimenhyDRINATE 50 MG tablet Commonly known as:  DRAMAMINE   diphenhydrAMINE 25 MG tablet Commonly known as:  BENADRYL   ibuprofen 200 MG tablet Commonly known as:  ADVIL,MOTRIN   Potassium 99 MG Tabs   potassium chloride 10 MEQ tablet Commonly known as:  KLOR-CON 10     TAKE these medications   aspirin 325 MG EC tablet Take 325 mg by mouth daily.   FLUoxetine 20 MG capsule Commonly known as:   PROZAC Take 1 capsule (20 mg total) by mouth daily.   gabapentin 300 MG capsule Commonly known as:  NEURONTIN TAKE 300MG  1 TAB IN AM AND 600MG  2 TABS PM What changed:  how much to take  how to take this  when to take this  additional instructions   indomethacin 25 MG capsule Commonly known as:  INDOCIN 1 po q 8 hour prn gout pain What changed:  how much to take  how to take this  when to take this  reasons to take this  additional instructions   levofloxacin 500 MG tablet Commonly known as:  LEVAQUIN Take 1 tablet (500 mg total) by mouth daily.   lisinopril 10 MG tablet Commonly known as:  PRINIVIL,ZESTRIL Take 2 tablets (20 mg total) by mouth daily. What changed:  how much to take   magnesium gluconate 500 MG tablet Commonly known as:  MAGONATE Take 500 mg by mouth daily.   metoprolol tartrate 100 MG tablet Commonly known as:  LOPRESSOR Take 0.5 tablets (50 mg total) by mouth 2 (two) times daily. What changed:  how much to take   metoprolol tartrate 25 MG tablet Commonly known as:  LOPRESSOR Take 3 tablets (75 mg total) by mouth 2 (two) times daily. (this is taken in addition to 100mg  metoprolol bid) What changed:  how much to take   omeprazole 40 MG capsule Commonly known as:  PRILOSEC Take 1 capsule (40 mg total) by mouth daily.   SALONPAS PAIN RELIEF PATCH EX Apply 1 patch topically daily as needed (pain).   torsemide 20 MG tablet Commonly known as:  DEMADEX Take 1 tablet (20 mg total) by mouth daily.      Follow-up Information    Soyla Dryer, PA-C Follow up on 05/03/2017.   Specialty:  Physician Assistant Why:  Scheduled for 9:30 AM. Contact information: [place] [address] [phone]          Allergies  Allergen Reactions  . Iodinated Diagnostic Agents Swelling    EYES    Consultations:  None.    Procedures/Studies: Ct Abdomen Pelvis Wo Contrast  Result Date: 04/17/2017 CLINICAL DATA:   Productive cough. Right abdominal pain and nausea for the past 3 days. EXAM: CT ABDOMEN AND PELVIS WITHOUT CONTRAST TECHNIQUE: Multidetector CT imaging of the abdomen and pelvis was performed following the standard protocol without IV contrast. COMPARISON:  01/22/2007.  Chest radiographs obtained earlier today. FINDINGS: Lower chest: Minimal patchy opacity in the right middle lobe and minimal tree-in-bud opacity in the right lower lobe, limited by breathing motion blurring. Prosthetic mitral and aortic valves. Enlarged heart. Hepatobiliary: Cholecystectomy clips.  Normal appearing liver. Pancreas: Unremarkable. No pancreatic ductal  dilatation or surrounding inflammatory changes. Spleen: Normal in size without focal abnormality. Adrenals/Urinary Tract: Tiny upper pole right renal calculus. Otherwise, normal appearing kidneys, ureters, urinary bladder and adrenal glands. No hydronephrosis. Stomach/Bowel: Small hiatal hernia or prominent esophageal ampulla. Scattered colonic diverticula without evidence of diverticulitis. Surgically absent appendix. Unremarkable small bowel. Vascular/Lymphatic: Atheromatous arterial calcifications without aneurysm. No enlarged lymph nodes. Reproductive: Surgically absent uterus.  Normal appearing ovaries. Other: None. Musculoskeletal: Lumbar and lower thoracic spine degenerative changes. IMPRESSION: 1. Findings suspicious for minimal pneumonia in the right middle lobe and minimal infection in the right lower lobe. 2. Tiny, nonobstructing upper pole right renal calculus. 3. Cardiomegaly. 4. Mild colonic diverticulosis. 5. Small hiatal hernia or prominent esophageal ampulla. Electronically Signed   By: Claudie Revering M.D.   On: 04/17/2017 19:05   Dg Chest 2 View  Result Date: 04/17/2017 CLINICAL DATA:  Chest pain, cough, congestion shortness of breath for 3 days. EXAM: CHEST  2 VIEW COMPARISON:  PA and lateral chest 03/18/2015 and 11/28/2013. FINDINGS: There is cardiomegaly and hazy  bilateral airspace opacities likely due to edema. The patient is status post mitral and tricuspid valve repair. No pneumothorax or pleural effusion. Atherosclerosis noted. No acute abnormality. IMPRESSION: Cardiomegaly and hazy bilateral airspace disease most consistent with pulmonary edema. Atherosclerosis. Electronically Signed   By: Inge Rise M.D.   On: 04/17/2017 15:41     Subjective: Feels great.  Discharge Exam: Vitals:   04/18/17 2206 04/19/17 0546  BP: 110/65 122/66  Pulse: 62 64  Resp: 20 20  Temp: 98.2 F (36.8 C) 98.2 F (36.8 C)   Vitals:   04/18/17 1952 04/18/17 2206 04/19/17 0546 04/19/17 0739  BP:  110/65 122/66   Pulse:  62 64   Resp:  20 20   Temp:  98.2 F (36.8 C) 98.2 F (36.8 C)   TempSrc:  Oral Oral   SpO2: 95% 95% 97% 97%  Weight:      Height:        General: Pt is alert, awake, not in acute distress Cardiovascular: RRR, S1/S2 +, no rubs, no gallops Respiratory: CTA bilaterally, no wheezing, no rhonchi Abdominal: Soft, NT, ND, bowel sounds + Extremities: no edema, no cyanosis    The results of significant diagnostics from this hospitalization (including imaging, microbiology, ancillary and laboratory) are listed below for reference.     Microbiology: Recent Results (from the past 240 hour(s))  Urine culture     Status: Abnormal (Preliminary result)   Collection Time: 04/17/17  7:16 PM  Result Value Ref Range Status   Specimen Description URINE, CATHETERIZED  Final   Special Requests NONE  Final   Culture >=100,000 COLONIES/mL ESCHERICHIA COLI (A)  Final   Report Status PENDING  Incomplete  Culture, blood (routine x 2) Call MD if unable to obtain prior to antibiotics being given     Status: None (Preliminary result)   Collection Time: 04/17/17 10:39 PM  Result Value Ref Range Status   Specimen Description RIGHT ANTECUBITAL  Final   Special Requests Blood Culture adequate volume  Final   Culture NO GROWTH 2 DAYS  Final   Report Status  PENDING  Incomplete  Culture, blood (routine x 2) Call MD if unable to obtain prior to antibiotics being given     Status: None (Preliminary result)   Collection Time: 04/17/17 11:03 PM  Result Value Ref Range Status   Specimen Description LEFT ANTECUBITAL  Final   Special Requests Blood Culture adequate volume  Final  Culture NO GROWTH 2 DAYS  Final   Report Status PENDING  Incomplete     Labs: BNP (last 3 results)  Recent Labs  04/17/17 1558  BNP 716.9*   Basic Metabolic Panel:  Recent Labs Lab 04/17/17 1558 04/17/17 1936 04/18/17 0514  NA 139  --  134*  K 3.2*  --  2.8*  CL 100*  --  94*  CO2 27  --  27  GLUCOSE 112*  --  102*  BUN 20  --  20  CREATININE 1.35*  --  1.23*  CALCIUM 9.6  --  9.0  MG  --  2.0  --    Liver Function Tests:  Recent Labs Lab 04/17/17 1558  AST 47*  ALT 37  ALKPHOS 110  BILITOT 1.0  PROT 8.3*  ALBUMIN 3.8    Recent Labs Lab 04/17/17 1558  LIPASE 32   CBC:  Recent Labs Lab 04/17/17 1558 04/18/17 0514  WBC 7.1 7.1  NEUTROABS 5.8 5.5  HGB 12.4 11.7*  HCT 37.6 36.1  MCV 91.9 92.3  PLT 131* 130*   Cardiac Enzymes:  Recent Labs Lab 04/17/17 1558 04/17/17 1936 04/17/17 2303 04/18/17 0514 04/18/17 1004  TROPONINI 0.06* 0.05* 0.05* 0.04* 0.04*   Anemia work up No results for input(s): VITAMINB12, FOLATE, FERRITIN, TIBC, IRON, RETICCTPCT in the last 72 hours. Urinalysis    Component Value Date/Time   COLORURINE COLORLESS (A) 04/17/2017 1916   APPEARANCEUR TURBID (A) 04/17/2017 1916   LABSPEC 1.000 (L) 04/17/2017 1916   PHURINE 5.0 04/17/2017 1916   GLUCOSEU NEGATIVE 04/17/2017 1916   HGBUR NEGATIVE 04/17/2017 1916   BILIRUBINUR NEGATIVE 04/17/2017 1916   KETONESUR NEGATIVE 04/17/2017 1916   PROTEINUR NEGATIVE 04/17/2017 1916   NITRITE NEGATIVE 04/17/2017 1916   LEUKOCYTESUR NEGATIVE 04/17/2017 1916   Microbiology Recent Results (from the past 240 hour(s))  Urine culture     Status: Abnormal (Preliminary  result)   Collection Time: 04/17/17  7:16 PM  Result Value Ref Range Status   Specimen Description URINE, CATHETERIZED  Final   Special Requests NONE  Final   Culture >=100,000 COLONIES/mL ESCHERICHIA COLI (A)  Final   Report Status PENDING  Incomplete  Culture, blood (routine x 2) Call MD if unable to obtain prior to antibiotics being given     Status: None (Preliminary result)   Collection Time: 04/17/17 10:39 PM  Result Value Ref Range Status   Specimen Description RIGHT ANTECUBITAL  Final   Special Requests Blood Culture adequate volume  Final   Culture NO GROWTH 2 DAYS  Final   Report Status PENDING  Incomplete  Culture, blood (routine x 2) Call MD if unable to obtain prior to antibiotics being given     Status: None (Preliminary result)   Collection Time: 04/17/17 11:03 PM  Result Value Ref Range Status   Specimen Description LEFT ANTECUBITAL  Final   Special Requests Blood Culture adequate volume  Final   Culture NO GROWTH 2 DAYS  Final   Report Status PENDING  Incomplete    Time coordinating discharge: Over 30 minutes SIGNED:  Orvan Falconer, MD FACP [facility] 04/19/2017, 6:11 PM   If 7PM-7AM, please contact night-coverage [email or web address] Password [email or web address]

## 2017-04-19 NOTE — Care Management (Signed)
Patient discharging today. Discount Coupon for Levaquin given, $15.20 for 10 tablets. No other CM needs.

## 2017-04-20 ENCOUNTER — Other Ambulatory Visit: Payer: Self-pay | Admitting: Physician Assistant

## 2017-04-20 DIAGNOSIS — I10 Essential (primary) hypertension: Secondary | ICD-10-CM

## 2017-04-20 DIAGNOSIS — J189 Pneumonia, unspecified organism: Secondary | ICD-10-CM

## 2017-04-20 LAB — URINE CULTURE: Culture: >=100000 — AB

## 2017-04-22 LAB — CULTURE, BLOOD (ROUTINE X 2)
Culture: NO GROWTH
Culture: NO GROWTH
Special Requests: ADEQUATE
Special Requests: ADEQUATE

## 2017-04-26 ENCOUNTER — Other Ambulatory Visit (HOSPITAL_COMMUNITY)
Admission: RE | Admit: 2017-04-26 | Discharge: 2017-04-26 | Disposition: A | Discharge status: Home / Self Care | Payer: Medicaid Other | Source: Ambulatory Visit | Attending: Physician Assistant | Admitting: Physician Assistant

## 2017-04-26 LAB — BASIC METABOLIC PANEL
Anion gap: 9 (ref 5–15)
BUN: 34 mg/dL — ABNORMAL HIGH (ref 6–20)
CO2: 23 mmol/L (ref 22–32)
Calcium: 9.2 mg/dL (ref 8.9–10.3)
Chloride: 106 mmol/L (ref 101–111)
Creatinine, Ser: 1.48 mg/dL — ABNORMAL HIGH (ref 0.44–1.00)
GFR calc Af Amer: 42 mL/min — ABNORMAL LOW (ref >60)
GFR calc non Af Amer: 37 mL/min — ABNORMAL LOW (ref >60)
Glucose, Bld: 100 mg/dL — ABNORMAL HIGH (ref 65–99)
Potassium: 4.1 mmol/L (ref 3.5–5.1)
Sodium: 138 mmol/L (ref 135–145)

## 2017-04-26 LAB — CBC
HCT: 35.4 % — ABNORMAL LOW (ref 36.0–46.0)
Hemoglobin: 11.6 g/dL — ABNORMAL LOW (ref 12.0–15.0)
MCH: 30.7 pg (ref 26.0–34.0)
MCHC: 32.8 g/dL (ref 30.0–36.0)
MCV: 93.7 fL (ref 78.0–100.0)
Platelets: 186 10*3/uL (ref 150–400)
RBC: 3.78 MIL/uL — ABNORMAL LOW (ref 3.87–5.11)
RDW: 16 % — ABNORMAL HIGH (ref 11.5–15.5)
WBC: 7.1 10*3/uL (ref 4.0–10.5)

## 2017-05-03 ENCOUNTER — Encounter: Payer: Self-pay | Admitting: Physician Assistant

## 2017-05-03 ENCOUNTER — Ambulatory Visit: Payer: Medicaid Other | Admitting: Physician Assistant

## 2017-05-03 VITALS — BP 132/72 | HR 67 | Temp 97.3°F | Resp 19 | Ht 68.0 in | Wt 238.0 lb

## 2017-05-03 DIAGNOSIS — M545 Low back pain: Secondary | ICD-10-CM

## 2017-05-03 DIAGNOSIS — N3090 Cystitis, unspecified without hematuria: Secondary | ICD-10-CM

## 2017-05-03 DIAGNOSIS — I38 Endocarditis, valve unspecified: Secondary | ICD-10-CM

## 2017-05-03 DIAGNOSIS — N189 Chronic kidney disease, unspecified: Secondary | ICD-10-CM

## 2017-05-03 DIAGNOSIS — G8929 Other chronic pain: Secondary | ICD-10-CM

## 2017-05-03 DIAGNOSIS — M109 Gout, unspecified: Secondary | ICD-10-CM

## 2017-05-03 DIAGNOSIS — R609 Edema, unspecified: Secondary | ICD-10-CM

## 2017-05-03 DIAGNOSIS — I10 Essential (primary) hypertension: Secondary | ICD-10-CM

## 2017-05-03 MED ORDER — NITROFURANTOIN MONOHYD MACRO 100 MG PO CAPS: 100.0000 mg | ORAL_CAPSULE | Freq: Two times a day (BID) | ORAL | 0 refills | Status: DC

## 2017-05-03 MED ORDER — METOPROLOL TARTRATE 100 MG PO TABS: 100.0000 mg | ORAL_TABLET | Freq: Two times a day (BID) | ORAL | 2 refills | Status: DC

## 2017-05-03 MED ORDER — ALBUTEROL SULFATE HFA 108 (90 BASE) MCG/ACT IN AERS: 2.0000 | INHALATION_SPRAY | Freq: Four times a day (QID) | RESPIRATORY_TRACT | 0 refills | Status: DC | PRN

## 2017-05-03 MED ORDER — TORSEMIDE 20 MG PO TABS: 20.0000 mg | ORAL_TABLET | Freq: Every day | ORAL | 2 refills | Status: DC

## 2017-05-03 NOTE — Progress Notes (Signed)
BP 132/72 (BP Location: Left Arm, Patient Position: Sitting, Cuff Size: Large)   Pulse 67   Temp 97.3 F (36.3 C) (Other (Comment))   Resp 19   Ht 5\' 8"  (1.727 m)   Wt 238 lb (108 kg)   LMP  (LMP Unknown)   SpO2 94%   BMI 36.19 kg/m    Subjective:    Patient ID: Toni [name], female    DOB: 09/13/1956, 61 y.o.   MRN: [id number]  HPI: Toni [name] is a 61 y.o. female presenting on 05/03/2017 for Hypertension and Follow-up (from hospital, dx with pneumonia, would like to get back on an inhaler)   HPI   Pt discharged from hospital 04/19/17 with CAP. Also hypokalemia, htn, Afib, chf, ckd, elevated troponin.  Pt has f/u with cardiology 07/08/17  Pt feeling better now than when she went into the hospital but says she is still short-winded and having LE edema.  Pt says she had inhaler in the past and it helped her a lot and she requests to get another.  She states occassional soreness in L calf.    Pt is confused about her medications.  She finished the levaquin she got at time of discharge.   Reviewed meds with pt- the instructions for the metoprolol are very confusing.   Pt says she applied for medicaid recently.  She says she has to turn in some more papers which she has gathered and is planning on doing this week.  Pt had most recent echo in December 2017.  Ef 55-60%, L atrial enlargement and MV problems.   Pt states urine more concentrated lately.  She also has urgency.  Urine culture done in hospital. Pt given levaquin on discharge but sensitivity shows resistance to quinolones.   Relevant past medical, surgical, family and social history reviewed and updated as indicated. Interim medical history since our last visit reviewed. Allergies and medications reviewed and updated.   Current Outpatient Prescriptions:  .  aspirin 325 MG EC tablet, Take 325 mg by mouth daily., Disp: , Rfl:  .  FLUoxetine (PROZAC) 20 MG capsule, Take 1 capsule (20 mg total) by mouth daily., Disp: 30  capsule, Rfl: 1 .  gabapentin (NEURONTIN) 300 MG capsule, TAKE 300MG  1 TAB IN AM AND 600MG  2 TABS PM (Patient taking differently: Take 300-600 mg by mouth See admin instructions. TAKE 300MG  1 TAB IN AM AND 600MG  2 TABS PM), Disp: 270 capsule, Rfl: 0 .  indomethacin (INDOCIN) 25 MG capsule, 1 po q 8 hour prn gout pain (Patient taking differently: Take 25 mg by mouth every 8 (eight) hours as needed for mild pain (associated with gout pain). ), Disp: 60 capsule, Rfl: 1 .  Liniments (SALONPAS PAIN RELIEF PATCH EX), Apply 1 patch topically daily as needed (pain)., Disp: , Rfl:  .  lisinopril (PRINIVIL,ZESTRIL) 10 MG tablet, Take 2 tablets (20 mg total) by mouth daily., Disp: 30 tablet, Rfl: 2 .  magnesium gluconate (MAGONATE) 500 MG tablet, Take 500 mg by mouth daily., Disp: , Rfl:  .  metoprolol tartrate (LOPRESSOR) 100 MG tablet, Take 0.5 tablets (50 mg total) by mouth 2 (two) times daily., Disp: 60 tablet, Rfl: 2 .  metoprolol tartrate (LOPRESSOR) 25 MG tablet, Take 3 tablets (75 mg total) by mouth 2 (two) times daily. (this is taken in addition to 100mg  metoprolol bid), Disp: 60 tablet, Rfl: 3 .  omeprazole (PRILOSEC) 40 MG capsule, Take 1 capsule (40 mg total) by mouth daily., Disp:  30 capsule, Rfl: 1 .  torsemide (DEMADEX) 20 MG tablet, Take 1 tablet (20 mg total) by mouth daily., Disp: 30 tablet, Rfl: 2   Review of Systems  Constitutional: Positive for diaphoresis and fatigue. Negative for appetite change, chills, fever and unexpected weight change.  HENT: Positive for congestion and dental problem. Negative for drooling, ear pain, facial swelling, hearing loss, mouth sores, sneezing, sore throat, trouble swallowing and voice change.   Eyes: Positive for visual disturbance. Negative for pain, discharge, redness and itching.  Respiratory: Positive for cough, shortness of breath and wheezing. Negative for choking.   Cardiovascular: Positive for chest pain, palpitations and leg swelling.   Gastrointestinal: Positive for constipation. Negative for abdominal pain, blood in stool, diarrhea and vomiting.  Endocrine: Positive for polydipsia. Negative for cold intolerance and heat intolerance.  Genitourinary: Positive for decreased urine volume. Negative for dysuria and hematuria.  Musculoskeletal: Positive for arthralgias, back pain and gait problem.  Skin: Negative for rash.  Allergic/Immunologic: Negative for environmental allergies.  Neurological: Positive for light-headedness and headaches. Negative for seizures and syncope.  Hematological: Negative for adenopathy.  Psychiatric/Behavioral: Positive for dysphoric mood. Negative for agitation and suicidal ideas. The patient is not nervous/anxious.     Per HPI unless specifically indicated above     Objective:    BP 132/72 (BP Location: Left Arm, Patient Position: Sitting, Cuff Size: Large)   Pulse 67   Temp 97.3 F (36.3 C) (Other (Comment))   Resp 19   Ht 5\' 8"  (1.727 m)   Wt 238 lb (108 kg)   LMP  (LMP Unknown)   SpO2 94%   BMI 36.19 kg/m   Wt Readings from Last 3 Encounters:  05/03/17 238 lb (108 kg)  04/17/17 232 lb (105.2 kg)  03/22/17 237 lb (107.5 kg)    Physical Exam  Constitutional: She is oriented to person, place, and time. She appears well-developed and well-nourished.  HENT:  Head: Normocephalic and atraumatic.  Neck: Neck supple.  Cardiovascular: Normal rate and regular rhythm.   Pulses:      Dorsalis pedis pulses are 2+ on the right side, and 2+ on the left side.  Pulmonary/Chest: Effort normal and breath sounds normal.  Abdominal: Soft. Bowel sounds are normal. She exhibits no mass. There is no hepatosplenomegaly. There is no tenderness.  Musculoskeletal: She exhibits edema.       Right lower leg: She exhibits edema. She exhibits no tenderness.       Left lower leg: She exhibits edema. She exhibits no tenderness and no swelling.  Calves measured- L 43 3/4 cm, R 44cm homan's sign negative   Lymphadenopathy:    She has no cervical adenopathy.  Neurological: She is alert and oriented to person, place, and time.  Skin: Skin is warm and dry.  Psychiatric: She has a normal mood and affect. Her behavior is normal.  Vitals reviewed.       Assessment & Plan:     -bmp, cbc done last week- reviewed with pt -Adjust metoprolol- pt to stop the 25mg  tab and take whole 100mg  tab bid -rx macrobid for urine- gave coupon -Gave coupon for torsemide and rx also -ordered Albuterol mdi from [facility] although discussed with pt that it may not help much as her sob is largely due to her heart -we Will call to see if cardiology appt can be moved from end of august to sometime in July -Pt counseled to elevate feet when seated -encouraged her to Contine walking regularly as  tolerated -pt to follow up in 1 month.  RTO sooner prn any problems

## 2017-05-09 ENCOUNTER — Ambulatory Visit: Payer: Self-pay | Admitting: Adult Health

## 2017-05-10 ENCOUNTER — Encounter: Payer: Self-pay | Admitting: Internal Medicine

## 2017-05-10 ENCOUNTER — Encounter: Payer: Self-pay | Admitting: Gastroenterology

## 2017-05-10 ENCOUNTER — Ambulatory Visit (INDEPENDENT_AMBULATORY_CARE_PROVIDER_SITE_OTHER): Payer: Self-pay | Admitting: Gastroenterology

## 2017-05-10 VITALS — BP 123/75 | HR 69 | Temp 98.2°F | Ht 68.0 in | Wt 234.0 lb

## 2017-05-10 DIAGNOSIS — R1310 Dysphagia, unspecified: Secondary | ICD-10-CM

## 2017-05-10 DIAGNOSIS — F458 Other somatoform disorders: Secondary | ICD-10-CM

## 2017-05-10 DIAGNOSIS — R0989 Other specified symptoms and signs involving the circulatory and respiratory systems: Secondary | ICD-10-CM

## 2017-05-10 DIAGNOSIS — K5900 Constipation, unspecified: Secondary | ICD-10-CM

## 2017-05-10 DIAGNOSIS — R198 Other specified symptoms and signs involving the digestive system and abdomen: Secondary | ICD-10-CM | POA: Insufficient documentation

## 2017-05-10 DIAGNOSIS — R1319 Other dysphagia: Secondary | ICD-10-CM

## 2017-05-10 DIAGNOSIS — D649 Anemia, unspecified: Secondary | ICD-10-CM

## 2017-05-10 MED ORDER — LINACLOTIDE 72 MCG PO CAPS: 72.0000 ug | ORAL_CAPSULE | Freq: Every day | ORAL | 0 refills | Status: DC

## 2017-05-10 NOTE — Patient Instructions (Addendum)
1. Start Linzess 33mcg daily on empty stomach for constipation. Samples provided. Please complete patient assistance forms and send back to [place] as soon as possible. 2. X-ray of your esophagus as scheduled. 3. Please have your bloodwork done in 2 weeks.

## 2017-05-10 NOTE — Assessment & Plan Note (Signed)
Was doing very well on samples of Linzess 60mcg but she never turned patient assistance forms. Samples provided today. New forms provided with our PO Box Mailing address.

## 2017-05-10 NOTE — Assessment & Plan Note (Addendum)
Recent new normocytic anemia. Will recheck labs in 2 weeks including iron and ferritin. Colonoscopy and upper endoscopy up to date.

## 2017-05-10 NOTE — Progress Notes (Signed)
Please make return ov in six months.

## 2017-05-10 NOTE — Assessment & Plan Note (Signed)
Persistent dysphagia to pills/liquids/food, globus sensation. Minimal improvement with EGD with dilation earlier this year. Barium pill esophagram

## 2017-05-10 NOTE — Progress Notes (Signed)
APPOINTMENT MADE °

## 2017-05-10 NOTE — Progress Notes (Signed)
Primary Care Physician: Soyla Dryer, PA-C  Primary Gastroenterologist:  Garfield Cornea, MD   Chief Complaint  Patient presents with  . Dysphagia    pp f/u, some trouble swallowing pills/food, feels like it gets stuck; feels like phlegm in throat (recently in hosp for pneumonia)  . Constipation    out of Linzess samples (Linzess helped, lost paperwork for assistance)  . Abdominal Pain    occurs with constipation; lower mid abd    HPI: Toni [name] is a 61 y.o. female hereFor follow-up. She underwent an EGD and colonoscopy in January 2018. She had a personal history of colon polyps. On colonoscopy she had diverticulosis. Next colonoscopy planned for January 2025. EGD done for dysphagia. She had a moderate-sized hiatal hernia but the esophagus appeared normal. Esophagus dilated for history of dysphagia.  Patient recently discharged from the hospital where she was admitted for community-acquired pneumonia.  At time of discharge her hemoglobin was 11.6 improved from May 2018. She also had a CT abd/pelvis without contrast for right abdominal pain and nausea. Findings suspicious for minimal pneumonia in the right middle lobe and minimal infection in the right lower lobe of the lungs. No other acute findings. Nothing to explain abdominal pain.  Feels like phlegm sitting in the throat. Feels like pills sticking in the throat. Feels like water will not go down. Takes several pills at a time. Feels mostly on left side of neck. Esophageal dilation helped only minimally. She also complains of constipation.  Linzess 24mcg helped but she never returned the patient assistance forms. Having a bowel movement every couple of days. Abdominal pain seems to worsen with constipation improves after bowel movement. No melena rectal bleeding. No nausea or vomiting. No heartburn.  Current Outpatient Prescriptions  Medication Sig Dispense Refill  . albuterol (PROVENTIL HFA;VENTOLIN HFA) 108 (90 Base)  MCG/ACT inhaler Inhale 2 puffs into the lungs every 6 (six) hours as needed for wheezing or shortness of breath. 1 Inhaler 0  . aspirin 325 MG EC tablet Take 325 mg by mouth daily.    Marland Kitchen FLUoxetine (PROZAC) 20 MG capsule Take 1 capsule (20 mg total) by mouth daily. 30 capsule 1  . gabapentin (NEURONTIN) 300 MG capsule TAKE 300MG  1 TAB IN AM AND 600MG  2 TABS PM (Patient taking differently: Take 300-600 mg by mouth See admin instructions. TAKE 300MG  1 TAB IN AM AND 600MG  2 TABS PM) 270 capsule 0  . indomethacin (INDOCIN) 25 MG capsule 1 po q 8 hour prn gout pain (Patient taking differently: Take 25 mg by mouth every 8 (eight) hours as needed for mild pain (associated with gout pain). ) 60 capsule 1  . Liniments (SALONPAS PAIN RELIEF PATCH EX) Apply 1 patch topically daily as needed (pain).    Marland Kitchen lisinopril (PRINIVIL,ZESTRIL) 10 MG tablet Take 2 tablets (20 mg total) by mouth daily. 30 tablet 2  . magnesium gluconate (MAGONATE) 500 MG tablet Take 500 mg by mouth daily.    . metoprolol tartrate (LOPRESSOR) 100 MG tablet Take 1 tablet (100 mg total) by mouth 2 (two) times daily. 60 tablet 2  . nitrofurantoin, macrocrystal-monohydrate, (MACROBID) 100 MG capsule Take 1 capsule (100 mg total) by mouth 2 (two) times daily. 14 capsule 0  . omeprazole (PRILOSEC) 40 MG capsule Take 1 capsule (40 mg total) by mouth daily. 30 capsule 1  . torsemide (DEMADEX) 20 MG tablet Take 1 tablet (20 mg total) by mouth daily. 30 tablet 2   No  current facility-administered medications for this visit.     Allergies as of 05/10/2017 - Review Complete 05/10/2017  Allergen Reaction Noted  . Iodinated diagnostic agents Swelling 09/01/2011    ROS:  General: Negative for anorexia, weight loss, fever, chills, fatigue, weakness. ENT: Negative for hoarseness,  nasal congestion.See history of present illness CV: Negative for chest pain, angina, palpitations, dyspnea on exertion, peripheral edema.  Respiratory: Negative for  dyspnea at rest, dyspnea on exertion, cough, sputum, wheezing.  GI: See history of present illness. GU:  Negative for dysuria, hematuria, urinary incontinence, urinary frequency, nocturnal urination.  Endo: Negative for unusual weight change.    Physical Examination:   BP 123/75   Pulse 69   Temp 98.2 F (36.8 C) (Oral)   Ht 5\' 8"  (1.727 m)   Wt 234 lb (106.1 kg)   LMP  (LMP Unknown)   BMI 35.58 kg/m   General: Well-nourished, well-developed in no acute distress.  Eyes: No icterus. Mouth: Oropharyngeal mucosa moist and pink , no lesions erythema or exudate. Lungs: Clear to auscultation bilaterally.  Heart: Regular rate and rhythm, no murmurs rubs or gallops.  Abdomen: Bowel sounds are normal, nontender, nondistended, no hepatosplenomegaly or masses, no abdominal bruits or hernia , no rebound or guarding.   Extremities: No lower extremity edema. No clubbing or deformities. Neuro: Alert and oriented x 4   Skin: Warm and dry, no jaundice.   Psych: Alert and cooperative, normal mood and affect.  Labs:  Lab Results  Component Value Date   CREATININE 1.48 (H) 04/26/2017   BUN 34 (H) 04/26/2017   NA 138 04/26/2017   K 4.1 04/26/2017   CL 106 04/26/2017   CO2 23 04/26/2017    Lab Results  Component Value Date   WBC 7.1 04/26/2017   HGB 11.6 (L) 04/26/2017   HCT 35.4 (L) 04/26/2017   MCV 93.7 04/26/2017   PLT 186 04/26/2017   No results found for: TSH No results found for: IRON, TIBC, FERRITIN  Imaging Studies: Ct Abdomen Pelvis Wo Contrast  Result Date: 04/17/2017 CLINICAL DATA:  Productive cough. Right abdominal pain and nausea for the past 3 days. EXAM: CT ABDOMEN AND PELVIS WITHOUT CONTRAST TECHNIQUE: Multidetector CT imaging of the abdomen and pelvis was performed following the standard protocol without IV contrast. COMPARISON:  01/22/2007.  Chest radiographs obtained earlier today. FINDINGS: Lower chest: Minimal patchy opacity in the right middle lobe and minimal  tree-in-bud opacity in the right lower lobe, limited by breathing motion blurring. Prosthetic mitral and aortic valves. Enlarged heart. Hepatobiliary: Cholecystectomy clips.  Normal appearing liver. Pancreas: Unremarkable. No pancreatic ductal dilatation or surrounding inflammatory changes. Spleen: Normal in size without focal abnormality. Adrenals/Urinary Tract: Tiny upper pole right renal calculus. Otherwise, normal appearing kidneys, ureters, urinary bladder and adrenal glands. No hydronephrosis. Stomach/Bowel: Small hiatal hernia or prominent esophageal ampulla. Scattered colonic diverticula without evidence of diverticulitis. Surgically absent appendix. Unremarkable small bowel. Vascular/Lymphatic: Atheromatous arterial calcifications without aneurysm. No enlarged lymph nodes. Reproductive: Surgically absent uterus.  Normal appearing ovaries. Other: None. Musculoskeletal: Lumbar and lower thoracic spine degenerative changes. IMPRESSION: 1. Findings suspicious for minimal pneumonia in the right middle lobe and minimal infection in the right lower lobe. 2. Tiny, nonobstructing upper pole right renal calculus. 3. Cardiomegaly. 4. Mild colonic diverticulosis. 5. Small hiatal hernia or prominent esophageal ampulla. Electronically Signed   By: Claudie Revering M.D.   On: 04/17/2017 19:05   Dg Chest 2 View  Result Date: 04/17/2017 CLINICAL DATA:  Chest pain,  cough, congestion shortness of breath for 3 days. EXAM: CHEST  2 VIEW COMPARISON:  PA and lateral chest 03/18/2015 and 11/28/2013. FINDINGS: There is cardiomegaly and hazy bilateral airspace opacities likely due to edema. The patient is status post mitral and tricuspid valve repair. No pneumothorax or pleural effusion. Atherosclerosis noted. No acute abnormality. IMPRESSION: Cardiomegaly and hazy bilateral airspace disease most consistent with pulmonary edema. Atherosclerosis. Electronically Signed   By: Inge Rise M.D.   On: 04/17/2017 15:41

## 2017-05-10 NOTE — Progress Notes (Signed)
cc'ed to pcp °

## 2017-05-12 ENCOUNTER — Ambulatory Visit (HOSPITAL_COMMUNITY)
Admission: RE | Admit: 2017-05-12 | Discharge: 2017-05-12 | Disposition: A | Discharge status: Home / Self Care | Payer: Medicaid Other | Source: Ambulatory Visit | Attending: Gastroenterology | Admitting: Gastroenterology

## 2017-05-12 DIAGNOSIS — R1319 Other dysphagia: Secondary | ICD-10-CM

## 2017-05-12 DIAGNOSIS — R1310 Dysphagia, unspecified: Secondary | ICD-10-CM | POA: Diagnosis present

## 2017-05-12 DIAGNOSIS — K5900 Constipation, unspecified: Secondary | ICD-10-CM

## 2017-05-12 DIAGNOSIS — R198 Other specified symptoms and signs involving the digestive system and abdomen: Secondary | ICD-10-CM

## 2017-05-12 DIAGNOSIS — F458 Other somatoform disorders: Secondary | ICD-10-CM | POA: Insufficient documentation

## 2017-05-12 DIAGNOSIS — D649 Anemia, unspecified: Secondary | ICD-10-CM

## 2017-05-12 DIAGNOSIS — R0989 Other specified symptoms and signs involving the circulatory and respiratory systems: Secondary | ICD-10-CM

## 2017-05-16 ENCOUNTER — Ambulatory Visit (INDEPENDENT_AMBULATORY_CARE_PROVIDER_SITE_OTHER): Payer: Self-pay | Admitting: Adult Health

## 2017-05-16 ENCOUNTER — Encounter: Payer: Self-pay | Admitting: Adult Health

## 2017-05-16 VITALS — BP 168/100 | HR 66 | Ht 67.0 in | Wt 236.0 lb

## 2017-05-16 DIAGNOSIS — I10 Essential (primary) hypertension: Secondary | ICD-10-CM

## 2017-05-16 DIAGNOSIS — Z9889 Other specified postprocedural states: Secondary | ICD-10-CM

## 2017-05-16 DIAGNOSIS — I5032 Chronic diastolic (congestive) heart failure: Secondary | ICD-10-CM

## 2017-05-16 MED ORDER — AMLODIPINE BESYLATE 5 MG PO TABS: 5.0000 mg | ORAL_TABLET | Freq: Every day | ORAL | 3 refills | Status: DC

## 2017-05-16 NOTE — Patient Instructions (Signed)
Medication Instructions:  START AMLODIPINE 5 MG DAILY  Labwork: NONE  Testing/Procedures: NONE  Follow-Up: Your physician recommends that you schedule a follow-up appointment in: [place], NP    Any Other Special Instructions Will Be Listed Below (If Applicable).     If you need a refill on your cardiac medications before your next appointment, please call your pharmacy.

## 2017-05-16 NOTE — Progress Notes (Signed)
Cardiology Office Note   Date:  05/16/2017   ID:  Toni, [name] [birth year], MRN [id number]  PCP:  Soyla Dryer, PA-C  Cardiologist:  Branch  Chief Complaint  Patient presents with  . Cardiac Valve Problem    Mitral   . Congestive Heart Failure    Diastolic  . Hypertension      History of Present Illness: Toni [name] is a 61 y.o. female who presents for ongoing assessment and management of mitral valve disease with previous mitral valve repair at [facility] in April 2016, with a 25 mm Physio II Ring Annuloplasty. Also tricuspid valve repair with a (26 mm Ring Annuloplasty, Cox Maze IV Procedure on 02/27/2015). The patient also had cardiac catheterization prior to valve repair without noted significant CAD. Other history includes atrial fibrillation (maze procedure as above), hypertension.   Patient was last seen in the office by Dr. Harl Bowie on 03/01/2017. The patient was continued on medical management on last evaluation, per Dr. Nelly Laurence note, recent TEE TEE and TEE revealed elevated gradient across her mitral valve, with a mean gradient of 8 across the mitral valve arrest restricted motion of posterior leaflet.   She was advised on increasing her exercise, to ascertain if this will improve her overall stamina and dyspnea. She has not placed on anticoagulation as she remained in normal sinus rhythm post maze procedure. She was to follow-up in 4 months.  She has since been seen in the hospital with acute pneumonia, was found to be hypertensive. Medications were adjusted 2 increase metoprolol 250 mg twice a day. She was sent home on antibiotics. She is followed up with the free clinic, and has been placed on albuterol inhaler. Metoprolol was decreased from 150 mg twice a day to 100 mg twice a day. Since doing so she's noticed that her blood pressure is been elevated. She also states that she has a headache most days. She denies dizziness, visual disturbances, chest pain, or  palpitations.  Past Medical History:  Diagnosis Date  . Congestive heart failure, unspecified    Diastolic heart failure  . Degeneration of lumbar or lumbosacral intervertebral disc   . Depression   . Diverticulosis   . Dyspnea     chronic multifactorial large component of deconditioning  . Esophageal reflux   . Insomnia    Working third shift  . Mitral stenosis    Secondary to under-sized ring annuloplasty following mitral valve repair  . Mitral valve disorders(424.0)    Mild to moderate mitral regurgitation  . Mitral valve insufficiency and aortic valve insufficiency   . Morbid obesity ([place])   . Normocytic anemia   . Other chest pain   . Other dyspnea and respiratory abnormality   . Paroxysmal atrial fibrillation ([name])    Stable no recurrence  . Peripheral edema    chronic  . S/P Maze operation for atrial fibrillation 02/27/2015  . S/P mitral valve repair 02/27/2015   "radical repair" including resection of P2 segment of posterior leaflet with 26 mm Edwards Physio II annuloplasty ring  . S/P tricuspid valve repair 02/27/2015   26 mm annuloplasty ring  . Unspecified essential hypertension     Past Surgical History:  Procedure Laterality Date  . APPENDECTOMY    . BREAST BIOPSY Left 2009  . CARDIAC VALVE SURGERY  02/27/2015   Mikel Cella. Mitral Valve Repair. Radical Repair with resection of P2 Prolapse, 26 mm Physio II Ring Annuloplasty, Tricuspid Valve Repair (26 mm Ring Annuloplasty, Cox Maze IV  Procedure on 02/27/2015  . CHOLECYSTECTOMY    . COLONOSCOPY  2011   Dr. Laural Golden: 23mm polyp from hepatic flexure (tubular adenoma)  . COLONOSCOPY N/A 11/10/2016   Dr. Gala Romney: Diverticulosis. Next colonoscopy 7 years, January 2025  . ESOPHAGOGASTRODUODENOSCOPY  2005    Dr. Gala Romney : normal esophagus s/p dilation. Small hiatal hernia noted. Felt improved after dilation.   . ESOPHAGOGASTRODUODENOSCOPY N/A 11/10/2016   Dr. Gala Romney: Moderate-sized hiatal hernia, esophagus normal status post empiric  dilation for history of dysphagia  . HEEL SPUR SURGERY Right   . MALONEY DILATION N/A 11/10/2016   Procedure: Venia Minks DILATION;  Surgeon: Daneil Dolin, MD;  Location: [facility];  Service: Endoscopy;  Laterality: N/A;  . MINIMALLY INVASIVE TRICUSPID VALVE REPAIR  02/27/2015   [place]  . MITRAL VALVE REPAIR  02/27/2015   [place]  . TEE WITHOUT CARDIOVERSION N/A 10/18/2016   Procedure: TRANSESOPHAGEAL ECHOCARDIOGRAM (TEE);  Surgeon: Arnoldo Lenis, MD;  Location: [facility];  Service: Endoscopy;  Laterality: N/A;  . TOTAL ABDOMINAL HYSTERECTOMY    . TOTAL KNEE ARTHROPLASTY Right   . TUBAL LIGATION       Current Outpatient Prescriptions  Medication Sig Dispense Refill  . albuterol (PROVENTIL HFA;VENTOLIN HFA) 108 (90 Base) MCG/ACT inhaler Inhale 2 puffs into the lungs every 6 (six) hours as needed for wheezing or shortness of breath. 1 Inhaler 0  . aspirin 325 MG EC tablet Take 325 mg by mouth daily.    Marland Kitchen FLUoxetine (PROZAC) 20 MG capsule Take 1 capsule (20 mg total) by mouth daily. 30 capsule 1  . gabapentin (NEURONTIN) 300 MG capsule TAKE 300MG  1 TAB IN AM AND 600MG  2 TABS PM (Patient taking differently: Take 300-600 mg by mouth See admin instructions. TAKE 300MG  1 TAB IN AM AND 600MG  2 TABS PM) 270 capsule 0  . indomethacin (INDOCIN) 25 MG capsule 1 po q 8 hour prn gout pain (Patient taking differently: Take 25 mg by mouth every 8 (eight) hours as needed for mild pain (associated with gout pain). ) 60 capsule 1  . linaclotide (LINZESS) 72 MCG capsule Take 1 capsule (72 mcg total) by mouth daily before breakfast. 20 capsule 0  . Liniments (SALONPAS PAIN RELIEF PATCH EX) Apply 1 patch topically daily as needed (pain).    Marland Kitchen lisinopril (PRINIVIL,ZESTRIL) 10 MG tablet Take 2 tablets (20 mg total) by mouth daily. 30 tablet 2  . magnesium gluconate (MAGONATE) 500 MG tablet Take 500 mg by mouth daily.    . metoprolol tartrate  (LOPRESSOR) 100 MG tablet Take 1 tablet (100 mg total) by mouth 2 (two) times daily. 60 tablet 2  . nitrofurantoin, macrocrystal-monohydrate, (MACROBID) 100 MG capsule Take 1 capsule (100 mg total) by mouth 2 (two) times daily. 14 capsule 0  . omeprazole (PRILOSEC) 40 MG capsule Take 1 capsule (40 mg total) by mouth daily. 30 capsule 1  . torsemide (DEMADEX) 20 MG tablet Take 1 tablet (20 mg total) by mouth daily. 30 tablet 2   No current facility-administered medications for this visit.     Allergies:   Iodinated diagnostic agents    Social History:  The patient  reports that she quit smoking about 21 years ago. Her smoking use included Cigarettes. She has a 12.50 pack-year smoking history. She quit smokeless tobacco use about 43 years ago. Her smokeless tobacco use included Chew. She reports that she does not drink alcohol or use drugs.   Family History:  The patient's family history includes Cancer in her paternal grandmother; Diabetes in her sister; Heart attack in her son; Heart disease in her brother, brother, mother, and sister; Hypertension in her mother, sister, and son; Kidney disease in her father and sister; Stroke in her father.    ROS: All other systems are reviewed and negative. Unless otherwise mentioned in H&P    PHYSICAL EXAM: VS:  BP (!) 168/100   Pulse 66   Ht 5\' 7"  (1.702 m)   Wt 236 lb (107 kg)   LMP  (LMP Unknown)   SpO2 95%   BMI 36.96 kg/m  , BMI Body mass index is 36.96 kg/m. GEN: Well nourished, well developed, in no acute distress  HEENT: normal  Neck: no JVD, carotid bruits, or masses Cardiac: RRR; no murmurs, rubs, or gallops,no edema  Respiratory:  clear to auscultation bilaterally, normal work of breathing GI: soft, nontender, nondistended, + BS MS: no deformity or atrophy  Skin: warm and dry, no rash Neuro:  Strength and sensation are intact Psych: euthymic mood, full affect   Recent Labs: 04/17/2017: ALT 37; B Natriuretic Peptide 759.0;  Magnesium 2.0 04/26/2017: BUN 34; Creatinine, Ser 1.48; Hemoglobin 11.6; Platelets 186; Potassium 4.1; Sodium 138    Lipid Panel    Component Value Date/Time   CHOL 141 08/31/2016 1003   TRIG 68 08/31/2016 1003   HDL 43 (L) 08/31/2016 1003   CHOLHDL 3.3 08/31/2016 1003   VLDL 14 08/31/2016 1003   LDLCALC 84 08/31/2016 1003      Wt Readings from Last 3 Encounters:  05/16/17 236 lb (107 kg)  05/10/17 234 lb (106.1 kg)  05/03/17 238 lb (108 kg)      Other studies Reviewed:  Echocardiogram 08/27/2016 Left ventricle: The cavity size was normal. Wall thickness was   increased in a pattern of mild LVH. Systolic function was normal.   The estimated ejection fraction was in the range of 50% to 55%.   Wall motion was normal; there were no regional wall motion   abnormalities. The study was not technically sufficient to allow   evaluation of LV diastolic dysfunction due to atrial   fibrillation. - Aortic valve: Mildly calcified annulus. Trileaflet; mildly   thickened leaflets. There was mild regurgitation. Valve area   (VTI): 2.47 cm^2. Valve area (Vmax): 2.47 cm^2. Valve area   (Vmean): 2.43 cm^2. - Mitral valve: The is a MV anular ring present s/p mitral valve   repair. There is an increased gradient across the MV. Mean   gradient 12 mmHg. - Left atrium: The atrium was severely dilated. - Right ventricle: The cavity size was mildly dilated. Systolic   function was mildly to moderately reduced. - Right atrium: The atrium was mildly dilated. - Tricuspid valve: A TV anular ring is present. Mean gradient   across the TV of 7 mmHg. - Pulmonary arteries: Systolic pressure was mildly increased. PA   peak pressure: 31 mm Hg (S). - Technically adequate study. ASSESSMENT AND PLAN:  1. Hypertension: Currently not well controlled. Has followed up with PCP and metoprolol was decreased from 150 mg twice a day down to 100 mg twice a day. The patient's blood pressure in PCP office was  120/68. Now blood pressure is 168/100. Heart rate is in the 60s. [facility] on amlodipine 5 mg daily is so going up on metoprolol. She is going to have blood pressure checks throughout the week and come back and see [place] on Friday to compare her blood pressure  readings outside of the office on the medication. We'll see her again in one month for ongoing management. I would prefer not to go up on lisinopril at this time as her most recent creatinine was 1.46.  2. Chronic bronchitis: She is originally started on albuterol inhaler which she takes every 6 hours when necessary wheezing and shortness of breath. This may be contributing to her hypertension with steroidal influence, along with decrease on beta blocker.  3. Chronic diastolic heart failure: Patient's weight is currently controlled and she does not appear to have decompensation weight is up approximately 2 pounds but this may be a difference in scales. I would continue her on her torsemide and low-sodium diet.   4. Mitral valve disease: Most recent echocardiogram revealed LV systolic function 05-18%. Unable to ascertain diastolic function as she was in atrial fibrillation at the time. May consider repeating echocardiogram if blood pressure remains elevated.   Current medicines are reviewed at length with the patient today.    Labs/ tests ordered today include:  Phill Myron. [place] Pugh, ANP, AACC   05/16/2017 3:23 PM    [facility] [address], [place], [place] [address] Phone: [phone]; Fax: [phone]

## 2017-05-18 NOTE — Progress Notes (Signed)
Please let patient know that esophagus is widely open and no evidence of obstruction, pill passed normally.  She needs to chew food thoroughly. Use plenty of liquid when eating and taking medication.  Labs as planned in near future.

## 2017-05-20 ENCOUNTER — Other Ambulatory Visit: Payer: Self-pay | Admitting: Gastroenterology

## 2017-05-20 MED ORDER — LINACLOTIDE 72 MCG PO CAPS: 72.0000 ug | ORAL_CAPSULE | Freq: Every day | ORAL | 3 refills | Status: DC

## 2017-05-23 ENCOUNTER — Ambulatory Visit (INDEPENDENT_AMBULATORY_CARE_PROVIDER_SITE_OTHER): Payer: Self-pay

## 2017-05-23 VITALS — BP 130/72 | HR 61 | Ht 67.0 in | Wt 237.0 lb

## 2017-05-23 DIAGNOSIS — I10 Essential (primary) hypertension: Secondary | ICD-10-CM

## 2017-05-23 MED ORDER — LISINOPRIL 20 MG PO TABS: 20.0000 mg | ORAL_TABLET | Freq: Every day | ORAL | 3 refills | Status: DC

## 2017-05-23 NOTE — Patient Instructions (Signed)
Continue with amlodipine 5 mg daily  Elevate legs whenever sitting  Wear compression stockings which you already have   Follow as scheduled     Thank you for choosing [facility] !

## 2017-05-23 NOTE — Progress Notes (Signed)
Noted, Thanks

## 2017-05-23 NOTE — Progress Notes (Signed)
BP check, c/o ankle swelling after starting amlodipine 5 mg last week   BP 130/72  HR 61    Will discuss with Dr Purcell Nails, she wants patient to remain on amlodipine,elevate legs when sitting and wear compression stockings which she already has

## 2017-05-25 ENCOUNTER — Other Ambulatory Visit: Payer: Self-pay | Admitting: Physician Assistant

## 2017-05-25 ENCOUNTER — Other Ambulatory Visit (HOSPITAL_COMMUNITY)
Admission: RE | Admit: 2017-05-25 | Discharge: 2017-05-25 | Disposition: A | Discharge status: Home / Self Care | Payer: Self-pay | Source: Ambulatory Visit | Attending: Physician Assistant | Admitting: Physician Assistant

## 2017-05-25 DIAGNOSIS — R0989 Other specified symptoms and signs involving the circulatory and respiratory systems: Secondary | ICD-10-CM

## 2017-05-25 DIAGNOSIS — K5900 Constipation, unspecified: Secondary | ICD-10-CM | POA: Insufficient documentation

## 2017-05-25 DIAGNOSIS — R1310 Dysphagia, unspecified: Secondary | ICD-10-CM

## 2017-05-25 DIAGNOSIS — F458 Other somatoform disorders: Secondary | ICD-10-CM | POA: Insufficient documentation

## 2017-05-25 DIAGNOSIS — R198 Other specified symptoms and signs involving the digestive system and abdomen: Secondary | ICD-10-CM

## 2017-05-25 DIAGNOSIS — D649 Anemia, unspecified: Secondary | ICD-10-CM | POA: Insufficient documentation

## 2017-05-25 LAB — BASIC METABOLIC PANEL
Anion gap: 8 (ref 5–15)
BUN: 23 mg/dL — ABNORMAL HIGH (ref 6–20)
CO2: 24 mmol/L (ref 22–32)
Calcium: 9.4 mg/dL (ref 8.9–10.3)
Chloride: 107 mmol/L (ref 101–111)
Creatinine, Ser: 0.97 mg/dL (ref 0.44–1.00)
GFR calc Af Amer: >60 mL/min (ref >60)
GFR calc non Af Amer: >60 mL/min (ref >60)
Glucose, Bld: 76 mg/dL (ref 65–99)
Potassium: 3.9 mmol/L (ref 3.5–5.1)
Sodium: 139 mmol/L (ref 135–145)

## 2017-05-25 LAB — CBC WITH DIFFERENTIAL/PLATELET
Basophils Absolute: 0 10*3/uL (ref 0.0–0.1)
Basophils Relative: 0 %
Eosinophils Absolute: 0 10*3/uL (ref 0.0–0.7)
Eosinophils Relative: 1 %
HCT: 31.4 % — ABNORMAL LOW (ref 36.0–46.0)
Hemoglobin: 10.2 g/dL — ABNORMAL LOW (ref 12.0–15.0)
Lymphocytes Relative: 20 %
Lymphs Abs: 1 10*3/uL (ref 0.7–4.0)
MCH: 30.5 pg (ref 26.0–34.0)
MCHC: 32.5 g/dL (ref 30.0–36.0)
MCV: 94 fL (ref 78.0–100.0)
Monocytes Absolute: 0.3 10*3/uL (ref 0.1–1.0)
Monocytes Relative: 6 %
Neutro Abs: 3.6 10*3/uL (ref 1.7–7.7)
Neutrophils Relative %: 73 %
Platelets: 172 10*3/uL (ref 150–400)
RBC: 3.34 MIL/uL — ABNORMAL LOW (ref 3.87–5.11)
RDW: 15.4 % (ref 11.5–15.5)
WBC: 5 10*3/uL (ref 4.0–10.5)

## 2017-05-25 LAB — IRON AND TIBC
Iron: 68 ug/dL (ref 28–170)
Saturation Ratios: 19 % (ref 10.4–31.8)
TIBC: 357 ug/dL (ref 250–450)
UIBC: 289 ug/dL

## 2017-05-25 LAB — FERRITIN: Ferritin: 64 ng/mL (ref 11–307)

## 2017-05-30 ENCOUNTER — Ambulatory Visit: Payer: Self-pay | Admitting: Physician Assistant

## 2017-05-30 ENCOUNTER — Encounter: Payer: Self-pay | Admitting: Physician Assistant

## 2017-05-30 VITALS — BP 142/78 | HR 65 | Temp 97.7°F | Ht 67.0 in | Wt 238.5 lb

## 2017-05-30 DIAGNOSIS — I10 Essential (primary) hypertension: Secondary | ICD-10-CM

## 2017-05-30 DIAGNOSIS — I5032 Chronic diastolic (congestive) heart failure: Secondary | ICD-10-CM

## 2017-05-30 DIAGNOSIS — I38 Endocarditis, valve unspecified: Secondary | ICD-10-CM

## 2017-05-30 NOTE — Progress Notes (Signed)
BP (!) 142/78   Pulse 65   Temp 97.7 F (36.5 C) (Other (Comment))   Ht 5\' 7"  (1.702 m)   Wt 238 lb 8 oz (108.2 kg)   LMP  (LMP Unknown)   SpO2 95%   BMI 37.35 kg/m    Subjective:    Patient ID: Toni [name], female    DOB: [birth year], 61 y.o.   MRN: [id number]  HPI: Toni [name] is a 61 y.o. female presenting on 05/30/2017 for Follow-up   HPI   Pt has been seen by GI for dysphagia- barium test ordered Pt has been seen by cardiology - their note says felt heart failure stable.  bp meds adjusted.    Pt says she is doing okay today.  No new problems.  Pt has letter stating that she has medicaid.  Our office called and was told that she does have medicaid but it will only be active through the end of september   Relevant past medical, surgical, family and social history reviewed and updated as indicated. Interim medical history since our last visit reviewed. Allergies and medications reviewed and updated.   Current Outpatient Prescriptions:  .  albuterol (PROVENTIL HFA;VENTOLIN HFA) 108 (90 Base) MCG/ACT inhaler, Inhale 2 puffs into the lungs every 6 (six) hours as needed for wheezing or shortness of breath., Disp: 1 Inhaler, Rfl: 0 .  amLODipine (NORVASC) 5 MG tablet, Take 1 tablet (5 mg total) by mouth daily., Disp: 90 tablet, Rfl: 3 .  aspirin 325 MG EC tablet, Take 325 mg by mouth daily., Disp: , Rfl:  .  FLUoxetine (PROZAC) 20 MG capsule, Take 1 capsule (20 mg total) by mouth daily., Disp: 30 capsule, Rfl: 1 .  gabapentin (NEURONTIN) 300 MG capsule, TAKE 300MG  1 TAB IN AM AND 600MG  2 TABS PM (Patient taking differently: Take 300-600 mg by mouth See admin instructions. TAKE 300MG  1 TAB IN AM AND 600MG  2 TABS PM), Disp: 270 capsule, Rfl: 0 .  indomethacin (INDOCIN) 25 MG capsule, 1 po q 8 hour prn gout pain (Patient taking differently: Take 25 mg by mouth every 8 (eight) hours as needed for mild pain (associated with gout pain). ), Disp: 60 capsule, Rfl: 1 .  linaclotide  (LINZESS) 72 MCG capsule, Take 1 capsule (72 mcg total) by mouth daily before breakfast., Disp: 90 capsule, Rfl: 3 .  Liniments (SALONPAS PAIN RELIEF PATCH EX), Apply 1 patch topically daily as needed (pain)., Disp: , Rfl:  .  lisinopril (PRINIVIL,ZESTRIL) 20 MG tablet, Take 1 tablet (20 mg total) by mouth daily., Disp: 90 tablet, Rfl: 3 .  magnesium gluconate (MAGONATE) 500 MG tablet, Take 500 mg by mouth daily., Disp: , Rfl:  .  metoprolol tartrate (LOPRESSOR) 100 MG tablet, Take 1 tablet (100 mg total) by mouth 2 (two) times daily., Disp: 60 tablet, Rfl: 2 .  omeprazole (PRILOSEC) 40 MG capsule, Take 1 capsule (40 mg total) by mouth daily., Disp: 30 capsule, Rfl: 1 .  torsemide (DEMADEX) 20 MG tablet, Take 1 tablet (20 mg total) by mouth daily., Disp: 30 tablet, Rfl: 2   Review of Systems  Constitutional: Positive for appetite change, diaphoresis and fatigue. Negative for chills, fever and unexpected weight change.  HENT: Positive for dental problem. Negative for congestion, drooling, ear pain, facial swelling, hearing loss, mouth sores, sneezing, sore throat, trouble swallowing and voice change.   Eyes: Negative for pain, discharge, redness, itching and visual disturbance.  Respiratory: Positive for shortness of breath and  wheezing. Negative for cough and choking.   Cardiovascular: Positive for leg swelling. Negative for chest pain and palpitations.  Gastrointestinal: Positive for constipation. Negative for abdominal pain, blood in stool, diarrhea and vomiting.  Endocrine: Negative for cold intolerance, heat intolerance and polydipsia.  Genitourinary: Negative for decreased urine volume, dysuria and hematuria.  Musculoskeletal: Positive for back pain and gait problem. Negative for arthralgias.  Skin: Negative for rash.  Allergic/Immunologic: Negative for environmental allergies.  Neurological: Negative for seizures, syncope, light-headedness and headaches.  Hematological: Negative for  adenopathy.  Psychiatric/Behavioral: Negative for agitation, dysphoric mood and suicidal ideas. The patient is not nervous/anxious.     Per HPI unless specifically indicated above     Objective:    BP (!) 142/78   Pulse 65   Temp 97.7 F (36.5 C) (Other (Comment))   Ht 5\' 7"  (1.702 m)   Wt 238 lb 8 oz (108.2 kg)   LMP  (LMP Unknown)   SpO2 95%   BMI 37.35 kg/m   Wt Readings from Last 3 Encounters:  05/30/17 238 lb 8 oz (108.2 kg)  05/23/17 237 lb (107.5 kg)  05/16/17 236 lb (107 kg)    Physical Exam  Constitutional: She is oriented to person, place, and time. She appears well-developed and well-nourished.  HENT:  Head: Normocephalic and atraumatic.  Neck: Neck supple.  Cardiovascular: Normal rate and regular rhythm.   Pulmonary/Chest: Effort normal and breath sounds normal.  Abdominal: Soft. Bowel sounds are normal. She exhibits no mass. There is no hepatosplenomegaly. There is no tenderness.  Musculoskeletal: She exhibits no edema.  Lymphadenopathy:    She has no cervical adenopathy.  Neurological: She is alert and oriented to person, place, and time.  Skin: Skin is warm and dry.  Psychiatric: She has a normal mood and affect. Her behavior is normal.  Vitals reviewed.       Assessment & Plan:    Encounter Diagnoses  Name Primary?  . Hypertension, unspecified type Yes  . Valvular heart disease   . Chronic diastolic heart failure ([place])     -pt is encouraged to take her medicaid letter to the Gastroenterology and cardiology offices so that they can file.   -bp mildly elevated today but will not change any medications today -pt to continue with specialists as scheduled -pt to follow up here in 3 months.  RTO sooner prn

## 2017-06-17 ENCOUNTER — Ambulatory Visit (INDEPENDENT_AMBULATORY_CARE_PROVIDER_SITE_OTHER): Payer: Medicaid Other | Admitting: Adult Health

## 2017-06-17 ENCOUNTER — Encounter: Payer: Self-pay | Admitting: Adult Health

## 2017-06-17 VITALS — BP 140/80 | HR 58 | Ht 67.0 in | Wt 238.4 lb

## 2017-06-17 DIAGNOSIS — R0602 Shortness of breath: Secondary | ICD-10-CM

## 2017-06-17 DIAGNOSIS — Z9889 Other specified postprocedural states: Secondary | ICD-10-CM | POA: Diagnosis not present

## 2017-06-17 DIAGNOSIS — R0600 Dyspnea, unspecified: Secondary | ICD-10-CM

## 2017-06-17 DIAGNOSIS — F5083 Pica in adults: Secondary | ICD-10-CM

## 2017-06-17 DIAGNOSIS — I10 Essential (primary) hypertension: Secondary | ICD-10-CM

## 2017-06-17 DIAGNOSIS — F5089 Other specified eating disorder: Secondary | ICD-10-CM

## 2017-06-17 MED ORDER — METOPROLOL TARTRATE 100 MG PO TABS: 100.0000 mg | ORAL_TABLET | Freq: Two times a day (BID) | ORAL | 3 refills | Status: DC

## 2017-06-17 MED ORDER — AMLODIPINE BESYLATE 5 MG PO TABS: 5.0000 mg | ORAL_TABLET | Freq: Every day | ORAL | 3 refills | Status: DC

## 2017-06-17 MED ORDER — LISINOPRIL 20 MG PO TABS: 20.0000 mg | ORAL_TABLET | Freq: Every day | ORAL | 3 refills | Status: DC

## 2017-06-17 MED ORDER — TORSEMIDE 20 MG PO TABS
20.0000 mg | ORAL_TABLET | Freq: Every day | ORAL | 3 refills | Status: DC
Start: 2017-06-17 — End: 2018-01-13

## 2017-06-17 NOTE — Patient Instructions (Addendum)
Medication Instructions:   Your physician recommends that you continue on your current medications as directed. Please refer to the Current Medication list given to you today.  Labwork:  NONE  Testing/Procedures: Your physician has recommended that you have a pulmonary function test. Pulmonary Function Tests are a group of tests that measure how well air moves in and out of your lungs.  Follow-Up:  Your physician recommends that you schedule a follow-up appointment in: 6 months. You will receive a reminder letter in the mail in about 4 months reminding you to call and schedule your appointment. If you don't receive this letter, please contact our office.  Any Other Special Instructions Will Be Listed Below (If Applicable).  If you need a refill on your cardiac medications before your next appointment, please call your pharmacy.

## 2017-06-17 NOTE — Progress Notes (Signed)
Cardiology Office Note   Date:  06/17/2017   ID:  Toni, [name] 10/16/1956, MRN [id number]  PCP:  Toni Dryer, PA-C  Cardiologist:  Toni [name] Complaint  Patient presents with  . Cardiac Valve Problem  . Hypertension      History of Present Illness: Toni [name] is a 61 y.o. female who presents for ongoing assessment and management of mitral valve disease, status post mitral valve repair in 2016 at [facility], this is a a 25 mm Physio II Ring Annuloplasty, tricuspid valve repair:(26 mm Ring Annuloplasty, Cox Maze IV Procedure on 02/27/2015), other history includes atrial fibrillation status post Maze procedure, and hypertension.  She was last seen in the office on 05/16/2017, after admission to the hospital for acute pneumonia, and found to be hypertensive at that time. The patient was continued on metoprolol but this was decreased to 100 mg twice a day. She was also placed on amlodipine. She was to follow-up for ongoing management as her blood pressure cuff at home was reading her blood pressures lower than in our office visit. She followed up with her primary care physician with blood pressure recording of 142/78.  She comes today complaining of pain in her right foot related to gout. She also has continued constipation. She is medically compliant. She has no complaints of chest pain. She also is having some wheezing and dyspnea on exertion.  Of note, the patient has also admitted to eating starch from the box, a couple teaspoons a day.   Past Medical History:  Diagnosis Date  . Congestive heart failure, unspecified    Diastolic heart failure  . Degeneration of lumbar or lumbosacral intervertebral disc   . Depression   . Diverticulosis   . Dyspnea     chronic multifactorial large component of deconditioning  . Esophageal reflux   . Insomnia    Working third shift  . Mitral stenosis    Secondary to under-sized ring annuloplasty following mitral valve repair    . Mitral valve disorders(424.0)    Mild to moderate mitral regurgitation  . Mitral valve insufficiency and aortic valve insufficiency   . Morbid obesity ([place])   . Normocytic anemia   . Other chest pain   . Other dyspnea and respiratory abnormality   . Paroxysmal atrial fibrillation ([name])    Stable no recurrence  . Peripheral edema    chronic  . S/P Maze operation for atrial fibrillation 02/27/2015  . S/P mitral valve repair 02/27/2015   "radical repair" including resection of P2 segment of posterior leaflet with 26 mm Edwards Physio II annuloplasty ring  . S/P tricuspid valve repair 02/27/2015   26 mm annuloplasty ring  . Unspecified essential hypertension     Past Surgical History:  Procedure Laterality Date  . APPENDECTOMY    . BREAST BIOPSY Left 2009  . CARDIAC VALVE SURGERY  02/27/2015   Mikel Cella. Mitral Valve Repair. Radical Repair with resection of P2 Prolapse, 26 mm Physio II Ring Annuloplasty, Tricuspid Valve Repair (26 mm Ring Annuloplasty, Cox Maze IV Procedure on 02/27/2015  . CHOLECYSTECTOMY    . COLONOSCOPY  2011   Dr. Laural Golden: 62mm polyp from hepatic flexure (tubular adenoma)  . COLONOSCOPY N/A 11/10/2016   Dr. Gala Romney: Diverticulosis. Next colonoscopy 7 years, January 2025  . ESOPHAGOGASTRODUODENOSCOPY  2005    Dr. Gala Romney : normal esophagus s/p dilation. Small hiatal hernia noted. Felt improved after dilation.   . ESOPHAGOGASTRODUODENOSCOPY N/A 11/10/2016   Dr. Gala Romney: Moderate-sized hiatal hernia, esophagus  normal status post empiric dilation for history of dysphagia  . HEEL SPUR SURGERY Right   . MALONEY DILATION N/A 11/10/2016   Procedure: Venia Minks DILATION;  Surgeon: Daneil Dolin, MD;  [facility];  Service: Endoscopy;  Laterality: N/A;  . MINIMALLY INVASIVE TRICUSPID VALVE REPAIR  02/27/2015   [place]  . MITRAL VALVE REPAIR  02/27/2015   [place]  . TEE WITHOUT CARDIOVERSION N/A 10/18/2016   Procedure:  TRANSESOPHAGEAL ECHOCARDIOGRAM (TEE);  Surgeon: Arnoldo Lenis, MD;  Location: [facility];  Service: Endoscopy;  Laterality: N/A;  . TOTAL ABDOMINAL HYSTERECTOMY    . TOTAL KNEE ARTHROPLASTY Right   . TUBAL LIGATION       Current Outpatient Prescriptions  Medication Sig Dispense Refill  . albuterol (PROVENTIL HFA;VENTOLIN HFA) 108 (90 Base) MCG/ACT inhaler Inhale 2 puffs into the lungs every 6 (six) hours as needed for wheezing or shortness of breath. 1 Inhaler 0  . amLODipine (NORVASC) 5 MG tablet Take 1 tablet (5 mg total) by mouth daily. 90 tablet 3  . aspirin 325 MG EC tablet Take 325 mg by mouth daily.    Marland Kitchen FLUoxetine (PROZAC) 20 MG capsule Take 1 capsule (20 mg total) by mouth daily. 30 capsule 1  . gabapentin (NEURONTIN) 300 MG capsule TAKE 300MG  1 TAB IN AM AND 600MG  2 TABS PM (Patient taking differently: Take 300-600 mg by mouth See admin instructions. TAKE 300MG  1 TAB IN AM AND 600MG  2 TABS PM) 270 capsule 0  . indomethacin (INDOCIN) 25 MG capsule 1 po q 8 hour prn gout pain (Patient taking differently: Take 25 mg by mouth every 8 (eight) hours as needed for mild pain (associated with gout pain). ) 60 capsule 1  . linaclotide (LINZESS) 72 MCG capsule Take 1 capsule (72 mcg total) by mouth daily before breakfast. 90 capsule 3  . Liniments (SALONPAS PAIN RELIEF PATCH EX) Apply 1 patch topically daily as needed (pain).    Marland Kitchen lisinopril (PRINIVIL,ZESTRIL) 20 MG tablet Take 1 tablet (20 mg total) by mouth daily. 90 tablet 3  . magnesium gluconate (MAGONATE) 500 MG tablet Take 500 mg by mouth daily.    . metoprolol tartrate (LOPRESSOR) 100 MG tablet Take 1 tablet (100 mg total) by mouth 2 (two) times daily. 60 tablet 2  . omeprazole (PRILOSEC) 40 MG capsule Take 1 capsule (40 mg total) by mouth daily. 30 capsule 1  . torsemide (DEMADEX) 20 MG tablet Take 1 tablet (20 mg total) by mouth daily. 30 tablet 2   No current facility-administered medications for this visit.     Allergies:    Iodinated diagnostic agents    Social History:  The patient  reports that she quit smoking about 21 years ago. Her smoking use included Cigarettes. She has a 12.50 pack-year smoking history. She quit smokeless tobacco use about 43 years ago. Her smokeless tobacco use included Chew. She reports that she does not drink alcohol or use drugs.   Family History:  The patient's family history includes Cancer in her paternal grandmother; Diabetes in her sister; Heart attack in her son; Heart disease in her brother, brother, mother, and sister; Hypertension in her mother, sister, and son; Kidney disease in her father and sister; Stroke in her father.    ROS: All other systems are reviewed and negative. Unless otherwise mentioned in H&P    PHYSICAL EXAM: VS:  BP 140/80   Pulse (!) 58   Ht 5\' 7"  (  1.702 m)   Wt 238 lb 6.4 oz (108.1 kg)   LMP  (LMP Unknown)   SpO2 92% Comment: on room air  BMI 37.34 kg/m  , BMI Body mass index is 37.34 kg/m. GEN: Well nourished, well developed, in no acute distress  HEENT: normal  Neck: no JVD, carotid bruits, or masses Cardiac: RRR; 1/6 systolic murmur, rubs, or gallops,no edema  Respiratory:  clear to auscultation bilaterally, normal work of breathing GI: soft, nontender, nondistended, + BS MS: no deformity or atrophy right foot slightly edematous and painful when standing. Skin: warm and dry, no rash Neuro:  Strength and sensation are intact Psych: euthymic mood, full affect   Recent Labs: 04/17/2017: ALT 37; B Natriuretic Peptide 759.0; Magnesium 2.0 05/25/2017: BUN 23; Creatinine, Ser 0.97; Hemoglobin 10.2; Platelets 172; Potassium 3.9; Sodium 139    Lipid Panel    Component Value Date/Time   CHOL 141 08/31/2016 1003   TRIG 68 08/31/2016 1003   HDL 43 (L) 08/31/2016 1003   CHOLHDL 3.3 08/31/2016 1003   VLDL 14 08/31/2016 1003   LDLCALC 84 08/31/2016 1003      Wt Readings from Last 3 Encounters:  06/17/17 238 lb 6.4 oz (108.1 kg)  05/30/17  238 lb 8 oz (108.2 kg)  05/23/17 237 lb (107.5 kg)      Other studies Reviewed: Echocardiogram September 05, 2016 Left ventricle: The cavity size was normal. Wall thickness was   increased in a pattern of mild LVH. Systolic function was normal.   The estimated ejection fraction was in the range of 50% to 55%.   Wall motion was normal; there were no regional wall motion   abnormalities. The study was not technically sufficient to allow   evaluation of LV diastolic dysfunction due to atrial   fibrillation. - Aortic valve: Mildly calcified annulus. Trileaflet; mildly   thickened leaflets. There was mild regurgitation. Valve area   (VTI): 2.47 cm^2. Valve area (Vmax): 2.47 cm^2. Valve area   (Vmean): 2.43 cm^2. - Mitral valve: The is a MV anular ring present s/p mitral valve   repair. There is an increased gradient across the MV. Mean   gradient 12 mmHg. - Left atrium: The atrium was severely dilated. - Right ventricle: The cavity size was mildly dilated. Systolic   function was mildly to moderately reduced. - Right atrium: The atrium was mildly dilated. - Tricuspid valve: A TV anular ring is present. Mean gradient   across the TV of 7 mmHg. - Pulmonary arteries: Systolic pressure was mildly increased. PA   peak pressure: 31 mm Hg (S). - Technically adequate study.  ASSESSMENT AND PLAN:  1.  Severe mitral valve disease: The patient has been referred to Dr. Ricard Dillon for discussion of need for repair or replacement. I have read as noted he feels that she is too high risk surgery to have mitral valve replacement. He feels that the risks outweigh the benefits overall and would not improve quality of life. We will continue to treat her medically with heart rate control blood pressure control and fluid balance.  I have reviewed recent labs on 05/25/2017, sodium 139 potassium 3.9 chloride 107 CO2 24 glucose 76 BUN 23 creatinine 0.97.  She is given refills on amlodipine, lisinopril, metoprolol, and  torsemide.  2. Dyspnea on exertion: Complaining of wheezing as well. She does have an albuterol inhaler but has never had official PFTs or lung capacity testing. We will have this ordered and she will follow up with primary care to have  advisement on whether or not she should referred to pulmonary medicine.  3. Gouty arthritis: Remains on allopurinol. To be followed by primary  4. Amylophagia: Patient states she eats several teaspoons of starch a day. I've advised her against this. I have told her that this is dangerous for kidneys, spleen, liver, and overall health status. She verbalizes understanding.    Current medicines are reviewed at length with the patient today.    Labs/ tests ordered today include:  Phill Myron. [place] Pugh, ANP, AACC   06/17/2017 1:28 PM    [facility] [address] [place], [place] [address] Phone: [phone]; Fax: [phone]

## 2017-06-19 NOTE — Progress Notes (Signed)
No IDA based on labs. Hemoglobin essentially stable. Suspect anemia of chronic disease but let's have her complete ifobt.

## 2017-06-22 ENCOUNTER — Ambulatory Visit (HOSPITAL_COMMUNITY)
Admission: RE | Admit: 2017-06-22 | Discharge: 2017-06-22 | Disposition: A | Discharge status: Home / Self Care | Payer: Medicaid Other | Source: Ambulatory Visit | Attending: Adult Health | Admitting: Adult Health

## 2017-06-22 DIAGNOSIS — R0600 Dyspnea, unspecified: Secondary | ICD-10-CM | POA: Diagnosis not present

## 2017-06-22 DIAGNOSIS — R0602 Shortness of breath: Secondary | ICD-10-CM

## 2017-06-22 MED ORDER — ALBUTEROL SULFATE (2.5 MG/3ML) 0.083% IN NEBU
2.5000 mg | INHALATION_SOLUTION | Freq: Once | RESPIRATORY_TRACT | Status: AC
  Administered 2017-06-22: 2.5 mg via RESPIRATORY_TRACT

## 2017-06-23 LAB — PULMONARY FUNCTION TEST
DL/VA % pred: 72 %
DL/VA: 3.71 ml/min/mmHg/L
DLCO cor % pred: 45 %
DLCO cor: 12.99 ml/min/mmHg
DLCO unc % pred: 45 %
DLCO unc: 12.99 ml/min/mmHg
FEF 25-75 Post: 1.93 L/sec
FEF 25-75 Pre: 2.64 L/sec
FEF2575-%Change-Post: -26 %
FEF2575-%Pred-Post: 89 %
FEF2575-%Pred-Pre: 122 %
FEV1-%Change-Post: -6 %
FEV1-%Pred-Post: 85 %
FEV1-%Pred-Pre: 91 %
FEV1-Post: 1.95 L
FEV1-Pre: 2.09 L
FEV1FVC-%Change-Post: 4 %
FEV1FVC-%Pred-Pre: 107 %
FEV6-%Change-Post: -10 %
FEV6-%Pred-Post: 78 %
FEV6-%Pred-Pre: 87 %
FEV6-Post: 2.2 L
FEV6-Pre: 2.46 L
FEV6FVC-%Pred-Post: 103 %
FEV6FVC-%Pred-Pre: 103 %
FVC-%Change-Post: -10 %
FVC-%Pred-Post: 75 %
FVC-%Pred-Pre: 84 %
FVC-Post: 2.2 L
FVC-Pre: 2.46 L
Post FEV1/FVC ratio: 89 %
Post FEV6/FVC ratio: 100 %
Pre FEV1/FVC ratio: 85 %
Pre FEV6/FVC Ratio: 100 %
RV % pred: 83 %
RV: 1.84 L
TLC % pred: 60 %
TLC: 3.35 L

## 2017-06-27 ENCOUNTER — Ambulatory Visit (INDEPENDENT_AMBULATORY_CARE_PROVIDER_SITE_OTHER): Payer: Medicaid Other | Admitting: Gastroenterology

## 2017-06-27 ENCOUNTER — Telehealth: Payer: Self-pay | Admitting: *Deleted

## 2017-06-27 DIAGNOSIS — D582 Other hemoglobinopathies: Secondary | ICD-10-CM

## 2017-06-27 DIAGNOSIS — R942 Abnormal results of pulmonary function studies: Secondary | ICD-10-CM

## 2017-06-27 LAB — IFOBT (OCCULT BLOOD): IFOBT: NEGATIVE

## 2017-06-27 NOTE — Telephone Encounter (Signed)
-----   Message from Lendon Colonel, NP sent at 06/24/2017  7:15 AM EDT ----- Abnormal PFT for carbon dioxide exchange. Will need referral to Dr. Luan Pulling or pulmonology in [facility] for further treatment recommendations please.

## 2017-06-28 ENCOUNTER — Other Ambulatory Visit: Payer: Self-pay | Admitting: Physician Assistant

## 2017-06-28 NOTE — Progress Notes (Signed)
Heme negative. Repeat cbc in 2 months.

## 2017-06-28 NOTE — Progress Notes (Signed)
See result note.  

## 2017-06-29 ENCOUNTER — Other Ambulatory Visit: Payer: Self-pay | Admitting: Gastroenterology

## 2017-06-29 ENCOUNTER — Other Ambulatory Visit: Payer: Self-pay | Admitting: Physician Assistant

## 2017-06-29 ENCOUNTER — Telehealth: Payer: Self-pay | Admitting: Student

## 2017-06-29 DIAGNOSIS — D649 Anemia, unspecified: Secondary | ICD-10-CM

## 2017-06-29 MED ORDER — ALBUTEROL SULFATE HFA 108 (90 BASE) MCG/ACT IN AERS: 2.0000 | INHALATION_SPRAY | Freq: Four times a day (QID) | RESPIRATORY_TRACT | 3 refills | Status: AC | PRN

## 2017-06-29 NOTE — Telephone Encounter (Signed)
Called pt regarding refill request received from [facility] for Proventil Inh. Pt has medicaid effective 05-27-17.  Pt was notified that we will fill her proventil inh prescription this last time, but will send to a local pharmacy of her choice ( pt prefers [facility]) due to having medicaid and no longer qualifying to receive assistance from [place] verbalized understanding. [facility] has been contacted and pt has been removed from their approved patient list.  Pt was also notified that her appointment with [facility] scheduled for 08-30-2017 at 10am will be cancelled due to her now not qualifying to be an [facility] patient due to having medicaid. pt was instructed to establish care elsewhere and was given [facility]'s phone number [phone] with Dr. Meda Coffee as an option for patient to call. Pt verbalized understanding.

## 2017-06-30 ENCOUNTER — Telehealth: Payer: Self-pay | Admitting: Cardiology

## 2017-06-30 NOTE — Telephone Encounter (Signed)
Pt wanted to know if she needs to hold any medications prior to teeth cleaning. Please advise.

## 2017-06-30 NOTE — Telephone Encounter (Signed)
No need to hold any medications prior to teeth cleaning

## 2017-06-30 NOTE — Telephone Encounter (Signed)
Pt made aware, she voiced understanding.  

## 2017-06-30 NOTE — Telephone Encounter (Signed)
Pt lvm wanting to speak w/ someone about her medications--can be reached @ [phone]

## 2017-07-01 ENCOUNTER — Telehealth: Payer: Self-pay | Admitting: Cardiology

## 2017-07-01 NOTE — Telephone Encounter (Signed)
[facility] Dept states that pt is to have teeth cleaning and filling.

## 2017-07-01 NOTE — Telephone Encounter (Signed)
Needs to know if patient needs any pre meds or to stop any meds prior to dental procedure / tg

## 2017-07-01 NOTE — Telephone Encounter (Signed)
Called Tasha. No answer. Left message to call back.

## 2017-07-04 NOTE — Telephone Encounter (Signed)
Patient will need 2g of amoxicillin x 1 dose 30 minutes prior to procedure. We dont have any antibiotic allergies listed for her, please confirm this.   Zandra Abts MD

## 2017-07-05 NOTE — Telephone Encounter (Signed)
Aniceto Boss at [place] notified and voiced understanding.

## 2017-07-06 ENCOUNTER — Other Ambulatory Visit: Payer: Self-pay | Admitting: Physician Assistant

## 2017-07-06 MED ORDER — OMEPRAZOLE 40 MG PO CPDR: 40.0000 mg | DELAYED_RELEASE_CAPSULE | Freq: Every day | ORAL | 0 refills | Status: DC

## 2017-07-06 MED ORDER — FLUOXETINE HCL 20 MG PO CAPS: 20.0000 mg | ORAL_CAPSULE | Freq: Every day | ORAL | 0 refills | Status: DC

## 2017-07-08 ENCOUNTER — Ambulatory Visit: Payer: Self-pay | Admitting: Cardiology

## 2017-07-14 ENCOUNTER — Other Ambulatory Visit: Payer: Self-pay

## 2017-07-14 DIAGNOSIS — D649 Anemia, unspecified: Secondary | ICD-10-CM

## 2017-07-22 ENCOUNTER — Other Ambulatory Visit (HOSPITAL_COMMUNITY): Payer: Self-pay | Admitting: Pulmonary Disease

## 2017-07-22 DIAGNOSIS — R942 Abnormal results of pulmonary function studies: Secondary | ICD-10-CM

## 2017-07-28 ENCOUNTER — Encounter (HOSPITAL_COMMUNITY): Payer: Self-pay

## 2017-07-28 ENCOUNTER — Ambulatory Visit (HOSPITAL_COMMUNITY): Payer: Medicaid Other

## 2017-08-03 ENCOUNTER — Encounter (HOSPITAL_COMMUNITY): Payer: Self-pay | Admitting: Emergency Medicine

## 2017-08-03 ENCOUNTER — Emergency Department (HOSPITAL_COMMUNITY)
Admission: EM | Admit: 2017-08-03 | Discharge: 2017-08-03 | Disposition: A | Discharge status: Home / Self Care | Payer: Medicaid Other | Attending: Emergency Medicine | Admitting: Emergency Medicine

## 2017-08-03 ENCOUNTER — Ambulatory Visit: Payer: Medicaid Other | Admitting: Family Medicine

## 2017-08-03 ENCOUNTER — Encounter: Payer: Self-pay | Admitting: Family Medicine

## 2017-08-03 ENCOUNTER — Ambulatory Visit (INDEPENDENT_AMBULATORY_CARE_PROVIDER_SITE_OTHER): Payer: Medicaid Other | Admitting: Family Medicine

## 2017-08-03 ENCOUNTER — Telehealth: Payer: Self-pay | Admitting: Family Medicine

## 2017-08-03 VITALS — BP 142/72 | HR 70 | Temp 98.0°F | Resp 16 | Ht 67.0 in | Wt 238.1 lb

## 2017-08-03 DIAGNOSIS — X58XXXA Exposure to other specified factors, initial encounter: Secondary | ICD-10-CM | POA: Insufficient documentation

## 2017-08-03 DIAGNOSIS — I130 Hypertensive heart and chronic kidney disease with heart failure and stage 1 through stage 4 chronic kidney disease, or unspecified chronic kidney disease: Secondary | ICD-10-CM | POA: Insufficient documentation

## 2017-08-03 DIAGNOSIS — M25571 Pain in right ankle and joints of right foot: Secondary | ICD-10-CM | POA: Insufficient documentation

## 2017-08-03 DIAGNOSIS — M10071 Idiopathic gout, right ankle and foot: Secondary | ICD-10-CM

## 2017-08-03 DIAGNOSIS — N182 Chronic kidney disease, stage 2 (mild): Secondary | ICD-10-CM | POA: Diagnosis not present

## 2017-08-03 DIAGNOSIS — Z79899 Other long term (current) drug therapy: Secondary | ICD-10-CM | POA: Diagnosis not present

## 2017-08-03 DIAGNOSIS — H579 Unspecified disorder of eye and adnexa: Secondary | ICD-10-CM | POA: Diagnosis not present

## 2017-08-03 DIAGNOSIS — S0592XA Unspecified injury of left eye and orbit, initial encounter: Secondary | ICD-10-CM | POA: Diagnosis present

## 2017-08-03 DIAGNOSIS — G8929 Other chronic pain: Secondary | ICD-10-CM | POA: Insufficient documentation

## 2017-08-03 DIAGNOSIS — S0502XA Injury of conjunctiva and corneal abrasion without foreign body, left eye, initial encounter: Secondary | ICD-10-CM | POA: Insufficient documentation

## 2017-08-03 DIAGNOSIS — Z96651 Presence of right artificial knee joint: Secondary | ICD-10-CM | POA: Diagnosis not present

## 2017-08-03 DIAGNOSIS — H5712 Ocular pain, left eye: Secondary | ICD-10-CM | POA: Diagnosis not present

## 2017-08-03 DIAGNOSIS — Y929 Unspecified place or not applicable: Secondary | ICD-10-CM | POA: Diagnosis not present

## 2017-08-03 DIAGNOSIS — Z7982 Long term (current) use of aspirin: Secondary | ICD-10-CM | POA: Diagnosis not present

## 2017-08-03 DIAGNOSIS — Y939 Activity, unspecified: Secondary | ICD-10-CM | POA: Diagnosis not present

## 2017-08-03 DIAGNOSIS — I5032 Chronic diastolic (congestive) heart failure: Secondary | ICD-10-CM | POA: Insufficient documentation

## 2017-08-03 DIAGNOSIS — Y999 Unspecified external cause status: Secondary | ICD-10-CM | POA: Diagnosis not present

## 2017-08-03 MED ORDER — FLUOXETINE HCL 20 MG PO CAPS: 20.0000 mg | ORAL_CAPSULE | Freq: Every day | ORAL | 0 refills | Status: DC

## 2017-08-03 MED ORDER — NAPROXEN 250 MG PO TABS
500.0000 mg | ORAL_TABLET | Freq: Once | ORAL | Status: AC
  Administered 2017-08-03: 500 mg via ORAL
  Filled 2017-08-03: qty 2

## 2017-08-03 MED ORDER — INDOMETHACIN 25 MG PO CAPS: ORAL_CAPSULE | ORAL | 1 refills | Status: DC

## 2017-08-03 MED ORDER — TETRACAINE HCL 0.5 % OP SOLN
2.0000 [drp] | Freq: Once | OPHTHALMIC | Status: DC
  Filled 2017-08-03: qty 4

## 2017-08-03 MED ORDER — TOBRAMYCIN 0.3 % OP SOLN
1.0000 [drp] | Freq: Once | OPHTHALMIC | Status: AC
  Administered 2017-08-03: 1 [drp] via OPHTHALMIC
  Filled 2017-08-03: qty 5

## 2017-08-03 MED ORDER — OMEPRAZOLE 40 MG PO CPDR: 40.0000 mg | DELAYED_RELEASE_CAPSULE | Freq: Every day | ORAL | 0 refills | Status: DC

## 2017-08-03 NOTE — Patient Instructions (Signed)
You are scheduled at [facility] 08/04/2017 at 930 am They are located at [address], [place], [place] [address] [phone]

## 2017-08-03 NOTE — ED Provider Notes (Signed)
[place] DEPT Provider Note   CSN: [id number] Arrival date & time: 08/03/17  1156     History   Chief Complaint Chief Complaint  Patient presents with  . Eye Pain    HPI Toni [name] is a 61 y.o. female.  HPI   Toni [name] is a 61 y.o. female who presents to the Emergency Department complaining of left eye pain and tearing.  States that she felt a foreign body sensation to her eye after putting on a sweater.  She was seen by her PCP shortly before ER arrival and scheduled to f/u with ophthalmology on Friday.  Comes here for further evaluation.  Also complains of pain to her right ankle which has been persistent for "a long time.". No injury or calf pain.  PCP prescribed NSAID, pt has not tried the medication yet.  She denies visual loss, headache, dizziness, facial weakness and difficulty speaking.   Past Medical History:  Diagnosis Date  . Congestive heart failure, unspecified    Diastolic heart failure  . Degeneration of lumbar or lumbosacral intervertebral disc   . Depression   . Diverticulosis   . Dyspnea     chronic multifactorial large component of deconditioning  . Esophageal reflux   . Insomnia    Working third shift  . Mitral stenosis    Secondary to under-sized ring annuloplasty following mitral valve repair  . Mitral valve disorders(424.0)    Mild to moderate mitral regurgitation  . Mitral valve insufficiency and aortic valve insufficiency   . Morbid obesity ([place])   . Normocytic anemia   . Other chest pain   . Other dyspnea and respiratory abnormality   . Paroxysmal atrial fibrillation ([name])    Stable no recurrence  . Peripheral edema    chronic  . S/P Maze operation for atrial fibrillation 02/27/2015  . S/P mitral valve repair 02/27/2015   "radical repair" including resection of P2 segment of posterior leaflet with 26 mm Edwards Physio II annuloplasty ring  . S/P tricuspid valve repair 02/27/2015   26 mm annuloplasty ring  . Unspecified  essential hypertension     Patient Active Problem List   Diagnosis Date Noted  . Globus sensation 05/10/2017  . PNA (pneumonia) 04/18/2017  . CAP (community acquired pneumonia) 04/17/2017  . Elevated troponin 04/17/2017  . Mitral stenosis   . Dysphagia 10/20/2016  . Constipation 10/20/2016  . S/P mitral valve repair 02/27/2015  . S/P Maze operation for atrial fibrillation 02/27/2015  . S/P tricuspid valve repair 02/27/2015  . Chronic kidney disease, stage II (mild)   . Insomnia 09/02/2011  . Chronic back pain 09/02/2011  . EDEMA 01/18/2011  . HYPOKALEMIA 09/17/2010  . ANEMIA 09/11/2010  . Chronic diastolic heart failure ([place]) 08/28/2010  . OTH NONSPECIFIC ABNORM CV SYSTEM FUNCTION STUDY 08/11/2010  . PRECORDIAL PAIN 07/30/2010  . SHORTNESS OF BREATH 12/26/2009  . ACUTE ON CHRONIC DIASTOLIC HEART FAILURE 18/29/9371  . MITRAL REGURGITATION 07/07/2009  . Essential hypertension 07/07/2009  . ATRIAL FIBRILLATION 07/07/2009    Past Surgical History:  Procedure Laterality Date  . APPENDECTOMY    . BREAST BIOPSY Left 2009  . CARDIAC VALVE SURGERY  02/27/2015   Mikel Cella. Mitral Valve Repair. Radical Repair with resection of P2 Prolapse, 26 mm Physio II Ring Annuloplasty, Tricuspid Valve Repair (26 mm Ring Annuloplasty, Cox Maze IV Procedure on 02/27/2015  . CHOLECYSTECTOMY    . COLONOSCOPY  2011   Dr. Laural Golden: 67mm polyp from hepatic flexure (tubular adenoma)  .  COLONOSCOPY N/A 11/10/2016   Dr. Gala Romney: Diverticulosis. Next colonoscopy 7 years, January 2025  . ESOPHAGOGASTRODUODENOSCOPY  2005    Dr. Gala Romney : normal esophagus s/p dilation. Small hiatal hernia noted. Felt improved after dilation.   . ESOPHAGOGASTRODUODENOSCOPY N/A 11/10/2016   Dr. Gala Romney: Moderate-sized hiatal hernia, esophagus normal status post empiric dilation for history of dysphagia  . HEEL SPUR SURGERY Right   . MALONEY DILATION N/A 11/10/2016   Procedure: Venia Minks DILATION;  Surgeon: Daneil Dolin, MD;  Location: [facility]  ENDO [facility];  Service: Endoscopy;  Laterality: N/A;  . MINIMALLY INVASIVE TRICUSPID VALVE REPAIR  02/27/2015   [place]  . MITRAL VALVE REPAIR  02/27/2015   [place]  . TEE WITHOUT CARDIOVERSION N/A 10/18/2016   Procedure: TRANSESOPHAGEAL ECHOCARDIOGRAM (TEE);  Surgeon: Arnoldo Lenis, MD;  Location[facility];  Service: Endoscopy;  Laterality: N/A;  . TOTAL ABDOMINAL HYSTERECTOMY    . TOTAL KNEE ARTHROPLASTY Right   . TUBAL LIGATION      OB History    No data available       Home Medications    Prior to Admission medications   Medication Sig Start Date End Date Taking? Authorizing Provider  albuterol (PROVENTIL HFA;VENTOLIN HFA) 108 (90 Base) MCG/ACT inhaler Inhale 2 puffs into the lungs every 6 (six) hours as needed for wheezing or shortness of breath. 06/29/17   Soyla Dryer, PA-C  amLODipine (NORVASC) 5 MG tablet Take 1 tablet (5 mg total) by mouth daily. 06/17/17   Lendon Colonel, NP  aspirin 325 MG EC tablet Take 325 mg by mouth daily.    [provider]  FLUoxetine (PROZAC) 20 MG capsule Take 1 capsule (20 mg total) by mouth daily. 07/06/17   Soyla Dryer, PA-C  gabapentin (NEURONTIN) 300 MG capsule TAKE 300MG  1 TAB IN AM AND 600MG  2 TABS PM Patient taking differently: Take 300-600 mg by mouth See admin instructions. TAKE 300MG  1 TAB IN AM AND 600MG  2 TABS PM 04/06/17   Arnoldo Lenis, MD  indomethacin (INDOCIN) 25 MG capsule 1 po q 8 hour prn gout pain 08/03/17   Caren Macadam, MD  linaclotide [facility]) 72 MCG capsule Take 1 capsule (72 mcg total) by mouth daily before breakfast. 05/20/17   Mahala Menghini, PA-C  Liniments (SALONPAS PAIN RELIEF PATCH EX) Apply 1 patch topically daily as needed (pain).    [provider]  lisinopril (PRINIVIL,ZESTRIL) 20 MG tablet Take 1 tablet (20 mg total) by mouth daily. 06/17/17 09/15/17  Lendon Colonel, NP  magnesium gluconate (MAGONATE) 500 MG  tablet Take 500 mg by mouth daily.    [provider]  metoprolol tartrate (LOPRESSOR) 100 MG tablet Take 1 tablet (100 mg total) by mouth 2 (two) times daily. 06/17/17   Lendon Colonel, NP  omeprazole (PRILOSEC) 40 MG capsule Take 1 capsule (40 mg total) by mouth daily. 07/06/17   Soyla Dryer, PA-C  torsemide (DEMADEX) 20 MG tablet Take 1 tablet (20 mg total) by mouth daily. 06/17/17   Lendon Colonel, NP    Family History Family History  Problem Relation Age of Onset  . Hypertension Mother   . Heart disease Mother   . Kidney disease Father   . Stroke Father   . Diabetes Sister   . Kidney disease Sister   . Heart disease Sister   . Hypertension Sister   . Heart disease Brother   . Heart attack Son   . Hypertension  Son   . Cancer Paternal Grandmother        Breast Cancer  . Heart disease Brother   . Colon cancer Neg Hx     Social History Social History  Substance Use Topics  . Smoking status: Former Smoker    Packs/day: 0.50    Years: 25.00    Types: Cigarettes    Quit date: 1997  . Smokeless tobacco: Former Systems developer    Types: [place] date: 1975     Comment: dipped tobacco during pregancy in 1975  . Alcohol use No     Allergies   Iodinated diagnostic agents   Review of Systems Review of Systems  Constitutional: Negative for chills and fever.  HENT: Negative for congestion and trouble swallowing.   Eyes: Positive for pain. Negative for photophobia, redness and itching.  Respiratory: Negative for cough and shortness of breath.   Cardiovascular: Negative for chest pain.  Musculoskeletal: Positive for arthralgias. Negative for back pain and joint swelling.  Skin: Negative for color change and wound.  Neurological: Negative for dizziness, seizures, facial asymmetry, speech difficulty, weakness, light-headedness, numbness and headaches.  Psychiatric/Behavioral: Negative for confusion.  All other systems reviewed and are negative.    Physical  Exam Updated Vital Signs BP (!) 164/71 (BP Location: Left Arm)   Pulse 60   Temp 98.8 F (37.1 C) (Oral)   Resp 16   LMP  (LMP Unknown)   SpO2 95%   Physical Exam  Constitutional: She is oriented to person, place, and time. She appears well-developed and well-nourished. No distress.  HENT:  Head: Normocephalic and atraumatic.  Mouth/Throat: Oropharynx is clear and moist.  Eyes: Pupils are equal, round, and reactive to light. Conjunctivae and EOM are normal. Lids are everted and swept, no foreign bodies found. Right conjunctiva is not injected. Left conjunctiva is not injected.  Slit lamp exam reveals small corneal abrasion to 3 o'clock positive. negative Seidel's sign, no hyphema or FB. Globe intact  Neck: Normal range of motion. Neck supple. No thyromegaly present.  Cardiovascular: Normal rate, regular rhythm, normal heart sounds and intact distal pulses.   No murmur heard. Pulmonary/Chest: Effort normal and breath sounds normal. No respiratory distress.  Musculoskeletal: Normal range of motion. She exhibits tenderness.  ttp of anterior right ankle.  No edema, erythema.  No proximal tenderness or edema.  Lymphadenopathy:    She has no cervical adenopathy.  Neurological: She is alert and oriented to person, place, and time. No sensory deficit. She exhibits normal muscle tone. Coordination normal.  Skin: Skin is warm and dry. No rash noted.  Nursing note and vitals reviewed.    ED Treatments / Results  Labs (all labs ordered are listed, but only abnormal results are displayed) Labs Reviewed - No data to display  EKG  EKG Interpretation None       Radiology No results found.  Procedures Procedures (including critical care time)  Medications Ordered in ED Medications  tetracaine (PONTOCAINE) 0.5 % ophthalmic solution 2 drop (not administered)     Initial Impression / Assessment and Plan / ED Course  I have reviewed the triage vital signs and the nursing  notes.  Pertinent labs & imaging results that were available during my care of the patient were reviewed by me and considered in my medical decision making (see chart for details).     Patient was seen by her PCP this morning and recommended to follow-up with ophthalmology. She has an appointment tomorrow with  ophthalmology in [place]. No foreign bodies seen on funduscopic exam. Probable corneal abrasion. Will dispense tobramycin drops she agrees to ophthalmology follow-up on Friday. Appears stable for discharge.  Final Clinical Impressions(s) / ED Diagnoses   Final diagnoses:  Abrasion of left cornea, initial encounter  Chronic pain of right ankle    New Prescriptions New Prescriptions   No medications on file     Kem Parkinson, Hershal Coria 08/05/17 2122    Francine Graven, DO 08/06/17 2318

## 2017-08-03 NOTE — Progress Notes (Signed)
Patient ID: Toni [name], female    DOB: 1955-12-25, 61 y.o.   MRN: [id number]  Chief Complaint  Patient presents with  . Eye Pain    states it feels like something stuck in there  . Foot Pain    right, gout?    Allergies Iodinated diagnostic agents  Subjective:   Toni [name] is a 61 y.o. female who presents to [facility] today.  HPI Reports that last night when was getting ready for bed at 10:30 pm something flew in her eye or got in her left eye. Was sitting on the bed at that time, and it started bothering her. Not sure if it was a bug or lint. Now feels like something is scratching eye and like something is in her left eye. Used eye drops saline and it did not help. Feels like when blinks everytime that something is scratching eye. Feels like there is a film over her eye. Does not have an eye doctor. Wears glasses just to read. Does not have a headache. Nothing makes it better or worse. Eye is watering.   2 days ago the right foot started hurting. Has a history of gout and ran out of medications. Pain worse when walks and nothing makes it better. Pain is over the top of her foot. Has been to the Ed several times for gout in this foot in the same place. Has not taken any of the medicine b/c out of it, did not want to take the ibuprofen b/c it really does not help. Pain in foot is bad with sitting and just being still but worse with movement.    Eye Pain   The left eye is affected. This is a new problem. The current episode started yesterday. The problem occurs constantly. The problem has been rapidly worsening. There was no injury mechanism. The pain is at a severity of 8/10. The pain is moderate. There is no known exposure to pink eye. She does not wear contacts. Associated symptoms include a foreign body sensation. Pertinent negatives include no blurred vision, eye discharge, double vision, eye redness, fever, itching, nausea, photophobia, recent URI, vomiting or  weakness. She has tried eye drops for the symptoms. The treatment provided no relief.  Foot Pain  This is a new problem. The current episode started in the past 7 days. The problem occurs constantly. The problem has been gradually worsening. Associated symptoms include joint swelling. Pertinent negatives include no fever, myalgias, nausea, vomiting or weakness. The symptoms are aggravated by walking and standing. She has tried rest and NSAIDs for the symptoms. The treatment provided no relief.    Past Medical History:  Diagnosis Date  . Congestive heart failure, unspecified    Diastolic heart failure  . Degeneration of lumbar or lumbosacral intervertebral disc   . Depression   . Diverticulosis   . Dyspnea     chronic multifactorial large component of deconditioning  . Esophageal reflux   . Insomnia    Working third shift  . Mitral stenosis    Secondary to under-sized ring annuloplasty following mitral valve repair  . Mitral valve disorders(424.0)    Mild to moderate mitral regurgitation  . Mitral valve insufficiency and aortic valve insufficiency   . Morbid obesity ([place])   . Normocytic anemia   . Other chest pain   . Other dyspnea and respiratory abnormality   . Paroxysmal atrial fibrillation ([name])    Stable no recurrence  . Peripheral edema  chronic  . S/P Maze operation for atrial fibrillation 02/27/2015  . S/P mitral valve repair 02/27/2015   "radical repair" including resection of P2 segment of posterior leaflet with 26 mm Edwards Physio II annuloplasty ring  . S/P tricuspid valve repair 02/27/2015   26 mm annuloplasty ring  . Unspecified essential hypertension     Past Surgical History:  Procedure Laterality Date  . APPENDECTOMY    . BREAST BIOPSY Left 2009  . CARDIAC VALVE SURGERY  02/27/2015   Mikel Cella. Mitral Valve Repair. Radical Repair with resection of P2 Prolapse, 26 mm Physio II Ring Annuloplasty, Tricuspid Valve Repair (26 mm Ring Annuloplasty, Cox Maze IV  Procedure on 02/27/2015  . CHOLECYSTECTOMY    . COLONOSCOPY  2011   Dr. Laural Golden: 48mm polyp from hepatic flexure (tubular adenoma)  . COLONOSCOPY N/A 11/10/2016   Dr. Gala Romney: Diverticulosis. Next colonoscopy 7 years, January 2025  . ESOPHAGOGASTRODUODENOSCOPY  2005    Dr. Gala Romney : normal esophagus s/p dilation. Small hiatal hernia noted. Felt improved after dilation.   . ESOPHAGOGASTRODUODENOSCOPY N/A 11/10/2016   Dr. Gala Romney: Moderate-sized hiatal hernia, esophagus normal status post empiric dilation for history of dysphagia  . HEEL SPUR SURGERY Right   . MALONEY DILATION N/A 11/10/2016   Procedure: Venia Minks DILATION;  Surgeon: Daneil Dolin, MD;  [facility];  Service: Endoscopy;  Laterality: N/A;  . MINIMALLY INVASIVE TRICUSPID VALVE REPAIR  02/27/2015   [place]  . MITRAL VALVE REPAIR  02/27/2015   [place]  . TEE WITHOUT CARDIOVERSION N/A 10/18/2016   Procedure: TRANSESOPHAGEAL ECHOCARDIOGRAM (TEE);  Surgeon: Arnoldo Lenis, MD;  [facility]: [facility];  Service: Endoscopy;  Laterality: N/A;  . TOTAL ABDOMINAL HYSTERECTOMY    . TOTAL KNEE ARTHROPLASTY Right   . TUBAL LIGATION      Family History  Problem Relation Age of Onset  . Hypertension Mother   . Heart disease Mother   . Kidney disease Father   . Stroke Father   . Diabetes Sister   . Kidney disease Sister   . Heart disease Sister   . Hypertension Sister   . Heart disease Brother   . Heart attack Son   . Hypertension Son   . Cancer Paternal Grandmother        Breast Cancer  . Heart disease Brother   . Colon cancer Neg Hx      Social History   Social History  . Marital status: Single    Spouse name: N/A  . Number of children: N/A  . Years of education: N/A   Social History Main Topics  . Smoking status: Former Smoker    Packs/day: 0.50    Years: 25.00    Types: Cigarettes    Quit date: 1997  . Smokeless tobacco: Former Systems developer    Types: [place] date: 1975     Comment: dipped tobacco during pregancy in 1975  . Alcohol use No  . Drug use: No  . Sexual activity: Yes    Partners: Male   Other Topics Concern  . None   Social History Narrative  . None    Review of Systems  Constitutional: Negative for fever.  Eyes: Positive for pain. Negative for blurred vision, double vision, photophobia, discharge, redness and itching.  Gastrointestinal: Negative for nausea and vomiting.  Musculoskeletal: Positive for joint swelling. Negative for myalgias.  Neurological: Negative for weakness.     Objective:   BP Marland Kitchen)  142/72 (BP Location: Left Arm, Patient Position: Sitting, Cuff Size: Normal)   Pulse 70   Temp 98 F (36.7 C) (Other (Comment))   Resp 16   Ht 5\' 7"  (1.702 m)   Wt 238 lb 1.9 oz (108 kg)   LMP  (LMP Unknown)   SpO2 96%   BMI 37.29 kg/m   Physical Exam  Constitutional: She appears well-developed and well-nourished. No distress.  HENT:  Head: Normocephalic and atraumatic.  Eyes: Pupils are equal, round, and reactive to light. Conjunctivae and lids are normal. Right eye exhibits no discharge, no exudate and no hordeolum. No foreign body present in the right eye. Left eye exhibits discharge. Left eye exhibits no exudate and no hordeolum. No foreign body present in the left eye. Right conjunctiva is not injected. Right conjunctiva has no hemorrhage. Left conjunctiva is not injected. Left conjunctiva has no hemorrhage. No scleral icterus. Right eye exhibits normal extraocular motion. Left eye exhibits normal extraocular motion.  Fundoscopic exam:      The right eye shows no papilledema. The right eye shows red reflex.       The left eye shows no papilledema. The left eye shows red reflex.  Left eye watering and patient keeping it closed due to pain.   Cardiovascular:  Pulses:      Dorsalis pedis pulses are 2+ on the right side, and 2+ on the left side.  Musculoskeletal:       Right foot: There is tenderness, bony  tenderness and swelling. There is normal range of motion, normal capillary refill, no crepitus, no deformity and no laceration.       Feet:  Pain over top of right foot/fore foot, cutaneous temperature difference with right foot greater than left.   Feet:  Right Foot:  Skin Integrity: Positive for warmth. Negative for ulcer, blister, skin breakdown, erythema, callus or dry skin.  Left Foot:  Skin Integrity: Negative for ulcer, blister, skin breakdown, erythema, warmth, callus or dry skin.     Assessment and Plan   1. Eye pain, left  - Ambulatory referral to Ophthalmology -discussed with patient that she needs to be seen by eye doctor today. I told her that I cannot see a FB in eye but that she could have scratched the cornea of eye or there could be a foreign body. Agreed to be seen.   2. Sensation of foreign body in eye  - Ambulatory referral to Ophthalmology  3. Idiopathic gout of right foot, unspecified chronicity Multiple visits to ED by patient for this foot pain. Today, will defer xrays due to eye issue and b/c of chronicity, multiple medical problems, and risks of gout medication and location of pain not usual for gout. Will refer to orothopedics for xray and evaluation.  Patient counseled in detail regarding the risks of medication. Told to call or return to clinic if develop any worrisome signs or symptoms. Patient voiced understanding.   - indomethacin (INDOCIN) 25 MG capsule; 1 po q 8 hour prn gout pain  Dispense: 20 capsule; Refill: 1 - Ambulatory referral to Orthopedic Surgery  Patient to follow up in 2 weeks for regular medical problems and evaluation.  Bring in all medications.    No Follow-up on file. Caren Macadam, MD 08/03/2017

## 2017-08-03 NOTE — Telephone Encounter (Signed)
Advise patient that these were refilled. Will need to follow up to discuss b/c of the issues she had today and she was a new patient visit, we did not discuss these in detail.

## 2017-08-03 NOTE — ED Notes (Signed)
Pt also c/o right foot pain. Denies pain.ble swelling but her normal. pcp rx gabapentin today when seen by PCP

## 2017-08-03 NOTE — Telephone Encounter (Signed)
Patient would like refill on fluoxetine and omeprazole.

## 2017-08-03 NOTE — Discharge Instructions (Signed)
Apply one drop of the tobramycin to your left eye every 4 hours for 5-7 days. Be sure to follow-up with your ophthalmologist tomorrow. Apply ice packs on and off to your ankle and wear the brace as needed for support.

## 2017-08-03 NOTE — ED Triage Notes (Signed)
Sent from PCP due to left eye pain. No redness or swelling noted. States felt like something in it last night and scratching it everytime she blinks

## 2017-08-04 NOTE — Telephone Encounter (Signed)
Patient was seen yesterday and needs 2 week follow up scheduled.

## 2017-08-16 ENCOUNTER — Ambulatory Visit (HOSPITAL_COMMUNITY)
Admission: RE | Admit: 2017-08-16 | Discharge: 2017-08-16 | Disposition: A | Discharge status: Home / Self Care | Payer: Medicaid Other | Source: Ambulatory Visit | Attending: Pulmonary Disease | Admitting: Pulmonary Disease

## 2017-08-16 DIAGNOSIS — I700 Atherosclerosis of aorta: Secondary | ICD-10-CM | POA: Diagnosis not present

## 2017-08-16 DIAGNOSIS — Z9889 Other specified postprocedural states: Secondary | ICD-10-CM | POA: Diagnosis not present

## 2017-08-16 DIAGNOSIS — I517 Cardiomegaly: Secondary | ICD-10-CM | POA: Insufficient documentation

## 2017-08-16 DIAGNOSIS — R942 Abnormal results of pulmonary function studies: Secondary | ICD-10-CM | POA: Diagnosis not present

## 2017-08-16 DIAGNOSIS — R590 Localized enlarged lymph nodes: Secondary | ICD-10-CM | POA: Diagnosis not present

## 2017-08-17 ENCOUNTER — Ambulatory Visit (INDEPENDENT_AMBULATORY_CARE_PROVIDER_SITE_OTHER): Payer: Medicaid Other | Admitting: Family Medicine

## 2017-08-17 ENCOUNTER — Encounter: Payer: Self-pay | Admitting: Family Medicine

## 2017-08-17 VITALS — BP 138/70 | HR 68 | Temp 97.9°F | Resp 18 | Ht 67.0 in | Wt 236.0 lb

## 2017-08-17 DIAGNOSIS — F419 Anxiety disorder, unspecified: Secondary | ICD-10-CM | POA: Insufficient documentation

## 2017-08-17 DIAGNOSIS — G8929 Other chronic pain: Secondary | ICD-10-CM

## 2017-08-17 DIAGNOSIS — I10 Essential (primary) hypertension: Secondary | ICD-10-CM

## 2017-08-17 DIAGNOSIS — Z23 Encounter for immunization: Secondary | ICD-10-CM

## 2017-08-17 DIAGNOSIS — D649 Anemia, unspecified: Secondary | ICD-10-CM | POA: Diagnosis not present

## 2017-08-17 DIAGNOSIS — M545 Low back pain: Secondary | ICD-10-CM

## 2017-08-17 MED ORDER — GABAPENTIN 300 MG PO CAPS: ORAL_CAPSULE | ORAL | 1 refills | Status: DC

## 2017-08-17 MED ORDER — FLUOXETINE HCL 40 MG PO CAPS: 40.0000 mg | ORAL_CAPSULE | Freq: Every day | ORAL | 1 refills | Status: DC

## 2017-08-17 MED ORDER — CYCLOBENZAPRINE HCL 5 MG PO TABS: 5.0000 mg | ORAL_TABLET | Freq: Every day | ORAL | Status: DC | PRN

## 2017-08-17 NOTE — Progress Notes (Signed)
Patient ID: Toni [name], female    DOB: 01-Dec-1955, 61 y.o.   MRN: [id number]  Chief Complaint  Patient presents with  . Follow-up    2 week  sob    Allergies Iodinated diagnostic agents  Subjective:   Toni [name] is a 61 y.o. female who presents to [facility] today.  HPI Here to follow up. Patient reports that she has been doing well. Her eye is normal female. Reports that her foot pain has gotten better.   Would like to talk about her mood and anxiety. Has been treated for anxiety for about 2 years. Used the prozac qd. Does not have side effects with it. Does not feel like the medication is helping her. She reports that she worries a lot about her health and stressors in her life. Does not feel depressed but does feel anxious. Denies any suicidal ideations. Does not do things that she used to do for fun. Energy level can be low at times but pretty good. Appetite is good. Does not feel guilty. Reports she feels stressed and get things on her mind and is hard to get them off.  BP has been running well.  Denies any chest pain. Reports that she does still get short of breath. Reports she was seen by Dr. Luan Pulling yesterday and had a chest x-ray performed. Reports that they believe that her shortness of breath could be related to her heart but they think that her lungs are fine. Has not had hardly any edema in her lower extremities. Denies palpitations. Has been taking her medications as directed. Review of chart, patient does have a history of anemia. She reports that she was placed on iron pills and then she was told to stop the pills. She is currently not taking iron pills. She denies any vaginal bleeding or blood in her urine. She does not recall that she was anemic in the past. Reports she has had lab test in the past to figure out why she is anemic.  Has been on medication for GERD for several years. Reports had an endoscopy and a colonoscopy less than a year ago and they  were both normal. Uses the medication for reflux and needs a refill.    Past Medical History:  Diagnosis Date  . Congestive heart failure, unspecified    Diastolic heart failure  . Degeneration of lumbar or lumbosacral intervertebral disc   . Depression   . Diverticulosis   . Dyspnea     chronic multifactorial large component of deconditioning  . Esophageal reflux   . Insomnia    Working third shift  . Mitral stenosis    Secondary to under-sized ring annuloplasty following mitral valve repair  . Mitral valve disorders(424.0)    Mild to moderate mitral regurgitation  . Mitral valve insufficiency and aortic valve insufficiency   . Morbid obesity ([place])   . Normocytic anemia   . Other chest pain   . Other dyspnea and respiratory abnormality   . Paroxysmal atrial fibrillation ([name])    Stable no recurrence  . Peripheral edema    chronic  . S/P Maze operation for atrial fibrillation 02/27/2015  . S/P mitral valve repair 02/27/2015   "radical repair" including resection of P2 segment of posterior leaflet with 26 mm Edwards Physio II annuloplasty ring  . S/P tricuspid valve repair 02/27/2015   26 mm annuloplasty ring  . Unspecified essential hypertension     Past Surgical History:  Procedure Laterality  Date  . APPENDECTOMY    . BREAST BIOPSY Left 2009  . CARDIAC VALVE SURGERY  02/27/2015   Mikel Cella. Mitral Valve Repair. Radical Repair with resection of P2 Prolapse, 26 mm Physio II Ring Annuloplasty, Tricuspid Valve Repair (26 mm Ring Annuloplasty, Cox Maze IV Procedure on 02/27/2015  . CHOLECYSTECTOMY    . COLONOSCOPY  2011   Dr. Laural Golden: 12mm polyp from hepatic flexure (tubular adenoma)  . COLONOSCOPY N/A 11/10/2016   Dr. Gala Romney: Diverticulosis. Next colonoscopy 7 years, January 2025  . ESOPHAGOGASTRODUODENOSCOPY  2005    Dr. Gala Romney : normal esophagus s/p dilation. Small hiatal hernia noted. Felt improved after dilation.   . ESOPHAGOGASTRODUODENOSCOPY N/A 11/10/2016   Dr. Gala Romney:  Moderate-sized hiatal hernia, esophagus normal status post empiric dilation for history of dysphagia  . HEEL SPUR SURGERY Right   . MALONEY DILATION N/A 11/10/2016   Procedure: Venia Minks DILATION;  Surgeon: Daneil Dolin, MD;  [facility];  Service: Endoscopy;  Laterality: N/A;  . MINIMALLY INVASIVE TRICUSPID VALVE REPAIR  02/27/2015   [place]  . MITRAL VALVE REPAIR  02/27/2015   [place]  . TEE WITHOUT CARDIOVERSION N/A 10/18/2016   Procedure: TRANSESOPHAGEAL ECHOCARDIOGRAM (TEE);  Surgeon: Arnoldo Lenis, MD;  [facility];  Service: Endoscopy;  Laterality: N/A;  . TOTAL ABDOMINAL HYSTERECTOMY    . TOTAL KNEE ARTHROPLASTY Right   . TUBAL LIGATION      Family History  Problem Relation Age of Onset  . Hypertension Mother   . Heart disease Mother   . Kidney disease Father   . Stroke Father   . Diabetes Sister   . Kidney disease Sister   . Heart disease Sister   . Hypertension Sister   . Heart disease Brother   . Heart attack Son   . Hypertension Son   . Cancer Paternal Grandmother        Breast Cancer  . Heart disease Brother   . Colon cancer Neg Hx      Social History   Social History  . Marital status: Single    Spouse name: N/A  . Number of children: N/A  . Years of education: N/A   Social History Main Topics  . Smoking status: Former Smoker    Packs/day: 0.50    Years: 25.00    Types: Cigarettes    Quit date: 1997  . Smokeless tobacco: Former Systems developer    Types: [place] date: 1975     Comment: dipped tobacco during pregancy in 1975  . Alcohol use No  . Drug use: No  . Sexual activity: Yes    Partners: Male   Other Topics Concern  . None   Social History Narrative  . None    Review of Systems  Constitutional: Negative for activity change, appetite change, chills, fever and unexpected weight change.  HENT: Negative for nosebleeds, trouble swallowing and voice change.     Eyes: Negative for visual disturbance.  Respiratory: Positive for shortness of breath. Negative for cough, choking, chest tightness and wheezing.        Does get short of breath with exertion and tends just to walk slower.  Cardiovascular: Negative for chest pain and palpitations.  Gastrointestinal: Negative for abdominal pain, diarrhea, nausea and rectal pain.  Endocrine: Negative for polydipsia, polyphagia and polyuria.  Genitourinary: Negative for dysuria, frequency, urgency and vaginal bleeding.  Musculoskeletal: Positive for back pain and myalgias. Negative for joint swelling,  neck pain and neck stiffness.  Skin: Negative for rash.  Neurological: Negative for dizziness, tremors, seizures, syncope, speech difficulty, weakness and numbness.  Hematological: Negative for adenopathy.  Psychiatric/Behavioral: Positive for agitation, decreased concentration and sleep disturbance. Negative for dysphoric mood, hallucinations, self-injury and suicidal ideas. The patient is nervous/anxious.      Objective:   BP 138/70 (BP Location: Right Arm, Patient Position: Sitting, Cuff Size: Large)   Pulse 68   Temp 97.9 F (36.6 C) (Temporal)   Resp 18   Ht 5\' 7"  (1.702 m)   Wt 236 lb 0.6 oz (107.1 kg)   LMP  (LMP Unknown)   SpO2 94%   BMI 36.97 kg/m   Physical Exam  Constitutional: She is oriented to person, place, and time. She appears well-developed and well-nourished.  HENT:  Head: Normocephalic and atraumatic.  Mouth/Throat: Oropharyngeal exudate present.  Eyes: Pupils are equal, round, and reactive to light. Conjunctivae and EOM are normal.  Neck: Normal range of motion. Neck supple. No JVD present. No tracheal deviation present. No thyromegaly present.  Cardiovascular: Normal rate and regular rhythm.   Pulmonary/Chest: Effort normal and breath sounds normal.  Abdominal: Soft. Bowel sounds are normal. She exhibits no distension. There is no tenderness.  Musculoskeletal: She exhibits  tenderness. She exhibits no edema.  Right sided muscular lower back tenderness to palpation. Strength in lower extremities 5 out of 5 throughout. Sensation intact in upper and lower extremities.  Lymphadenopathy:    She has no cervical adenopathy.  Neurological: She is alert and oriented to person, place, and time. She displays normal reflexes. No cranial nerve deficit. Coordination normal.  Skin: Skin is warm and dry. No rash noted. No erythema. No pallor.  No petechiae.  Psychiatric: Her behavior is normal. Judgment and thought content normal.  Mood anxious. Affect congruent with mood. Denies suicidal or homicidal ideations. Behavior normal. Well-dressed well-groomed pleasant female in no acute distress. Denies auditory or visual hallucinations.  Vitals reviewed.    Assessment and Plan   1. Need for influenza vaccination Immunization record requested. Discussed with patient that we need to check and see if she has had pneumonia vaccinations. She defers at this time. - Flu Vaccine QUAD 36+ mos IM  2. Anxiety Increase Prozac to 40 mg 1 by mouth daily. Patient counseled concerning risks and benefits of medication.Suicide risks evaluated and documented in note if present or in the area below.  Patient does not have/denies the following risks: previous suicide attempts, family history of suicide, access to lethal means, prior history of psychiatric disorder, history of alcohol or substance abuse disorder, recent loss of a loved one, or severe hopelessness. Patient has protective factors of family and community support.  Patient displays problem solving skills.   Patient specifically denies suicide ideation. Patient has access/information to healthcare contacts if situation or mood changes where patient is a risk to self or others or mood becomes unstable.   During the encounter, the patient had good eye contact and firm handshake regarding safety contract and agreement to seek help if mood  worsens and not to harm self.   Patient understands the treatment plan and is in agreement. Agrees to keep follow up and call prior or return to clinic if needed.   - FLUoxetine (PROZAC) 40 MG capsule; Take 1 capsule (40 mg total) by mouth daily.  Dispense: 30 capsule; Refill: 1  3. Anemia of unknown etiology Uncertain etiology of anemia. Wonder if this could be associated with her shortness of  breath. Check labs for iron deficiency anemia. Check hemoglobin electrophoresis at this time. Possibly due to low renal erythropoietin levels. If all testing is within normal limits and has had negative gastro-enterology workup, consider referral to hematology. - CBC with Differential/Platelet - Fe+TIBC+Fer - Hemoglobinopathy evaluation - Erythropoietin - B12 - Folate  4. Essential hypertension Stable. Continue medication. Low-salt diet recommended. - Basic metabolic panel  5. Chronic right-sided low back pain without sciatica Continue medications as directed. Patient will use heating pad 10-15 minutes several times a day as needed. She will also try the muscle relaxer but understands that this can make her sleepy or tired. We discussed careful use of this medication because it could increase her risk of falls. She voiced understanding and would like to try this medication. - gabapentin (NEURONTIN) 300 MG capsule; Take one tablet in the morning and two tablets in the evening  Dispense: 270 capsule; Refill: 1 - cyclobenzaprine (FLEXERIL) 5 MG tablet; Take 1 tablet (5 mg total) by mouth daily as needed for muscle spasms.  Dispense: 30 tablet; Refill: 00   patient will call with any questions concerns or worrisome signs or symptoms. She was told that if she had any abrupt changes in her mood or concerns to please call or go to the emergency department. She voiced understanding.  Return in about 4 weeks (around 09/14/2017) for anemia/mood. Caren Macadam, MD 08/17/2017

## 2017-08-19 LAB — CBC WITH DIFFERENTIAL/PLATELET
Basophils Absolute: 32 cells/uL (ref 0–200)
Basophils Relative: 0.6 %
Eosinophils Absolute: 70 cells/uL (ref 15–500)
Eosinophils Relative: 1.3 %
HCT: 34.3 % — ABNORMAL LOW (ref 35.0–45.0)
Hemoglobin: 11.3 g/dL — ABNORMAL LOW (ref 11.7–15.5)
Lymphs Abs: 1231 cells/uL (ref 850–3900)
MCH: 30.3 pg (ref 27.0–33.0)
MCHC: 32.9 g/dL (ref 32.0–36.0)
MCV: 92 fL (ref 80.0–100.0)
MPV: 10.6 fL (ref 7.5–12.5)
Monocytes Relative: 7.5 %
Neutro Abs: 3661 cells/uL (ref 1500–7800)
Neutrophils Relative %: 67.8 %
Platelets: 168 10*3/uL (ref 140–400)
RBC: 3.73 10*6/uL — ABNORMAL LOW (ref 3.80–5.10)
RDW: 13.5 % (ref 11.0–15.0)
Total Lymphocyte: 22.8 %
WBC mixed population: 405 cells/uL (ref 200–950)
WBC: 5.4 10*3/uL (ref 3.8–10.8)

## 2017-08-19 LAB — IRON,TIBC AND FERRITIN PANEL
%SAT: 17 % (calc) (ref 11–50)
Ferritin: 35 ng/mL (ref 20–288)
Iron: 66 ug/dL (ref 45–160)
TIBC: 389 mcg/dL (calc) (ref 250–450)

## 2017-08-19 LAB — BASIC METABOLIC PANEL
BUN/Creatinine Ratio: 26 (calc) — ABNORMAL HIGH (ref 6–22)
BUN: 29 mg/dL — ABNORMAL HIGH (ref 7–25)
CO2: 28 mmol/L (ref 20–32)
Calcium: 9.6 mg/dL (ref 8.6–10.4)
Chloride: 104 mmol/L (ref 98–110)
Creat: 1.1 mg/dL — ABNORMAL HIGH (ref 0.50–0.99)
Glucose, Bld: 88 mg/dL (ref 65–99)
Potassium: 4 mmol/L (ref 3.5–5.3)
Sodium: 139 mmol/L (ref 135–146)

## 2017-08-19 LAB — VITAMIN B12: Vitamin B-12: 597 pg/mL (ref 200–1100)

## 2017-08-19 LAB — FOLATE: Folate: 13.5 ng/mL

## 2017-08-19 LAB — ERYTHROPOIETIN: Erythropoietin: 55.2 m[IU]/mL — ABNORMAL HIGH (ref 2.6–18.5)

## 2017-08-26 ENCOUNTER — Telehealth: Payer: Self-pay | Admitting: Family Medicine

## 2017-08-26 MED ORDER — METOPROLOL TARTRATE 100 MG PO TABS: 100.0000 mg | ORAL_TABLET | Freq: Two times a day (BID) | ORAL | 3 refills | Status: DC

## 2017-08-26 NOTE — Telephone Encounter (Signed)
PATIENT IS REQUESTING A REFILL OF METOPROLOL  [facility]

## 2017-08-30 ENCOUNTER — Ambulatory Visit: Payer: Self-pay | Admitting: Physician Assistant

## 2017-09-02 ENCOUNTER — Encounter: Payer: Self-pay | Admitting: Family Medicine

## 2017-09-02 ENCOUNTER — Ambulatory Visit (INDEPENDENT_AMBULATORY_CARE_PROVIDER_SITE_OTHER): Payer: Medicaid Other | Admitting: Family Medicine

## 2017-09-02 VITALS — BP 120/84 | HR 82 | Temp 97.7°F | Resp 16 | Ht 67.0 in | Wt 234.5 lb

## 2017-09-02 DIAGNOSIS — F419 Anxiety disorder, unspecified: Secondary | ICD-10-CM

## 2017-09-02 DIAGNOSIS — N649 Disorder of breast, unspecified: Secondary | ICD-10-CM

## 2017-09-02 DIAGNOSIS — I10 Essential (primary) hypertension: Secondary | ICD-10-CM

## 2017-09-02 DIAGNOSIS — K219 Gastro-esophageal reflux disease without esophagitis: Secondary | ICD-10-CM

## 2017-09-02 MED ORDER — OMEPRAZOLE 40 MG PO CPDR: 40.0000 mg | DELAYED_RELEASE_CAPSULE | Freq: Every day | ORAL | 0 refills | Status: DC

## 2017-09-02 MED ORDER — METOPROLOL TARTRATE 100 MG PO TABS: 100.0000 mg | ORAL_TABLET | Freq: Two times a day (BID) | ORAL | 3 refills | Status: DC

## 2017-09-02 NOTE — Progress Notes (Signed)
Patient ID: Toni [name], female    DOB: 1955-11-30, 61 y.o.   MRN: [id number]  Chief Complaint  Patient presents with  . Follow-up    Allergies Iodinated diagnostic agents  Subjective:   Toni [name] is a 61 y.o. female who presents to [facility] today.  HPI Patient presents today for follow-up for her mood. She also reports she was seen yesterday by Dr. Luan Pulling and he told her to follow-up to discuss her testing. I have not received his office note or testing, but upon looking in the computer patient did have a recent CT scan which revealed enlarged lymph nodes in her left axillary region. She reports that she has had some pain in her left breast for the past several weeks. Reports that her breast is tender. She reports that she had a mammogram a year ago which she believes was normal. She denies any skin changes in her breast or any nipple discharge. She denies any personal or family history of breast cancer. She reports that she has not felt any lumps or lesions in her breasts.  Patient reports that she did increase the dose of her Prozac as we discussed. She believes it is helping. She has felt less anxious and stressed. She does not seem to worry about her health issues so much and focus on them. She denies any side effects with the increased dose. Reports that she is sleeping better at night and therefore has more energy during the day. Feels like her mood has improved she does not feel sad. Denies any suicidal ideations. Reports she is eating well and has good appetite.  Reports her blood pressure has been running well. Denies any chest pain or shortness of breath. No swelling in her legs. Reports compliance with her medication.    Past Medical History:  Diagnosis Date  . Congestive heart failure, unspecified    Diastolic heart failure  . Degeneration of lumbar or lumbosacral intervertebral disc   . Depression   . Diverticulosis   . Dyspnea     chronic  multifactorial large component of deconditioning  . Esophageal reflux   . Insomnia    Working third shift  . Mitral stenosis    Secondary to under-sized ring annuloplasty following mitral valve repair  . Mitral valve disorders(424.0)    Mild to moderate mitral regurgitation  . Mitral valve insufficiency and aortic valve insufficiency   . Morbid obesity ([place])   . Normocytic anemia   . Other chest pain   . Other dyspnea and respiratory abnormality   . Paroxysmal atrial fibrillation ([name])    Stable no recurrence  . Peripheral edema    chronic  . S/P Maze operation for atrial fibrillation 02/27/2015  . S/P mitral valve repair 02/27/2015   "radical repair" including resection of P2 segment of posterior leaflet with 26 mm Edwards Physio II annuloplasty ring  . S/P tricuspid valve repair 02/27/2015   26 mm annuloplasty ring  . Unspecified essential hypertension     Past Surgical History:  Procedure Laterality Date  . APPENDECTOMY    . BREAST BIOPSY Left 2009  . CARDIAC VALVE SURGERY  02/27/2015   Mikel Cella. Mitral Valve Repair. Radical Repair with resection of P2 Prolapse, 26 mm Physio II Ring Annuloplasty, Tricuspid Valve Repair (26 mm Ring Annuloplasty, Cox Maze IV Procedure on 02/27/2015  . CHOLECYSTECTOMY    . COLONOSCOPY  2011   Dr. Laural Golden: 41mm polyp from hepatic flexure (tubular adenoma)  . COLONOSCOPY  N/A 11/10/2016   Dr. Gala Romney: Diverticulosis. Next colonoscopy 7 years, January 2025  . ESOPHAGOGASTRODUODENOSCOPY  2005    Dr. Gala Romney : normal esophagus s/p dilation. Small hiatal hernia noted. Felt improved after dilation.   . ESOPHAGOGASTRODUODENOSCOPY N/A 11/10/2016   Dr. Gala Romney: Moderate-sized hiatal hernia, esophagus normal status post empiric dilation for history of dysphagia  . HEEL SPUR SURGERY Right   . MALONEY DILATION N/A 11/10/2016   Procedure: Venia Minks DILATION;  Surgeon: Daneil Dolin, MD;  Location: [facility];  Service: Endoscopy;  Laterality: N/A;  . MINIMALLY INVASIVE  TRICUSPID VALVE REPAIR  02/27/2015   [place]  . MITRAL VALVE REPAIR  02/27/2015   [place]  . TEE WITHOUT CARDIOVERSION N/A 10/18/2016   Procedure: TRANSESOPHAGEAL ECHOCARDIOGRAM (TEE);  Surgeon: Arnoldo Lenis, MD;  Location: [facility];  Service: Endoscopy;  Laterality: N/A;  . TOTAL ABDOMINAL HYSTERECTOMY    . TOTAL KNEE ARTHROPLASTY Right   . TUBAL LIGATION      Family History  Problem Relation Age of Onset  . Hypertension Mother   . Heart disease Mother   . Kidney disease Father   . Stroke Father   . Diabetes Sister   . Kidney disease Sister   . Heart disease Sister   . Hypertension Sister   . Heart disease Brother   . Heart attack Son   . Hypertension Son   . Cancer Paternal Grandmother        Breast Cancer  . Heart disease Brother   . Colon cancer Neg Hx      Social History   Social History  . Marital status: Single    Spouse name: N/A  . Number of children: N/A  . Years of education: N/A   Social History Main Topics  . Smoking status: Former Smoker    Packs/day: 0.50    Years: 25.00    Types: Cigarettes    Quit date: 1997  . Smokeless tobacco: Former Systems developer    Types: [place] date: 1975     Comment: dipped tobacco during pregancy in 1975  . Alcohol use No  . Drug use: No  . Sexual activity: Yes    Partners: Male   Other Topics Concern  . None   Social History Narrative   Lives alone or son will stay with her at times. Cooks. Eats well.    Current Outpatient Prescriptions on File Prior to Visit  Medication Sig Dispense Refill  . albuterol (PROVENTIL HFA;VENTOLIN HFA) 108 (90 Base) MCG/ACT inhaler Inhale 2 puffs into the lungs every 6 (six) hours as needed for wheezing or shortness of breath. 1 Inhaler 3  . amLODipine (NORVASC) 5 MG tablet Take 1 tablet (5 mg total) by mouth daily. 90 tablet 3  . aspirin 325 MG EC tablet Take 325 mg by mouth daily.    . cyclobenzaprine (FLEXERIL) 5 MG  tablet Take 1 tablet (5 mg total) by mouth daily as needed for muscle spasms. 30 tablet 00  . FLUoxetine (PROZAC) 40 MG capsule Take 1 capsule (40 mg total) by mouth daily. 30 capsule 1  . gabapentin (NEURONTIN) 300 MG capsule Take one tablet in the morning and two tablets in the evening 270 capsule 1  . indomethacin (INDOCIN) 25 MG capsule 1 po q 8 hour prn gout pain 20 capsule 1  . linaclotide (LINZESS) 72 MCG capsule Take 1 capsule (72 mcg total) by mouth daily before breakfast. 90 capsule  3  . Liniments (SALONPAS PAIN RELIEF PATCH EX) Apply 1 patch topically daily as needed (pain).    Marland Kitchen lisinopril (PRINIVIL,ZESTRIL) 20 MG tablet Take 1 tablet (20 mg total) by mouth daily. 90 tablet 3  . magnesium gluconate (MAGONATE) 500 MG tablet Take 500 mg by mouth daily.    Marland Kitchen torsemide (DEMADEX) 20 MG tablet Take 1 tablet (20 mg total) by mouth daily. 90 tablet 3   No current facility-administered medications on file prior to visit.      Review of Systems  Constitutional: Negative for activity change, chills, diaphoresis, fatigue, fever and unexpected weight change.  Respiratory: Negative for cough, choking, chest tightness and shortness of breath.   Cardiovascular: Negative for chest pain, palpitations and leg swelling.  Gastrointestinal: Negative for abdominal pain and nausea.  Endocrine: Negative for polyphagia and polyuria.  Genitourinary: Negative for dysuria.  Musculoskeletal: Negative for back pain.  Neurological: Negative for syncope.  Hematological: Negative for adenopathy. Does not bruise/bleed easily.  Psychiatric/Behavioral: Negative for behavioral problems, decreased concentration, dysphoric mood and hallucinations. The patient is not nervous/anxious and is not hyperactive.      Objective:   BP 120/84 (BP Location: Left Arm, Patient Position: Sitting, Cuff Size: Normal)   Pulse 82   Temp 97.7 F (36.5 C) (Other (Comment))   Resp 16   Ht 5\' 7"  (1.702 m)   Wt 234 lb 8 oz (106.4  kg)   LMP  (LMP Unknown)   SpO2 95%   BMI 36.73 kg/m   Physical Exam  Constitutional: She appears well-developed and well-nourished.  HENT:  Head: Normocephalic and atraumatic.  Eyes: Pupils are equal, round, and reactive to light. EOM are normal.  Neck: Normal range of motion. Neck supple.  Cardiovascular: Normal rate and regular rhythm.   Pulmonary/Chest: Effort normal and breath sounds normal.  Right breast questionable density, 4 cm at 11 o'clock position, slightly tender to palpation, scars on chest walls consistent to previous surgeries. Left breast palpable density 2-3 o'clock position, 11-12 o'clock position tender to palpation, questionable palpable axillary nodes vs body habitus,   Abdominal: Soft. Bowel sounds are normal.  Skin: Skin is warm and dry.  Psychiatric: She has a normal mood and affect. Her speech is normal and behavior is normal. Judgment and thought content normal. She is not actively hallucinating. Cognition and memory are normal. She expresses no homicidal and no suicidal ideation. She expresses no suicidal plans and no homicidal plans.  Pleasant female well-dressed well-groomed. Smiles throughout visit. Seems less anxious than last visit. No distracting behaviors. She is attentive.  Vitals reviewed.    Assessment and Plan   1. Lesion of breast Questionable lesions on exam versus dense breasts. Needs evaluation. CT scan discuss with patient today. - MM Digital Diagnostic Bilat; Future - MM DIAG BREAST TOMO BILATERAL; Future - US BREAST LTD UNI RIGHT INC AXILLA; Future - US BREAST LTD UNI LEFT INC AXILLA; Future  2. Essential hypertension Well controlled. Refill medications. Keep scheduled visits with cardiology. - metoprolol tartrate (LOPRESSOR) 100 MG tablet; Take 1 tablet (100 mg total) by mouth 2 (two) times daily.  Dispense: 180 tablet; Refill: 3  3. Anxiety Improved on increased dose of Prozac. Relaxation and stress reduction discussed. Patient told  to call or return to clinic with any mood problems or changes in her mood. Suicide precautions discussed and given today. Suicide risks evaluated and documented in note if present or in the area below.  Patient specifically denies suicide ideation. Patient has  access/information to healthcare contacts if situation or mood changes where patient is a risk to self or others or mood becomes unstable.   During the encounter, the patient had good eye contact and firm handshake regarding safety contract and agreement to seek help if mood worsens and not to harm self.   Patient understands the treatment plan and is in agreement. Agrees to keep follow up and call prior or return to clinic if needed.    4. Gastroesophageal reflux disease without esophagitis Refill requested per patient she will keep her scheduled visits with Dr. Gala Romney. - omeprazole (PRILOSEC) 40 MG capsule; Take 1 capsule (40 mg total) by mouth daily.  Dispense: 30 capsule; Refill: 0  Return in about 2 months (around 11/02/2017) for follow up fasting. Caren Macadam, MD 09/02/2017

## 2017-09-07 LAB — CBC WITH DIFFERENTIAL/PLATELET
Basophils Absolute: 18 cells/uL (ref 0–200)
Basophils Relative: 0.4 %
Eosinophils Absolute: 101 cells/uL (ref 15–500)
Eosinophils Relative: 2.2 %
HCT: 33.5 % — ABNORMAL LOW (ref 35.0–45.0)
Hemoglobin: 11 g/dL — ABNORMAL LOW (ref 11.7–15.5)
Lymphs Abs: 1168 cells/uL (ref 850–3900)
MCH: 30.1 pg (ref 27.0–33.0)
MCHC: 32.8 g/dL (ref 32.0–36.0)
MCV: 91.5 fL (ref 80.0–100.0)
MPV: 10.7 fL (ref 7.5–12.5)
Monocytes Relative: 9.3 %
Neutro Abs: 2884 cells/uL (ref 1500–7800)
Neutrophils Relative %: 62.7 %
Platelets: 153 10*3/uL (ref 140–400)
RBC: 3.66 10*6/uL — ABNORMAL LOW (ref 3.80–5.10)
RDW: 12.9 % (ref 11.0–15.0)
Total Lymphocyte: 25.4 %
WBC mixed population: 428 cells/uL (ref 200–950)
WBC: 4.6 10*3/uL (ref 3.8–10.8)

## 2017-09-08 ENCOUNTER — Telehealth: Payer: Self-pay | Admitting: Family Medicine

## 2017-09-08 NOTE — Telephone Encounter (Signed)
Toni [name] from [employer], faxed over a form for disability that needs to be completed so that insurance will take over a loan she had until she became disabled. In order for this to be done they need the forms completed Please call sandra [phone] to let you know we received this.

## 2017-09-08 NOTE — Telephone Encounter (Signed)
We have received form and patient was notified that she needs to be seen in office.

## 2017-09-11 NOTE — Progress Notes (Signed)
Normocytic anemia stable. Likely anemia of chronic disease. EGD/TCS up to date. Heme neg in 06/2017. Recheck CBC, iron/tibc/ferritin in 4 months.

## 2017-09-13 ENCOUNTER — Other Ambulatory Visit: Payer: Self-pay

## 2017-09-13 DIAGNOSIS — D649 Anemia, unspecified: Secondary | ICD-10-CM

## 2017-09-14 ENCOUNTER — Ambulatory Visit: Payer: Medicaid Other | Admitting: Family Medicine

## 2017-09-16 ENCOUNTER — Encounter: Payer: Self-pay | Admitting: Family Medicine

## 2017-09-16 ENCOUNTER — Ambulatory Visit (INDEPENDENT_AMBULATORY_CARE_PROVIDER_SITE_OTHER): Payer: Medicaid Other | Admitting: Family Medicine

## 2017-09-16 ENCOUNTER — Other Ambulatory Visit: Payer: Self-pay

## 2017-09-16 VITALS — BP 142/86 | HR 95 | Temp 98.2°F | Resp 16 | Ht 67.0 in | Wt 240.5 lb

## 2017-09-16 DIAGNOSIS — Z113 Encounter for screening for infections with a predominantly sexual mode of transmission: Secondary | ICD-10-CM | POA: Diagnosis not present

## 2017-09-16 DIAGNOSIS — I342 Nonrheumatic mitral (valve) stenosis: Secondary | ICD-10-CM

## 2017-09-16 DIAGNOSIS — Z23 Encounter for immunization: Secondary | ICD-10-CM | POA: Diagnosis not present

## 2017-09-16 DIAGNOSIS — M545 Low back pain, unspecified: Secondary | ICD-10-CM

## 2017-09-16 DIAGNOSIS — I5032 Chronic diastolic (congestive) heart failure: Secondary | ICD-10-CM | POA: Diagnosis not present

## 2017-09-16 DIAGNOSIS — M792 Neuralgia and neuritis, unspecified: Secondary | ICD-10-CM | POA: Diagnosis not present

## 2017-09-16 DIAGNOSIS — G8929 Other chronic pain: Secondary | ICD-10-CM | POA: Diagnosis not present

## 2017-09-16 DIAGNOSIS — I10 Essential (primary) hypertension: Secondary | ICD-10-CM | POA: Diagnosis not present

## 2017-09-16 MED ORDER — GABAPENTIN 300 MG PO CAPS: ORAL_CAPSULE | ORAL | 1 refills | Status: DC

## 2017-09-16 NOTE — Progress Notes (Signed)
Patient ID: Toni [name], female    DOB: 08/04/1956, 61 y.o.   MRN: [id number]  Chief Complaint  Patient presents with  . Other    form    Allergies Iodinated diagnostic agents  Subjective:   Toni [name] is a 61 y.o. female who presents to [facility] today.  HPI Toni [name] comes in today and brings in her paperwork for disability from the Social Security administration.  She brings in paperwork stating that she was found to be disabled under their roles on June 29, 2016.  They began to pay her Social Security benefits on May 17, 2017.  Toni [name] reports that she is on disability due to her heart conditions and other medical problems.  She brings in paperwork today that needs to be completed so that her insurance company will pay her alone.  This is previously been completed by her other primary care physician but she needs the forms updated so that she can continue with her loan payments.  She does bring in the appropriate paperwork today from the Social Security administrating office stating that she is disabled because of congestive heart failure, gout, depression, neuropathy, high blood pressure, sleep apnea, acid reflux, arthritis, irritable bowel syndrome, hernia, and back pain.  This letter states that based on all her medical and nonmedical evidence she was found on 06/29/2016 to be disabled and that is the date her disability began.  Patient reports that she did not take her fluid pill this morning because it makes her go to the bathroom a lot.  She reports she does usually take this.  She reports that when she takes her fluid pill her blood pressure is controlled.  She reports that she does not want to increase her lisinopril today.  She has not come in and gotten her blood work yet.  Reports that she has had some tingling in her left hand and arm related to arthritis.  She has previously been placed on gabapentin for this.  Reports that it used to help but does not  seem to be helping at this time.  Has not slept well the past couple nights because of the tingling pain.  Has seen a specialist in the past for this and does not want to go back at this time.    Past Medical History:  Diagnosis Date  . Congestive heart failure, unspecified    Diastolic heart failure  . Degeneration of lumbar or lumbosacral intervertebral disc   . Depression   . Diverticulosis   . Dyspnea     chronic multifactorial large component of deconditioning  . Esophageal reflux   . Insomnia    Working third shift  . Mitral stenosis    Secondary to under-sized ring annuloplasty following mitral valve repair  . Mitral valve disorders(424.0)    Mild to moderate mitral regurgitation  . Mitral valve insufficiency and aortic valve insufficiency   . Morbid obesity ([place])   . Normocytic anemia   . Other chest pain   . Other dyspnea and respiratory abnormality   . Paroxysmal atrial fibrillation ([name])    Stable no recurrence  . Peripheral edema    chronic  . S/P Maze operation for atrial fibrillation 02/27/2015  . S/P mitral valve repair 02/27/2015   "radical repair" including resection of P2 segment of posterior leaflet with 26 mm Edwards Physio II annuloplasty ring  . S/P tricuspid valve repair 02/27/2015   26 mm annuloplasty ring  . Unspecified  essential hypertension     Past Surgical History:  Procedure Laterality Date  . APPENDECTOMY    . BREAST BIOPSY Left 2009  . CARDIAC VALVE SURGERY  02/27/2015   Mikel Cella. Mitral Valve Repair. Radical Repair with resection of P2 Prolapse, 26 mm Physio II Ring Annuloplasty, Tricuspid Valve Repair (26 mm Ring Annuloplasty, Cox Maze IV Procedure on 02/27/2015  . CHOLECYSTECTOMY    . COLONOSCOPY  2011   Dr. Laural Golden: 76mm polyp from hepatic flexure (tubular adenoma)  . ESOPHAGOGASTRODUODENOSCOPY  2005    Dr. Gala Romney : normal esophagus s/p dilation. Small hiatal hernia noted. Felt improved after dilation.   Marland Kitchen HEEL SPUR SURGERY Right   .  MINIMALLY INVASIVE TRICUSPID VALVE REPAIR  02/27/2015   [place]  . MITRAL VALVE REPAIR  02/27/2015   [place]  . TOTAL ABDOMINAL HYSTERECTOMY    . TOTAL KNEE ARTHROPLASTY Right   . TUBAL LIGATION      Family History  Problem Relation Age of Onset  . Hypertension Mother   . Heart disease Mother   . Kidney disease Father   . Stroke Father   . Diabetes Sister   . Kidney disease Sister   . Heart disease Sister   . Hypertension Sister   . Heart disease Brother   . Heart attack Son   . Hypertension Son   . Cancer Paternal Grandmother        Breast Cancer  . Heart disease Brother   . Colon cancer Neg Hx      Social History   Socioeconomic History  . Marital status: Single    Spouse name: None  . Number of children: None  . Years of education: None  . Highest education level: None  Social Needs  . Financial resource strain: None  . Food insecurity - worry: None  . Food insecurity - inability: None  . Transportation needs - medical: None  . Transportation needs - non-medical: None  Occupational History  . None  Tobacco Use  . Smoking status: Former Smoker    Packs/day: 0.50    Years: 25.00    Pack years: 12.50    Types: Cigarettes    Last attempt to quit: 1997    Years since quitting: 21.8  . Smokeless tobacco: Former Systems developer    Types: Chew    Quit date: 21  . Tobacco comment: dipped tobacco during pregancy in 1975  Substance and Sexual Activity  . Alcohol use: No  . Drug use: No  . Sexual activity: Yes    Partners: Male  Other Topics Concern  . None  Social History Narrative   Lives alone or son will stay with her at times. Cooks. Eats well.    Current Outpatient Medications on File Prior to Visit  Medication Sig Dispense Refill  . albuterol (PROVENTIL HFA;VENTOLIN HFA) 108 (90 Base) MCG/ACT inhaler Inhale 2 puffs into the lungs every 6 (six) hours as needed for wheezing or shortness of breath. 1 Inhaler  3  . amLODipine (NORVASC) 5 MG tablet Take 1 tablet (5 mg total) by mouth daily. 90 tablet 3  . aspirin 325 MG EC tablet Take 325 mg by mouth daily.    . cyclobenzaprine (FLEXERIL) 5 MG tablet Take 1 tablet (5 mg total) by mouth daily as needed for muscle spasms. 30 tablet 00  . FLUoxetine (PROZAC) 40 MG capsule Take 1 capsule (40 mg total) by mouth daily. 30 capsule 1  . indomethacin (INDOCIN) 25 MG  capsule 1 po q 8 hour prn gout pain 20 capsule 1  . linaclotide (LINZESS) 72 MCG capsule Take 1 capsule (72 mcg total) by mouth daily before breakfast. 90 capsule 3  . Liniments (SALONPAS PAIN RELIEF PATCH EX) Apply 1 patch topically daily as needed (pain).    . magnesium gluconate (MAGONATE) 500 MG tablet Take 500 mg by mouth daily.    . metoprolol tartrate (LOPRESSOR) 100 MG tablet Take 1 tablet (100 mg total) by mouth 2 (two) times daily. 180 tablet 3  . omeprazole (PRILOSEC) 40 MG capsule Take 1 capsule (40 mg total) by mouth daily. 30 capsule 0  . torsemide (DEMADEX) 20 MG tablet Take 1 tablet (20 mg total) by mouth daily. 90 tablet 3  . lisinopril (PRINIVIL,ZESTRIL) 20 MG tablet Take 1 tablet (20 mg total) by mouth daily. 90 tablet 3   No current facility-administered medications on file prior to visit.     Review of Systems  Constitutional: Negative for activity change, appetite change and fever.  Eyes: Negative for visual disturbance.  Respiratory: Negative for cough, chest tightness and shortness of breath.   Cardiovascular: Negative for chest pain, palpitations and leg swelling.  Gastrointestinal: Negative for abdominal pain, nausea and vomiting.  Endocrine: Negative for polyphagia and polyuria.  Genitourinary: Negative for dysuria, frequency and urgency.  Musculoskeletal: Negative for myalgias and neck pain.  Neurological: Negative for dizziness, seizures, syncope, weakness, light-headedness and headaches.  Hematological: Negative for adenopathy. Does not bruise/bleed easily.    Psychiatric/Behavioral: Negative for agitation and behavioral problems. The patient is not nervous/anxious.      Objective:   BP (!) 142/86 (BP Location: Left Arm, Patient Position: Sitting, Cuff Size: Normal)   Pulse 95   Temp 98.2 F (36.8 C) (Other (Comment))   Resp 16   Ht 5\' 7"  (1.702 m)   Wt 240 lb 8 oz (109.1 kg)   LMP  (LMP Unknown)   SpO2 97%   BMI 37.67 kg/m   Physical Exam  Constitutional: She appears well-developed and well-nourished.  Neck: Normal range of motion. Neck supple.  Cardiovascular: Normal rate and regular rhythm.  Skin: Skin is warm and dry.  Psychiatric: She has a normal mood and affect. Her behavior is normal. Judgment and thought content normal.  Vitals reviewed.    Assessment and Plan  1. Chronic diastolic heart failure ([place]) Patient is not currently on statin therapy despite multiple cardiac diagnoses.  Check labs today. - Lipid panel - Hemoglobin A1c  2. Essential hypertension Discussed with patient that if she was not can be compliant with taking her diuretic that we needed to increase her other medication.  She reports that she will be compliant.  We discussed the cardiovascular risks of her blood pressure being uncontrolled especially with her heart failure and heart valve problems.  She reports she will take her medication and follow back in 1 month for recheck.  She will also try to cut down on her salt intake. - Lipid panel - Hemoglobin A1c  3. Non-rheumatic mitral valve stenosis Follow-up with cardiology as directed.  4. Screening examination for sexually transmitted disease Patient requested/performed today due to recommendations. - Hepatitis C Antibody - HIV antibody (with reflex)  5. Immunization due  - Pneumococcal conjugate vaccine 13-valent  6. Neuropathic pain At this time we will increase her Neurontin to 3 pills at bedtime as needed.  If she would like to follow back up with a hand specialist she will call our office  and  I will place referral.  Otherwise we will read the visit this topic again in 1 month.  7.  Completion of forms Patient's forms were completed today.  Copies of forms were given to patient and will be scanned into chart. OV greater than 25 min and over 50% spent counseling and coordinating care.   Return for BP check. Caren Macadam, MD 09/16/2017

## 2017-09-17 LAB — HEMOGLOBIN A1C
Hgb A1c MFr Bld: 5.4 % of total Hgb (ref <5.7)
Mean Plasma Glucose: 108 (calc)
eAG (mmol/L): 6 (calc)

## 2017-09-17 LAB — LIPID PANEL
Cholesterol: 119 mg/dL (ref <200)
HDL: 37 mg/dL — ABNORMAL LOW (ref >50)
LDL Cholesterol (Calc): 67 mg/dL (calc)
Non-HDL Cholesterol (Calc): 82 mg/dL (calc) (ref <130)
Total CHOL/HDL Ratio: 3.2 (calc) (ref <5.0)
Triglycerides: 70 mg/dL (ref <150)

## 2017-09-17 LAB — HIV ANTIBODY (ROUTINE TESTING W REFLEX): HIV 1&2 Ab, 4th Generation: NONREACTIVE

## 2017-09-17 LAB — HEPATITIS C ANTIBODY
Hepatitis C Ab: NONREACTIVE
SIGNAL TO CUT-OFF: 0.02 (ref <1.00)

## 2017-09-19 ENCOUNTER — Other Ambulatory Visit: Payer: Self-pay | Admitting: Family Medicine

## 2017-09-19 DIAGNOSIS — M545 Low back pain: Principal | ICD-10-CM

## 2017-09-19 DIAGNOSIS — G8929 Other chronic pain: Secondary | ICD-10-CM

## 2017-09-20 ENCOUNTER — Encounter (HOSPITAL_COMMUNITY): Payer: Self-pay

## 2017-09-20 ENCOUNTER — Other Ambulatory Visit: Payer: Self-pay | Admitting: Family Medicine

## 2017-09-20 ENCOUNTER — Ambulatory Visit (HOSPITAL_COMMUNITY)
Admission: RE | Admit: 2017-09-20 | Discharge: 2017-09-20 | Disposition: A | Discharge status: Home / Self Care | Payer: Medicaid Other | Source: Ambulatory Visit | Attending: Family Medicine | Admitting: Family Medicine

## 2017-09-20 ENCOUNTER — Telehealth: Payer: Self-pay | Admitting: *Deleted

## 2017-09-20 DIAGNOSIS — N649 Disorder of breast, unspecified: Secondary | ICD-10-CM

## 2017-09-20 DIAGNOSIS — R599 Enlarged lymph nodes, unspecified: Secondary | ICD-10-CM | POA: Insufficient documentation

## 2017-09-20 DIAGNOSIS — N6321 Unspecified lump in the left breast, upper outer quadrant: Secondary | ICD-10-CM | POA: Diagnosis not present

## 2017-09-20 NOTE — Telephone Encounter (Signed)
Darleen called from [employer] requesting the treatment date on patient. Please call Darleen at [phone] claim number [id number]

## 2017-09-20 NOTE — Telephone Encounter (Signed)
Spoke to [employer]. Provided information

## 2017-09-21 ENCOUNTER — Telehealth: Payer: Self-pay | Admitting: Family Medicine

## 2017-09-21 MED ORDER — CYCLOBENZAPRINE HCL 5 MG PO TABS: 5.0000 mg | ORAL_TABLET | Freq: Every day | ORAL | Status: DC | PRN

## 2017-09-21 NOTE — Telephone Encounter (Signed)
Patient needs precert on US performed yesterday

## 2017-09-22 ENCOUNTER — Encounter: Payer: Self-pay | Admitting: Family Medicine

## 2017-09-22 NOTE — Telephone Encounter (Signed)
Called patient regarding message below. No answer, left generic message for patient to return call.   

## 2017-09-22 NOTE — Telephone Encounter (Signed)
Please call patient and advise that her HIV and hepatitis testing was negative.  Advised her that her labs indicate that she is not diabetic at this time.  We can discuss her cholesterol results at her follow-up.  Please advise her that I received the mammogram results from Dr. Owens Shark and that she needs to proceed with the recommended testing regarding biopsy.  Please tell her that we will continue to follow and monitor the results and that we are thinking of her.  Please advise her to keep her follow-up.  Advised her I have sent a copy of her labs in the mail to her.

## 2017-09-26 NOTE — Telephone Encounter (Signed)
Called patient regarding message below. No answer, left generic message for patient to return call.   

## 2017-10-05 ENCOUNTER — Other Ambulatory Visit: Payer: Self-pay | Admitting: Family Medicine

## 2017-10-05 DIAGNOSIS — K219 Gastro-esophageal reflux disease without esophagitis: Secondary | ICD-10-CM

## 2017-10-07 ENCOUNTER — Other Ambulatory Visit: Payer: Self-pay

## 2017-10-11 ENCOUNTER — Other Ambulatory Visit: Payer: Self-pay | Admitting: Family Medicine

## 2017-10-11 ENCOUNTER — Ambulatory Visit (HOSPITAL_COMMUNITY)
Admission: RE | Admit: 2017-10-11 | Discharge: 2017-10-11 | Disposition: A | Discharge status: Home / Self Care | Payer: Medicaid Other | Source: Ambulatory Visit | Attending: Family Medicine | Admitting: Family Medicine

## 2017-10-11 ENCOUNTER — Encounter (HOSPITAL_COMMUNITY): Payer: Self-pay

## 2017-10-11 DIAGNOSIS — N6321 Unspecified lump in the left breast, upper outer quadrant: Secondary | ICD-10-CM | POA: Diagnosis not present

## 2017-10-11 DIAGNOSIS — N6489 Other specified disorders of breast: Secondary | ICD-10-CM | POA: Diagnosis present

## 2017-10-11 DIAGNOSIS — N649 Disorder of breast, unspecified: Secondary | ICD-10-CM

## 2017-10-11 DIAGNOSIS — R590 Localized enlarged lymph nodes: Secondary | ICD-10-CM | POA: Insufficient documentation

## 2017-10-11 DIAGNOSIS — N6320 Unspecified lump in the left breast, unspecified quadrant: Secondary | ICD-10-CM

## 2017-10-11 MED ORDER — LIDOCAINE HCL (PF) 1 % IJ SOLN
INTRAMUSCULAR | Status: AC
  Administered 2017-10-11: 10 mL
  Filled 2017-10-11: qty 15

## 2017-10-18 ENCOUNTER — Ambulatory Visit: Payer: Medicaid Other | Admitting: Family Medicine

## 2017-10-25 ENCOUNTER — Encounter: Payer: Self-pay | Admitting: Family Medicine

## 2017-10-25 ENCOUNTER — Ambulatory Visit (INDEPENDENT_AMBULATORY_CARE_PROVIDER_SITE_OTHER): Payer: Medicaid Other | Admitting: Family Medicine

## 2017-10-25 ENCOUNTER — Other Ambulatory Visit: Payer: Self-pay

## 2017-10-25 VITALS — BP 132/76 | HR 72 | Temp 98.5°F | Resp 16 | Ht 67.0 in | Wt 231.8 lb

## 2017-10-25 DIAGNOSIS — M10071 Idiopathic gout, right ankle and foot: Secondary | ICD-10-CM | POA: Diagnosis not present

## 2017-10-25 DIAGNOSIS — R059 Cough, unspecified: Secondary | ICD-10-CM

## 2017-10-25 DIAGNOSIS — R042 Hemoptysis: Secondary | ICD-10-CM

## 2017-10-25 DIAGNOSIS — K219 Gastro-esophageal reflux disease without esophagitis: Secondary | ICD-10-CM

## 2017-10-25 DIAGNOSIS — R0602 Shortness of breath: Secondary | ICD-10-CM

## 2017-10-25 DIAGNOSIS — I10 Essential (primary) hypertension: Secondary | ICD-10-CM | POA: Diagnosis not present

## 2017-10-25 DIAGNOSIS — H6123 Impacted cerumen, bilateral: Secondary | ICD-10-CM | POA: Diagnosis not present

## 2017-10-25 DIAGNOSIS — R05 Cough: Secondary | ICD-10-CM

## 2017-10-25 DIAGNOSIS — M62838 Other muscle spasm: Secondary | ICD-10-CM | POA: Diagnosis not present

## 2017-10-25 DIAGNOSIS — Z9889 Other specified postprocedural states: Secondary | ICD-10-CM

## 2017-10-25 DIAGNOSIS — F419 Anxiety disorder, unspecified: Secondary | ICD-10-CM

## 2017-10-25 MED ORDER — METOPROLOL TARTRATE 100 MG PO TABS: 100.0000 mg | ORAL_TABLET | Freq: Two times a day (BID) | ORAL | 3 refills | Status: DC

## 2017-10-25 MED ORDER — CYCLOBENZAPRINE HCL 10 MG PO TABS: 10.0000 mg | ORAL_TABLET | Freq: Three times a day (TID) | ORAL | 0 refills | Status: DC | PRN

## 2017-10-25 MED ORDER — OMEPRAZOLE 40 MG PO CPDR: 40.0000 mg | DELAYED_RELEASE_CAPSULE | Freq: Every day | ORAL | 1 refills | Status: DC

## 2017-10-25 MED ORDER — FLUOXETINE HCL 40 MG PO CAPS: 40.0000 mg | ORAL_CAPSULE | Freq: Every day | ORAL | 1 refills | Status: DC

## 2017-10-25 MED ORDER — INDOMETHACIN 25 MG PO CAPS: ORAL_CAPSULE | ORAL | 1 refills | Status: DC

## 2017-10-25 NOTE — Progress Notes (Signed)
Patient ID: Toni [name], female    DOB: 1956/09/13, 61 y.o.   MRN: [id number]  No chief complaint on file.   Allergies Iodinated diagnostic agents  Subjective:   Toni [name] is a 61 y.o. female who presents to [facility] today.  HPI Toni [name] patient is today for follow-up and to discuss her breathing.  Reports that over the past couple months has been getting increasingly SOB with exertion.  Reports that she had symptoms of breathing troubles before she had her valve replacement surgery but after the surgery the symptoms subsequently resolved.  She denies any chest pain no orthopnea.  Reports that in the morning upon awakening that she coughs up a dime size amount of blood every morning.  Reports that she has been coughing this up for the past month.  Reports that it can occasionally occur once throughout the day but very rarely.  Denies any skin rashes.  No nausea vomiting or diarrhea.  Denies any hematochezia or melena.  Has been taking all of her medications as directed.  Reports that her mood is well controlled on her Prozac.  Sleeping fair.  Reports some mild anxiety symptoms since having to undergo the procedures for her breast lesion. Reports that over the past couple weeks she has not been able to hear very well.  Reports she has not had this problem before but the sounds are muffled in both ears.  She reports that her hearing is worse in the left than in the right.  Denies any drainage from her ears.  Denies any ear pain.  Denies any nasal congestion.  Patient reports that she does not get any shortness of breath at rest.  She denies waking up short of breath from sleep.  She denies any swelling in her calf.  This did not occur as an acute symptom but rather gradual onset over the past couple months which seems to be worsening per her report.  Nothing seems to make it better or worse.  She does rest when her symptoms occur and it seems to improve.  Reports she has  not followed up with cardiology in over a year.  Had her last echo in 2017.  Reports that she has a follow-up coming up regarding her breast lesion and enlarged lymph nodes.       Shortness of Breath  This is a new problem. The current episode started more than 1 month ago (2-3). The problem has been gradually worsening. Pertinent negatives include no chest pain, claudication, coryza, ear pain, fever, headaches, leg pain, leg swelling, neck pain, orthopnea, sore throat, sputum production, swollen glands, syncope or wheezing. The symptoms are aggravated by exercise and any activity. She has tried rest for the symptoms. The treatment provided mild relief.    Past Medical History:  Diagnosis Date  . Congestive heart failure, unspecified    Diastolic heart failure  . Degeneration of lumbar or lumbosacral intervertebral disc   . Depression   . Diverticulosis   . Dyspnea     chronic multifactorial large component of deconditioning  . Esophageal reflux   . Insomnia    Working third shift  . Mitral stenosis    Secondary to under-sized ring annuloplasty following mitral valve repair  . Mitral valve disorders(424.0)    Mild to moderate mitral regurgitation  . Mitral valve insufficiency and aortic valve insufficiency   . Morbid obesity ([place])   . Normocytic anemia   . Other chest pain   .  Other dyspnea and respiratory abnormality   . Paroxysmal atrial fibrillation ([name])    Stable no recurrence  . Peripheral edema    chronic  . S/P Maze operation for atrial fibrillation 02/27/2015  . S/P mitral valve repair 02/27/2015   "radical repair" including resection of P2 segment of posterior leaflet with 26 mm Edwards Physio II annuloplasty ring  . S/P tricuspid valve repair 02/27/2015   26 mm annuloplasty ring  . Unspecified essential hypertension     Past Surgical History:  Procedure Laterality Date  . APPENDECTOMY    . BREAST BIOPSY Left 2009  . CARDIAC VALVE SURGERY  02/27/2015   Mikel Cella.  Mitral Valve Repair. Radical Repair with resection of P2 Prolapse, 26 mm Physio II Ring Annuloplasty, Tricuspid Valve Repair (26 mm Ring Annuloplasty, Cox Maze IV Procedure on 02/27/2015  . CHOLECYSTECTOMY    . COLONOSCOPY  2011   Dr. Laural Golden: 64mm polyp from hepatic flexure (tubular adenoma)  . COLONOSCOPY N/A 11/10/2016   Dr. Gala Romney: Diverticulosis. Next colonoscopy 7 years, January 2025  . ESOPHAGOGASTRODUODENOSCOPY  2005    Dr. Gala Romney : normal esophagus s/p dilation. Small hiatal hernia noted. Felt improved after dilation.   . ESOPHAGOGASTRODUODENOSCOPY N/A 11/10/2016   Dr. Gala Romney: Moderate-sized hiatal hernia, esophagus normal status post empiric dilation for history of dysphagia  . HEEL SPUR SURGERY Right   . MALONEY DILATION N/A 11/10/2016   Procedure: Venia Minks DILATION;  Surgeon: Daneil Dolin, MD;  Location: [facility];  Service: Endoscopy;  Laterality: N/A;  . MINIMALLY INVASIVE TRICUSPID VALVE REPAIR  02/27/2015   [place]  . MITRAL VALVE REPAIR  02/27/2015   [place]  . TEE WITHOUT CARDIOVERSION N/A 10/18/2016   Procedure: TRANSESOPHAGEAL ECHOCARDIOGRAM (TEE);  Surgeon: Arnoldo Lenis, MD;  Location: [facility];  Service: Endoscopy;  Laterality: N/A;  . TOTAL ABDOMINAL HYSTERECTOMY    . TOTAL KNEE ARTHROPLASTY Right   . TUBAL LIGATION      Family History  Problem Relation Age of Onset  . Hypertension Mother   . Heart disease Mother   . Kidney disease Father   . Stroke Father   . Diabetes Sister   . Kidney disease Sister   . Heart disease Sister   . Hypertension Sister   . Heart disease Brother   . Heart attack Son   . Hypertension Son   . Cancer Paternal Grandmother        Breast Cancer  . Heart disease Brother   . Colon cancer Neg Hx      Social History   Socioeconomic History  . Marital status: Single    Spouse name: None  . Number of children: None  . Years of education: None  . Highest education  level: None  Social Needs  . Financial resource strain: None  . Food insecurity - worry: None  . Food insecurity - inability: None  . Transportation needs - medical: None  . Transportation needs - non-medical: None  Occupational History  . None  Tobacco Use  . Smoking status: Former Smoker    Packs/day: 0.50    Years: 25.00    Pack years: 12.50    Types: Cigarettes    Last attempt to quit: 1997    Years since quitting: 21.9  . Smokeless tobacco: Former Systems developer    Types: Chew    Quit date: 80  . Tobacco comment: dipped tobacco during pregancy in 1975  Substance and Sexual Activity  . Alcohol  use: No  . Drug use: No  . Sexual activity: Yes    Partners: Male  Other Topics Concern  . None  Social History Narrative   Lives alone or son will stay with her at times. Cooks. Eats well.    Current Outpatient Medications on File Prior to Visit  Medication Sig Dispense Refill  . albuterol (PROVENTIL HFA;VENTOLIN HFA) 108 (90 Base) MCG/ACT inhaler Inhale 2 puffs into the lungs every 6 (six) hours as needed for wheezing or shortness of breath. 1 Inhaler 3  . amLODipine (NORVASC) 5 MG tablet Take 1 tablet (5 mg total) by mouth daily. 90 tablet 3  . aspirin 325 MG EC tablet Take 325 mg by mouth daily.    Marland Kitchen gabapentin (NEURONTIN) 300 MG capsule Take one tablet in the morning and three tablets in the evening as directed. 270 capsule 1  . linaclotide (LINZESS) 72 MCG capsule Take 1 capsule (72 mcg total) by mouth daily before breakfast. 90 capsule 3  . Liniments (SALONPAS PAIN RELIEF PATCH EX) Apply 1 patch topically daily as needed (pain).    . magnesium gluconate (MAGONATE) 500 MG tablet Take 500 mg by mouth daily.    Marland Kitchen torsemide (DEMADEX) 20 MG tablet Take 1 tablet (20 mg total) by mouth daily. 90 tablet 3  . lisinopril (PRINIVIL,ZESTRIL) 20 MG tablet Take 1 tablet (20 mg total) by mouth daily. 90 tablet 3   No current facility-administered medications on file prior to visit.     Review  of Systems  Constitutional: Negative for fever.  HENT: Negative for ear pain and sore throat.   Respiratory: Positive for shortness of breath. Negative for sputum production and wheezing.   Cardiovascular: Negative for chest pain, orthopnea, claudication, leg swelling and syncope.  Musculoskeletal: Negative for neck pain.  Neurological: Negative for headaches.     Objective:   BP 132/76 (BP Location: Left Arm, Patient Position: Sitting, Cuff Size: Normal)   Pulse 72   Temp 98.5 F (36.9 C) (Other (Comment))   Resp 16   Ht 5\' 7"  (1.702 m)   Wt 231 lb 12 oz (105.1 kg)   LMP  (LMP Unknown)   SpO2 96%   BMI 36.30 kg/m   Physical Exam  Constitutional: She is oriented to person, place, and time. She appears well-developed and well-nourished. No distress.  HENT:  Head: Normocephalic and atraumatic.  Initially bilateral ear canals obstructed with cerumen bilaterally.  Right ear canal irrigated and large plug of wax removed.  Left ear canal irrigated and after curetting wax removed.  Status post irrigation and curetting tympanic membranes visualized.  Hearing restored.  No pain or trauma experienced during irrigation.  Eyes: Pupils are equal, round, and reactive to light.  Neck: Normal range of motion. Neck supple. No thyromegaly present.  Cardiovascular: Normal rate, regular rhythm and normal heart sounds.  Pulmonary/Chest: Effort normal and breath sounds normal. No respiratory distress.  Neurological: She is alert and oriented to person, place, and time. No cranial nerve deficit.  Skin: Skin is warm and dry.  Nursing note and vitals reviewed.    Assessment and Plan   1. Essential hypertension Controlled.  Continue medication as directed. - metoprolol tartrate (LOPRESSOR) 100 MG tablet; Take 1 tablet (100 mg total) by mouth 2 (two) times daily.  Dispense: 180 tablet; Refill: 3  2. Idiopathic gout of right foot, unspecified chronicity Refilled medications per patient request.   Did discuss possible risks and side effects of medication.  Sporadic, as needed  use recommended.Discussed risks of cardiovascular thrombotic events related to NSAIDS. Discussed increased risk of AMI and CVA. Discussed risk of serious GI adverse events including bleeding, ulcers, and perforation. Patient understands risks of this medication.   - indomethacin (INDOCIN) 25 MG capsule; 1 po q 8 hour prn gout pain  Dispense: 20 capsule; Refill: 1  3. Gastroesophageal reflux disease without esophagitis Refilled medication.  Reflux precautions discussed. - omeprazole (PRILOSEC) 40 MG capsule; Take 1 capsule (40 mg total) by mouth daily.  Dispense: 90 capsule; Refill: 1  4. Anxiety Stable.  Continue medication as directed.  Counseled if experiences changes in mood or worrisome signs and symptoms to please call or contact medical help. - FLUoxetine (PROZAC) 40 MG capsule; Take 1 capsule (40 mg total) by mouth daily.  Dispense: 30 capsule; Refill: 1  5. SOB (shortness of breath) on exertion Patient with new symptoms of dyspnea on exertion.  Suspect symptoms most likely secondary to cardiac etiology since she has had recent CT scan of the chest which was within normal limits.  She does not have a history of tobacco abuse or occupational exposure which puts her at risk for COPD.  Will refer to pulmonary.  In addition also follow-up with cardiology.  We will go ahead and check an echo at this time to monitor for worsening valve function or symptoms of failure.  She has no physical exam findings of overt cardiac failure today.  She was counseled concerning worrisome signs and symptoms and if they do develop she should go to the emergency department or call 911.  She voiced understanding. - Ambulatory referral to Cardiology - ECHOCARDIOGRAM COMPLETE; Future - DG Chest 2 View; Future  6. S/P mitral valve repair  - Ambulatory referral to Cardiology  7. Cough Patient had recent CT scan of the chest within the  past 3 months.  It was reviewed.  However in light of new symptoms we will check chest x-ray today. - DG Chest 2 View; Future - Ambulatory referral to Pulmonology  8. Cough with hemoptysis Patient has previously been seen by Dr. Charmayne Sheer.  Will place referral for her to see him again.  SPECT with symptoms patient needs new evaluation and possible bronchoscopy/evaluation.  9. Muscle spasm Chronic, lower back muscle spasms.  Heat to area 20 minutes 3 times daily as directed.  Patient was counseled concerning sedation side effects of medication.  She voiced understanding that she should not drive with this medication.  In addition, she was counseled this medication does increase her fall risk. - cyclobenzaprine (FLEXERIL) 10 MG tablet; Take 1 tablet (10 mg total) by mouth 3 (three) times daily as needed for muscle spasms.  Dispense: 30 tablet; Refill: 0  10. Bilateral impacted cerumen Ear care discussed with patient.  Symptoms resolved after lavage. - Ear Lavage Patient understands to keep her appointment with cardiology, pulmonary, and general surgery.  She was given a record of these appointments today.  Return in about 4 weeks (around 11/22/2017) for Follow-up. Caren Macadam, MD 10/25/2017

## 2017-10-27 ENCOUNTER — Ambulatory Visit (INDEPENDENT_AMBULATORY_CARE_PROVIDER_SITE_OTHER): Payer: Medicaid Other | Admitting: General Surgery

## 2017-10-27 ENCOUNTER — Encounter: Payer: Self-pay | Admitting: General Surgery

## 2017-10-27 VITALS — BP 136/69 | HR 75 | Temp 97.8°F | Resp 18 | Ht 67.0 in | Wt 234.0 lb

## 2017-10-27 DIAGNOSIS — R590 Localized enlarged lymph nodes: Secondary | ICD-10-CM | POA: Diagnosis not present

## 2017-10-27 DIAGNOSIS — N6321 Unspecified lump in the left breast, upper outer quadrant: Secondary | ICD-10-CM

## 2017-10-27 DIAGNOSIS — R928 Other abnormal and inconclusive findings on diagnostic imaging of breast: Secondary | ICD-10-CM | POA: Diagnosis not present

## 2017-10-27 NOTE — Progress Notes (Signed)
[facility] History and Physical  Reason for Referral: Left breast mass and left axillary adenopathy, with discordant biopsies  Referring Physician: Dr. Mannie Stabile  Chief Complaint    Breast Mass; Adenopathy      Toni [name] is a 60 y.o. female.  HPI: Ms. Fleisher is a very pleasant 61 yo with CHF, mitral valve repair/ tricuspid valve repair, who underwent a CT of her chest a few months ago that revealed adenopathy in the left axillary area and the pectoral area. She underwent a mammogram that was suspicious and an US guided biopsy of the adenopathy and the breast mass with results showing reactive lymphoid tissue, no signs of melanoma, and a breast biopsy with fibrocystic changes. The radiologist reviewed the biopsy results and feels that this is discordant with the radiographic findings.  The patient has had a history of a prior biopsy of the left breast in the past in the upper outer quadrant.  This was done at [facility] and did not require any further treatments. She says she has had mammograms following that biopsy that were fine but these are not in our system. She experienced menarche was at age 68 years, and her first child was born when she was 39 years old. She did not breast feed. She has 2 paternal aunts with breast cancer in the past.   Past Medical History:  Diagnosis Date  . Congestive heart failure, unspecified    Diastolic heart failure  . Degeneration of lumbar or lumbosacral intervertebral disc   . Depression   . Diverticulosis   . Dyspnea     chronic multifactorial large component of deconditioning  . Esophageal reflux   . Insomnia    Working third shift  . Mitral stenosis    Secondary to under-sized ring annuloplasty following mitral valve repair  . Mitral valve disorders(424.0)    Mild to moderate mitral regurgitation  . Mitral valve insufficiency and aortic valve insufficiency   . Morbid obesity ([place])   . Normocytic anemia   . Other chest pain   .  Other dyspnea and respiratory abnormality   . Paroxysmal atrial fibrillation ([name])    Stable no recurrence  . Peripheral edema    chronic  . S/P Maze operation for atrial fibrillation 02/27/2015  . S/P mitral valve repair 02/27/2015   "radical repair" including resection of P2 segment of posterior leaflet with 26 mm Edwards Physio II annuloplasty ring  . S/P tricuspid valve repair 02/27/2015   26 mm annuloplasty ring  . Unspecified essential hypertension    Cardiac Surgery- 02/2015 At [facility] -records reviewed  -MITRAL VALVE REPAIR/RF AND CRYO MAZE PROCEDURE/EXCISION OF LEFT ATRIAL APPENDAGE/TRICUSPID VALVE REPAIR (N/A) secondary to MITRAL VALVE INSUFFICIENCY UNSPECIFIED ETIOLOGY   Past Surgical History:  Procedure Laterality Date  . APPENDECTOMY    . BREAST BIOPSY Left 2009  . CARDIAC VALVE SURGERY  02/27/2015   Mikel Cella. Mitral Valve Repair. Radical Repair with resection of P2 Prolapse, 26 mm Physio II Ring Annuloplasty, Tricuspid Valve Repair (26 mm Ring Annuloplasty, Cox Maze IV Procedure on 02/27/2015  . CHOLECYSTECTOMY    . COLONOSCOPY  2011   Dr. Laural Golden: 2mm polyp from hepatic flexure (tubular adenoma)  . COLONOSCOPY N/A 11/10/2016   Dr. Gala Romney: Diverticulosis. Next colonoscopy 7 years, January 2025  . ESOPHAGOGASTRODUODENOSCOPY  2005    Dr. Gala Romney : normal esophagus s/p dilation. Small hiatal hernia noted. Felt improved after dilation.   . ESOPHAGOGASTRODUODENOSCOPY N/A 11/10/2016   Dr. Gala Romney: Moderate-sized hiatal hernia, esophagus  normal status post empiric dilation for history of dysphagia  . HEEL SPUR SURGERY Right   . MALONEY DILATION N/A 11/10/2016   Procedure: Venia Minks DILATION;  Surgeon: Daneil Dolin, MD;  Location: [facility];  Service: Endoscopy;  Laterality: N/A;  . MINIMALLY INVASIVE TRICUSPID VALVE REPAIR  02/27/2015   [place]  . MITRAL VALVE REPAIR  02/27/2015   [place]  . TEE WITHOUT CARDIOVERSION N/A 10/18/2016    Procedure: TRANSESOPHAGEAL ECHOCARDIOGRAM (TEE);  Surgeon: Arnoldo Lenis, MD;  Location: [facility];  Service: Endoscopy;  Laterality: N/A;  . TOTAL ABDOMINAL HYSTERECTOMY    . TOTAL KNEE ARTHROPLASTY Right   . TUBAL LIGATION      Family History  Problem Relation Age of Onset  . Hypertension Mother   . Heart disease Mother   . Kidney disease Father   . Stroke Father   . Diabetes Sister   . Kidney disease Sister   . Heart disease Sister   . Hypertension Sister   . Heart disease Brother   . Heart attack Son   . Hypertension Son   . Cancer Paternal Grandmother        Breast Cancer  . Heart disease Brother   . Colon cancer Neg Hx     Social History   Tobacco Use  . Smoking status: Former Smoker    Packs/day: 0.50    Years: 25.00    Pack years: 12.50    Types: Cigarettes    Last attempt to quit: 1997    Years since quitting: 21.9  . Smokeless tobacco: Former Systems developer    Types: Chew    Quit date: 25  . Tobacco comment: dipped tobacco during pregancy in 1975  Substance Use Topics  . Alcohol use: No  . Drug use: No    Medications: I have reviewed the patient's current medications. Allergies as of 10/27/2017      Reactions   Iodinated Diagnostic Agents Swelling   EYES      Medication List        Accurate as of 10/27/17  4:42 PM. Always use your most recent med list.          albuterol 108 (90 Base) MCG/ACT inhaler Commonly known as:  PROVENTIL HFA;VENTOLIN HFA Inhale 2 puffs into the lungs every 6 (six) hours as needed for wheezing or shortness of breath.   amLODipine 5 MG tablet Commonly known as:  NORVASC Take 1 tablet (5 mg total) by mouth daily.   aspirin 325 MG EC tablet Take 325 mg by mouth daily.   cyclobenzaprine 10 MG tablet Commonly known as:  FLEXERIL Take 1 tablet (10 mg total) by mouth 3 (three) times daily as needed for muscle spasms.   FLUoxetine 40 MG capsule Commonly known as:  PROZAC Take 1 capsule (40 mg total) by mouth  daily.   gabapentin 300 MG capsule Commonly known as:  NEURONTIN Take one tablet in the morning and three tablets in the evening as directed.   indomethacin 25 MG capsule Commonly known as:  INDOCIN 1 po q 8 hour prn gout pain   linaclotide 72 MCG capsule Commonly known as:  LINZESS Take 1 capsule (72 mcg total) by mouth daily before breakfast.   lisinopril 20 MG tablet Commonly known as:  PRINIVIL,ZESTRIL Take 1 tablet (20 mg total) by mouth daily.   magnesium gluconate 500 MG tablet Commonly known as:  MAGONATE Take 500 mg by mouth daily.  metoprolol tartrate 100 MG tablet Commonly known as:  LOPRESSOR Take 1 tablet (100 mg total) by mouth 2 (two) times daily.   omeprazole 40 MG capsule Commonly known as:  PRILOSEC Take 1 capsule (40 mg total) by mouth daily.   SALONPAS PAIN RELIEF PATCH EX Apply 1 patch topically daily as needed (pain).   torsemide 20 MG tablet Commonly known as:  DEMADEX Take 1 tablet (20 mg total) by mouth daily.        ROS:  A comprehensive review of systems was negative except for: Eyes: positive for eye pain and double vision Respiratory: positive for SOB Cardiovascular: positive for HTN Musculoskeletal: positive for back pain and stiff joints Neurological: positive for dizziness Endocrine: positive for hot/ cold intolerance, sluggish and tired  Blood pressure 136/69, pulse 75, temperature 97.8 F (36.6 C), resp. rate 18, height 5\' 7"  (1.702 m), weight 234 lb (106.1 kg). Physical Exam  Constitutional: She is oriented to person, place, and time and well-developed, well-nourished, and in no distress.  HENT:  Head: Normocephalic.  Eyes: Pupils are equal, round, and reactive to light.  Neck: Normal range of motion.  Cardiovascular: Normal rate.  Pulmonary/Chest: Effort normal and breath sounds normal.  Right breast without masses, no nipple discharge or drainage, no adenopathy on right; left breast with upper outer scar and induration  over the scar, left axillary adenopathy  Abdominal: Soft. She exhibits no distension. There is no tenderness.  Musculoskeletal: Normal range of motion. She exhibits edema.  No inguinal adenopathy bilaterally   Lymphadenopathy:    She has no cervical adenopathy.  Neurological: She is alert and oriented to person, place, and time.  Skin: Skin is dry.  Psychiatric: Mood, memory, affect and judgment normal.  Vitals reviewed.   Results: CT 08/2017  Personally reviewed- left axillary adenopathy appreciated, dense breast tissue IMPRESSION: 1. No acute findings or explanation for the patient's symptoms identified. 2. Asymmetric left axillary and subpectoral lymphadenopathy. Potential explanations include metastatic breast cancer and lymphoproliferative process. Diagnostic mammography recommended if not recently performed. 3. Stable cardiomegaly post mitral and tricuspid valve surgery. 4.  Aortic Atherosclerosis (ICD10-I70.0). 5. These results will be called to the ordering clinician or representative by the Radiologist Assistant, and communication documented in the PACS or Vision Dashboard.  Mammogram/US 09/2017  Multiple enlarged left axillary lymph nodes corresponding well to the recent CT exam. The largest lymph node demonstrates markedly thickened cortex and measures 3.3 cm in length. Nodes are associated with hypervascularity. Nodes extend into the retropectoral region. At real-time scanning, I count at least 7 abnormal lymph nodes.  Within the 12:30 o'clock location left breast 3 cm from the nipple, just medial and discrete from the surgical scar, there is an irregular mass associated with dense acoustic shadowing measuring 1.2 x 1.1 x 0.8 cm. IMPRESSION: 1. Suspicious mass in the 12:30 o'clock location of the left breast. Although this mass appears discrete from the previous benign surgical excisional biopsy scar, considerations include fat necrosis or malignancy. 2.  Enlarged left axillary lymph nodes, suspicious for metastatic disease or lymphoproliferative disease  US guided biopsy 12/4 with clip placement   Pathology: Discordant per the radiologist with imaging  1. Breast, left, needle core biopsy, 12:30; 3 cmn - FIBROCYSTIC CHANGES. - NO EVIDENCE OF MALIGNANCY. 2. Lymph node, needle/core biopsy, left axilla - REACTIVE LYMPHOID TISSUE. Tissue-Flow Cytometry - NO MONOCLONAL B-CELL OR PHENOTYPICALLY ABERRANT T-CELL POPULATION IDENTIFIED.  Risk Calculator- 5 year risk 1.7% of breast cancer (with unknown biopsy results from  left breast prior); If atypical hyperplasia on that prior biospy then risk is 3.1% Life time risk 6.8% of breast cancer (with unknown biopsy results from the left breast prior); If atypical hyperplasia on that prior biospy then risk is 11.9%   Assessment & Plan:  ELLAREE GEAR is a 61 y.o. female with a left breast mass that is approximate 1.5x1cm and axillary adenopathy measuring up to 3.3cm that has biopsy results that are discordant with her imaging study. She has a slightly increased risk above the average population with her prior biopsy and could have an even higher risk if there was any atypical hyperplasia in the biopsy but we do not have these results. There is definitely a scar from her prior excisional biopsy that I appreciate on exam but based on the imaging report this is separate from the lesion in question that was biopsied and a clip was placed. The patient also has multiple lymph nodes that are enlarged and suspicious at this time.  The patient also has a history of cardiac issues with CHF and mitral and tricuspid repair 02/2015 at [facility]. She is only on aspirin 325mg .  She has not seen cardiology in several months and has an ECHO ordered for 12/28 and has been having some SOB symptoms.    -Cardiology appt 1/8, and need to include a risk stratification for surgery; will send request to cardiology  -Once cardiology has  seen the patient will need to bring back to discuss surgery -Partial mastectomy after needle localization (to ensure removing the biopsy clip and not just scar from prior biopsy site) and full axillary node dissection given the clinically aparent adenopathy and concern on imaging; she has already had a lymph node biopsy and a sentinel node is not indicated in this situation  -Will likely see her in the first few weeks of January after cardiology has evaluated   All questions were answered to the satisfaction of the patient.  Discussed the risk and benefits of surgery for her left breast mass and adenopathy including but not limited to bleeding, infection, lymphedema, need for further surgeries, potential of finding cancer that would require radiation/ chemotherapy.  We will discuss further at the next visit.   [name] 10/27/2017, 4:42 PM

## 2017-10-28 ENCOUNTER — Other Ambulatory Visit (HOSPITAL_COMMUNITY): Payer: Medicaid Other

## 2017-11-02 ENCOUNTER — Ambulatory Visit: Payer: Medicaid Other | Admitting: Family Medicine

## 2017-11-03 ENCOUNTER — Other Ambulatory Visit: Payer: Self-pay

## 2017-11-03 DIAGNOSIS — I10 Essential (primary) hypertension: Secondary | ICD-10-CM

## 2017-11-03 MED ORDER — METOPROLOL TARTRATE 100 MG PO TABS: 100.0000 mg | ORAL_TABLET | Freq: Two times a day (BID) | ORAL | 1 refills | Status: DC

## 2017-11-04 ENCOUNTER — Ambulatory Visit (HOSPITAL_COMMUNITY)
Admission: RE | Admit: 2017-11-04 | Discharge: 2017-11-04 | Disposition: A | Discharge status: Home / Self Care | Payer: Medicaid Other | Source: Ambulatory Visit | Attending: Family Medicine | Admitting: Family Medicine

## 2017-11-04 DIAGNOSIS — I081 Rheumatic disorders of both mitral and tricuspid valves: Secondary | ICD-10-CM | POA: Insufficient documentation

## 2017-11-04 DIAGNOSIS — R059 Cough, unspecified: Secondary | ICD-10-CM

## 2017-11-04 DIAGNOSIS — I517 Cardiomegaly: Secondary | ICD-10-CM | POA: Diagnosis not present

## 2017-11-04 DIAGNOSIS — R05 Cough: Secondary | ICD-10-CM

## 2017-11-04 DIAGNOSIS — I4891 Unspecified atrial fibrillation: Secondary | ICD-10-CM | POA: Diagnosis not present

## 2017-11-04 DIAGNOSIS — R0602 Shortness of breath: Secondary | ICD-10-CM

## 2017-11-04 DIAGNOSIS — I10 Essential (primary) hypertension: Secondary | ICD-10-CM | POA: Diagnosis not present

## 2017-11-04 LAB — ECHOCARDIOGRAM COMPLETE
AO mean calculated velocity dopler: 78.2 cm/s
AV Area VTI index: 0.97 cm2/m2
AV Area VTI: 2.12 cm2
AV Area mean vel: 2.43 cm2
AV Mean grad: 3 mmHg
AV Peak grad: 7 mmHg
AV VEL mean LVOT/AV: 0.77
AV area mean vel ind: 1.06 cm2/m2
AV peak Index: 0.93
AV pk vel: 131 cm/s
AV vel: 2.21
Ao pk vel: 0.67 m/s
Area-P 1/2: 1.67 cm2
E decel time: 489 msec
E/e' ratio: 47.46
FS: 21 % — AB (ref 28–44)
IVS/LV PW RATIO, ED: 1.04
LA ID, A-P, ES: 58 mm
LA diam end sys: 58 mm
LA diam index: 2.54 cm/m2
LA vol A4C: 202 ml
LA vol index: 81 mL/m2
LA vol: 185 mL
LV E/e' medial: 47.46
LV E/e'average: 47.46
LV PW d: 11.3 mm — AB (ref 0.6–1.1)
LV dias vol index: 58 mL/m2
LV dias vol: 132 mL — AB (ref 46–106)
LV e' LATERAL: 4.53 cm/s
LV sys vol index: 33 mL/m2
LV sys vol: 75 mL — AB (ref 14–42)
LVOT MV VTI INDEX: 0.3 cm2/m2
LVOT MV VTI: 0.68
LVOT SV: 65 mL
LVOT VTI: 20.8 cm
LVOT area: 3.14 cm2
LVOT diameter: 20 mm
LVOT peak VTI: 0.7 cm
LVOT peak grad rest: 3 mmHg
LVOT peak vel: 88.4 cm/s
Lateral S' vel: 6.18 cm/s
MV Annulus VTI: 96 cm
MV Dec: 489
MV M vel: 148
MV Peak grad: 18 mmHg
MV pk A vel: 69.8 m/s
MV pk E vel: 215 m/s
Mean grad: 11 mmHg
P 1/2 time: 132 ms
RV sys press: 58 mmHg
Reg peak vel: 328 cm/s
Simpson's disk: 44
Stroke v: 57 ml
TAPSE: 14.1 mm
TDI e' lateral: 4.53
TDI e' medial: 3.44
TR max vel: 328 cm/s
VTI: 29.6 cm
Valve area index: 0.97
Valve area: 2.21 cm2

## 2017-11-04 NOTE — Progress Notes (Signed)
*  PRELIMINARY RESULTS* Echocardiogram 2D Echocardiogram has been performed.  Toni [name] 11/04/2017, 2:33 PM

## 2017-11-10 ENCOUNTER — Ambulatory Visit (INDEPENDENT_AMBULATORY_CARE_PROVIDER_SITE_OTHER): Payer: Medicaid Other | Admitting: Gastroenterology

## 2017-11-10 ENCOUNTER — Encounter: Payer: Self-pay | Admitting: Gastroenterology

## 2017-11-10 VITALS — BP 114/69 | HR 70 | Temp 98.1°F | Ht 67.0 in | Wt 239.2 lb

## 2017-11-10 DIAGNOSIS — K219 Gastro-esophageal reflux disease without esophagitis: Secondary | ICD-10-CM | POA: Diagnosis not present

## 2017-11-10 DIAGNOSIS — K5900 Constipation, unspecified: Secondary | ICD-10-CM | POA: Diagnosis not present

## 2017-11-10 DIAGNOSIS — R0989 Other specified symptoms and signs involving the circulatory and respiratory systems: Secondary | ICD-10-CM

## 2017-11-10 DIAGNOSIS — R198 Other specified symptoms and signs involving the digestive system and abdomen: Secondary | ICD-10-CM

## 2017-11-10 DIAGNOSIS — F458 Other somatoform disorders: Secondary | ICD-10-CM | POA: Diagnosis not present

## 2017-11-10 MED ORDER — LINACLOTIDE 72 MCG PO CAPS: 72.0000 ug | ORAL_CAPSULE | Freq: Every day | ORAL | 3 refills | Status: DC

## 2017-11-10 NOTE — Progress Notes (Signed)
CC'ED TO PCP 

## 2017-11-10 NOTE — Assessment & Plan Note (Signed)
Fairly stable on omeprazole 40 mg daily.  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  Currently being worked up for breathing concerns, possible pulmonary versus cardiac in etiology.  On doxycycline for blood-tinged sputum.  Has follow-up planned with both pulmonology and cardiology.  Continue swallowing precautions.  She food thoroughly.  Reinforced antireflux measures.

## 2017-11-10 NOTE — Assessment & Plan Note (Signed)
Continue Linzess 72 mcg daily.  Prescription sent to pharmacy.

## 2017-11-10 NOTE — Patient Instructions (Signed)
1. Continue omeprazole 40 mg daily, 30 minutes before breakfast. 2. Continue Linzess 72 mcg daily, 30 minutes before breakfast.  Prescription sent to pharmacy. 3. Return to the office in 6 months or call sooner if needed. 4. You will be due for labs to follow-up for anemia in 01/2018.

## 2017-11-10 NOTE — Progress Notes (Signed)
Primary Care Physician: Caren Macadam, MD  Primary Gastroenterologist:  Garfield Cornea, MD   Chief Complaint  Patient presents with  . Dysphagia    feels like something in throat  . Constipation    if doesn't take Linzess; needs refill    HPI: Toni [name] is a 62 y.o. female here for follow-up.  History of dysphagia, constipation, anemia.  Last seen July 2018.  Last EGD and colonoscopy in January 2018.  She has a personal history of colon polyps.  On colonoscopy she had diverticulosis.  Next colonoscopy planned for January 2025.  EGD performed for dysphagia.  She had a moderate sized hiatal hernia but the esophagus appeared normal.  Esophagus dilated for history of dysphagia.  Esophageal dilation only minimally dysphagia, fullness in throat.  July 2018 she had a barium esophagram showing mild prominence of the cricopharyngeus fold.  Esophagus widely patent.  No evidence of hiatal hernia or reflux.  Suspected anemia of chronic disease but completed I FOBT in August which was negative.  Follow-up hemoglobin in October was 11, normal MCV.  Due for repeat CBC, iron/ferritin in March.  Patient has been having some breathing issues.  Being worked up for both cardiac and pulmonary reasons.  Her by Dr. Luan Pulling and Dr. Harl Bowie.  History of tricuspid and mitral valve repair in 2016.  She had a chest CT which led to further evaluation of asymmetrical lymphadenopathy on the left side is suspicious left breast mass.  Patient reports biopsy showed no evidence of malignancy but there are plans to remove the mass to be on the safe side.  Currently on doxycycline for blood-tinged sputum.  This has improved dramatically.  Continues to feel fatigued and have dyspnea on exertion and she reports this may be cardiac in nature.    GI standpoint she feels pretty good.  She has a constant sensation of mucus in her throat.  No dysphagia.  Heartburn well controlled.  No abdominal pain.  Bowel movements are  regular with use of Linzess.  No melena or rectal bleeding.        Current Outpatient Medications  Medication Sig Dispense Refill  . albuterol (PROVENTIL HFA;VENTOLIN HFA) 108 (90 Base) MCG/ACT inhaler Inhale 2 puffs into the lungs every 6 (six) hours as needed for wheezing or shortness of breath. 1 Inhaler 3  . amLODipine (NORVASC) 5 MG tablet Take 1 tablet (5 mg total) by mouth daily. 90 tablet 3  . aspirin 325 MG EC tablet Take 325 mg by mouth daily.    . cyclobenzaprine (FLEXERIL) 10 MG tablet Take 1 tablet (10 mg total) by mouth 3 (three) times daily as needed for muscle spasms. 30 tablet 0  . doxycycline (VIBRAMYCIN) 100 MG capsule Take 100 mg by mouth 2 (two) times daily.  0  . FLUoxetine (PROZAC) 40 MG capsule Take 1 capsule (40 mg total) by mouth daily. 30 capsule 1  . gabapentin (NEURONTIN) 300 MG capsule Take one tablet in the morning and three tablets in the evening as directed. 270 capsule 1  . indomethacin (INDOCIN) 25 MG capsule 1 po q 8 hour prn gout pain 20 capsule 1  . linaclotide (LINZESS) 72 MCG capsule Take 1 capsule (72 mcg total) by mouth daily before breakfast. 90 capsule 3  . Liniments (SALONPAS PAIN RELIEF PATCH EX) Apply 1 patch topically daily as needed (pain).    Marland Kitchen lisinopril (PRINIVIL,ZESTRIL) 20 MG tablet Take 1 tablet (20 mg total) by mouth daily. [place]  tablet 3  . magnesium gluconate (MAGONATE) 500 MG tablet Take 500 mg by mouth daily.    . metoprolol tartrate (LOPRESSOR) 100 MG tablet Take 1 tablet (100 mg total) by mouth 2 (two) times daily. 180 tablet 1  . omeprazole (PRILOSEC) 40 MG capsule Take 1 capsule (40 mg total) by mouth daily. 90 capsule 1  . torsemide (DEMADEX) 20 MG tablet Take 1 tablet (20 mg total) by mouth daily. 90 tablet 3   No current facility-administered medications for this visit.     Allergies as of 11/10/2017 - Review Complete 11/10/2017  Allergen Reaction Noted  . Iodinated diagnostic agents Swelling 09/01/2011     ROS:  General: Negative for anorexia, weight loss, fever, chills, fatigue, weakness. ENT: Negative for hoarseness, difficulty swallowing , nasal congestion. CV: Negative for chest pain, angina, palpitations, dyspnea on exertion, peripheral edema.  Respiratory: Negative for dyspnea at rest, dyspnea on exertion, cough, sputum, wheezing.  GI: See history of present illness. GU:  Negative for dysuria, hematuria, urinary incontinence, urinary frequency, nocturnal urination.  Endo: Negative for unusual weight change.    Physical Examination:   BP 114/69   Pulse 70   Temp 98.1 F (36.7 C) (Oral)   Ht _0  (1.702 m)   Wt 239 lb 3.2 oz (108.5 kg)   LMP  (LMP Unknown)   BMI 37.46 kg/m   General: Well-nourished, well-developed in no acute distress.  Eyes: No icterus. Mouth: Oropharyngeal mucosa moist and pink , no lesions erythema or exudate. Lungs: Clear to auscultation bilaterally.  Heart: Regular rate and rhythm, no murmurs rubs or gallops.  Abdomen: Bowel sounds are normal, nontender, nondistended, no hepatosplenomegaly or masses, no abdominal bruits or hernia , no rebound or guarding.   Extremities: No lower extremity edema. No clubbing or deformities. Neuro: Alert and oriented x 4   Skin: Warm and dry, no jaundice.   Psych: Alert and cooperative, normal mood and affect.  Labs:  Lab Results  Component Value Date   CREATININE 1.10 (H) 08/17/2017   BUN 29 (H) 08/17/2017   NA 139 08/17/2017   K 4.0 08/17/2017   CL 104 08/17/2017   CO2 28 08/17/2017    Lab Results  Component Value Date   WBC 4.6 09/07/2017   HGB 11.0 (L) 09/07/2017   HCT 33.5 (L) 09/07/2017   MCV 91.5 09/07/2017   PLT 153 09/07/2017   Lab Results  Component Value Date   IRON 66 08/17/2017   TIBC 389 08/17/2017   FERRITIN 35 08/17/2017    Imaging Studies: Dg Chest 2 View  Result Date: 11/04/2017 CLINICAL DATA:  Shortness of breath and intermittent productive cough for the past 3 months.  EXAM: CHEST  2 VIEW COMPARISON:  Chest x-ray dated April 17, 2017. FINDINGS: Stable cardiomegaly. Unchanged pulmonary vascular congestion and increased interstitial markings. Prior mitral and tricuspid valve replacement. No focal consolidation, pleural effusion, or pneumothorax. No acute osseous abnormality. IMPRESSION: 1. Stable cardiomegaly, vascular congestion, and interstitial edema. Electronically Signed   By: Titus Dubin M.D.   On: 11/04/2017 15:05   [place] Axillary Node Core Biopsy Left  Addendum Date: 10/20/2017   ADDENDUM REPORT: 10/20/2017 15:49 ADDENDUM: Pathology of the left breast biopsy revealed FIBROCYSTIC CHANGES. NO EVIDENCE OF MALIGNANCY. Pathology of the left axillary lymph node revealed REACTIVE LYMPHOID TISSUE. Microscopic comment: Flow cytometry does not show a monoclonal B cell proliferation or abnormal T cell phenotype. This was found to be discordant by Dr. Jeanmarie Plant. Recommendation: Surgical referral for excision  of both the left breast mass and a left axillary lymph node for additional pathologic evaluation. At the patient's request, results and recommendations were relayed to the patient by phone by Jetta Lout, [place] on 10/14/17 at 6:16 PM. The patient stated she did well following the biopsies. Post biopsy instructions were reviewed with the patient. All of her questions were answered. She requested that the referral be made to [facility]. She was encouraged to contact Jetta Lout, [place] with [facility] with any further questions or concerns. An appointment was made with Dr. Blake Divine for 10/27/17 at 2:30 PM by Jetta Lout, [place]. The patient has been notified of the appointment. Addendum by Jetta Lout, RRA on 10/20/17. Electronically Signed   By: Kristopher Oppenheim M.D.   On: 10/20/2017 15:49   Result Date: 10/20/2017 CLINICAL DATA:  61 year old female with suspicious left breast mass and left axillary lymphadenopathy. EXAM: ULTRASOUND GUIDED LEFT  BREAST CORE NEEDLE BIOPSY AND LEFT AXILLARY CORE NEEDLE BIOPSY COMPARISON:  Previous exam(s). FINDINGS: I met with the patient and we discussed the procedure of ultrasound-guided biopsy, including benefits and alternatives. We discussed the high likelihood of a successful procedure. We discussed the risks of the procedure, including infection, bleeding, tissue injury, clip migration, and inadequate sampling. Informed written consent was given. The usual time-out protocol was performed immediately prior to the procedure. Lesion quadrant: Upper-outer quadrant. Using sterile technique and 1% Lidocaine as local anesthetic, under direct ultrasound visualization, a 12 gauge spring-loaded device was used to perform biopsy of a left breast mass using a lateral approach. At the conclusion of the procedure a ribbon shaped tissue marker clip was deployed into the biopsy cavity. Using sterile technique and 1% Lidocaine as local anesthetic, under direct ultrasound visualization, a 14 gauge spring-loaded device was used to perform biopsy of a left axillary lymph node using a lateral approach. At the conclusion of the procedure a wing shaped tissue marker clip was deployed into the biopsy cavity. Follow up 2 view mammogram was performed and dictated separately. IMPRESSION: Ultrasound guided biopsy of a left breast mass and left axillary lymph node. No apparent complications. Electronically Signed: By: Kristopher Oppenheim M.D. On: 10/11/2017 14:02   Mm Clip Placement Left  Result Date: 10/11/2017 CLINICAL DATA:  62 year old female status post ultrasound-guided biopsy of a left breast mass and left axillary lymph node. EXAM: DIAGNOSTIC LEFT MAMMOGRAM POST ULTRASOUND BIOPSY COMPARISON:  Previous exam(s). FINDINGS: Mammographic images were obtained following ultrasound guided biopsy of a left breast mass and left axillary lymph node. A ribbon shaped clip is identified in the superior central left breast at middle depth. A wing shaped  clip is identified in the large left axillary lymph node. Both clips are in the expected location status post biopsy. IMPRESSION: Two post biopsy tissue marking clips within the left breast and left axilla, both in the expected locations status post ultrasound-guided biopsy. Final Assessment: Post Procedure Mammograms for Marker Placement Electronically Signed   By: Kristopher Oppenheim M.D.   On: 10/11/2017 14:03   [place] Lt Breast Bx W Loc Dev 1st Lesion Img Bx Spec US Guide  Addendum Date: 10/20/2017   ADDENDUM REPORT: 10/20/2017 15:49 ADDENDUM: Pathology of the left breast biopsy revealed FIBROCYSTIC CHANGES. NO EVIDENCE OF MALIGNANCY. Pathology of the left axillary lymph node revealed REACTIVE LYMPHOID TISSUE. Microscopic comment: Flow cytometry does not show a monoclonal B cell proliferation or abnormal T cell phenotype. This was found to be discordant by Dr. Jeanmarie Plant. Recommendation: Surgical referral for  excision of both the left breast mass and a left axillary lymph node for additional pathologic evaluation. At the patient's request, results and recommendations were relayed to the patient by phone by Jetta Lout, [place] on 10/14/17 at 6:16 PM. The patient stated she did well following the biopsies. Post biopsy instructions were reviewed with the patient. All of her questions were answered. She requested that the referral be made to [facility]. She was encouraged to contact Jetta Lout, [place] with [facility] Radiology with any further questions or concerns. An appointment was made with Dr. Blake Divine for 10/27/17 at 2:30 PM by Jetta Lout, [place]. The patient has been notified of the appointment. Addendum by Jetta Lout, RRA on 10/20/17. Electronically Signed   By: Kristopher Oppenheim M.D.   On: 10/20/2017 15:49   Result Date: 10/20/2017 CLINICAL DATA:  62 year old female with suspicious left breast mass and left axillary lymphadenopathy. EXAM: ULTRASOUND GUIDED LEFT BREAST CORE NEEDLE BIOPSY AND  LEFT AXILLARY CORE NEEDLE BIOPSY COMPARISON:  Previous exam(s). FINDINGS: I met with the patient and we discussed the procedure of ultrasound-guided biopsy, including benefits and alternatives. We discussed the high likelihood of a successful procedure. We discussed the risks of the procedure, including infection, bleeding, tissue injury, clip migration, and inadequate sampling. Informed written consent was given. The usual time-out protocol was performed immediately prior to the procedure. Lesion quadrant: Upper-outer quadrant. Using sterile technique and 1% Lidocaine as local anesthetic, under direct ultrasound visualization, a 12 gauge spring-loaded device was used to perform biopsy of a left breast mass using a lateral approach. At the conclusion of the procedure a ribbon shaped tissue marker clip was deployed into the biopsy cavity. Using sterile technique and 1% Lidocaine as local anesthetic, under direct ultrasound visualization, a 14 gauge spring-loaded device was used to perform biopsy of a left axillary lymph node using a lateral approach. At the conclusion of the procedure a wing shaped tissue marker clip was deployed into the biopsy cavity. Follow up 2 view mammogram was performed and dictated separately. IMPRESSION: Ultrasound guided biopsy of a left breast mass and left axillary lymph node. No apparent complications. Electronically Signed: By: Kristopher Oppenheim M.D. On: 10/11/2017 14:02

## 2017-11-13 NOTE — Progress Notes (Signed)
Cardiology Office Note    Date:  11/15/2017   ID:  CAROLYNN TULEY, DOB 1956/03/23, MRN [id number]  PCP:  Caren Macadam, MD  Cardiologist: Dr. Harl Bowie   Chief Complaint  Patient presents with  . Follow-up    Dyspnea on Exertion    History of Present Illness:    Toni [name] is a 62 y.o. female of mitral valve disease (s/p MVR in 2016), PAF (s/p Maze procedure in 2016), chronic combined systolic and diastolic CHF (EF 73-22% by echo in 10/2017), HTN, and HLD who presents to the office today for evaluation of dyspnea on exertion.   She was last examined by Jory Sims, DNP in 06/2017 and denied any recent chest pain but was having wheezing and dyspnea on exertion at that time. PFT's were ordered at that time and she was referred to Pulmonology for further evaluation.   Was evaluated by her PCP on 10/25/2017 and reported worsening dyspnea on exertion. A repeat echocardiogram was obtained and showed a reduced EF of 45% to 50%, diffuse HK, and severe mitral stenosis with a Mean gradient (D) of 11 mm Hg (at 12 mmHg by echo in 08/2016 and 8 mmHg by TEE in 10/2016). She had been referred to Dr. Roxy Manns in 12/2016 who recommended continued medical therapy of her CHF as redo valve repair or replacement would be high-risk and would not provide potential survival benefit.   In talking with the patient today, she reports having dyspnea on exertion for the past several months. She denies any associated chest discomfort, palpitations, lightheadedness, dizziness, or presyncope. Does have baseline 2 pillow orthopnea along with lower extremity edema.  She reports good compliance with her medication regimen including Torsemide and denies missing any recent doses. Weight is variable on her home scales between 230 - 238 lbs. Notices this is elevated when she consumes more potato chips.   She was recently diagnosed with bronchitis and was placed on antibiotic therapy by Pulmonology. A CXR had been obtained on  11/04/2017 and showed stable cardiomegaly with vascular congestion and interstitial pulmonary edema.   Past Medical History:  Diagnosis Date  . Congestive heart failure, unspecified    Diastolic heart failure  . Degeneration of lumbar or lumbosacral intervertebral disc   . Depression   . Diverticulosis   . Dyspnea     chronic multifactorial large component of deconditioning  . Esophageal reflux   . Insomnia    Working third shift  . Mitral stenosis    Secondary to under-sized ring annuloplasty following mitral valve repair  . Mitral valve disorders(424.0)    Mild to moderate mitral regurgitation  . Mitral valve insufficiency and aortic valve insufficiency   . Morbid obesity ([place])   . Normocytic anemia   . Other chest pain   . Other dyspnea and respiratory abnormality   . Paroxysmal atrial fibrillation ([name])    Stable no recurrence  . Peripheral edema    chronic  . S/P Maze operation for atrial fibrillation 02/27/2015  . S/P mitral valve repair 02/27/2015   "radical repair" including resection of P2 segment of posterior leaflet with 26 mm Edwards Physio II annuloplasty ring  . S/P tricuspid valve repair 02/27/2015   26 mm annuloplasty ring  . Unspecified essential hypertension     Past Surgical History:  Procedure Laterality Date  . APPENDECTOMY    . BREAST BIOPSY Left 2009  . CARDIAC VALVE SURGERY  02/27/2015   Mikel Cella. Mitral Valve Repair. Radical Repair with resection of  P2 Prolapse, 26 mm Physio II Ring Annuloplasty, Tricuspid Valve Repair (26 mm Ring Annuloplasty, [facility] Maze IV Procedure on 02/27/2015  . CHOLECYSTECTOMY    . COLONOSCOPY  2011   Dr. Laural Golden: 72mm polyp from hepatic flexure (tubular adenoma)  . COLONOSCOPY N/A 11/10/2016   Dr. Gala Romney: Diverticulosis. Next colonoscopy 7 years, January 2025  . ESOPHAGOGASTRODUODENOSCOPY  2005    Dr. Gala Romney : normal esophagus s/p dilation. Small hiatal hernia noted. Felt improved after dilation.   . ESOPHAGOGASTRODUODENOSCOPY N/A  11/10/2016   Dr. Gala Romney: Moderate-sized hiatal hernia, esophagus normal status post empiric dilation for history of dysphagia  . HEEL SPUR SURGERY Right   . MALONEY DILATION N/A 11/10/2016   Procedure: Venia Minks DILATION;  Surgeon: Daneil Dolin, MD;  Location: [facility];  Service: Endoscopy;  Laterality: N/A;  . MINIMALLY INVASIVE TRICUSPID VALVE REPAIR  02/27/2015   [facility]  . MITRAL VALVE REPAIR  02/27/2015   [facility]  . TEE WITHOUT CARDIOVERSION N/A 10/18/2016   Procedure: TRANSESOPHAGEAL ECHOCARDIOGRAM (TEE);  Surgeon: Arnoldo Lenis, MD;  Location: [facility];  Service: Endoscopy;  Laterality: N/A;  . TOTAL ABDOMINAL HYSTERECTOMY    . TOTAL KNEE ARTHROPLASTY Right   . TUBAL LIGATION      Current Medications: Outpatient Medications Prior to Visit  Medication Sig Dispense Refill  . albuterol (PROVENTIL HFA;VENTOLIN HFA) 108 (90 Base) MCG/ACT inhaler Inhale 2 puffs into the lungs every 6 (six) hours as needed for wheezing or shortness of breath. 1 Inhaler 3  . amLODipine (NORVASC) 5 MG tablet Take 1 tablet (5 mg total) by mouth daily. 90 tablet 3  . cyclobenzaprine (FLEXERIL) 10 MG tablet Take 1 tablet (10 mg total) by mouth 3 (three) times daily as needed for muscle spasms. 30 tablet 0  . doxycycline (VIBRAMYCIN) 100 MG capsule Take 100 mg by mouth 2 (two) times daily.  0  . FLUoxetine (PROZAC) 40 MG capsule Take 1 capsule (40 mg total) by mouth daily. 30 capsule 1  . gabapentin (NEURONTIN) 300 MG capsule Take one tablet in the morning and three tablets in the evening as directed. 270 capsule 1  . indomethacin (INDOCIN) 25 MG capsule 1 po q 8 hour prn gout pain 20 capsule 1  . linaclotide (LINZESS) 72 MCG capsule Take 1 capsule (72 mcg total) by mouth daily before breakfast. 90 capsule 3  . Liniments (SALONPAS PAIN RELIEF PATCH EX) Apply 1 patch topically daily as needed (pain).    Marland Kitchen lisinopril (PRINIVIL,ZESTRIL) 20 MG tablet  Take 1 tablet (20 mg total) by mouth daily. 90 tablet 3  . magnesium gluconate (MAGONATE) 500 MG tablet Take 500 mg by mouth daily.    . metoprolol tartrate (LOPRESSOR) 100 MG tablet Take 1 tablet (100 mg total) by mouth 2 (two) times daily. 180 tablet 1  . omeprazole (PRILOSEC) 40 MG capsule Take 1 capsule (40 mg total) by mouth daily. 90 capsule 1  . torsemide (DEMADEX) 20 MG tablet Take 1 tablet (20 mg total) by mouth daily. 90 tablet 3  . aspirin 325 MG EC tablet Take 325 mg by mouth daily.     No facility-administered medications prior to visit.      Allergies:   Iodinated diagnostic agents   Social History   Socioeconomic History  . Marital status: Single    Spouse name: None  . Number of children: None  . Years of education: None  . Highest education level: None  Social Needs  .  Financial resource strain: None  . Food insecurity - worry: None  . Food insecurity - inability: None  . Transportation needs - medical: None  . Transportation needs - non-medical: None  Occupational History  . None  Tobacco Use  . Smoking status: Former Smoker    Packs/day: 0.50    Years: 25.00    Pack years: 12.50    Types: Cigarettes    Last attempt to quit: 1997    Years since quitting: 22.0  . Smokeless tobacco: Former Systems developer    Types: Chew    Quit date: 27  . Tobacco comment: dipped tobacco during pregancy in 1975  Substance and Sexual Activity  . Alcohol use: No  . Drug use: No  . Sexual activity: Yes    Partners: Male  Other Topics Concern  . None  Social History Narrative   Lives alone or son will stay with her at times. Cooks. Eats well.      Family History:  The patient's family history includes Cancer in her paternal grandmother; Diabetes in her sister; Heart attack in her son; Heart disease in her brother, brother, mother, and sister; Hypertension in her mother, sister, and son; Kidney disease in her father and sister; Stroke in her father.   Review of Systems:     Please see the history of present illness.     General:  No chills, fever, night sweats or weight changes.  Cardiovascular:  No chest pain, edema, palpitations, paroxysmal nocturnal dyspnea. Positive for dyspnea on exertion and orthopnea.  Dermatological: No rash, lesions/masses Respiratory: No cough, dyspnea Urologic: No hematuria, dysuria Abdominal:   No nausea, vomiting, diarrhea, bright red blood per rectum, melena, or hematemesis Neurologic:  No visual changes, wkns, changes in mental status. All other systems reviewed and are otherwise negative except as noted above.   Physical Exam:    VS:  BP 126/76   Pulse 60   Ht 5\' 7"  (1.702 m)   Wt 232 lb (105.2 kg)   LMP  (LMP Unknown)   SpO2 90%   BMI 36.34 kg/m    General: Well developed, well nourished [place] American female appearing in no acute distress. Head: Normocephalic, atraumatic, sclera non-icteric, no xanthomas, nares are without discharge.  Neck: No carotid bruits. JVD not elevated.  Lungs: Respirations regular and unlabored, mild rales along right base. Heart: Regular rate and rhythm. No S3 or S4.  No rubs or gallops appreciated. 2/6 SEM along Apex.  Abdomen: Soft, non-tender, non-distended with normoactive bowel sounds. No hepatomegaly. No rebound/guarding. No obvious abdominal masses. Msk:  Strength and tone appear normal for age. No joint deformities or effusions. Extremities: No clubbing or cyanosis. No edema.  Distal pedal pulses are 2+ bilaterally. Neuro: Alert and oriented X 3. Moves all extremities spontaneously. No focal deficits noted. Psych:  Responds to questions appropriately with a normal affect. Skin: No rashes or lesions noted  Wt Readings from Last 3 Encounters:  11/15/17 232 lb (105.2 kg)  11/10/17 239 lb 3.2 oz (108.5 kg)  10/27/17 234 lb (106.1 kg)     Studies/Labs Reviewed:   EKG:  EKG is not ordered today.   Recent Labs: 04/17/2017: ALT 37; B Natriuretic Peptide 759.0; Magnesium  2.0 08/17/2017: BUN 29; Creat 1.10; Potassium 4.0; Sodium 139 09/07/2017: Hemoglobin 11.0; Platelets 153   Lipid Panel    Component Value Date/Time   CHOL 119 09/16/2017 1148   TRIG 70 09/16/2017 1148   HDL 37 (L) 09/16/2017 1148   CHOLHDL 3.2  09/16/2017 1148   VLDL 14 08/31/2016 1003   LDLCALC 84 08/31/2016 1003    Additional studies/ records that were reviewed today include:   Echocardiogram: 11/04/2017 Study Conclusions  - Left ventricle: The cavity size was normal. Wall thickness was   increased in a pattern of mild LVH. Systolic function was mildly   reduced. The estimated ejection fraction was in the range of 45%   to 50%. Diffuse hypokinesis. The study is not technically   sufficient to allow evaluation of LV diastolic function. - Aortic valve: Mildly calcified annulus. Trileaflet. Mean gradient   (S): 3 mm Hg. Valve area (VTI): 2.21 cm^2. - Mitral valve: The findings are consistent with severe stenosis.   There was trivial regurgitation. Mean gradient (D): 11 mm Hg.   Planimetered valve area: 0.9 cm^2. Valve area by continuity   equation (using LVOT flow): 0.68 cm^2. - Left atrium: The atrium was severely dilated. - Right ventricle: The cavity size was mildly dilated. Systolic   function was moderately reduced. - Right atrium: The atrium was mildly dilated. Central venous   pressure (est): 15 mm Hg. - Atrial septum: No defect or patent foramen ovale was identified. - Tricuspid valve: There was mild regurgitation. - Pulmonary arteries: PA peak pressure: 58 mm Hg (S). - Pericardium, extracardiac: There was no pericardial effusion.  Impressions:  - Mild LVH with LVEF approximately 45-50%. Indeterminate diastolic   function. Severe left atrial enlargement. Findings consistent   with severe mitral stenosis as detailed above. Moderately reduced   right ventricular contraction. Mild tricuspid regurgitation with   elevated PASP estimated 58 mmHg.   Assessment:     1. Mitral valve stenosis, unspecified etiology   2. H/O mitral valve repair   3. Chronic combined systolic and diastolic CHF (congestive heart failure) ([name])   4. Paroxysmal atrial fibrillation ([place])   5. Essential hypertension      Plan:   In order of problems listed above:  1. Mitral valve disease - s/p MVR in 2016. Most recent echocardiogram in 10/2017 showed a reduced EF of 45% to 50%, diffuse HK, and severe mitral stenosis with a mean gradient (D) of 11 mm Hg (at 12 mmHg by echo in 08/2016 and 8 mmHg by TEE in 10/2016). She had been referred to Dr. Roxy Manns in 12/2016 who recommended continued medical therapy of her CHF as redo valve repair or replacement would be high-risk and would not provide potential survival benefit.  - she does report progressive dyspnea on exertion over the past several months which is likely multifactorial in the setting of her mitral stenosis, CHF, and recently treated bronchitis.  - we reviewed her most recent echo in detail and dicussed continued medical management vs. referral back to CT Surgery for further discussion regarding valve replacement. She wishes to continue with medical management at this time which certainly seems reasonable given her similar valve gradients and the risk of repeat surgery as was previously discussed between the patient and Dr. Roxy Manns.    2. Chronic Combined Systolic and Diastolic CHF - recent echo showed her EF was mildly reduced at 45-50%. She does have mild rales along her right base on examination and recent CXR showed interstitial edema. Will ask her to take an extra Torsemide tablet for the next two days then resume her baseline dosing of 20mg  daily with instructions to take an extra tablet for weight gain > 3 lbs overnight or > 5 lbs in one week.   - continue BB and ACE-I  therapy in the setting of her cardiomyopathy.  - we reviewed the importance of sodium and fluid   3.  PAF - s/p Maze procedure in 2016. She denies any  recent palpitations and is maintaining NSR by examination today.  - no longer on anticoagulation. Currently takes 325 mg ASA. Will reduce to 81mg  daily.   4. HTN - BP is well-controlled at 126/76 during today's visit. - continue current medication regimen.    Medication Adjustments/Labs and Tests Ordered: Current medicines are reviewed at length with the patient today.  Concerns regarding medicines are outlined above.  Medication changes, Labs and Tests ordered today are listed in the Patient Instructions below. Patient Instructions  Medication Instructions:  Your physician has recommended you make the following change in your medication:  Decrease Aspirin to 81 mg Daily  Take an Extra Torsemide Today and Tomorrow, Then return to Daily. You May take an extra Torsemide on days you have a weight gain of more than 3 lbs over night.   Labwork: NONE   Testing/Procedures: NONE   Follow-Up: Your physician recommends that you schedule a follow-up appointment in: 2 Months with Dr. Harl Bowie.   Any Other Special Instructions Will Be Listed Below (If Applicable).  If you need a refill on your cardiac medications before your next appointment, please call your pharmacy. Thank you for choosing [facility]!     Signed, Erma Heritage, PA-C  11/15/2017 4:47 PM    [facility] [address] [place], [place] [address] Phone: [phone]

## 2017-11-15 ENCOUNTER — Ambulatory Visit (INDEPENDENT_AMBULATORY_CARE_PROVIDER_SITE_OTHER): Payer: Medicaid Other | Admitting: Student

## 2017-11-15 ENCOUNTER — Encounter: Payer: Self-pay | Admitting: Student

## 2017-11-15 VITALS — BP 126/76 | HR 60 | Ht 67.0 in | Wt 232.0 lb

## 2017-11-15 DIAGNOSIS — I050 Rheumatic mitral stenosis: Secondary | ICD-10-CM

## 2017-11-15 DIAGNOSIS — I5042 Chronic combined systolic (congestive) and diastolic (congestive) heart failure: Secondary | ICD-10-CM | POA: Diagnosis not present

## 2017-11-15 DIAGNOSIS — Z9889 Other specified postprocedural states: Secondary | ICD-10-CM

## 2017-11-15 DIAGNOSIS — I480 Paroxysmal atrial fibrillation: Secondary | ICD-10-CM | POA: Diagnosis not present

## 2017-11-15 DIAGNOSIS — I10 Essential (primary) hypertension: Secondary | ICD-10-CM | POA: Diagnosis not present

## 2017-11-15 MED ORDER — ASPIRIN EC 81 MG PO TBEC: 81.0000 mg | DELAYED_RELEASE_TABLET | Freq: Every day | ORAL | 3 refills | Status: DC

## 2017-11-15 NOTE — Patient Instructions (Signed)
Medication Instructions:  Your physician has recommended you make the following change in your medication:  Decrease Aspirin to 81 mg Daily  Take an Extra Torsemide Today and Tomorrow, Then return to Daily. You May take an extra Torsemide on days you have a weight gain of more than 3 lbs over night.    Labwork: NONE   Testing/Procedures: NONE   Follow-Up: Your physician recommends that you schedule a follow-up appointment in: 2 Months with Dr. Harl Bowie.    Any Other Special Instructions Will Be Listed Below (If Applicable).     If you need a refill on your cardiac medications before your next appointment, please call your pharmacy. Thank you for choosing [facility]!

## 2017-11-24 ENCOUNTER — Ambulatory Visit: Payer: Medicaid Other | Admitting: Internal Medicine

## 2017-11-24 ENCOUNTER — Encounter: Payer: Self-pay | Admitting: Internal Medicine

## 2017-11-24 VITALS — BP 114/80 | HR 54 | Ht 67.0 in | Wt 235.0 lb

## 2017-11-24 DIAGNOSIS — R0600 Dyspnea, unspecified: Secondary | ICD-10-CM

## 2017-11-24 DIAGNOSIS — R0609 Other forms of dyspnea: Secondary | ICD-10-CM

## 2017-11-24 DIAGNOSIS — I10 Essential (primary) hypertension: Secondary | ICD-10-CM | POA: Diagnosis not present

## 2017-11-24 DIAGNOSIS — R058 Other specified cough: Secondary | ICD-10-CM

## 2017-11-24 DIAGNOSIS — R05 Cough: Secondary | ICD-10-CM | POA: Diagnosis not present

## 2017-11-24 MED ORDER — IRBESARTAN 150 MG PO TABS: 150.0000 mg | ORAL_TABLET | Freq: Every day | ORAL | 11 refills | Status: DC

## 2017-11-24 NOTE — Progress Notes (Signed)
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   11/24/2017 1st [facility] office visit/ Toni [name]   Chief Complaint  Patient presents with  . Pulmonary Consult    Referred by Dr Caren Macadam. Pt c/o cough since Dec 2018. She is coughing up light green to white sputum.  She has noticed smoke and dust are things that can trigger the cough. She states she has SOB with doing light housework. She has to sleep propped up on 2-3 pillows or she has trouble breathing.   She is using her albuterol inhaler 2 x per day on average.   indolent onset over a year prior to OV  Urge to clear the throat assoc with dysphagia and intermittently coughing up min amt of green mucus some better on abx but never gone with sensation of something stuck in throat she can't cough up  Already on omeprazole 40 mg ac  Qd  Can't sleep on back to sob and back pain  Some better p saba    No obvious day to day or daytime variability or assoc   mucus plugs or hemoptysis or cp or chest tightness, subjective wheeze or overt sinus or hb symptoms. No unusual exposure hx or h/o childhood pna/ asthma or knowledge of premature birth.  Sleeping ok on 2 pillows  without nocturnal  or early am exacerbation  of respiratory  c/o's or need for noct saba. Also denies any obvious fluctuation of symptoms with weather or environmental changes or other aggravating or alleviating factors except as outlined above   Current Allergies, Complete Past Medical History, Past Surgical History, Family History, and Social History were reviewed in [employer] record.  ROS  The following are not active complaints unless bolded Hoarseness, sore throat, dysphagia,  dental problems, itching, sneezing,  nasal congestion or discharge of excess mucus or purulent secretions, ear ache,   fever, chills, sweats, unintended wt loss or wt gain, classically pleuritic or exertional cp,  orthopnea pnd or leg swelling, presyncope, palpitations, abdominal pain, anorexia, nausea, vomiting, diarrhea  or change in bowel habits or change in bladder habits, change in stools or change in urine, dysuria, hematuria,  rash, arthralgias, visual complaints, headache, numbness, weakness or ataxia or problems with walking or coordination,  change in mood/affect or memory.        Current Meds  Medication Sig  . albuterol (PROVENTIL HFA;VENTOLIN HFA) 108 (90 Base) MCG/ACT inhaler Inhale 2 puffs into the lungs every 6 (six) hours as needed for wheezing or shortness of breath.  Marland Kitchen amLODipine (NORVASC) 5 MG tablet Take 1 tablet (5 mg total) by mouth daily.  Marland Kitchen aspirin EC 81 MG tablet Take 1 tablet (81 mg total) by mouth daily.  . cyclobenzaprine (FLEXERIL) 10 MG tablet Take 1 tablet (10 mg total) by mouth 3 (three) times daily as needed for muscle spasms.  Marland Kitchen FLUoxetine (PROZAC) 40 MG capsule Take 1 capsule (40 mg total) by mouth daily.  Marland Kitchen gabapentin (NEURONTIN) 300 MG capsule Take one tablet in the morning and three tablets in the evening as directed.  . indomethacin (INDOCIN) 25 MG capsule 1 po q 8 hour prn gout pain  .  linaclotide (LINZESS) 72 MCG capsule Take 1 capsule (72 mcg total) by mouth daily before breakfast.  . Liniments (SALONPAS PAIN RELIEF PATCH EX) Apply 1 patch topically daily as needed (pain).  . magnesium gluconate (MAGONATE) 500 MG tablet Take 500 mg by mouth daily.  . metoprolol tartrate (LOPRESSOR) 100 MG tablet Take 1 tablet (100 mg total) by mouth 2 (two) times daily.  Marland Kitchen omeprazole (PRILOSEC) 40 MG capsule Take 1 capsule (40 mg total) by mouth daily.  Marland Kitchen torsemide (DEMADEX) 20 MG tablet Take 1 tablet (20 mg total) by mouth daily.  . [ lisinopril (PRINIVIL,ZESTRIL) 20 MG  tablet Take 1 tablet (20 mg total) by mouth daily.       Review of Systems     Objective:   Physical Exam    amb bf nad   Wt Readings from Last 3 Encounters:  11/24/17 235 lb (106.6 kg)  11/15/17 232 lb (105.2 kg)  11/10/17 239 lb 3.2 oz (108.5 kg)     Vital signs reviewed - Note on arrival 02 sats  93% on RA     HEENT: nl dentition, turbinates bilaterally, and oropharynx. Nl external ear canals without cough reflex   NECK :  without JVD/Nodes/TM/ nl carotid upstrokes bilaterally   LUNGS: no acc muscle use,  Nl contour chest which is clear to A and P bilaterally without cough on insp or exp maneuvers   CV:  RRR  no s3 or murmur or increase in P2, and  Trace bilalteral ankle pitting  ABD: obese soft and nontender with  Very limited  inspiratory excursion in the supine position. No bruits or organomegaly appreciated, bowel sounds nl  MS:  Nl gait/ ext warm without deformities, calf tenderness, cyanosis or clubbing No obvious joint restrictions   SKIN: warm and dry without lesions    NEURO:  alert, approp, nl sensorium with  no motor or cerebellar deficits apparent.     I personally reviewed images and agree with radiology impression as follows:  CXR:   11/04/17 1. Stable cardiomegaly, vascular congestion, and interstitial edema.     Assessment:

## 2017-11-24 NOTE — Assessment & Plan Note (Signed)
In the best review of chronic cough to date ( NEJM 2016 375 [phone]) ,  ACEi are now felt to cause cough in up to  20% of pts which is a 4 fold increase from previous reports and does not include the variety of non-specific complaints we see in pulmonary clinic in pts on ACEi but previously attributed to another dx like  Copd/asthma and  include PNDS, throat and chest congestion, "bronchitis", unexplained dyspnea and noct "strangling" sensations, and hoarseness, but also  atypical /refractory GERD symptoms like dysphagia and "bad heartburn"   The only way I know  to prove this is not an "ACEi Case" is a trial off ACEi x a minimum of 6 weeks then regroup.   Try avarpor 150 mg daily

## 2017-11-24 NOTE — Patient Instructions (Addendum)
Stop lisinopril and your cough problem should gradually resolve over several weeks  Start ibesartan 150 mg one daily   Continue omeprazole 40 mg Take 30-60 min before first meal of the day and add pepcid 20 mg at bedtime until better then stop  GERD (REFLUX)  is an extremely common cause of respiratory symptoms just like yours , many times with no obvious heartburn at all.    It can be treated with medication, but also with lifestyle changes including elevation of the head of your bed (ideally with 6 inch  bed blocks),  Smoking cessation, avoidance of late meals, excessive alcohol, and avoid fatty foods, chocolate, peppermint, colas, red wine, and acidic juices such as orange juice.  NO MINT OR MENTHOL PRODUCTS SO NO COUGH DROPS  USE SUGARLESS CANDY INSTEAD (Jolley ranchers or Stover's or Life Savers) or even ice chips will also do - the key is to swallow to prevent all throat clearing. NO OIL BASED VITAMINS - use powdered substitutes.     If you are satisfied with your treatment plan,  let your doctor know and he/she can either refill your medications or you can return here when your prescription runs out.     If in any way you are not 100% satisfied,  please tell us.  If 100% better, tell your friends!  Pulmonary follow up is as needed

## 2017-11-24 NOTE — Assessment & Plan Note (Signed)
Upper airway cough syndrome (previously labeled PNDS),  is so named because it's frequently impossible to sort out how much is  CR/sinusitis with freq throat clearing (which can be related to primary GERD)   vs  causing  secondary (" extra esophageal")  GERD from wide swings in gastric pressure that occur with throat clearing, often  promoting self use of mint and menthol lozenges that reduce the lower esophageal sphincter tone and exacerbate the problem further in a cyclical fashion.   These are the same pts (now being labeled as having "irritable larynx syndrome" by some cough centers) who not infrequently have a history of having failed to tolerate ace inhibitors,  dry powder inhalers or biphosphonates or report having atypical/extraesophageal reflux symptoms that don't respond to standard doses of PPI  and are easily confused as having aecopd or asthma flares by even experienced allergists/ pulmonologists (myself included).   First try off acei - on gerd rx >  then return if not better

## 2017-11-24 NOTE — Assessment & Plan Note (Addendum)
PFT's  06/21/17    FEV1 1.95 (85 % ) ratio 89   p no % improvement from saba p ?  prior to study with DLCO  45  % corrects to 72 % for alv volume  Echo 07/05/17 Mild LVH with LVEF approximately 45-50%. Indeterminate diastolic   function. Severe left atrial enlargement. Findings consistent   with severe mitral stenosis as detailed above. Moderately reduced   right ventricular contraction. Mild tricuspid regurgitation with   elevated PASP estimated 58 mmHg.  - trial off acei 11/24/2017 >>>    DDX of  difficult airways management almost all start with A and  include Adherence, Ace Inhibitors, Acid Reflux, Active Sinus Disease, Alpha 1 Antitripsin deficiency, Anxiety masquerading as Airways dz,  ABPA,  Allergy(esp in young), Aspiration (esp in elderly), Adverse effects of meds,  Active smokers, A bunch of PE's (a small clot burden can't cause this syndrome unless there is already severe underlying pulm or vascular dz with poor reserve) plus two Bs  = Bronchiectasis and Beta blocker use..and one C= CHF   Adherence is always the initial "prime suspect" and is a multilayered concern that requires a "trust but verify" approach in every patient - starting with knowing how to use medications, especially inhalers, correctly, keeping up with refills and understanding the fundamental difference between maintenance and prns vs those medications only taken for a very short course and then stopped and not refilled.   ACEi at top of usual list of suspects > see hbp  ? Acid (or non-acid) GERD > always difficult to exclude as up to 75% of pts in some series report no assoc GI/ Heartburn symptoms> rec continue max (24h)  acid suppression and diet restrictions/ reviewed     ? Allergy /asthma > nothing to suggest any h/o atopy  ? Active sinus Dz > consider sinus ct next   CHF >  ? Cardiac asthma from mitral stenosis/ chf > cards f/u planned   rec return to clinic in 4-6 weeks if not much better with change off acei  and rx of gerd   Total time devoted to counseling  > 50 % of initial 60 min office visit:  review case with pt/ discussion of options/alternatives/ personally creating written customized instructions  in presence of pt  then going over those specific  Instructions directly with the pt including how to use all of the meds but in particular covering each new medication in detail and the difference between the maintenance= "automatic" meds and the prns using an action plan format for the latter (If this problem/symptom => do that organization reading Left to right).  Please see AVS from this visit for a full list of these instructions which I personally wrote for this pt and  are unique to this visit.

## 2017-11-30 ENCOUNTER — Telehealth: Payer: Self-pay | Admitting: Internal Medicine

## 2017-11-30 ENCOUNTER — Telehealth: Payer: Self-pay | Admitting: General Surgery

## 2017-11-30 MED ORDER — LOSARTAN POTASSIUM 100 MG PO TABS: 100.0000 mg | ORAL_TABLET | Freq: Every day | ORAL | 11 refills | Status: AC

## 2017-11-30 NOTE — Telephone Encounter (Signed)
Try losartan 100 mg daily then return w/in  A week of change for recheck bp by one of our  NP's

## 2017-11-30 NOTE — Telephone Encounter (Signed)
Called pt letting her know about pharmacy requiring a PA and per the PA, pt needed to try a different med before they would cover the current BP med prescribed per MW.  Told pt MW was switching her to losartan and needed to come in 1 week to have BP checked.   Pt expressed understanding. Rx sent to pt's preferred pharmacy and appt made with TP 12/07/17. Nothing further needed at this current time.

## 2017-11-30 NOTE — Telephone Encounter (Signed)
-----   Message from Erma Heritage, [place] sent at 11/29/2017  5:00 PM EST ----- Hi Dr. Constance Haw,   We usually receive a clearance form and I did not receive one, but with that being said it could have gone to Dr. Harl Bowie. She has the known mitral valve stenosis but that is unchanged from her prior study and her volume status was good at the time of her evaluation.   With a repeat echocardiogram just being obtained and no history of CAD, she would not require further testing prior to surgery and is of acceptable risk to proceed from a cardiac perspective. If she is going to be admitted post-operatively, would recommended telemetry monitoring with her history of atrial fibrillation. She is no longer on anticoagulation and can hold ASA 5 days prior to the procedure if needed.   If you need further documentation, please let me know.   Best,  [place]   ----- Message ----- From: [name], MD Sent: 11/29/2017   1:24 PM To: Erma Heritage, PA-C  Ms. Ahmed Prima,   You have recently seen Ms. Nevils regarding her recent ECHO, and we had intended for you all to do risk stratification for her leading up to surgery at the same time. I am not sure if that was adequately communicated. She has left axillary adenopathy and a breast biopsy that is discordonant, and she is going to need to have a partial mastectomy with axillary node dissection at the time of her surgery for suspicion of left breast cancer.    Is there anyway for you to comment regarding her risk or do you want her to return to see you all again. We would like to get her scheduled in the upcoming weeks.  I have attached my note.   Thank You, Curlene Labrum, MD [facility] [address] [place], [place] [address] [phone] (office)

## 2017-11-30 NOTE — Telephone Encounter (Signed)
I called [facility] to get more information and the Avapro needs a PA. I called [employer] at [phone] PA #[phone]  PA call number [phone]  Pt has to try valsartan and Losartan first before they will pay for Avapro. MW would you like to change pt's medication to the preferred alternatives?   OV from 11/24/2017  Stop lisinopril and your cough problem should gradually resolve over several weeks  Start ibesartan 150 mg one daily   Continue omeprazole 40 mg Take 30-60 min before first meal of the day and add pepcid 20 mg at bedtime until better then stop  GERD (REFLUX)  is an extremely common cause of respiratory symptoms just like yours , many times with no obvious heartburn at all.    It can be treated with medication, but also with lifestyle changes including elevation of the head of your bed (ideally with 6 inch  bed blocks),  Smoking cessation, avoidance of late meals, excessive alcohol, and avoid fatty foods, chocolate, peppermint, colas, red wine, and acidic juices such as orange juice.  NO MINT OR MENTHOL PRODUCTS SO NO COUGH DROPS  USE SUGARLESS CANDY INSTEAD (Jolley ranchers or Stover's or Life Savers) or even ice chips will also do - the key is to swallow to prevent all throat clearing. NO OIL BASED VITAMINS - use powdered substitutes.

## 2017-12-07 ENCOUNTER — Encounter: Payer: Self-pay | Admitting: Adult Health

## 2017-12-07 ENCOUNTER — Ambulatory Visit: Payer: Medicaid Other | Admitting: Adult Health

## 2017-12-07 DIAGNOSIS — R05 Cough: Secondary | ICD-10-CM | POA: Diagnosis not present

## 2017-12-07 DIAGNOSIS — R0609 Other forms of dyspnea: Secondary | ICD-10-CM | POA: Diagnosis not present

## 2017-12-07 DIAGNOSIS — R058 Other specified cough: Secondary | ICD-10-CM

## 2017-12-07 DIAGNOSIS — R0600 Dyspnea, unspecified: Secondary | ICD-10-CM

## 2017-12-07 DIAGNOSIS — I10 Essential (primary) hypertension: Secondary | ICD-10-CM

## 2017-12-07 NOTE — Progress Notes (Signed)
Chart and office note reviewed in detail  > agree with a/p as outlined    

## 2017-12-07 NOTE — Assessment & Plan Note (Signed)
Suspect dyspnea is multifactorial with underlying congestive heart failure, valvular disease and pulmonary hypertension.  We will continue management under cardiology.  On return can discuss if any possible sleep apnea symptoms are present if so can proceed with a sleep study as patient has underl pulmonary hypertension. Pulmonary function test does not show any COPD.  Doubt has underlying asthma.  Previous CT chest although was not a high resolution CT did not show any concerning areas for possible interstitial lung disease. Patient is return in 6-8 weeks for further evaluation

## 2017-12-07 NOTE — Assessment & Plan Note (Signed)
ACE inhibitor was held due to cough.  Patient blood pressure is adequate on losartan.  Patient does have some nausea associated with losartan.  She can switch this to dinnertime with a meal to see if this alleviates this.  She is to follow with her primary care physician next month for further blood pressure management.  Going forward would avoid ACE inhibitors if possible due to ongoing cough.

## 2017-12-07 NOTE — Assessment & Plan Note (Signed)
Chronic cough on ACE inhibitor suspect cough is multifactorial with possible postnasal drainage, GERD, ACE inhibitor.  Cough is slowly improving off of ACE inhibitor. Would add in Delsym and Zyrtec for cough control and trigger prevention.  Plan  Patient Instructions  Continue on Losartan 100mg  daily , may take at dinner time with food.  Follow up with Primary MD for blood pressure next month as planned.  Would avoid ACE Iinhibitors , like Lisinopril if possible as may make cough worse.  Delsym 2 tsp every 12hr as needed .  May try use Zyrtec 10mg  At bedtime  As needed  Drainage .  Follow up with Dr. Melvyn Novas  In 6-8 weeks and As needed   Please contact office for sooner follow up if symptoms do not improve or worsen or seek emergency care

## 2017-12-07 NOTE — Progress Notes (Signed)
@Patient  ID: Toni [name], female    DOB: 1956-10-22, 62 y.o.   MRN: [id number]  Chief Complaint  Patient presents with  . Follow-up    Cough     Referring provider: Caren Macadam, MD  HPI: 62 year old female former smoker seen for pulmonary consult November 24, 2017 for cough and shortness of breath Past medical history is significant for severe mitral stenosis status post Tricupsid /Mitral  valve replacement  at [facility] 2016, PAF status post Maze procedure 5176, combined systolic and diastolic heart failure (EF 45-50%), pulmonary hypertension.  (PAP 58 mmHg)  TEST  echocardiogram 10/2017 >reduced EF of 45%to 50%, diffuse HK, and severe mitral stenosis , left atrium severely dilated, right ventricle mildly dilated, right atrium mildly dilated, pulmonary artery pressure 58 mmHg (seen by CTS 12/2016 deemed medical therapy tx , as not surgical candidate for re-do vlave .   PFT's  06/21/17    FEV1 1.95 (85 % ) ratio 89   p no % improvement from saba p ?  prior to study with DLCO  45  % corrects to 72 % for alv volume   Chest x-ray December 2018 showed stable cardiomegaly vascular congestion and interstitial edema   12/07/2017 Follow up : Cough , dyspnea , HTN  Patient presents for a 2-week follow-up.  Patient was seen last visit for a pulmonary consult for cough and shortness of breath.  Patient was found to have an upper airway cough possibly secondary to GERD and ACE inhibitor.  Her lisinopril was stopped.  She was changed over to losartan.  She was instructed to take Prilosec and Pepcid to cover reflux.  Patient says that her cough is decreased.  Especially her nighttime cough.. Patient says that the losartan seems to cause some nausea.  We discussed taking it with food and change it to a evening dose.  Patient has had progressive shortness of breath over the last year.  She has known congestive heart failure is followed by cardiology.  Recent echo in December showed a decreased  ejection fraction at 45-50%, diffuse hypokinesis and severe mitral stenosis.  And severely dilated left atrium.  Patient has had a previous mitral valve and tricuspid valve replacement.  She has been referred to cardiovascular surgery last year and deemed not a surgical candidate for redo of her valve replacement according to cardiology notes reviewed.  Echo also showed pulmonary hypertension with pulmonary pressures at 58 mmHg. Patient is a previous smoker.  PFTs did not show COPD.   Patient had normal lung function with no airflow obstruction. CT chest reviewed from October 2018 showed a small left upper lobe granuloma.  And patchy atelectasis in both bases otherwise lungs were on remarkable. She does have 2 pillow orthopnea and chronic LE edema.     Allergies  Allergen Reactions  . Iodinated Diagnostic Agents Swelling    EYES    Immunization History  Administered Date(s) Administered  . Influenza,inj,Quad PF,6+ Mos 08/09/2016, 08/17/2017  . Pneumococcal Conjugate-13 09/16/2017    Past Medical History:  Diagnosis Date  . Congestive heart failure, unspecified    Diastolic heart failure  . Degeneration of lumbar or lumbosacral intervertebral disc   . Depression   . Diverticulosis   . Dyspnea     chronic multifactorial large component of deconditioning  . Esophageal reflux   . Insomnia    Working third shift  . Mitral stenosis    Secondary to under-sized ring annuloplasty following mitral valve repair  .  Mitral valve disorders(424.0)    Mild to moderate mitral regurgitation  . Mitral valve insufficiency and aortic valve insufficiency   . Morbid obesity ([place])   . Normocytic anemia   . Other chest pain   . Other dyspnea and respiratory abnormality   . Paroxysmal atrial fibrillation ([name])    Stable no recurrence  . Peripheral edema    chronic  . S/P Maze operation for atrial fibrillation 02/27/2015  . S/P mitral valve repair 02/27/2015   "radical repair" including resection of  P2 segment of posterior leaflet with 26 mm Edwards Physio II annuloplasty ring  . S/P tricuspid valve repair 02/27/2015   26 mm annuloplasty ring  . Unspecified essential hypertension     Tobacco History: Social History   Tobacco Use  Smoking Status Former Smoker  . Packs/day: 0.50  . Years: 25.00  . Pack years: 12.50  . Types: Cigarettes  . Last attempt to quit: 1997  . Years since quitting: 22.0  Smokeless Tobacco Former Systems developer  . Types: Chew  . Quit date: 1975  Tobacco Comment   dipped tobacco during pregancy in 1975   Counseling given: Not Answered Comment: dipped tobacco during pregancy in 1975   Outpatient Encounter Medications as of 12/07/2017  Medication Sig  . albuterol (PROVENTIL HFA;VENTOLIN HFA) 108 (90 Base) MCG/ACT inhaler Inhale 2 puffs into the lungs every 6 (six) hours as needed for wheezing or shortness of breath.  Marland Kitchen amLODipine (NORVASC) 5 MG tablet Take 1 tablet (5 mg total) by mouth daily.  Marland Kitchen aspirin EC 81 MG tablet Take 1 tablet (81 mg total) by mouth daily.  . cyclobenzaprine (FLEXERIL) 10 MG tablet Take 1 tablet (10 mg total) by mouth 3 (three) times daily as needed for muscle spasms.  Marland Kitchen FLUoxetine (PROZAC) 40 MG capsule Take 1 capsule (40 mg total) by mouth daily.  Marland Kitchen gabapentin (NEURONTIN) 300 MG capsule Take one tablet in the morning and three tablets in the evening as directed.  . indomethacin (INDOCIN) 25 MG capsule 1 po q 8 hour prn gout pain  . linaclotide (LINZESS) 72 MCG capsule Take 1 capsule (72 mcg total) by mouth daily before breakfast.  . Liniments (SALONPAS PAIN RELIEF PATCH EX) Apply 1 patch topically daily as needed (pain).  Marland Kitchen losartan (COZAAR) 100 MG tablet Take 1 tablet (100 mg total) by mouth daily.  . magnesium gluconate (MAGONATE) 500 MG tablet Take 500 mg by mouth daily.  . metoprolol tartrate (LOPRESSOR) 100 MG tablet Take 1 tablet (100 mg total) by mouth 2 (two) times daily.  Marland Kitchen omeprazole (PRILOSEC) 40 MG capsule Take 1 capsule (40  mg total) by mouth daily.  Marland Kitchen torsemide (DEMADEX) 20 MG tablet Take 1 tablet (20 mg total) by mouth daily.   No facility-administered encounter medications on file as of 12/07/2017.      Review of Systems  Constitutional:   No  weight loss, night sweats,  Fevers, chills, + fatigue, or  lassitude.  HEENT:   No headaches,  Difficulty swallowing,  Tooth/dental problems, or  Sore throat,                No sneezing, itching, ear ache, + nasal congestion, post nasal drip,   CV:  No chest pain,  Orthopnea, PND, swelling in lower extremities, anasarca, dizziness, palpitations, syncope.   GI  No heartburn, indigestion, abdominal pain, nausea, vomiting, diarrhea, change in bowel habits, loss of appetite, bloody stools.   Resp: No shortness of breath with exertion or at  rest.  No excess mucus, no productive cough,  No non-productive cough,  No coughing up of blood.  No change in color of mucus.  No wheezing.  No chest wall deformity  Skin: no rash or lesions.  GU: no dysuria, change in color of urine, no urgency or frequency.  No flank pain, no hematuria   MS:  No joint pain or swelling.  No decreased range of motion.  No back pain.    Physical Exam  BP 140/72 (BP Location: Right Arm, Cuff Size: Normal)   Pulse (!) 54   Ht 5\' 7"  (1.702 m)   Wt 234 lb 3.2 oz (106.2 kg)   LMP  (LMP Unknown)   SpO2 95%   BMI 36.68 kg/m   GEN: A/Ox3; pleasant , NAD, obese    HEENT:  [place]/AT,  EACs-clear, TMs-wnl, NOSE-clear, THROAT-clear, no lesions, no postnasal drip or exudate noted.   NECK:  Supple w/ fair ROM; no JVD; normal carotid impulses w/o bruits; no thyromegaly or nodules palpated; no lymphadenopathy.    RESP  Clear  P & A; w/o, wheezes/ rales/ or rhonchi. no accessory muscle use, no dullness to percussion  CARD:  RRR, no m/r/g, tr -1 +  peripheral edema, pulses intact, no cyanosis or clubbing.  GI:   Soft & nt; nml bowel sounds; no organomegaly or masses detected.   Musco: Warm bil, no  deformities or joint swelling noted.   Neuro: alert, no focal deficits noted.    Skin: Warm, no lesions or rashes    Lab Results:  CBC    Component Value Date/Time   WBC 4.6 09/07/2017 0903   RBC 3.66 (L) 09/07/2017 0903   HGB 11.0 (L) 09/07/2017 0903   HCT 33.5 (L) 09/07/2017 0903   PLT 153 09/07/2017 0903   MCV 91.5 09/07/2017 0903   MCH 30.1 09/07/2017 0903   MCHC 32.8 09/07/2017 0903   RDW 12.9 09/07/2017 0903   LYMPHSABS 1,168 09/07/2017 0903   MONOABS 0.3 05/25/2017 1035   EOSABS 101 09/07/2017 0903   BASOSABS 18 09/07/2017 0903    BMET    Component Value Date/Time   NA 139 08/17/2017 1043   K 4.0 08/17/2017 1043   CL 104 08/17/2017 1043   CO2 28 08/17/2017 1043   GLUCOSE 88 08/17/2017 1043   BUN 29 (H) 08/17/2017 1043   CREATININE 1.10 (H) 08/17/2017 1043   CALCIUM 9.6 08/17/2017 1043   GFRNONAA >60 05/25/2017 1035   GFRAA >60 05/25/2017 1035    BNP    Component Value Date/Time   BNP 759.0 (H) 04/17/2017 1558    ProBNP No results found for: PROBNP  Imaging: No results found.   Assessment & Plan:   Upper airway cough syndrome Chronic cough on ACE inhibitor suspect cough is multifactorial with possible postnasal drainage, GERD, ACE inhibitor.  Cough is slowly improving off of ACE inhibitor. Would add in Delsym and Zyrtec for cough control and trigger prevention.  Plan  Patient Instructions  Continue on Losartan 100mg  daily , may take at dinner time with food.  Follow up with Primary MD for blood pressure next month as planned.  Would avoid ACE Iinhibitors , like Lisinopril if possible as may make cough worse.  Delsym 2 tsp every 12hr as needed .  May try use Zyrtec 10mg  At bedtime  As needed  Drainage .  Follow up with Dr. Melvyn Novas  In 6-8 weeks and As needed   Please contact office for sooner follow up if  symptoms do not improve or worsen or seek emergency care       Essential hypertension ACE inhibitor was held due to cough.  Patient  blood pressure is adequate on losartan.  Patient does have some nausea associated with losartan.  She can switch this to dinnertime with a meal to see if this alleviates this.  She is to follow with her primary care physician next month for further blood pressure management.  Going forward would avoid ACE inhibitors if possible due to ongoing cough.  DOE (dyspnea on exertion) Suspect dyspnea is multifactorial with underlying congestive heart failure, valvular disease and pulmonary hypertension.  We will continue management under cardiology.  On return can discuss if any possible sleep apnea symptoms are present if so can proceed with a sleep study as patient has underl pulmonary hypertension. Pulmonary function test does not show any COPD.  Doubt has underlying asthma.  Previous CT chest although was not a high resolution CT did not show any concerning areas for possible interstitial lung disease. Patient is return in 6-8 weeks for further evaluation     Rexene Edison, NP 12/07/2017

## 2017-12-07 NOTE — Patient Instructions (Addendum)
Continue on Losartan 100mg  daily , may take at dinner time with food.  Follow up with Primary MD for blood pressure next month as planned.  Would avoid ACE Iinhibitors , like Lisinopril if possible as may make cough worse.  Delsym 2 tsp every 12hr as needed .  May try use Zyrtec 10mg  At bedtime  As needed  Drainage .  Follow up with Dr. Melvyn Novas  In 6-8 weeks and As needed   Please contact office for sooner follow up if symptoms do not improve or worsen or seek emergency care

## 2017-12-13 ENCOUNTER — Ambulatory Visit (INDEPENDENT_AMBULATORY_CARE_PROVIDER_SITE_OTHER): Payer: Medicaid Other | Admitting: General Surgery

## 2017-12-13 VITALS — BP 122/59 | HR 73 | Temp 97.7°F | Resp 18 | Ht 67.0 in | Wt 234.0 lb

## 2017-12-13 DIAGNOSIS — R590 Localized enlarged lymph nodes: Secondary | ICD-10-CM | POA: Diagnosis not present

## 2017-12-13 DIAGNOSIS — N6321 Unspecified lump in the left breast, upper outer quadrant: Secondary | ICD-10-CM

## 2017-12-15 ENCOUNTER — Encounter: Payer: Self-pay | Admitting: General Surgery

## 2017-12-15 ENCOUNTER — Encounter: Payer: Self-pay | Admitting: *Deleted

## 2017-12-15 ENCOUNTER — Other Ambulatory Visit: Payer: Self-pay | Admitting: *Deleted

## 2017-12-15 DIAGNOSIS — D649 Anemia, unspecified: Secondary | ICD-10-CM

## 2017-12-15 NOTE — Progress Notes (Signed)
[facility] History and Physical  Reason for Referral: Left breast mass and left axillary adenopathy, with discordant biopsies  Referring Physician: Dr. [name] Toni [name] is a 62 y.o. female.  HPI:  62 y.o. Female presents to clinic for further discussion of the left breast mass and left axillary adenopathy that was found on CT chest prompting a mammogram and US biopsy that are discordant with the imaging.    She reported a prior history of a left breast biopsy at [facility] in the preceding years. Given the concern for the imaging and the discordant biopsy, she was sent to me for further evaluation and recommendation for surgical biopsy of the left breast mass after needle localization and axillary node dissection, given the US guided biopsy that showed lymphoid tissue, but with at least 7 abnormal lymph nodes on imaging, some 3cm in size.   I had sent her cardiologist a request for risk stratification but this was not adequately communicated, so I sent an Epic message, and her cardiologist who indicated she need no additional testing after their recent visit given recent ECHO and no history CAD and stable mitral valve stenosis. They also indicated that we could hold her aspirin if needed for 5 days prior to the procedure.   The patient was called to discuss the dates for surgery, and was a little confused, so she was brought back for additional discussion.     She says she has had mammograms following that biopsy that were fine but these are not in our system. She experienced menarche was at age 82 years, and her first child was born when she was 42 years old. She did not breast feed. She has 2 paternal aunts with breast cancer in the past.    Past Medical History:  Diagnosis Date  . Congestive heart failure, unspecified    Diastolic heart failure  . Degeneration of lumbar or lumbosacral intervertebral disc   . Depression   . Diverticulosis   . Dyspnea     chronic  multifactorial large component of deconditioning  . Esophageal reflux   . Insomnia    Working third shift  . Mitral stenosis    Secondary to under-sized ring annuloplasty following mitral valve repair  . Mitral valve disorders(424.0)    Mild to moderate mitral regurgitation  . Mitral valve insufficiency and aortic valve insufficiency   . Morbid obesity ([place])   . Normocytic anemia   . Other chest pain   . Other dyspnea and respiratory abnormality   . Paroxysmal atrial fibrillation ([name])    Stable no recurrence  . Peripheral edema    chronic  . S/P Maze operation for atrial fibrillation 02/27/2015  . S/P mitral valve repair 02/27/2015   "radical repair" including resection of P2 segment of posterior leaflet with 26 mm Edwards Physio II annuloplasty ring  . S/P tricuspid valve repair 02/27/2015   26 mm annuloplasty ring  . Unspecified essential hypertension     Past Surgical History:  Procedure Laterality Date  . APPENDECTOMY    . BREAST BIOPSY Left 2009  . CARDIAC VALVE SURGERY  02/27/2015   Mikel Cella. Mitral Valve Repair. Radical Repair with resection of P2 Prolapse, 26 mm Physio II Ring Annuloplasty, Tricuspid Valve Repair (26 mm Ring Annuloplasty, Cox Maze IV Procedure on 02/27/2015  . CHOLECYSTECTOMY    . COLONOSCOPY  2011   Dr. Laural Golden: 53mm polyp from hepatic flexure (tubular adenoma)  . COLONOSCOPY N/A 11/10/2016   Dr. Gala Romney: Diverticulosis.  Next colonoscopy 7 years, January 2025  . ESOPHAGOGASTRODUODENOSCOPY  2005    Dr. Gala Romney : normal esophagus s/p dilation. Small hiatal hernia noted. Felt improved after dilation.   . ESOPHAGOGASTRODUODENOSCOPY N/A 11/10/2016   Dr. Gala Romney: Moderate-sized hiatal hernia, esophagus normal status post empiric dilation for history of dysphagia  . HEEL SPUR SURGERY Right   . MALONEY DILATION N/A 11/10/2016   Procedure: Venia Minks DILATION;  Surgeon: Daneil Dolin, MD;  [facility];  Service: Endoscopy;  Laterality: N/A;  . MINIMALLY INVASIVE  TRICUSPID VALVE REPAIR  02/27/2015   [place]  . MITRAL VALVE REPAIR  02/27/2015   [place]  . TEE WITHOUT CARDIOVERSION N/A 10/18/2016   Procedure: TRANSESOPHAGEAL ECHOCARDIOGRAM (TEE);  Surgeon: Arnoldo Lenis, MD;  [facility];  Service: Endoscopy;  Laterality: N/A;  . TOTAL ABDOMINAL HYSTERECTOMY    . TOTAL KNEE ARTHROPLASTY Right   . TUBAL LIGATION      Family History  Problem Relation Age of Onset  . Hypertension Mother   . Heart disease Mother   . Kidney disease Father   . Stroke Father   . Diabetes Sister   . Kidney disease Sister   . Heart disease Sister   . Hypertension Sister   . Heart disease Brother   . Heart attack Son   . Hypertension Son   . Cancer Paternal Grandmother        Breast Cancer  . Heart disease Brother   . Colon cancer Neg Hx     Social History   Tobacco Use  . Smoking status: Former Smoker    Packs/day: 0.50    Years: 25.00    Pack years: 12.50    Types: Cigarettes    Last attempt to quit: 1997    Years since quitting: 22.1  . Smokeless tobacco: Former Systems developer    Types: Chew    Quit date: 7  . Tobacco comment: dipped tobacco during pregancy in 1975  Substance Use Topics  . Alcohol use: No  . Drug use: No    Medications: I have reviewed the patient's current medications. Allergies as of 12/13/2017      Reactions   Iodinated Diagnostic Agents Swelling   EYES      Medication List        Accurate as of 12/13/17 11:59 PM. Always use your most recent med list.          albuterol 108 (90 Base) MCG/ACT inhaler Commonly known as:  PROVENTIL HFA;VENTOLIN HFA Inhale 2 puffs into the lungs every 6 (six) hours as needed for wheezing or shortness of breath.   amLODipine 5 MG tablet Commonly known as:  NORVASC Take 1 tablet (5 mg total) by mouth daily.   aspirin EC 81 MG tablet Take 1 tablet (81 mg total) by mouth daily.   cyclobenzaprine 10 MG tablet Commonly  known as:  FLEXERIL Take 1 tablet (10 mg total) by mouth 3 (three) times daily as needed for muscle spasms.   FLUoxetine 40 MG capsule Commonly known as:  PROZAC Take 1 capsule (40 mg total) by mouth daily.   gabapentin 300 MG capsule Commonly known as:  NEURONTIN Take one tablet in the morning and three tablets in the evening as directed.   indomethacin 25 MG capsule Commonly known as:  INDOCIN 1 po q 8 hour prn gout pain   linaclotide 72 MCG capsule Commonly known as:  LINZESS Take 1 capsule (72 mcg total)  by mouth daily before breakfast.   losartan 100 MG tablet Commonly known as:  COZAAR Take 1 tablet (100 mg total) by mouth daily.   magnesium gluconate 500 MG tablet Commonly known as:  MAGONATE Take 500 mg by mouth daily.   metoprolol tartrate 100 MG tablet Commonly known as:  LOPRESSOR Take 1 tablet (100 mg total) by mouth 2 (two) times daily.   omeprazole 40 MG capsule Commonly known as:  PRILOSEC Take 1 capsule (40 mg total) by mouth daily.   SALONPAS PAIN RELIEF PATCH EX Apply 1 patch topically daily as needed (pain).   torsemide 20 MG tablet Commonly known as:  DEMADEX Take 1 tablet (20 mg total) by mouth daily.       ROS:  A comprehensive review of systems was negative except for: Respiratory: positive for SOB Cardiovascular: positive for HTN, no chest pain Musculoskeletal: positive for back pain, neck pain and stiff joints  Blood pressure (!) 122/59, pulse 73, temperature 97.7 F (36.5 C), resp. rate 18, height 5\' 7"  (1.702 m), weight 234 lb (106.1 kg). Physical Exam:  Physical Exam  Constitutional: She is oriented to person, place, and time and well-developed, well-nourished, and in no distress.  HENT:  Head: Normocephalic.  Eyes: Pupils are equal, round, and reactive to light.  Neck: Normal range of motion.  Cardiovascular: Normal rate.  Pulmonary/Chest: Effort normal and breath sounds normal.  Left breast without masses, ecchymosis improved  from prior biopsy site, some small adenopathy  Musculoskeletal: Normal range of motion.  Lymphadenopathy:    She has axillary adenopathy.  Left sided  Neurological: She is alert and oriented to person, place, and time.  Vitals reviewed.  Results: Results: CT 08/2017  Personally reviewed- left axillary adenopathy appreciated, dense breast tissue IMPRESSION: 1. No acute findings or explanation for the patient's symptoms identified. 2. Asymmetric left axillary and subpectoral lymphadenopathy. Potential explanations include metastatic breast cancer and lymphoproliferative process. Diagnostic mammography recommended if not recently performed. 3. Stable cardiomegaly post mitral and tricuspid valve surgery. 4. Aortic Atherosclerosis (ICD10-I70.0). 5. These results will be called to the ordering clinician or representative by the Radiologist Assistant, and communication documented in the PACS or Vision Dashboard.  Mammogram/US 09/2017  Multiple enlarged left axillary lymph nodes corresponding well to the recent CT exam. The largest lymph node demonstrates markedly thickened cortex and measures 3.3 cm in length. Nodes are associated with hypervascularity. Nodes extend into the retropectoral region. At real-time scanning, I count at least 7 abnormal lymph nodes.  Within the 12:30 o'clock location left breast 3 cm from the nipple, just medial and discrete from the surgical scar, there is an irregular mass associated with dense acoustic shadowing measuring 1.2 x 1.1 x 0.8 cm. IMPRESSION: 1. Suspicious mass in the 12:30 o'clock location of the left breast. Although this mass appears discrete from the previous benign surgical excisional biopsy scar, considerations include fat necrosis or malignancy. 2. Enlarged left axillary lymph nodes, suspicious for metastatic disease or lymphoproliferative disease  US guided biopsy 12/4 with clip placement   Pathology: Discordant per the  radiologist with imaging  1. Breast, left, needle core biopsy, 12:30; 3 cmn - FIBROCYSTIC CHANGES. - NO EVIDENCE OF MALIGNANCY. 2. Lymph node, needle/core biopsy, left axilla - REACTIVE LYMPHOID TISSUE. Tissue-Flow Cytometry - NO MONOCLONAL B-CELL OR PHENOTYPICALLY ABERRANT T-CELL POPULATION IDENTIFIED.  Risk Calculator- 5 year risk 1.7% of breast cancer (with unknown biopsy results from left breast prior); If atypical hyperplasia on that prior biospy then risk is 3.1%  Life time risk 6.8% of breast cancer (with unknown biopsy results from the left breast prior); If atypical hyperplasia on that prior biospy then risk is 11.9%  ECHO:  Mild LVH with LVEF approximately 45-50%. Indeterminate diastolic   function. Severe left atrial enlargement. Findings consistent   with severe mitral stenosis as detailed above. Moderately reduced   right ventricular contraction. Mild tricuspid regurgitation with   elevated PASP estimated 58 mmHg.  Assessment & Plan:  Toni [name] is a 62 y.o. female  with a left breast mass that is approximate 1.5x1cm and axillary adenopathy measuring up to 3.3cm that has biopsy results that are discordant with her imaging study. She has a slightly increased risk above the average population with her prior biopsy and could have an even higher risk if there was any atypical hyperplasia in the biopsy but we do not have these results. There is definitely a scar from her prior excisional biopsy that I appreciate on exam but based on the imaging report this is separate from the lesion in question that was biopsied and a clip was placed. The patient also has multiple lymph nodes that are enlarged and suspicious at this time.   -Cardiology has deemed the patient an acceptable risk (See Telephone encounter 11/30/17) with no further testing needed  -Plan for Partial mastectomy after needle localization (to ensure removing the biopsy clip and not just scar from prior biopsy site) and full  axillary node dissection given the clinically aparent adenopathy and concern on imaging; she has already had a lymph node biopsy and a sentinel node is not indicated in this situation   All questions were answered to the satisfaction of the patient.  We have discussed the options for surgery including lumpectomy and if positive for cancer, the need for radiation following surgery. We have discussed that she will be referred to oncology after our procedure to further discuss her options for chemotherapy and hormonal therapy if she qualifies.   We have discussed she will have a needle placed into the area where the biopsy was performed, since we cannot palpate a mass.  We have discussed that the sentinel node biopsy is not indicated due to the clinically positive concern for nodes and the negative US guided biopsy that is discordant with the imaging. We have discussed lymphedema risk after axillary dissection.   We have discussed that if the lumpectomy does not remove the entire cancer that she may have to have an additional procedure.  We have discussed that these are big discussions, and that the risk from the operations are similar including risk of bleeding, risk of infection, and risk of needing additional surgeries. We have discussed that a drain will likely be placed after the axillary dissection.     [name] 12/15/2017, 11:44 AM

## 2017-12-20 ENCOUNTER — Ambulatory Visit: Payer: Medicaid Other | Admitting: Family Medicine

## 2017-12-20 ENCOUNTER — Ambulatory Visit (INDEPENDENT_AMBULATORY_CARE_PROVIDER_SITE_OTHER): Payer: Medicaid Other | Admitting: Family Medicine

## 2017-12-20 ENCOUNTER — Other Ambulatory Visit: Payer: Self-pay | Admitting: General Surgery

## 2017-12-20 ENCOUNTER — Other Ambulatory Visit: Payer: Self-pay

## 2017-12-20 ENCOUNTER — Encounter: Payer: Self-pay | Admitting: Family Medicine

## 2017-12-20 VITALS — BP 122/64 | HR 59 | Temp 97.5°F | Resp 16 | Ht 67.0 in | Wt 232.0 lb

## 2017-12-20 DIAGNOSIS — T50905A Adverse effect of unspecified drugs, medicaments and biological substances, initial encounter: Secondary | ICD-10-CM

## 2017-12-20 DIAGNOSIS — J40 Bronchitis, not specified as acute or chronic: Secondary | ICD-10-CM | POA: Diagnosis not present

## 2017-12-20 DIAGNOSIS — F419 Anxiety disorder, unspecified: Secondary | ICD-10-CM

## 2017-12-20 DIAGNOSIS — I10 Essential (primary) hypertension: Secondary | ICD-10-CM | POA: Diagnosis not present

## 2017-12-20 DIAGNOSIS — R0609 Other forms of dyspnea: Secondary | ICD-10-CM

## 2017-12-20 DIAGNOSIS — N6320 Unspecified lump in the left breast, unspecified quadrant: Secondary | ICD-10-CM

## 2017-12-20 DIAGNOSIS — R001 Bradycardia, unspecified: Secondary | ICD-10-CM | POA: Diagnosis not present

## 2017-12-20 DIAGNOSIS — R0600 Dyspnea, unspecified: Secondary | ICD-10-CM

## 2017-12-20 DIAGNOSIS — R591 Generalized enlarged lymph nodes: Secondary | ICD-10-CM

## 2017-12-20 MED ORDER — IPRATROPIUM BROMIDE 0.02 % IN SOLN
0.5000 mg | Freq: Once | RESPIRATORY_TRACT | Status: AC
  Administered 2017-12-20: 0.5 mg via RESPIRATORY_TRACT

## 2017-12-20 MED ORDER — ALBUTEROL SULFATE (2.5 MG/3ML) 0.083% IN NEBU
2.5000 mg | INHALATION_SOLUTION | Freq: Once | RESPIRATORY_TRACT | Status: AC
  Administered 2017-12-20: 2.5 mg via RESPIRATORY_TRACT

## 2017-12-20 MED ORDER — DOXYCYCLINE HYCLATE 100 MG PO TABS
100.0000 mg | ORAL_TABLET | Freq: Two times a day (BID) | ORAL | 0 refills | Status: DC
Start: 2017-12-20 — End: 2018-01-17

## 2017-12-20 MED ORDER — METOPROLOL TARTRATE 50 MG PO TABS: ORAL_TABLET | ORAL | 1 refills | Status: DC

## 2017-12-20 NOTE — Progress Notes (Signed)
Patient ID: Toni [name], female    DOB: [birth year], 62 y.o.   MRN: [id number]  Chief Complaint  Patient presents with  . Follow-up    Allergies Iodinated diagnostic agents  Subjective:   Toni [name] is a 62 y.o. female who presents to [facility] today.  HPI Toni [name] presents today for a follow up visit.  She reports that she has another upcoming visit with pulmonology, surgery, and cardiology.  She reports that her shortness of breath is basically the same as it was the last time she was here.  She does report that over the past couple weeks she has had more of a cough with increased sputum production.  She has an albuterol inhaler at home but has not used it.  She reports chest congestion.  She still reports that she gets winded with walking, but no different from her baseline.  She reports she did see the cardiologist and did tell them she was not interested at this point in having valve replacement surgery again.  She reports she is taking her medications as directed. She denies any fevers, chills, night sweats.  Denies any sore throat or sinus pain.  Reports that she is taking the Prozac and her mood is improved.  She does not feel sad, down, or hopeless.  Denies any suicidal or homicidal ideation.  Has recently been seen by GI for globus sensation.  She is taking her Linzess and omeprazole as directed.  Reports that her appetite is good.  When asked if her pulse is always this low, she reports that it is been low on multiple other occasions.  She reports that Toni [name] was concerned about her low heart rate at the last visit with him.    Past Medical History:  Diagnosis Date  . Congestive heart failure, unspecified    Diastolic heart failure  . Degeneration of lumbar or lumbosacral intervertebral disc   . Depression   . Diverticulosis   . Dyspnea     chronic multifactorial large component of deconditioning  . Esophageal reflux   . Insomnia    Working third  shift  . Mitral stenosis    Secondary to under-sized ring annuloplasty following mitral valve repair  . Mitral valve disorders(424.0)    Mild to moderate mitral regurgitation  . Mitral valve insufficiency and aortic valve insufficiency   . Morbid obesity ([place])   . Normocytic anemia   . Other chest pain   . Other dyspnea and respiratory abnormality   . Paroxysmal atrial fibrillation ([name])    Stable no recurrence  . Peripheral edema    chronic  . S/P Maze operation for atrial fibrillation 02/27/2015  . S/P mitral valve repair 02/27/2015   "radical repair" including resection of P2 segment of posterior leaflet with 26 mm Edwards Physio II annuloplasty ring  . S/P tricuspid valve repair 02/27/2015   26 mm annuloplasty ring  . Unspecified essential hypertension     Past Surgical History:  Procedure Laterality Date  . APPENDECTOMY    . BREAST BIOPSY Left 2009  . CARDIAC VALVE SURGERY  02/27/2015   Toni [name]. Mitral Valve Repair. Radical Repair with resection of P2 Prolapse, 26 mm Physio II Ring Annuloplasty, Tricuspid Valve Repair (26 mm Ring Annuloplasty, Cox Maze IV Procedure on 02/27/2015  . CHOLECYSTECTOMY    . COLONOSCOPY  2011   Dr. Laural [name]: 62mm polyp from hepatic flexure (tubular adenoma)  . COLONOSCOPY N/A 11/10/2016   Dr. Gala [name]: Diverticulosis. Next  colonoscopy 7 years, January 2025  . ESOPHAGOGASTRODUODENOSCOPY  2005    Dr. Gala [name] : normal esophagus s/p dilation. Small hiatal hernia noted. Felt improved after dilation.   . ESOPHAGOGASTRODUODENOSCOPY N/A 11/10/2016   Dr. Gala [name]: Moderate-sized hiatal hernia, esophagus normal status post empiric dilation for history of dysphagia  . HEEL SPUR SURGERY Right   . MALONEY DILATION N/A 11/10/2016   Procedure: Toni [name] DILATION;  Surgeon: Toni Dolin, MD;  [facility];  Service: Endoscopy;  Laterality: N/A;  . MINIMALLY INVASIVE TRICUSPID VALVE REPAIR  02/27/2015   [place]  . MITRAL VALVE REPAIR   02/27/2015   [place]  . TEE WITHOUT CARDIOVERSION N/A 10/18/2016   Procedure: TRANSESOPHAGEAL ECHOCARDIOGRAM (TEE);  Surgeon: Toni Lenis, MD;  [facility];  Service: Endoscopy;  Laterality: N/A;  . TOTAL ABDOMINAL HYSTERECTOMY    . TOTAL KNEE ARTHROPLASTY Right   . TUBAL LIGATION      Family History  Problem Relation Age of Onset  . Hypertension Mother   . Heart disease Mother   . Kidney disease Father   . Stroke Father   . Diabetes Sister   . Kidney disease Sister   . Heart disease Sister   . Hypertension Sister   . Heart disease Brother   . Heart attack Son   . Hypertension Son   . Cancer Paternal Grandmother        Breast Cancer  . Heart disease Brother   . Colon cancer Neg Hx      Social History   Socioeconomic History  . Marital status: Single    Spouse name: None  . Number of children: None  . Years of education: None  . Highest education level: None  Social Needs  . Financial resource strain: None  . Food insecurity - worry: None  . Food insecurity - inability: None  . Transportation needs - medical: None  . Transportation needs - non-medical: None  Occupational History  . None  Tobacco Use  . Smoking status: Former Smoker    Packs/day: 0.50    Years: 25.00    Pack years: 12.50    Types: Cigarettes    Last attempt to quit: 1997    Years since quitting: 22.1  . Smokeless tobacco: Former Systems developer    Types: Chew    Quit date: 52  . Tobacco comment: dipped tobacco during pregancy in 1975  Substance and Sexual Activity  . Alcohol use: No  . Drug use: No  . Sexual activity: Yes    Partners: Male  Other Topics Concern  . None  Social History Narrative   Lives alone or son will stay with her at times. Cooks. Eats well.    Current Outpatient Medications on File Prior to Visit  Medication Sig Dispense Refill  . albuterol (PROVENTIL HFA;VENTOLIN HFA) 108 (90 Base) MCG/ACT inhaler Inhale 2 puffs into the lungs  every 6 (six) hours as needed for wheezing or shortness of breath. 1 Inhaler 3  . amLODipine (NORVASC) 5 MG tablet Take 1 tablet (5 mg total) by mouth daily. 90 tablet 3  . aspirin EC 81 MG tablet Take 1 tablet (81 mg total) by mouth daily. 90 tablet 3  . cyclobenzaprine (FLEXERIL) 10 MG tablet Take 1 tablet (10 mg total) by mouth 3 (three) times daily as needed for muscle spasms. 30 tablet 0  . FLUoxetine (PROZAC) 40 MG capsule Take 1 capsule (40 mg total) by mouth daily. [place]  capsule 1  . gabapentin (NEURONTIN) 300 MG capsule Take one tablet in the morning and three tablets in the evening as directed. 270 capsule 1  . indomethacin (INDOCIN) 25 MG capsule 1 po q 8 hour prn gout pain 20 capsule 1  . linaclotide (LINZESS) 72 MCG capsule Take 1 capsule (72 mcg total) by mouth daily before breakfast. 90 capsule 3  . Liniments (SALONPAS PAIN RELIEF PATCH EX) Apply 1 patch topically daily as needed (pain).    Marland Kitchen losartan (COZAAR) 100 MG tablet Take 1 tablet (100 mg total) by mouth daily. 30 tablet 11  . magnesium gluconate (MAGONATE) 500 MG tablet Take 500 mg by mouth daily.    Marland Kitchen omeprazole (PRILOSEC) 40 MG capsule Take 1 capsule (40 mg total) by mouth daily. 90 capsule 1  . torsemide (DEMADEX) 20 MG tablet Take 1 tablet (20 mg total) by mouth daily. 90 tablet 3   No current facility-administered medications on file prior to visit.     Review of Systems  Constitutional: Negative for activity change, appetite change, chills, diaphoresis, fever and unexpected weight change.  HENT: Negative for ear pain, sore throat, trouble swallowing and voice change.   Respiratory: Positive for cough. Negative for chest tightness and stridor.   Cardiovascular: Negative for chest pain, palpitations and leg swelling.  Gastrointestinal: Negative for abdominal pain.  Genitourinary: Negative for decreased urine volume, dysuria and frequency.  Musculoskeletal: Negative for back pain and myalgias.  Skin: Negative for rash.   Psychiatric/Behavioral: Negative for behavioral problems, dysphoric mood and suicidal ideas. The patient is not nervous/anxious and is not hyperactive.      Objective:   BP 122/64 (BP Location: Left Arm, Patient Position: Sitting, Cuff Size: Normal)   Pulse (!) 59   Temp (!) 97.5 F (36.4 C) (Temporal)   Resp 16   Ht 5\' 7"  (1.702 m)   Wt 232 lb (105.2 kg)   LMP  (LMP Unknown)   SpO2 91%   BMI 36.34 kg/m   Physical Exam  Constitutional: She is oriented to person, place, and time. She appears well-developed and well-nourished.  HENT:  Head: Normocephalic and atraumatic.  Eyes: EOM are normal. Pupils are equal, round, and reactive to light.  Neck: Normal range of motion. Neck supple.  Cardiovascular: Normal rate and regular rhythm.  Pulmonary/Chest: Effort normal and breath sounds normal.  Abdominal: Soft. Bowel sounds are normal.  Musculoskeletal: Normal range of motion.  Neurological: She is alert and oriented to person, place, and time. No cranial nerve deficit.  Skin: Skin is warm and dry.  Psychiatric: She has a normal mood and affect. Her behavior is normal. Judgment and thought content normal.  Vitals reviewed.    Assessment and Plan  1. Bronchitis Treat for bronchitis at this time due to duration of symptoms and increased sputum production.  Patient was told that she could use her albuterol inhaler 2 puffs every 4-6 hours as needed. - doxycycline (VIBRA-TABS) 100 MG tablet; Take 1 tablet (100 mg total) by mouth 2 (two) times daily.  Dispense: 20 tablet; Refill: 0 Patient was given nebulizer therapy in office today with improvement in her symptoms. - albuterol (PROVENTIL) (2.5 MG/3ML) 0.083% nebulizer solution 2.5 mg - ipratropium (ATROVENT) nebulizer solution 0.5 mg  2. Drug-induced bradycardia Decrease medication due to low heart rate. - metoprolol tartrate (LOPRESSOR) 50 MG tablet; Take 1 1/2 tablet by mouth twice a day  Dispense: 90 tablet; Refill: 1  3. DOE  (dyspnea on exertion) Patient with history  of chronic dyspnea on exertion, worse over the past several months.  History of chronic diastolic heart failure and of valve repair, recent visit with cardiology.  Cardiology felt patient was medically managed at this time and not a good candidate for valve repair again.  Also with evaluation from pulmonology.  Upcoming follow-up with Toni [name].  Patient with significant history of tobacco use, but no diagnosis of COPD.  Patient without worsening symptoms at this time.  She was counseled that if her symptoms worsen if she develops acute breathing issues that she needs to go to the emergency department.  Likely her symptoms are mixed cardiac and pulmonary in etiology.  Patient agrees to keep her scheduled appointments with Dr. Harl Bowie and Toni [name].   4. Essential hypertension Patient with good blood pressure control today.  However several instances of pulse being too low.  Could possibly be contributing to her fatigue.  Will decrease her metoprolol tartrate to 75 mg twice a day.  We will plan to recheck on a month and adjust dose as needed.  5. Anxiety Continue Prozac as directed.  Patient was told to call with any problems or changes in her mood.  Keep regularly scheduled visit. Suicide mood precautions given and discussed with patient.  Patient will keep her visit with Dr. Constance Haw regarding breast surgery/lymph nodes.  Return in about 4 weeks (around 01/17/2018) for Check blood pressure/pulse. Caren Macadam, MD 12/20/2017

## 2017-12-20 NOTE — Patient Instructions (Addendum)
Stop the metoprolol tartrate 100 mg twice a day   START  metroprolol tartrate 50 mg, 1/12 pills twice a day as directed  Keep appointment with Dr. Harl Bowie and Dr. Melvyn Novas

## 2017-12-22 NOTE — H&P (Signed)
[facility] History and Physical  Reason for Referral:Left breast mass and left axillary adenopathy, with discordant biopsies Referring Physician:Dr. Jenni Thew is a 62 y.o. female.  HPI:  62 y.o. Female presents to clinic for further discussion of the left breast mass and left axillary adenopathy that was found on CT chest prompting a mammogram and US biopsy that are discordant with the imaging.    She reported a prior history of a left breast biopsy at [facility] in the preceding years. Given the concern for the imaging and the discordant biopsy, she was sent to me for further evaluation and recommendation for surgical biopsy of the left breast mass after needle localization and axillary node dissection, given the US guided biopsy that showed lymphoid tissue, but with at least 7 abnormal lymph nodes on imaging, some 3cm in size.   I had sent her cardiologist a request for risk stratification but this was not adequately communicated, so I sent an Epic message, and her cardiologist who indicated she need no additional testing after their recent visit given recent ECHO and no history CAD and stable mitral valve stenosis. They also indicated that we could hold her aspirin if needed for 5 days prior to the procedure.   The patient was called to discuss the dates for surgery, and was a little confused, so she was brought back for additional discussion.     She says she has had mammograms following that biopsy that were fine but these are not in our system. She experienced menarche was at age 72 years, and her first child was born when she was 31 years old. She did not breast feed. She has 2 paternal aunts with breast cancer in the past.        Past Medical History:  Diagnosis Date  . Congestive heart failure, unspecified    Diastolic heart failure  . Degeneration of lumbar or lumbosacral intervertebral disc   . Depression   . Diverticulosis   . Dyspnea      chronic multifactorial large component of deconditioning  . Esophageal reflux   . Insomnia    Working third shift  . Mitral stenosis    Secondary to under-sized ring annuloplasty following mitral valve repair  . Mitral valve disorders(424.0)    Mild to moderate mitral regurgitation  . Mitral valve insufficiency and aortic valve insufficiency   . Morbid obesity ([place])   . Normocytic anemia   . Other chest pain   . Other dyspnea and respiratory abnormality   . Paroxysmal atrial fibrillation ([name])    Stable no recurrence  . Peripheral edema    chronic  . S/P Maze operation for atrial fibrillation 02/27/2015  . S/P mitral valve repair 02/27/2015   "radical repair" including resection of P2 segment of posterior leaflet with 26 mm Edwards Physio II annuloplasty ring  . S/P tricuspid valve repair 02/27/2015   26 mm annuloplasty ring  . Unspecified essential hypertension          Past Surgical History:  Procedure Laterality Date  . APPENDECTOMY    . BREAST BIOPSY Left 2009  . CARDIAC VALVE SURGERY  02/27/2015   Mikel Cella. Mitral Valve Repair. Radical Repair with resection of P2 Prolapse, 26 mm Physio II Ring Annuloplasty, Tricuspid Valve Repair (26 mm Ring Annuloplasty, Cox Maze IV Procedure on 02/27/2015  . CHOLECYSTECTOMY    . COLONOSCOPY  2011   Dr. Laural Golden: 67mm polyp from hepatic flexure (tubular adenoma)  . COLONOSCOPY N/A 11/10/2016  Dr. Gala Romney: Diverticulosis. Next colonoscopy 7 years, January 2025  . ESOPHAGOGASTRODUODENOSCOPY  2005    Dr. Gala Romney : normal esophagus s/p dilation. Small hiatal hernia noted. Felt improved after dilation.   . ESOPHAGOGASTRODUODENOSCOPY N/A 11/10/2016   Dr. Gala Romney: Moderate-sized hiatal hernia, esophagus normal status post empiric dilation for history of dysphagia  . HEEL SPUR SURGERY Right   . MALONEY DILATION N/A 11/10/2016   Procedure: Venia Minks DILATION;  Surgeon: Daneil Dolin, MD;  Location: [facility];   Service: Endoscopy;  Laterality: N/A;  . MINIMALLY INVASIVE TRICUSPID VALVE REPAIR  02/27/2015   Roff  . MITRAL VALVE REPAIR  02/27/2015   Mitchell  . TEE WITHOUT CARDIOVERSION N/A 10/18/2016   Procedure: TRANSESOPHAGEAL ECHOCARDIOGRAM (TEE);  Surgeon: Arnoldo Lenis, MD;  Location: [facility];  Service: Endoscopy;  Laterality: N/A;  . TOTAL ABDOMINAL HYSTERECTOMY    . TOTAL KNEE ARTHROPLASTY Right   . TUBAL LIGATION           Family History  Problem Relation Age of Onset  . Hypertension Mother   . Heart disease Mother   . Kidney disease Father   . Stroke Father   . Diabetes Sister   . Kidney disease Sister   . Heart disease Sister   . Hypertension Sister   . Heart disease Brother   . Heart attack Son   . Hypertension Son   . Cancer Paternal Grandmother        Breast Cancer  . Heart disease Brother   . Colon cancer Neg Hx     Social History        Tobacco Use  . Smoking status: Former Smoker    Packs/day: 0.50    Years: 25.00    Pack years: 12.50    Types: Cigarettes    Last attempt to quit: 1997    Years since quitting: 22.1  . Smokeless tobacco: Former Systems developer    Types: Chew    Quit date: 85  . Tobacco comment: dipped tobacco during pregancy in 1975  Substance Use Topics  . Alcohol use: No  . Drug use: No    Medications: I have reviewed the patient's current medications.     Allergies as of 12/13/2017      Reactions   Iodinated Diagnostic Agents Swelling   EYES                  Medication List             Accurate as of 12/13/17 11:59 PM. Always use your most recent med list.           albuterol 108 (90 Base) MCG/ACT inhaler Commonly known as:  PROVENTIL HFA;VENTOLIN HFA Inhale 2 puffs into the lungs every 6 (six) hours as needed for wheezing or shortness of breath.   amLODipine 5 MG tablet Commonly known as:  NORVASC Take  1 tablet (5 mg total) by mouth daily.   aspirin EC 81 MG tablet Take 1 tablet (81 mg total) by mouth daily.   cyclobenzaprine 10 MG tablet Commonly known as:  FLEXERIL Take 1 tablet (10 mg total) by mouth 3 (three) times daily as needed for muscle spasms.   FLUoxetine 40 MG capsule Commonly known as:  PROZAC Take 1 capsule (40 mg total) by mouth daily.   gabapentin 300 MG capsule Commonly known as:  NEURONTIN Take one tablet in the morning and three tablets in the evening as directed.  indomethacin 25 MG capsule Commonly known as:  INDOCIN 1 po q 8 hour prn gout pain   linaclotide 72 MCG capsule Commonly known as:  LINZESS Take 1 capsule (72 mcg total) by mouth daily before breakfast.   losartan 100 MG tablet Commonly known as:  COZAAR Take 1 tablet (100 mg total) by mouth daily.   magnesium gluconate 500 MG tablet Commonly known as:  MAGONATE Take 500 mg by mouth daily.   metoprolol tartrate 100 MG tablet Commonly known as:  LOPRESSOR Take 1 tablet (100 mg total) by mouth 2 (two) times daily.   omeprazole 40 MG capsule Commonly known as:  PRILOSEC Take 1 capsule (40 mg total) by mouth daily.   SALONPAS PAIN RELIEF PATCH EX Apply 1 patch topically daily as needed (pain).   torsemide 20 MG tablet Commonly known as:  DEMADEX Take 1 tablet (20 mg total) by mouth daily.       ROS:  A comprehensive review of systems was negative except for: Respiratory: positive for SOB Cardiovascular: positive for HTN, no chest pain Musculoskeletal: positive for back pain, neck pain and stiff joints  Blood pressure (!) 122/59, pulse 73, temperature 97.7 F (36.5 C), resp. rate 18, height 5\' 7"  (1.702 m), weight 234 lb (106.1 kg). Physical Exam:  Physical Exam  Constitutional: She is oriented to person, place, and time and well-developed, well-nourished, and in no distress.  HENT:  Head: Normocephalic.  Eyes: Pupils are equal, round, and reactive to light.    Neck: Normal range of motion.  Cardiovascular: Normal rate.  Pulmonary/Chest: Effort normal and breath sounds normal.  Left breast without masses, ecchymosis improved from prior biopsy site, some small adenopathy  Musculoskeletal: Normal range of motion.  Lymphadenopathy:    She has axillary adenopathy.  Left sided  Neurological: She is alert and oriented to person, place, and time.  Vitals reviewed.  Results: Results: CT 08/2017  Personally reviewed- left axillary adenopathy appreciated, dense breast tissue IMPRESSION: 1. No acute findings or explanation for the patient's symptoms identified. 2. Asymmetric left axillary and subpectoral lymphadenopathy. Potential explanations include metastatic breast cancer and lymphoproliferative process. Diagnostic mammography recommended if not recently performed. 3. Stable cardiomegaly post mitral and tricuspid valve surgery. 4. Aortic Atherosclerosis (ICD10-I70.0). 5. These results will be called to the ordering clinician or representative by the Radiologist Assistant, and communication documented in the PACS or Vision Dashboard.  Mammogram/US 09/2017  Multiple enlarged left axillary lymph nodes corresponding well to the recent CT exam. The largest lymph node demonstrates markedly thickened cortex and measures 3.3 cm in length. Nodes are associated with hypervascularity. Nodes extend into the retropectoral region. At real-time scanning, I count at least 7 abnormal lymph nodes.  Within the 12:30 o'clock location left breast 3 cm from the nipple, just medial and discrete from the surgical scar, there is an irregular mass associated with dense acoustic shadowing measuring 1.2 x 1.1 x 0.8 cm. IMPRESSION: 1. Suspicious mass in the 12:30 o'clock location of the left breast. Although this mass appears discrete from the previous benign surgical excisional biopsy scar, considerations include fat necrosis or malignancy. 2. Enlarged  left axillary lymph nodes, suspicious for metastatic disease or lymphoproliferative disease  US guided biopsy 12/4 with clip placement   Pathology: Discordant per the radiologist with imaging 1. Breast, left, needle core biopsy, 12:30; 3 cmn - FIBROCYSTIC CHANGES. - NO EVIDENCE OF MALIGNANCY. 2. Lymph node, needle/core biopsy, left axilla - REACTIVE LYMPHOID TISSUE. Tissue-Flow Cytometry - NO MONOCLONAL B-CELL OR  PHENOTYPICALLY ABERRANT T-CELL POPULATION IDENTIFIED.  Risk Calculator- 5 year risk 1.7% of breast cancer (with unknown biopsy results from left breast prior); If atypical hyperplasia on that prior biospy then risk is 3.1% Life time risk 6.8% of breast cancer (with unknown biopsy results from the left breast prior); If atypical hyperplasia on that prior biospy then risk is 11.9%  ECHO:  Mild LVH with LVEF approximately 45-50%. Indeterminate diastolic function. Severe left atrial enlargement. Findings consistent with severe mitral stenosis as detailed above. Moderately reduced right ventricular contraction. Mild tricuspid regurgitation with elevated PASP estimated 58 mmHg.  Assessment & Plan:  Toni [name] is a 62 y.o. female with a left breast mass that is approximate 1.5x1cm and axillary adenopathy measuring up to 3.3cm that has biopsy results that are discordant with her imaging study. She has a slightly increased risk above the average population with her prior biopsy and could have an even higher risk if there was any atypical hyperplasia in the biopsy but we do not have these results. There is definitely a scar from her prior excisional biopsy that I appreciate on exam but based on the imaging report this is separate from the lesion in question that was biopsied and a clip was placed. The patient also has multiple lymph nodes that are enlarged and suspicious at this time.  -Cardiology has deemed the patient an acceptable risk (See Telephone encounter  11/30/17) with no further testing needed  -Plan for Partial mastectomy after needle localization (to ensure removing the biopsy clip and not just scar from prior biopsy site) and full axillary node dissection given the clinically aparent adenopathy and concern on imaging; she has already had a lymph node biopsy and a sentinel node is not indicated in this situation   All questions were answered to the satisfaction of the patient.  We have discussed the options for surgery including lumpectomy and if positive for cancer, the need for radiation following surgery. We have discussed that she will be referred to oncology after our procedure to further discuss her options for chemotherapy and hormonal therapy if she qualifies.   We have discussed she will have a needle placed into the area where the biopsy was performed, since we cannot palpate a mass.  We have discussed that the sentinel node biopsy is not indicated due to the clinically positive concern for nodes and the negative US guided biopsy that is discordant with the imaging. We have discussed lymphedema risk after axillary dissection.   We have discussed that if the lumpectomy does not remove the entire cancer that she may have to have an additional procedure.  We have discussed that these are big discussions, and that the risk from the operations are similar including risk of bleeding, risk of infection, and risk of needing additional surgeries. We have discussed that a drain will likely be placed after the axillary dissection.     [name] 12/15/2017, 11:44 AM

## 2017-12-24 ENCOUNTER — Other Ambulatory Visit: Payer: Self-pay | Admitting: Family Medicine

## 2017-12-24 DIAGNOSIS — F419 Anxiety disorder, unspecified: Secondary | ICD-10-CM

## 2017-12-26 NOTE — Patient Instructions (Signed)
Toni [name]  12/26/2017     @PREFPERIOPPHARMACY @   Your procedure is scheduled on  01/04/2018 .  Report to [facility] at  [email or web address].  Call this number if you have problems the morning of surgery:  [phone]   Remember:  Do not eat food or drink liquids after midnight.  Take these medicines the morning of surgery with A SIP OF WATER  Amlodipine, flexaril, prozac, gabapentin, losartan, metoprolol, prilosec. Use your inhaler before you come.   Do not wear jewelry, make-up or nail polish.  Do not wear lotions, powders, or perfumes, or deodorant.  Do not shave 48 hours prior to surgery.  Men may shave face and neck.  Do not bring valuables to the hospital.  [facility] is not responsible for any belongings or valuables.  Contacts, dentures or bridgework may not be worn into surgery.  Leave your suitcase in the car.  After surgery it may be brought to your room.  For patients admitted to the hospital, discharge time will be determined by your treatment team.  Patients discharged the day of surgery will not be allowed to drive home.   Name and phone number of your driver:   family Special instructions:  None  Please read over the following fact sheets that you were given. Anesthesia Post-op Instructions and Care and Recovery After Surgery      Partial Mastectomy With or Without Axillary Lymph Node Removal Partial mastectomy with or without axillary lymph node removal is a surgery to remove breast cancer. It is a type of breast-conserving surgery. This means that the cancerous tissue is removed but the breast remains intact. During this procedure, the tumor and a small rim of healthy tissue surrounding it will be removed. Lymph nodes under your arm may also be removed and tested to find out if the cancer has spread. Let your health care provider know about:  Any allergies you have.  All medicines you are taking, including vitamins, herbs, eye drops, creams,  and over-the-counter medicines.  Previous problems you or members of your family have had with the use of anesthetics.  Any blood disorders you have.  Any surgeries you have had.  Any medical conditions you have. What are the risks? Generally, this is a safe procedure. However, problems may occur, including:  A change in the way your breast looks and feels.  Breast pain.  Infection.  Bleeding.  Pain, swelling, weakness, or numbness in the arm on the side of your surgery.  What happens before the procedure?  Ask your health care provider about: ? Changing or stopping your regular medicines. This is especially important if you are taking diabetes medicines or blood thinners. ? Taking medicines such as aspirin and ibuprofen. These medicines can thin your blood. Do not take these medicines before your procedure if your health care provider instructs you not to.  Follow your health care provider's instructions about eating or drinking restrictions.  You may be checked for extra fluid around your lymph nodes (lymphedema). What happens during the procedure?  An IV tube will be inserted into one of your veins.  You will be given a medicine that makes you fall asleep (general anesthetic).  Your surgeon may mark your breast to indicate the location of your tumor and to plan the incision.  Your breast will be cleaned with a germ-killing solution (antiseptic).  Your surgeon will make an incision over the  area of your breast where the tumor is located. This will usually be a curved incision that follows the normal shape of your breast.  The tumor will be removed along with a portion of the tissue that surrounds it.  If the tumor is close to the muscles over your chest, some muscle tissue may also be removed.  The incision may extend to the lymph nodes under your arm, or a second incision may be made under your arm.  Lymph nodes under your arm may be removed.  You may have a  drainage tube inserted into your incision to collect fluid that builds up after surgery. This tube will be connected to a suction bulb.  Your incision or incisions will be closed with stitches (sutures).  A bandage (dressing) will be placed over your breast and under your arm. The procedure may vary among health care providers and hospitals. What happens after the procedure?  Your blood pressure, heart rate, breathing rate, and blood oxygen level will be monitored often until the medicines you were given have worn off.  You will be given pain medicine as needed.  You will be encouraged to get up and walk as soon as you can.  Your IV tube will be removed when you are able to eat and drink.  Your drain may be removed before you go home from the hospital, or you may be sent home with your drain and suction bulb. This information is not intended to replace advice given to you by your health care provider. Make sure you discuss any questions you have with your health care provider. Document Released: 03/11/2015 Document Revised: 07/01/2016 Document Reviewed: 07/10/2014 Elsevier Interactive Patient Education  2018 [place]. Partial Mastectomy With or Without Axillary Lymph Node Removal, Care After Refer to this sheet in the next few weeks. These instructions provide you with information about caring for yourself after your procedure. Your health care provider may also give you more specific instructions. Your treatment has been planned according to current medical practices, but problems sometimes occur. Call your health care provider if you have any problems or questions after your procedure. What can I expect after the procedure? After your procedure, it is common to have:  Breast swelling.  Breast tenderness.  Stiffness in your arm or shoulder.  A change in the shape and feel of your breast.  Follow these instructions at home: Bathing  Take sponge baths until your health care  provider says that you can start showering or bathing.  Do not take baths, swim, or use a hot tub until your health care provider approves. Incision care  There are many different ways to close and cover an incision, including stitches, skin glue, and adhesive strips. Follow your health care provider's instructions about: ? Incision care. ? Bandage (dressing) changes and removal. ? Incision closure removal.  Check your incision area every day for signs of infection. Watch for: ? Redness, swelling, or pain. ? Fluid, blood, or pus.  If you were sent home with a surgical drain in place, follow your health care provider's instructions for emptying it. Activity  Return to your normal activities as directed by your health care provider.  Avoid strenuous exercise.  Be careful to avoid any activities that could cause an injury to your arm on the side of your surgery.  Do not lift anything that is heavier than 10 lb (4.5 kg). Avoid lifting with the arm that is on the side of your surgery.  Do  not carry heavy objects on your shoulder.  After your drain is removed, you should perform exercises to keep your arm from getting stiff and swollen. Talk with your health care provider about which exercises are safe for you. General instructions  Take medicines only as directed by your health care provider.  Keep your dressing clean and dry.  You may eat what you usually do.  Wear a supportive bra as directed by your health care provider.  Keep your arm elevated when at rest.  Do not wear tight jewelry on your arm, wrist, or fingers on the side of your surgery.  Get checked for extra fluid around your lymph nodes (lymphedema) as often as told by your health care provider.  If you had any lymph nodes removed during your procedure, be sure to tell all of your health care providers. This is important information to share before you are involved in certain procedures, such as giving blood or  having your blood pressure taken. Contact a health care provider if:  You have a fever.  Your pain medicine is not working.  Your swelling, weakness, or numbness in your arm has not improved after a few weeks.  You have new swelling in your breast or arm.  You have redness, swelling, or pain in your incision area.  You have fluid, blood, or pus coming from your incision. Get help right away if:  You have very bad pain in your breast or arm.  You have chest pain.  You have difficulty breathing. This information is not intended to replace advice given to you by your health care provider. Make sure you discuss any questions you have with your health care provider. Document Released: 06/08/2004 Document Revised: 07/01/2016 Document Reviewed: 07/10/2014 Elsevier Interactive Patient Education  2018 [place] Anesthesia, Adult General anesthesia is the use of medicines to make a person "go to sleep" (be unconscious) for a medical procedure. General anesthesia is often recommended when a procedure:  Is long.  Requires you to be still or in an unusual position.  Is major and can cause you to lose blood.  Is impossible to do without general anesthesia.  The medicines used for general anesthesia are called general anesthetics. In addition to making you sleep, the medicines:  Prevent pain.  Control your blood pressure.  Relax your muscles.  Tell a health care provider about:  Any allergies you have.  All medicines you are taking, including vitamins, herbs, eye drops, creams, and over-the-counter medicines.  Any problems you or family members have had with anesthetic medicines.  Types of anesthetics you have had in the past.  Any bleeding disorders you have.  Any surgeries you have had.  Any medical conditions you have.  Any history of heart or lung conditions, such as heart failure, sleep apnea, or chronic obstructive pulmonary disease (COPD).  Whether  you are pregnant or may be pregnant.  Whether you use tobacco, alcohol, marijuana, or street drugs.  Any history of Armed forces logistics/support/administrative officer.  Any history of depression or anxiety. What are the risks? Generally, this is a safe procedure. However, problems may occur, including:  Allergic reaction to anesthetics.  Lung and heart problems.  Inhaling food or liquids from your stomach into your lungs (aspiration).  Injury to nerves.  Waking up during your procedure and being unable to move (rare).  Extreme agitation or a state of mental confusion (delirium) when you wake up from the anesthetic.  Air in the bloodstream, which can  lead to stroke.  These problems are more likely to develop if you are having a major surgery or if you have an advanced medical condition. You can prevent some of these complications by answering all of your health care provider's questions thoroughly and by following all pre-procedure instructions. General anesthesia can cause side effects, including:  Nausea or vomiting  A sore throat from the breathing tube.  Feeling cold or shivery.  Feeling tired, washed out, or achy.  Sleepiness or drowsiness.  Confusion or agitation.  What happens before the procedure? Staying hydrated Follow instructions from your health care provider about hydration, which may include:  Up to 2 hours before the procedure - you may continue to drink clear liquids, such as water, clear fruit juice, black coffee, and plain tea.  Eating and drinking restrictions Follow instructions from your health care provider about eating and drinking, which may include:  8 hours before the procedure - stop eating heavy meals or foods such as meat, fried foods, or fatty foods.  6 hours before the procedure - stop eating light meals or foods, such as toast or cereal.  6 hours before the procedure - stop drinking milk or drinks that contain milk.  2 hours before the procedure - stop drinking  clear liquids.  Medicines  Ask your health care provider about: ? Changing or stopping your regular medicines. This is especially important if you are taking diabetes medicines or blood thinners. ? Taking medicines such as aspirin and ibuprofen. These medicines can thin your blood. Do not take these medicines before your procedure if your health care provider instructs you not to. ? Taking new dietary supplements or medicines. Do not take these during the week before your procedure unless your health care provider approves them.  If you are told to take a medicine or to continue taking a medicine on the day of the procedure, take the medicine with sips of water. General instructions   Ask if you will be going home the same day, the following day, or after a longer hospital stay. ? Plan to have someone take you home. ? Plan to have someone stay with you for the first 24 hours after you leave the hospital or clinic.  For 3-6 weeks before the procedure, try not to use any tobacco products, such as cigarettes, chewing tobacco, and e-cigarettes.  You may brush your teeth on the morning of the procedure, but make sure to spit out the toothpaste. What happens during the procedure?  You will be given anesthetics through a mask and through an IV tube in one of your veins.  You may receive medicine to help you relax (sedative).  As soon as you are asleep, a breathing tube may be used to help you breathe.  An anesthesia specialist will stay with you throughout the procedure. He or she will help keep you comfortable and safe by continuing to give you medicines and adjusting the amount of medicine that you get. He or she will also watch your blood pressure, pulse, and oxygen levels to make sure that the anesthetics do not cause any problems.  If a breathing tube was used to help you breathe, it will be removed before you wake up. The procedure may vary among health care providers and  hospitals. What happens after the procedure?  You will wake up, often slowly, after the procedure is complete, usually in a recovery area.  Your blood pressure, heart rate, breathing rate, and blood oxygen level will  be monitored until the medicines you were given have worn off.  You may be given medicine to help you calm down if you feel anxious or agitated.  If you will be going home the same day, your health care provider may check to make sure you can stand, drink, and urinate.  Your health care providers will treat your pain and side effects before you go home.  Do not drive for 24 hours if you received a sedative.  You may: ? Feel nauseous and vomit. ? Have a sore throat. ? Have mental slowness. ? Feel cold or shivery. ? Feel sleepy. ? Feel tired. ? Feel sore or achy, even in parts of your body where you did not have surgery. This information is not intended to replace advice given to you by your health care provider. Make sure you discuss any questions you have with your health care provider. Document Released: 02/01/2008 Document Revised: 04/06/2016 Document Reviewed: 10/09/2015 Elsevier Interactive Patient Education  2018 [place] Anesthesia, Adult, Care After These instructions provide you with information about caring for yourself after your procedure. Your health care provider may also give you more specific instructions. Your treatment has been planned according to current medical practices, but problems sometimes occur. Call your health care provider if you have any problems or questions after your procedure. What can I expect after the procedure? After the procedure, it is common to have:  Vomiting.  A sore throat.  Mental slowness.  It is common to feel:  Nauseous.  Cold or shivery.  Sleepy.  Tired.  Sore or achy, even in parts of your body where you did not have surgery.  Follow these instructions at home: For at least 24 hours after  the procedure:  Do not: ? Participate in activities where you could fall or become injured. ? Drive. ? Use heavy machinery. ? Drink alcohol. ? Take sleeping pills or medicines that cause drowsiness. ? Make important decisions or sign legal documents. ? Take care of children on your own.  Rest. Eating and drinking  If you vomit, drink water, juice, or soup when you can drink without vomiting.  Drink enough fluid to keep your urine clear or pale yellow.  Make sure you have little or no nausea before eating solid foods.  Follow the diet recommended by your health care provider. General instructions  Have a responsible adult stay with you until you are awake and alert.  Return to your normal activities as told by your health care provider. Ask your health care provider what activities are safe for you.  Take over-the-counter and prescription medicines only as told by your health care provider.  If you smoke, do not smoke without supervision.  Keep all follow-up visits as told by your health care provider. This is important. Contact a health care provider if:  You continue to have nausea or vomiting at home, and medicines are not helpful.  You cannot drink fluids or start eating again.  You cannot urinate after 8-12 hours.  You develop a skin rash.  You have fever.  You have increasing redness at the site of your procedure. Get help right away if:  You have difficulty breathing.  You have chest pain.  You have unexpected bleeding.  You feel that you are having a life-threatening or urgent problem. This information is not intended to replace advice given to you by your health care provider. Make sure you discuss any questions you have with your health care  provider. Document Released: 01/31/2001 Document Revised: 03/29/2016 Document Reviewed: 10/09/2015 Elsevier Interactive Patient Education  [employer].

## 2017-12-28 ENCOUNTER — Encounter (HOSPITAL_COMMUNITY)
Admission: RE | Admit: 2017-12-28 | Discharge: 2017-12-28 | Disposition: A | Discharge status: Home / Self Care | Payer: Medicaid Other | Source: Ambulatory Visit | Attending: General Surgery | Admitting: General Surgery

## 2017-12-28 ENCOUNTER — Ambulatory Visit (HOSPITAL_COMMUNITY)
Admission: RE | Admit: 2017-12-28 | Discharge: 2017-12-28 | Disposition: A | Discharge status: Home / Self Care | Payer: Medicaid Other | Source: Ambulatory Visit | Attending: General Surgery | Admitting: General Surgery

## 2017-12-28 ENCOUNTER — Other Ambulatory Visit: Payer: Self-pay

## 2017-12-28 ENCOUNTER — Encounter (HOSPITAL_COMMUNITY): Payer: Self-pay

## 2017-12-28 ENCOUNTER — Ambulatory Visit (HOSPITAL_COMMUNITY): Payer: Medicaid Other

## 2017-12-28 DIAGNOSIS — Z01818 Encounter for other preprocedural examination: Secondary | ICD-10-CM | POA: Insufficient documentation

## 2017-12-28 DIAGNOSIS — Z01812 Encounter for preprocedural laboratory examination: Secondary | ICD-10-CM | POA: Diagnosis present

## 2017-12-28 DIAGNOSIS — Z01810 Encounter for preprocedural cardiovascular examination: Secondary | ICD-10-CM | POA: Diagnosis present

## 2017-12-28 HISTORY — DX: Gout, unspecified: M10.9

## 2017-12-28 HISTORY — DX: Personal history of urinary calculi: Z87.442

## 2017-12-28 LAB — COMPREHENSIVE METABOLIC PANEL
ALT: 21 U/L (ref 14–54)
AST: 28 U/L (ref 15–41)
Albumin: 3.7 g/dL (ref 3.5–5.0)
Alkaline Phosphatase: 128 U/L — ABNORMAL HIGH (ref 38–126)
Anion gap: 9 (ref 5–15)
BUN: 29 mg/dL — ABNORMAL HIGH (ref 6–20)
CO2: 22 mmol/L (ref 22–32)
Calcium: 9.4 mg/dL (ref 8.9–10.3)
Chloride: 108 mmol/L (ref 101–111)
Creatinine, Ser: 1.13 mg/dL — ABNORMAL HIGH (ref 0.44–1.00)
GFR calc Af Amer: 58 mL/min — ABNORMAL LOW (ref >60)
GFR calc non Af Amer: 50 mL/min — ABNORMAL LOW (ref >60)
Glucose, Bld: 105 mg/dL — ABNORMAL HIGH (ref 65–99)
Potassium: 3.3 mmol/L — ABNORMAL LOW (ref 3.5–5.1)
Sodium: 139 mmol/L (ref 135–145)
Total Bilirubin: 0.7 mg/dL (ref 0.3–1.2)
Total Protein: 7.1 g/dL (ref 6.5–8.1)

## 2017-12-28 LAB — CBC WITH DIFFERENTIAL/PLATELET
Basophils Absolute: 0 10*3/uL (ref 0.0–0.1)
Basophils Relative: 0 %
Eosinophils Absolute: 0.1 10*3/uL (ref 0.0–0.7)
Eosinophils Relative: 1 %
HCT: 33.3 % — ABNORMAL LOW (ref 36.0–46.0)
Hemoglobin: 10.5 g/dL — ABNORMAL LOW (ref 12.0–15.0)
Lymphocytes Relative: 23 %
Lymphs Abs: 1.1 10*3/uL (ref 0.7–4.0)
MCH: 29.7 pg (ref 26.0–34.0)
MCHC: 31.5 g/dL (ref 30.0–36.0)
MCV: 94.3 fL (ref 78.0–100.0)
Monocytes Absolute: 0.4 10*3/uL (ref 0.1–1.0)
Monocytes Relative: 8 %
Neutro Abs: 3.2 10*3/uL (ref 1.7–7.7)
Neutrophils Relative %: 68 %
Platelets: 147 10*3/uL — ABNORMAL LOW (ref 150–400)
RBC: 3.53 MIL/uL — ABNORMAL LOW (ref 3.87–5.11)
RDW: 14.1 % (ref 11.5–15.5)
WBC: 4.8 10*3/uL (ref 4.0–10.5)

## 2017-12-28 NOTE — Progress Notes (Signed)
   12/28/17 1050  OBSTRUCTIVE SLEEP APNEA  Have you ever been diagnosed with sleep apnea through a sleep study? No  Do you snore loudly (loud enough to be heard through closed doors)?  1  Do you often feel tired, fatigued, or sleepy during the daytime (such as falling asleep during driving or talking to someone)? 1  Has anyone observed you stop breathing during your sleep? 0  Do you have, or are you being treated for high blood pressure? 1  BMI more than 35 kg/m2? 0  Age > 50 (1-yes) 1  Neck circumference greater than:Female 16 inches or larger, Female 17inches or larger? 0  Female Gender (Yes=1) 0  Obstructive Sleep Apnea Score 4  Score 5 or greater  Results sent to PCP

## 2017-12-29 NOTE — Pre-Procedure Instructions (Signed)
Dr Patsey Berthold aware of H&H and potassium. No orders given.

## 2018-01-02 ENCOUNTER — Other Ambulatory Visit: Payer: Self-pay

## 2018-01-02 ENCOUNTER — Emergency Department (HOSPITAL_COMMUNITY)
Admission: EM | Admit: 2018-01-02 | Discharge: 2018-01-02 | Disposition: A | Discharge status: Home / Self Care | Payer: Medicaid Other | Attending: Emergency Medicine | Admitting: Emergency Medicine

## 2018-01-02 ENCOUNTER — Emergency Department (HOSPITAL_COMMUNITY): Payer: Medicaid Other

## 2018-01-02 ENCOUNTER — Encounter (HOSPITAL_COMMUNITY): Payer: Self-pay | Admitting: Emergency Medicine

## 2018-01-02 DIAGNOSIS — I5032 Chronic diastolic (congestive) heart failure: Secondary | ICD-10-CM | POA: Diagnosis not present

## 2018-01-02 DIAGNOSIS — M79602 Pain in left arm: Secondary | ICD-10-CM | POA: Diagnosis present

## 2018-01-02 DIAGNOSIS — Z79899 Other long term (current) drug therapy: Secondary | ICD-10-CM | POA: Insufficient documentation

## 2018-01-02 DIAGNOSIS — Z87891 Personal history of nicotine dependence: Secondary | ICD-10-CM | POA: Diagnosis not present

## 2018-01-02 DIAGNOSIS — N182 Chronic kidney disease, stage 2 (mild): Secondary | ICD-10-CM | POA: Insufficient documentation

## 2018-01-02 DIAGNOSIS — Z7982 Long term (current) use of aspirin: Secondary | ICD-10-CM | POA: Insufficient documentation

## 2018-01-02 DIAGNOSIS — I130 Hypertensive heart and chronic kidney disease with heart failure and stage 1 through stage 4 chronic kidney disease, or unspecified chronic kidney disease: Secondary | ICD-10-CM | POA: Diagnosis not present

## 2018-01-02 LAB — CBC WITH DIFFERENTIAL/PLATELET
Basophils Absolute: 0 10*3/uL (ref 0.0–0.1)
Basophils Relative: 0 %
Eosinophils Absolute: 0.1 10*3/uL (ref 0.0–0.7)
Eosinophils Relative: 2 %
HCT: 35.7 % — ABNORMAL LOW (ref 36.0–46.0)
Hemoglobin: 11.3 g/dL — ABNORMAL LOW (ref 12.0–15.0)
Lymphocytes Relative: 31 %
Lymphs Abs: 1.4 10*3/uL (ref 0.7–4.0)
MCH: 29.7 pg (ref 26.0–34.0)
MCHC: 31.7 g/dL (ref 30.0–36.0)
MCV: 93.9 fL (ref 78.0–100.0)
Monocytes Absolute: 0.4 10*3/uL (ref 0.1–1.0)
Monocytes Relative: 10 %
Neutro Abs: 2.6 10*3/uL (ref 1.7–7.7)
Neutrophils Relative %: 57 %
Platelets: 136 10*3/uL — ABNORMAL LOW (ref 150–400)
RBC: 3.8 MIL/uL — ABNORMAL LOW (ref 3.87–5.11)
RDW: 14 % (ref 11.5–15.5)
WBC: 4.5 10*3/uL (ref 4.0–10.5)

## 2018-01-02 LAB — COMPREHENSIVE METABOLIC PANEL
ALT: 17 U/L (ref 14–54)
AST: 23 U/L (ref 15–41)
Albumin: 4 g/dL (ref 3.5–5.0)
Alkaline Phosphatase: 152 U/L — ABNORMAL HIGH (ref 38–126)
Anion gap: 11 (ref 5–15)
BUN: 37 mg/dL — ABNORMAL HIGH (ref 6–20)
CO2: 26 mmol/L (ref 22–32)
Calcium: 9.6 mg/dL (ref 8.9–10.3)
Chloride: 100 mmol/L — ABNORMAL LOW (ref 101–111)
Creatinine, Ser: 1.37 mg/dL — ABNORMAL HIGH (ref 0.44–1.00)
GFR calc Af Amer: 46 mL/min — ABNORMAL LOW (ref >60)
GFR calc non Af Amer: 40 mL/min — ABNORMAL LOW (ref >60)
Glucose, Bld: 88 mg/dL (ref 65–99)
Potassium: 3.4 mmol/L — ABNORMAL LOW (ref 3.5–5.1)
Sodium: 137 mmol/L (ref 135–145)
Total Bilirubin: 0.4 mg/dL (ref 0.3–1.2)
Total Protein: 7.6 g/dL (ref 6.5–8.1)

## 2018-01-02 LAB — TROPONIN I
Troponin I: 0.03 ng/mL (ref <0.03)
Troponin I: 0.03 ng/mL (ref <0.03)

## 2018-01-02 MED ORDER — ONDANSETRON 4 MG PO TBDP
4.0000 mg | ORAL_TABLET | Freq: Once | ORAL | Status: AC
  Administered 2018-01-02: 4 mg via ORAL
  Filled 2018-01-02: qty 1

## 2018-01-02 MED ORDER — HYDROCODONE-ACETAMINOPHEN 5-325 MG PO TABS: 1.0000 | ORAL_TABLET | Freq: Four times a day (QID) | ORAL | 0 refills | Status: DC | PRN

## 2018-01-02 MED ORDER — TRAMADOL HCL 50 MG PO TABS
50.0000 mg | ORAL_TABLET | Freq: Once | ORAL | Status: AC
  Administered 2018-01-02: 50 mg via ORAL
  Filled 2018-01-02: qty 1

## 2018-01-02 NOTE — ED Provider Notes (Signed)
[facility] EMERGENCY DEPARTMENT Provider Note   CSN: [id number] Arrival date & time: 01/02/18  0732     History   Chief Complaint Chief Complaint  Patient presents with  . Arm Pain    HPI Toni [name] is a 62 y.o. female.  Patient complaint of pain and burning down her left arm been going on and off.  The pain is more distally.   The history is provided by the patient.  Arm Pain  This is a new problem. The current episode started more than 1 week ago. The problem occurs rarely. Pertinent negatives include no chest pain, no abdominal pain and no headaches. Nothing aggravates the symptoms. Nothing relieves the symptoms. She has tried nothing for the symptoms.    Past Medical History:  Diagnosis Date  . Congestive heart failure, unspecified    Diastolic heart failure  . Degeneration of lumbar or lumbosacral intervertebral disc   . Depression   . Diverticulosis   . Dyspnea     chronic multifactorial large component of deconditioning  . Esophageal reflux   . Gout   . History of kidney stones   . Insomnia    Working third shift  . Mitral stenosis    Secondary to under-sized ring annuloplasty following mitral valve repair  . Mitral valve disorders(424.0)    Mild to moderate mitral regurgitation  . Mitral valve insufficiency and aortic valve insufficiency   . Morbid obesity ([place])   . Normocytic anemia   . Other chest pain   . Other dyspnea and respiratory abnormality   . Paroxysmal atrial fibrillation ([name])    Stable no recurrence  . Peripheral edema    chronic  . S/P Maze operation for atrial fibrillation 02/27/2015  . S/P mitral valve repair 02/27/2015   "radical repair" including resection of P2 segment of posterior leaflet with 26 mm Edwards Physio II annuloplasty ring  . S/P tricuspid valve repair 02/27/2015   26 mm annuloplasty ring  . Unspecified essential hypertension     Patient Active Problem List   Diagnosis Date Noted  . Upper airway cough syndrome  11/24/2017  . Esophageal reflux 11/10/2017  . Neuropathic pain 09/16/2017  . Anemia of unknown etiology 08/17/2017  . Anxiety 08/17/2017  . Globus sensation 05/10/2017  . PNA (pneumonia) 04/18/2017  . CAP (community acquired pneumonia) 04/17/2017  . Elevated troponin 04/17/2017  . Mitral stenosis   . Dysphagia 10/20/2016  . Constipation 10/20/2016  . S/P mitral valve repair 02/27/2015  . S/P Maze operation for atrial fibrillation 02/27/2015  . S/P tricuspid valve repair 02/27/2015  . Chronic kidney disease, stage II (mild)   . Insomnia 09/02/2011  . Chronic back pain 09/02/2011  . EDEMA 01/18/2011  . HYPOKALEMIA 09/17/2010  . ANEMIA 09/11/2010  . Chronic diastolic heart failure ([place]) 08/28/2010  . OTH NONSPECIFIC ABNORM CV SYSTEM FUNCTION STUDY 08/11/2010  . PRECORDIAL PAIN 07/30/2010  . DOE (dyspnea on exertion) 12/26/2009  . ACUTE ON CHRONIC DIASTOLIC HEART FAILURE 09/62/8366  . MITRAL REGURGITATION 07/07/2009  . Essential hypertension 07/07/2009  . ATRIAL FIBRILLATION 07/07/2009    Past Surgical History:  Procedure Laterality Date  . APPENDECTOMY    . BREAST BIOPSY Left 2009  . CARDIAC VALVE SURGERY  02/27/2015   Mikel Cella. Mitral Valve Repair. Radical Repair with resection of P2 Prolapse, 26 mm Physio II Ring Annuloplasty, Tricuspid Valve Repair (26 mm Ring Annuloplasty, Cox Maze IV Procedure on 02/27/2015  . CHOLECYSTECTOMY    . COLONOSCOPY  2011  Dr. Laural Golden: 60mm polyp from hepatic flexure (tubular adenoma)  . COLONOSCOPY N/A 11/10/2016   Dr. Gala Romney: Diverticulosis. Next colonoscopy 7 years, January 2025  . ESOPHAGOGASTRODUODENOSCOPY  2005    Dr. Gala Romney : normal esophagus s/p dilation. Small hiatal hernia noted. Felt improved after dilation.   . ESOPHAGOGASTRODUODENOSCOPY N/A 11/10/2016   Dr. Gala Romney: Moderate-sized hiatal hernia, esophagus normal status post empiric dilation for history of dysphagia  . HEEL SPUR SURGERY Right   . MALONEY DILATION N/A 11/10/2016   Procedure:  Venia Minks DILATION;  Surgeon: Daneil Dolin, MD;  Location: [facility] ENDO SUITE;  Service: Endoscopy;  Laterality: N/A;  . MINIMALLY INVASIVE TRICUSPID VALVE REPAIR  02/27/2015   [place]  . MITRAL VALVE REPAIR  02/27/2015   [place]  . TEE WITHOUT CARDIOVERSION N/A 10/18/2016   Procedure: TRANSESOPHAGEAL ECHOCARDIOGRAM (TEE);  Surgeon: Arnoldo Lenis, MD;  Location: [facility] ENDO SUITE;  Service: Endoscopy;  Laterality: N/A;  . TOTAL ABDOMINAL HYSTERECTOMY    . TOTAL KNEE ARTHROPLASTY Right   . TUBAL LIGATION      OB History    No data available       Home Medications    Prior to Admission medications   Medication Sig Start Date End Date Taking? Authorizing Provider  acetaminophen (TYLENOL) 500 MG tablet Take 1,000 mg by mouth daily as needed for moderate pain or headache.   Yes [provider]  albuterol (PROVENTIL HFA;VENTOLIN HFA) 108 (90 Base) MCG/ACT inhaler Inhale 2 puffs into the lungs every 6 (six) hours as needed for wheezing or shortness of breath. 06/29/17  Yes Soyla Dryer, PA-C  amLODipine (NORVASC) 5 MG tablet Take 1 tablet (5 mg total) by mouth daily. 06/17/17  Yes Lendon Colonel, NP  aspirin EC 81 MG tablet Take 1 tablet (81 mg total) by mouth daily. 11/15/17  Yes Strader, [place] M, PA-C  cyclobenzaprine (FLEXERIL) 10 MG tablet Take 1 tablet (10 mg total) by mouth 3 (three) times daily as needed for muscle spasms. 10/25/17  Yes Hagler, Apolonio Schneiders, MD  FLUoxetine (PROZAC) 40 MG capsule TAKE 1 CAPSULE BY MOUTH EVERY DAY 12/26/17  Yes Hagler, Apolonio Schneiders, MD  gabapentin (NEURONTIN) 300 MG capsule Take one tablet in the morning and three tablets in the evening as directed. 09/16/17  Yes Hagler, Apolonio Schneiders, MD  ibuprofen (ADVIL,MOTRIN) 200 MG tablet Take 800 mg by mouth daily as needed for headache or moderate pain.   Yes [provider]  indomethacin (INDOCIN) 25 MG capsule 1 po q 8 hour prn gout pain 10/25/17  Yes  Caren Macadam, MD  linaclotide [facility]) 72 MCG capsule Take 1 capsule (72 mcg total) by mouth daily before breakfast. 11/10/17  Yes Mahala Menghini, PA-C  Liniments (SALONPAS PAIN RELIEF PATCH EX) Apply 1 patch topically daily as needed (pain).   Yes [provider]  losartan (COZAAR) 100 MG tablet Take 1 tablet (100 mg total) by mouth daily. 11/30/17  Yes Tanda Rockers, MD  magnesium gluconate (MAGONATE) 500 MG tablet Take 500 mg by mouth daily.   Yes [provider]  metoprolol tartrate (LOPRESSOR) 50 MG tablet Take 1 1/2 tablet by mouth twice a day 12/20/17  Yes Hagler, Apolonio Schneiders, MD  omeprazole (PRILOSEC) 40 MG capsule Take 1 capsule (40 mg total) by mouth daily. 10/25/17  Yes Caren Macadam, MD  torsemide (DEMADEX) 20 MG tablet Take 1 tablet (20 mg total) by mouth daily. 06/17/17  Yes Lendon Colonel, NP  trolamine salicylate (ASPERCREME)  10 % cream Apply 1 application topically as needed for muscle pain.   Yes [provider]  doxycycline (VIBRA-TABS) 100 MG tablet Take 1 tablet (100 mg total) by mouth 2 (two) times daily. Patient not taking: Reported on 01/02/2018 12/20/17   Caren Macadam, MD  guaiFENesin (MUCINEX) 600 MG 12 hr tablet Take 600 mg by mouth 2 (two) times daily as needed (congestion).    [provider]  HYDROcodone-acetaminophen (NORCO/VICODIN) 5-325 MG tablet Take 1 tablet by mouth every 6 (six) hours as needed for moderate pain. 01/02/18   Milton Ferguson, MD    Family History Family History  Problem Relation Age of Onset  . Hypertension Mother   . Heart disease Mother   . Kidney disease Father   . Stroke Father   . Diabetes Sister   . Kidney disease Sister   . Heart disease Sister   . Hypertension Sister   . Heart disease Brother   . Heart attack Son   . Hypertension Son   . Cancer Paternal Grandmother        Breast Cancer  . Heart disease Brother   . Colon cancer Neg Hx     Social History Social History   Tobacco Use  .  Smoking status: Former Smoker    Packs/day: 0.50    Years: 25.00    Pack years: 12.50    Types: Cigarettes    Last attempt to quit: 1997    Years since quitting: 22.1  . Smokeless tobacco: Former Systems developer    Types: Chew    Quit date: 70  . Tobacco comment: dipped tobacco during pregancy in 1975  Substance Use Topics  . Alcohol use: No  . Drug use: No     Allergies   Iodinated diagnostic agents   Review of Systems Review of Systems  Constitutional: Negative for appetite change and fatigue.  HENT: Negative for congestion, ear discharge and sinus pressure.   Eyes: Negative for discharge.  Respiratory: Negative for cough.   Cardiovascular: Negative for chest pain.  Gastrointestinal: Negative for abdominal pain and diarrhea.  Genitourinary: Negative for frequency and hematuria.  Musculoskeletal: Negative for back pain.  Skin: Negative for rash.  Neurological: Negative for seizures and headaches.       Left arm pain  Psychiatric/Behavioral: Negative for hallucinations.     Physical Exam Updated Vital Signs BP 139/65 (BP Location: Right Arm)   Pulse 64   Temp 97.7 F (36.5 C) (Oral)   Resp 16   Ht 5\' 7"  (1.702 m)   Wt 103 kg (227 lb)   LMP  (LMP Unknown)   SpO2 94%   BMI 35.55 kg/m   Physical Exam  Constitutional: She is oriented to person, place, and time. She appears well-developed.  HENT:  Head: Normocephalic.  Eyes: Conjunctivae and EOM are normal. No scleral icterus.  Neck: Neck supple. No thyromegaly present.  Cardiovascular: Normal rate and regular rhythm. Exam reveals no gallop and no friction rub.  No murmur heard. Pulmonary/Chest: No stridor. She has no wheezes. She has no rales. She exhibits no tenderness.  Abdominal: She exhibits no distension. There is no tenderness. There is no rebound.  Musculoskeletal: Normal range of motion. She exhibits no edema.  Lymphadenopathy:    She has no cervical adenopathy.  Neurological: She is oriented to person,  place, and time. She exhibits normal muscle tone. Coordination normal.  Skin: No rash noted. No erythema.  Psychiatric: She has a normal mood and affect. Her behavior  is normal.  Nursing note and vitals reviewed.    ED Treatments / Results  Labs (all labs ordered are listed, but only abnormal results are displayed) Labs Reviewed  CBC WITH DIFFERENTIAL/PLATELET - Abnormal; Notable for the following components:      Result Value   RBC 3.80 (*)    Hemoglobin 11.3 (*)    HCT 35.7 (*)    Platelets 136 (*)    All other components within normal limits  COMPREHENSIVE METABOLIC PANEL - Abnormal; Notable for the following components:   Potassium 3.4 (*)    Chloride 100 (*)    BUN 37 (*)    Creatinine, Ser 1.37 (*)    Alkaline Phosphatase 152 (*)    GFR calc non Af Amer 40 (*)    GFR calc Af Amer 46 (*)    All other components within normal limits  TROPONIN I - Abnormal; Notable for the following components:   Troponin I 0.03 (*)    All other components within normal limits  TROPONIN I - Abnormal; Notable for the following components:   Troponin I 0.03 (*)    All other components within normal limits    EKG  EKG Interpretation  Date/Time:  Monday January 02 2018 08:19:03 EST Ventricular Rate:  70 PR Interval:    QRS Duration: 100 QT Interval:  484 QTC Calculation: 523 R Axis:   67 Text Interpretation:  Sinus rhythm Short PR interval Borderline repolarization abnormality Prolonged QT interval Confirmed by Milton Ferguson [phone]) on 01/02/2018 2:18:16 PM       Radiology Dg Chest 2 View  Result Date: 01/02/2018 CLINICAL DATA:  Shortness of breath. Known left breast mass with surgery planned in 2 days. EXAM: CHEST  2 VIEW COMPARISON:  PA and lateral chest x-ray of December 28, 2017 FINDINGS: The lungs are well-expanded. The interstitial markings are increased. The pulmonary vascularity is engorged. The cardiac silhouette remains enlarged. The patient has undergone previous aortic  and mitral valve repair-replacement. The sternal wires are intact. There is calcification in the wall of the aortic arch. There is mild multilevel degenerative disc disease of the thoracic spine. IMPRESSION: CHF with mild pulmonary interstitial edema worse since the study of 5 days ago. No acute pneumonia. Thoracic aortic atherosclerosis. Electronically Signed   By: David  Martinique M.D.   On: 01/02/2018 08:45   Dg Cervical Spine 2-3 Views  Result Date: 01/02/2018 CLINICAL DATA:  Shooting pains in the left arm and vomiting since last night. No known injury. EXAM: CERVICAL SPINE - 2-3 VIEW COMPARISON:  None in PACs FINDINGS: The cervical vertebral bodies are preserved in height. There is congenital partial fusion across the C6-7 disc space. There is moderate disc space narrowing at C4-5 and milder narrowing at C5-6. There is mild multilevel facet joint hypertrophy. The spinous processes are intact. The odontoid is intact. The prevertebral soft tissue spaces are normal. IMPRESSION: Multilevel degenerative disc and facet joint change. No acute fracture or dislocation. Electronically Signed   By: David  Martinique M.D.   On: 01/02/2018 08:51    Procedures Procedures (including critical care time)  Medications Ordered in ED Medications  traMADol (ULTRAM) tablet 50 mg (50 mg Oral Given 01/02/18 0812)  ondansetron (ZOFRAN-ODT) disintegrating tablet 4 mg (4 mg Oral Given 01/02/18 2355)     Initial Impression / Assessment and Plan / ED Course  I have reviewed the triage vital signs and the nursing notes.  Pertinent labs & imaging results that were available during my care  of the patient were reviewed by me and considered in my medical decision making (see chart for details).     Labs reviewed.  Patient had 2 troponins that were 0.03 and seems stable.  Chest x-ray shows mild failure.  Patient has had failure but this is slightly worse on x-ray.  Suspect pain and arm is radicular pain or neuritis.  Patient is on  gabapentin and it does not seem to be helping she will be given some Vicodin and follow-up with a PCP next week  Final Clinical Impressions(s) / ED Diagnoses   Final diagnoses:  Left arm pain    ED Discharge Orders        Ordered    HYDROcodone-acetaminophen (NORCO/VICODIN) 5-325 MG tablet  Every 6 hours PRN     01/02/18 1418       Milton Ferguson, MD 01/02/18 1422

## 2018-01-02 NOTE — ED Notes (Signed)
Date and time results received: 01/02/18 0936 (use smartphrase ".now" to insert current time)  Test: trop Critical Value: 0.03  Name of Provider Notified: zammitt  Orders Received? Or Actions Taken?: none

## 2018-01-02 NOTE — ED Triage Notes (Signed)
Pt c/o LT arm, sharp shooting pain that began last night. States pain starts in her hand and radiates to neck. Denies injury.

## 2018-01-02 NOTE — Discharge Instructions (Signed)
Follow up with your family md  °

## 2018-01-04 ENCOUNTER — Ambulatory Visit (HOSPITAL_COMMUNITY)
Admission: RE | Admit: 2018-01-04 | Discharge: 2018-01-04 | Disposition: A | Discharge status: Home / Self Care | Payer: Medicaid Other | Source: Ambulatory Visit | Attending: General Surgery | Admitting: General Surgery

## 2018-01-04 ENCOUNTER — Ambulatory Visit (HOSPITAL_COMMUNITY): Payer: Medicaid Other | Admitting: Anesthesiology

## 2018-01-04 ENCOUNTER — Encounter (HOSPITAL_COMMUNITY)
Admission: RE | Disposition: A | Discharge status: Home / Self Care | Payer: Self-pay | Source: Ambulatory Visit | Attending: General Surgery

## 2018-01-04 ENCOUNTER — Other Ambulatory Visit: Payer: Self-pay | Admitting: General Surgery

## 2018-01-04 ENCOUNTER — Ambulatory Visit (HOSPITAL_COMMUNITY): Payer: Medicaid Other

## 2018-01-04 ENCOUNTER — Inpatient Hospital Stay (HOSPITAL_COMMUNITY)
Admission: RE | Admit: 2018-01-04 | Discharge: 2018-01-06 | DRG: 580 | Disposition: A | Discharge status: Home / Self Care | Payer: Medicaid Other | Source: Ambulatory Visit | Attending: General Surgery | Admitting: General Surgery

## 2018-01-04 ENCOUNTER — Other Ambulatory Visit: Payer: Self-pay

## 2018-01-04 ENCOUNTER — Encounter (HOSPITAL_COMMUNITY): Payer: Self-pay | Admitting: *Deleted

## 2018-01-04 DIAGNOSIS — N6321 Unspecified lump in the left breast, upper outer quadrant: Secondary | ICD-10-CM

## 2018-01-04 DIAGNOSIS — N6320 Unspecified lump in the left breast, unspecified quadrant: Secondary | ICD-10-CM

## 2018-01-04 DIAGNOSIS — Z8249 Family history of ischemic heart disease and other diseases of the circulatory system: Secondary | ICD-10-CM

## 2018-01-04 DIAGNOSIS — Z91041 Radiographic dye allergy status: Secondary | ICD-10-CM

## 2018-01-04 DIAGNOSIS — K219 Gastro-esophageal reflux disease without esophagitis: Secondary | ICD-10-CM | POA: Diagnosis present

## 2018-01-04 DIAGNOSIS — R591 Generalized enlarged lymph nodes: Secondary | ICD-10-CM

## 2018-01-04 DIAGNOSIS — R599 Enlarged lymph nodes, unspecified: Secondary | ICD-10-CM

## 2018-01-04 DIAGNOSIS — R590 Localized enlarged lymph nodes: Secondary | ICD-10-CM | POA: Diagnosis present

## 2018-01-04 DIAGNOSIS — Z87891 Personal history of nicotine dependence: Secondary | ICD-10-CM

## 2018-01-04 DIAGNOSIS — J811 Chronic pulmonary edema: Secondary | ICD-10-CM | POA: Diagnosis present

## 2018-01-04 DIAGNOSIS — Z90710 Acquired absence of both cervix and uterus: Secondary | ICD-10-CM

## 2018-01-04 DIAGNOSIS — Z96651 Presence of right artificial knee joint: Secondary | ICD-10-CM | POA: Diagnosis present

## 2018-01-04 DIAGNOSIS — I050 Rheumatic mitral stenosis: Secondary | ICD-10-CM | POA: Diagnosis present

## 2018-01-04 DIAGNOSIS — I110 Hypertensive heart disease with heart failure: Secondary | ICD-10-CM | POA: Diagnosis present

## 2018-01-04 DIAGNOSIS — Z833 Family history of diabetes mellitus: Secondary | ICD-10-CM

## 2018-01-04 DIAGNOSIS — I480 Paroxysmal atrial fibrillation: Secondary | ICD-10-CM | POA: Diagnosis present

## 2018-01-04 DIAGNOSIS — Z841 Family history of disorders of kidney and ureter: Secondary | ICD-10-CM

## 2018-01-04 DIAGNOSIS — Z803 Family history of malignant neoplasm of breast: Secondary | ICD-10-CM

## 2018-01-04 DIAGNOSIS — G4700 Insomnia, unspecified: Secondary | ICD-10-CM | POA: Diagnosis present

## 2018-01-04 DIAGNOSIS — Z6835 Body mass index (BMI) 35.0-35.9, adult: Secondary | ICD-10-CM

## 2018-01-04 DIAGNOSIS — R0902 Hypoxemia: Secondary | ICD-10-CM

## 2018-01-04 DIAGNOSIS — I509 Heart failure, unspecified: Secondary | ICD-10-CM

## 2018-01-04 DIAGNOSIS — Z823 Family history of stroke: Secondary | ICD-10-CM

## 2018-01-04 DIAGNOSIS — Z9049 Acquired absence of other specified parts of digestive tract: Secondary | ICD-10-CM

## 2018-01-04 DIAGNOSIS — I5032 Chronic diastolic (congestive) heart failure: Secondary | ICD-10-CM | POA: Diagnosis present

## 2018-01-04 DIAGNOSIS — N6012 Diffuse cystic mastopathy of left breast: Principal | ICD-10-CM | POA: Diagnosis present

## 2018-01-04 HISTORY — PX: PARTIAL MASTECTOMY WITH NEEDLE LOCALIZATION: SHX6008

## 2018-01-04 HISTORY — PX: AXILLARY LYMPH NODE DISSECTION: SHX5229

## 2018-01-04 SURGERY — PARTIAL MASTECTOMY WITH NEEDLE LOCALIZATION
Anesthesia: General | Site: Breast | Laterality: Left

## 2018-01-04 MED ORDER — BUPIVACAINE HCL 0.5 % IJ SOLN
INTRAMUSCULAR | Status: DC | PRN
  Administered 2018-01-04: 7 mL
  Administered 2018-01-04: 10 mL
  Administered 2018-01-04: 6 mL

## 2018-01-04 MED ORDER — MIDAZOLAM HCL 2 MG/2ML IJ SOLN
INTRAMUSCULAR | Status: AC
  Filled 2018-01-04: qty 2

## 2018-01-04 MED ORDER — OXYCODONE HCL 5 MG PO TABS
5.0000 mg | ORAL_TABLET | ORAL | Status: DC | PRN
  Administered 2018-01-04 – 2018-01-06 (×5): 5 mg via ORAL
  Filled 2018-01-04 (×5): qty 1

## 2018-01-04 MED ORDER — EPHEDRINE SULFATE 50 MG/ML IJ SOLN
INTRAMUSCULAR | Status: DC | PRN
  Administered 2018-01-04: 5 mg via INTRAVENOUS

## 2018-01-04 MED ORDER — MORPHINE SULFATE (PF) 2 MG/ML IV SOLN: 2.0000 mg | INTRAVENOUS | Status: DC | PRN

## 2018-01-04 MED ORDER — IPRATROPIUM-ALBUTEROL 0.5-2.5 (3) MG/3ML IN SOLN
RESPIRATORY_TRACT | Status: AC
Start: 2018-01-04 — End: 2018-01-04
  Filled 2018-01-04: qty 3

## 2018-01-04 MED ORDER — BUPIVACAINE HCL (PF) 0.5 % IJ SOLN
INTRAMUSCULAR | Status: AC
  Filled 2018-01-04: qty 30

## 2018-01-04 MED ORDER — HEMOSTATIC AGENTS (NO CHARGE) OPTIME
TOPICAL | Status: DC | PRN
  Administered 2018-01-04: 1 via TOPICAL

## 2018-01-04 MED ORDER — LACTATED RINGERS IV SOLN
INTRAVENOUS | Status: DC
  Administered 2018-01-04: 1000 mL via INTRAVENOUS

## 2018-01-04 MED ORDER — LIDOCAINE HCL (PF) 1 % IJ SOLN
INTRAMUSCULAR | Status: AC
  Filled 2018-01-04: qty 5

## 2018-01-04 MED ORDER — HYDROMORPHONE HCL 1 MG/ML IJ SOLN
0.5000 mg | Freq: Once | INTRAMUSCULAR | Status: AC
  Administered 2018-01-04: 0.5 mg via INTRAVENOUS

## 2018-01-04 MED ORDER — DIPHENHYDRAMINE HCL 50 MG/ML IJ SOLN: 12.5000 mg | Freq: Four times a day (QID) | INTRAMUSCULAR | Status: DC | PRN

## 2018-01-04 MED ORDER — IPRATROPIUM-ALBUTEROL 0.5-2.5 (3) MG/3ML IN SOLN
3.0000 mL | Freq: Four times a day (QID) | RESPIRATORY_TRACT | Status: DC
  Administered 2018-01-04: 3 mL via RESPIRATORY_TRACT

## 2018-01-04 MED ORDER — ONDANSETRON 4 MG PO TBDP
4.0000 mg | ORAL_TABLET | Freq: Once | ORAL | Status: AC
  Administered 2018-01-04: 4 mg via ORAL

## 2018-01-04 MED ORDER — CHLORHEXIDINE GLUCONATE CLOTH 2 % EX PADS: 6.0000 | MEDICATED_PAD | Freq: Once | CUTANEOUS | Status: DC

## 2018-01-04 MED ORDER — GLYCOPYRROLATE 0.2 MG/ML IJ SOLN
INTRAMUSCULAR | Status: DC | PRN
  Administered 2018-01-04: 0.2 mg via INTRAVENOUS

## 2018-01-04 MED ORDER — ONDANSETRON HCL 4 MG/2ML IJ SOLN
4.0000 mg | Freq: Four times a day (QID) | INTRAMUSCULAR | Status: DC | PRN
  Administered 2018-01-05: 4 mg via INTRAVENOUS
  Filled 2018-01-04: qty 2

## 2018-01-04 MED ORDER — ONDANSETRON 4 MG PO TBDP: 4.0000 mg | ORAL_TABLET | Freq: Four times a day (QID) | ORAL | Status: DC | PRN

## 2018-01-04 MED ORDER — LIDOCAINE HCL 1 % IJ SOLN
INTRAMUSCULAR | Status: DC | PRN
  Administered 2018-01-04: 20 mg via INTRADERMAL

## 2018-01-04 MED ORDER — ONDANSETRON 4 MG PO TBDP
ORAL_TABLET | ORAL | Status: AC
  Filled 2018-01-04: qty 1

## 2018-01-04 MED ORDER — METOPROLOL TARTRATE 5 MG/5ML IV SOLN: 5.0000 mg | Freq: Four times a day (QID) | INTRAVENOUS | Status: DC | PRN

## 2018-01-04 MED ORDER — FENTANYL CITRATE (PF) 100 MCG/2ML IJ SOLN: 25.0000 ug | INTRAMUSCULAR | Status: DC | PRN

## 2018-01-04 MED ORDER — SUCCINYLCHOLINE CHLORIDE 20 MG/ML IJ SOLN
INTRAMUSCULAR | Status: AC
  Filled 2018-01-04: qty 1

## 2018-01-04 MED ORDER — EPHEDRINE SULFATE 50 MG/ML IJ SOLN
INTRAMUSCULAR | Status: AC
  Filled 2018-01-04: qty 1

## 2018-01-04 MED ORDER — SUCCINYLCHOLINE CHLORIDE 20 MG/ML IJ SOLN
INTRAMUSCULAR | Status: DC | PRN
  Administered 2018-01-04: 60 mg via INTRAVENOUS

## 2018-01-04 MED ORDER — IBUPROFEN 800 MG PO TABS: 800.0000 mg | ORAL_TABLET | Freq: Every day | ORAL | Status: DC | PRN

## 2018-01-04 MED ORDER — METOPROLOL TARTRATE 50 MG PO TABS
75.0000 mg | ORAL_TABLET | Freq: Two times a day (BID) | ORAL | Status: DC
  Administered 2018-01-05 – 2018-01-06 (×3): 75 mg via ORAL
  Filled 2018-01-04 (×3): qty 1

## 2018-01-04 MED ORDER — CEFAZOLIN SODIUM-DEXTROSE 2-4 GM/100ML-% IV SOLN
2.0000 g | INTRAVENOUS | Status: AC
  Administered 2018-01-04: 2 g via INTRAVENOUS
  Filled 2018-01-04: qty 100

## 2018-01-04 MED ORDER — CYCLOBENZAPRINE HCL 10 MG PO TABS: 10.0000 mg | ORAL_TABLET | Freq: Three times a day (TID) | ORAL | Status: DC | PRN

## 2018-01-04 MED ORDER — 0.9 % SODIUM CHLORIDE (POUR BTL) OPTIME
TOPICAL | Status: DC | PRN
  Administered 2018-01-04 (×2): 1000 mL

## 2018-01-04 MED ORDER — PROPOFOL 10 MG/ML IV BOLUS
INTRAVENOUS | Status: AC
  Filled 2018-01-04: qty 20

## 2018-01-04 MED ORDER — FENTANYL CITRATE (PF) 100 MCG/2ML IJ SOLN
INTRAMUSCULAR | Status: DC | PRN
  Administered 2018-01-04: 50 ug via INTRAVENOUS
  Administered 2018-01-04: 25 ug via INTRAVENOUS
  Administered 2018-01-04: 50 ug via INTRAVENOUS
  Administered 2018-01-04: 25 ug via INTRAVENOUS

## 2018-01-04 MED ORDER — AMLODIPINE BESYLATE 5 MG PO TABS
5.0000 mg | ORAL_TABLET | Freq: Every day | ORAL | Status: DC
  Administered 2018-01-05 – 2018-01-06 (×2): 5 mg via ORAL
  Filled 2018-01-04 (×2): qty 1

## 2018-01-04 MED ORDER — MIDAZOLAM HCL 2 MG/2ML IJ SOLN
1.0000 mg | Freq: Once | INTRAMUSCULAR | Status: AC | PRN
  Administered 2018-01-04: 2 mg via INTRAVENOUS

## 2018-01-04 MED ORDER — SODIUM CHLORIDE 0.9 % IJ SOLN
INTRAMUSCULAR | Status: AC
  Filled 2018-01-04: qty 10

## 2018-01-04 MED ORDER — ALBUTEROL SULFATE HFA 108 (90 BASE) MCG/ACT IN AERS: 2.0000 | INHALATION_SPRAY | Freq: Four times a day (QID) | RESPIRATORY_TRACT | Status: DC | PRN

## 2018-01-04 MED ORDER — HYDROMORPHONE HCL 1 MG/ML IJ SOLN
INTRAMUSCULAR | Status: AC
  Filled 2018-01-04: qty 0.5

## 2018-01-04 MED ORDER — ACETAMINOPHEN 500 MG PO TABS
1000.0000 mg | ORAL_TABLET | Freq: Every day | ORAL | Status: DC | PRN
  Administered 2018-01-05: 1000 mg via ORAL
  Filled 2018-01-04 (×2): qty 2

## 2018-01-04 MED ORDER — FLUOXETINE HCL 20 MG PO CAPS
40.0000 mg | ORAL_CAPSULE | Freq: Every day | ORAL | Status: DC
  Administered 2018-01-04 – 2018-01-06 (×3): 40 mg via ORAL
  Filled 2018-01-04 (×3): qty 2

## 2018-01-04 MED ORDER — DIPHENHYDRAMINE HCL 12.5 MG/5ML PO ELIX: 12.5000 mg | ORAL_SOLUTION | Freq: Four times a day (QID) | ORAL | Status: DC | PRN

## 2018-01-04 MED ORDER — GABAPENTIN 300 MG PO CAPS
300.0000 mg | ORAL_CAPSULE | Freq: Three times a day (TID) | ORAL | Status: DC
  Administered 2018-01-04 – 2018-01-06 (×6): 300 mg via ORAL
  Filled 2018-01-04 (×6): qty 1

## 2018-01-04 MED ORDER — GLYCOPYRROLATE 0.2 MG/ML IJ SOLN
INTRAMUSCULAR | Status: AC
  Filled 2018-01-04: qty 1

## 2018-01-04 MED ORDER — MIDAZOLAM HCL 5 MG/5ML IJ SOLN
INTRAMUSCULAR | Status: DC | PRN
  Administered 2018-01-04 (×2): 1 mg via INTRAVENOUS

## 2018-01-04 MED ORDER — PROPOFOL 10 MG/ML IV BOLUS
INTRAVENOUS | Status: DC | PRN
  Administered 2018-01-04 (×2): 20 mg via INTRAVENOUS
  Administered 2018-01-04: 100 mg via INTRAVENOUS
  Administered 2018-01-04: 10 mg via INTRAVENOUS

## 2018-01-04 MED ORDER — FENTANYL CITRATE (PF) 100 MCG/2ML IJ SOLN
INTRAMUSCULAR | Status: AC
  Filled 2018-01-04: qty 4

## 2018-01-04 MED ORDER — ALBUTEROL SULFATE (2.5 MG/3ML) 0.083% IN NEBU: 2.5000 mg | INHALATION_SOLUTION | Freq: Four times a day (QID) | RESPIRATORY_TRACT | Status: DC | PRN

## 2018-01-04 MED ORDER — LOSARTAN POTASSIUM 50 MG PO TABS
100.0000 mg | ORAL_TABLET | Freq: Every day | ORAL | Status: DC
  Administered 2018-01-05 – 2018-01-06 (×2): 100 mg via ORAL
  Filled 2018-01-04 (×2): qty 2

## 2018-01-04 SURGICAL SUPPLY — 45 items
APPLIER CLIP 11 MED OPEN (CLIP) ×9
APPLIER CLIP 13 LRG OPEN (CLIP) ×6
APPLIER CLIP 9.375 SM OPEN (CLIP) ×3
BAG HAMPER (MISCELLANEOUS) ×3 IMPLANT
CHLORAPREP W/TINT 26ML (MISCELLANEOUS) ×3 IMPLANT
CLIP APPLIE 11 MED OPEN (CLIP) ×6 IMPLANT
CLIP APPLIE 13 LRG OPEN (CLIP) ×4 IMPLANT
CLIP APPLIE 9.375 SM OPEN (CLIP) ×2 IMPLANT
CLOTH BEACON ORANGE TIMEOUT ST (SAFETY) ×3 IMPLANT
COVER LIGHT HANDLE STERIS (MISCELLANEOUS) ×6 IMPLANT
DERMABOND ADVANCED (GAUZE/BANDAGES/DRESSINGS) ×2
DERMABOND ADVANCED .7 DNX12 (GAUZE/BANDAGES/DRESSINGS) ×4 IMPLANT
ELECT REM PT RETURN 9FT ADLT (ELECTROSURGICAL) ×3
ELECTRODE REM PT RTRN 9FT ADLT (ELECTROSURGICAL) ×2 IMPLANT
EVACUATOR DRAINAGE 10X20 100CC (DRAIN) ×2 IMPLANT
EVACUATOR SILICONE 100CC (DRAIN) ×1
GLOVE BIO SURGEON STRL SZ 6.5 (GLOVE) ×6 IMPLANT
GLOVE BIOGEL PI IND STRL 6.5 (GLOVE) ×2 IMPLANT
GLOVE BIOGEL PI IND STRL 7.0 (GLOVE) ×4 IMPLANT
GLOVE BIOGEL PI INDICATOR 6.5 (GLOVE) ×1
GLOVE BIOGEL PI INDICATOR 7.0 (GLOVE) ×2
GLOVE ECLIPSE 7.0 STRL STRAW (GLOVE) ×3 IMPLANT
GLOVE SURG SS PI 7.0 STRL IVOR (GLOVE) ×3 IMPLANT
GOWN STRL REUS W/TWL LRG LVL3 (GOWN DISPOSABLE) ×9 IMPLANT
HEMOSTAT ARISTA ABSORB 3G PWDR (MISCELLANEOUS) ×3 IMPLANT
KIT TURNOVER KIT A (KITS) ×3 IMPLANT
MANIFOLD NEPTUNE II (INSTRUMENTS) ×3 IMPLANT
NEEDLE HYPO 25X1 1.5 SAFETY (NEEDLE) ×3 IMPLANT
NS IRRIG 1000ML POUR BTL (IV SOLUTION) ×6 IMPLANT
PACK MINOR (CUSTOM PROCEDURE TRAY) ×3 IMPLANT
PAD ARMBOARD 7.5X6 YLW CONV (MISCELLANEOUS) ×3 IMPLANT
PENCIL HANDSWITCHING (ELECTRODE) ×3 IMPLANT
SET BASIN LINEN APH (SET/KITS/TRAYS/PACK) ×3 IMPLANT
SPONGE DRAIN TRACH 4X4 STRL 2S (GAUZE/BANDAGES/DRESSINGS) ×3 IMPLANT
SPONGE LAP 18X18 X RAY DECT (DISPOSABLE) ×6 IMPLANT
SUT ETHILON 3 0 FSL (SUTURE) ×3 IMPLANT
SUT MNCRL AB 4-0 PS2 18 (SUTURE) ×6 IMPLANT
SUT SILK 3 0 (SUTURE) ×1
SUT SILK 3-0 FS1 18XBRD (SUTURE) ×2 IMPLANT
SUT VIC AB 2-0 CT1 27 (SUTURE) ×1
SUT VIC AB 2-0 CT1 TAPERPNT 27 (SUTURE) ×2 IMPLANT
SUT VIC AB 3-0 SH 27 (SUTURE) ×2
SUT VIC AB 3-0 SH 27X BRD (SUTURE) ×4 IMPLANT
SYR CONTROL 10ML LL (SYRINGE) ×3 IMPLANT
TAPE CLOTH SURG 4X10 WHT LF (GAUZE/BANDAGES/DRESSINGS) ×3 IMPLANT

## 2018-01-04 NOTE — Progress Notes (Signed)
[facility]  Patient with some desaturations. Will keep overnight and monitor.  Curlene Labrum, MD [facility] [address] [place], [place] [address] [phone] (office)

## 2018-01-04 NOTE — Transfer of Care (Signed)
Immediate Anesthesia Transfer of Care Note  Patient: Toni [name]  Procedure(s) Performed: LEFT PARTIAL MASTECTOMY STATUS POST NEEDLE LOCALIZATION (Left Breast) LEFT AXILLARY LYMPH NODE DISSECTION (Left Axilla)  Patient Location: PACU  Anesthesia Type:General  Level of Consciousness: drowsy  Airway & Oxygen Therapy: Patient Spontanous Breathing and Patient connected to face mask oxygen  Post-op Assessment: Report given to RN, Post -op Vital signs reviewed and stable and Patient moving all extremities  Post vital signs: Reviewed and stable  Last Vitals:  Vitals:   01/04/18 0930 01/04/18 0945  BP: 118/86 130/70  Resp: 19 20  Temp:    SpO2: 93% 98%    Last Pain:  Vitals:   01/04/18 0915  TempSrc: Oral  PainSc: 0-No pain      Patients Stated Pain Goal: 7 (95/32/02 3343)  Complications: No apparent anesthesia complications

## 2018-01-04 NOTE — Progress Notes (Signed)
Attempted to contact pt's niece for update. Niece unavailable-went to get something to eat. Will attempt to contact later.

## 2018-01-04 NOTE — Interval H&P Note (Signed)
History and Physical Interval Note:  01/04/2018 9:28 AM  Corie Chiquito  has presented today for surgery, with the diagnosis of left breast mass, lymphadenopthy  The various methods of treatment have been discussed with the patient and family. After consideration of risks, benefits and other options for treatment, the patient has consented to  Procedure(s): PARTIAL MASTECTOMY WITH NEEDLE LOCALIZATION (Left) AXILLARY LYMPH NODE DISSECTION (Left) as a surgical intervention .  The patient's history has been reviewed, patient examined, no change in status, stable for surgery.  I have reviewed the patient's chart and labs.  Questions were answered to the patient's satisfaction.    [place] reviewed with adenopathy, repeat exam today with same. Discordant biopsies of the breast mass and the axilla with suspicious imaging findings. Discussed plans for partial mastectomy and axillary dissection with patient and risk of bleeding, infection, lymphedema, nerve injury, and possibility of needing radiation versus additional surgery. She understands and wants to proceed.   [name]

## 2018-01-04 NOTE — Progress Notes (Signed)
Niece at bedside

## 2018-01-04 NOTE — Anesthesia Procedure Notes (Signed)
Procedure Name: LMA Insertion Date/Time: 01/04/2018 10:04 AM Performed by: Charmaine Downs, CRNA Pre-anesthesia Checklist: Patient identified, Patient being monitored, Emergency Drugs available, Timeout performed and Suction available Patient Re-evaluated:Patient Re-evaluated prior to induction Oxygen Delivery Method: Circle System Utilized Preoxygenation: Pre-oxygenation with 100% oxygen Induction Type: IV induction Ventilation: Mask ventilation without difficulty LMA: LMA inserted LMA Size: 4.0 Number of attempts: 1 Placement Confirmation: positive ETCO2 and breath sounds checked- equal and bilateral Tube secured with: Tape Dental Injury: Teeth and Oropharynx as per pre-operative assessment

## 2018-01-04 NOTE — Anesthesia Preprocedure Evaluation (Addendum)
Anesthesia Evaluation  Patient identified by MRN, date of birth, ID band Patient awake    Airway Mallampati: I  TM Distance: >3 FB     Dental  (+) Teeth Intact   Pulmonary shortness of breath, pneumonia, resolved, former smoker,  IMPRESSION: CHF with mild pulmonary interstitial edema worse since the study of 5 days ago. No acute pneumonia.   (Feb 2019)    + decreased breath sounds      Cardiovascular Exercise Tolerance: Poor hypertension, Pt. on medications and Pt. on home beta blockers +CHF and + DOE  + Valvular Problems/Murmurs (Mitral Stenosis)  Rhythm:Regular Rate:Normal  Study Conclusions  (Dec 2018)  - Left ventricle: The cavity size was normal. Wall thickness was   increased in a pattern of mild LVH. Systolic function was mildly   reduced. The estimated ejection fraction was in the range of 45%   to 50%. Diffuse hypokinesis. The study is not technically   sufficient to allow evaluation of LV diastolic function. - Aortic valve: Mildly calcified annulus. Trileaflet. Mean gradient   (S): 3 mm Hg. Valve area (VTI): 2.21 cm^2. - Mitral valve: The findings are consistent with severe stenosis.   There was trivial regurgitation. Mean gradient (D): 11 mm Hg.   Planimetered valve area: 0.9 cm^2. Valve area by continuity   equation (using LVOT flow): 0.68 cm^2. - Left atrium: The atrium was severely dilated. - Right ventricle: The cavity size was mildly dilated. Systolic   function was moderately reduced. - Right atrium: The atrium was mildly dilated. Central venous   pressure (est): 15 mm Hg.   Neuro/Psych Anxiety Depression    GI/Hepatic GERD  ,Results for Toni [name], Toni [name] (MRN [id number]) as of 01/04/2018 07:58  01/02/2018 08:55 WBC: 4.5 RBC: 3.80 (L) Hemoglobin: 11.3 (L) HCT: 35.7 (L) MCV: 93.9 MCH: 29.7 MCHC: 31.7 RDW: 14.0 Platelets: 136 (L)    Endo/Other    Renal/GU Renal diseaseResults for Toni [name], Toni [name]  (MRN [id number]) as of 01/04/2018 07:58  01/02/2018 08:55 Sodium: 137 Potassium: 3.4 (L) Chloride: 100 (L) CO2: 26 Glucose: 88 BUN: 37 (H) Creatinine: 1.37 (H)      Musculoskeletal  (+) Arthritis , Osteoarthritis,    Abdominal Normal abdominal exam  (+) + obese,   Peds  Hematology  (+) anemia ,   Anesthesia Other Findings   Reproductive/Obstetrics                           Anesthesia Physical Anesthesia Plan  ASA: IV  Anesthesia Plan: General   Post-op Pain Management:    Induction:   PONV Risk Score and Plan:   Airway Management Planned: LMA  Additional Equipment:   Intra-op Plan:   Post-operative Plan: Extubation in OR  Informed Consent: I have reviewed the patients History and Physical, chart, labs and discussed the procedure including the risks, benefits and alternatives for the proposed anesthesia with the patient or authorized representative who has indicated his/her understanding and acceptance.   Dental advisory given  Plan Discussed with: CRNA and Surgeon  Anesthesia Plan Comments:         Anesthesia Quick Evaluation

## 2018-01-04 NOTE — Progress Notes (Signed)
Arouses to name. Eyes open. resp adequate/nonlabored. O2 sat 83-86% on Room air. O2 restarted with nasal cannula @ 2 l/m. Encouraged to deep breath. O2 sat increased to 97%. Tolerated well.

## 2018-01-04 NOTE — Progress Notes (Signed)
Transported from pacu at [place].  Dressing to left breast site jp drain dry and intact and no drainage noted in bulb.  Left breast and left axillary surgical sites intact and dry with dermabond. Patient was drowsy  But awakened easily and was oriented.  Since then has received pain medication and has requested food.  Now sitting in bed feeding self supper.  JP drain still has no drainage and is activated. Niece at bedside.  Vitals have been stable.

## 2018-01-04 NOTE — Progress Notes (Signed)
[facility]  Have further contemplated axillary adenopathy and nonpalpable mass with discordant findings on pathology.  Prior imaging done 09/2017 and no repeat since that time. Felt like I could feel adenopathy on repeat exam 2/14 but unsure how much it changed from first visit.   Plan for needle localization excision of the breast mass. Have spoke to Dr. Thornton Papas and will plan to also get a repeat US of the left axilla to assess for interval changes.  If adenopathy gone, then will likely just do the excisional biopsy of the breast mass, and wait for the results to decided about a sentinel node following diagnosis.   If adenopathy still present, the axillary adenopathy could represent breast cancer with still undiagnosed primary ( if the mass we excise comes back as benign), so axillary dissection would be indicated.   Curlene Labrum, MD [facility] [address] [place], [place] [address] [phone] (office)

## 2018-01-04 NOTE — Anesthesia Postprocedure Evaluation (Signed)
Anesthesia Post Note  Patient: Toni [name]  Procedure(s) Performed: LEFT PARTIAL MASTECTOMY STATUS POST NEEDLE LOCALIZATION (Left Breast) LEFT AXILLARY LYMPH NODE DISSECTION (Left Axilla)  Patient location during evaluation: PACU Anesthesia Type: General Level of consciousness: awake and patient cooperative Pain management: pain level controlled Vital Signs Assessment: post-procedure vital signs reviewed and stable Respiratory status: spontaneous breathing, nonlabored ventilation and respiratory function stable Cardiovascular status: blood pressure returned to baseline Postop Assessment: no apparent nausea or vomiting Anesthetic complications: no     Last Vitals:  Vitals:   01/04/18 1430 01/04/18 1445  BP: 133/75 133/76  Pulse: (!) 59 69  Resp: 18 16  Temp:    SpO2: 100% 97%    Last Pain:  Vitals:   01/04/18 1445  TempSrc:   PainSc: 7                  Zakyla Tonche J

## 2018-01-04 NOTE — Op Note (Signed)
[facility] Operative Note  01/04/18  Preoperative Diagnosis: Left breast mass and left axillary adenopathy concerning for malignancy    Postoperative Diagnosis: Same   Procedure(s) Performed: Left partial mastectomy and left axillary node dissection    Surgeon: Lanell Matar. Constance Haw, MD   Assistants: No qualified resident was available   Anesthesia: General endotracheal   Anesthesiologist: Mikey College, MD    Specimens:   1.) Left partial mastectomy (short superior, long lateral)  2.) Superior margin 3.) Deep margin 4.) Superficial margin 5.) Lateral margin 6.) Medial margin 7.) Axillary contents    Estimated Blood Loss: 100cc    Blood Replacement: None    Complications: None   Wound Class: Clean; 10 french JP drain left in place in axilla on left    Operative Indications: Toni [name] is a 62 yo with a remote history of a excisional biopsy in the left breast who was found to a adenopathy on the left during a CT for SOB back in the fall.  After this CT scan, a mammogram and [place] was performed that demonstrated a suspicious mass in the breast and adenopathy with 7+ abnormal lymph nodes, some of which measured > 3cm.  She underwent US guided biopsy, and the results came back discordant with the imaging studies, and she was sent to me for further evaluation.  We had her see by cardiology who reported she needed no further risk stratification, and after a discussion she opted to proceed with a partial mastectomy after needle localization and a axillary dissection given her clinically palpable nodes and concerning imaging. We discussed the risk and benefits including but not limited to bleeding, infection, risk of nerve injury, lymphedema, and need for possible radiation or further surgery.    Findings: Fibrotic tissue in the breast just superior to the nipple areola complex; extensive adenopathy extending from level I to II.    Procedure: Prior to coming to the  operating room the patient underwent needle localization of the breast mass, and repeat US of the axilla confirming the persistent adenopathy in the region.  I reviewed the imaging for the needle localization, and confirmed the location of the area of concern. The patient was then taken to the operating room and placed supine. General endotracheal anesthesia was induced. Intravenous antibiotics were administered per protocol.  The wire needle was trimmed.   The left chest and axilla were prepared and draped in the usual sterile fashion.   A circumareolar incision was made in the superior portion of the breast adjacent to the wire, and the wire was delivered into the wound.  Dissection was taken down circumferentially, taking care to include the entire localizing needle and a wide margin.  The specimen was retracted with an Alis clamp, and the inferior portion of the specimen was abutting the nipple -areola complex. There was significant scarring and fibrosis.  The specimen and the entire localizing wire were removed.  The specimen was oriented and sent to radiology for localization studies.  Confirmation was received that the entire lesion was resected.  In addition to this, representative margins at the superior, superficial, deep, medial and lateral borders of the cavity were taken. Inferior margins could not be removed due to the proximity to the nipple areola complex.   The cavity was irrigated and was hemostatic, a laparotomy pad was placed in the area.    Attention was then turned to axilla. Adenopathy was palpable.  A 6cm skin incision was made in the skin crease  just below the hair- bearing skin of the axilla extending from the lateral edge of the pectoralis major muscle to the anterior edge of the latissimus dorsi.  Flaps were raised cephalad and caudad to the estimated level of the axillary vein superior and the edge of the pectoralis major medially using electrocautery.  Significant adenopathy was  palpated. The clavipectoral fascia was then incised along the edge of the pectoralis major and the pectoralis major and minor were freed from the surrounding fat and nodal tissue.  Dissection progressed first under the pectoralis major and then under the pectoralis minor muscle.    The pectoral muscles were retracted medially. The medial pectoral neurovascular bundle was identified and preserved. The level II nodal tissue deep the the pectoralis minor was included in the dissection.  The axillary vein was identified and cleared of overlying fat. The first branch off the axillary vein was doubly clipped and divided with large hemoclips.  A small posterior vein branch went down to the thoracodorsal nerve bundle and this was clipped and divided with a medium clip.  The thoracodorsal nerve bundle identified and preserved.  The long thoracic nerve was identified along the edge of the latissimus dorsi on the chest wall and preserved. The was palpable nodes throughout this region, and the tissue was carefully dissected out, preserving the nerves.  The specimen was sent to pathology.   The wound was irrigated, and a laparotomy pad was placed in the wound.    Attention was then turned back to the partial mastectomy site, it was hemostatic. Given the proximity to the nipple areola complex, the inferior portion of the cavity was closed with a 2-0 running Vicryl.  The deep dermal layer was closed with a 3-0 Vicryl suture, and the skin was closed with a subcuticular 4-0 Monocryl and dermabond.  There was some cavitation of the area, and the deep space was not closed to intentionally make a seroma form.     The axilla was then inspected. Again the pectoral neurovascular bundle was noted to be preserved. The long thoracic and thoracodorsal bundle were preserved. The area was hemostatic. Arista was placed in the wound. A JP drain was placed in the axilla, and secured with a 3-0 nylon suture.  The clavipectoral fascia was  closed a running 3-0 Vircyl suture, and the skin was closed with a subcuticular 4-0 Monocryl and dermabond.    All counts were correct at the end of the case. The patient was awakened from anesthesia and extubate without complication.  The patient went to the PACU in stable condition.   Curlene Labrum, MD [facility] [address] [place], [place] [address] [phone] (office)

## 2018-01-04 NOTE — Progress Notes (Addendum)
Awake. Rates pain 9. resp adequate/nonlabored. O2 d/c'd. O2 sat decreased to 82-87% on room air. O2 restarted with nasal cannula at 2 l/m. Encouraged to deep breath. O2 sat increased to 96-100%. Tolerated well. Dr Constance Haw aware. Will admit for 23 hr OBS. Orders placed.

## 2018-01-04 NOTE — Interval H&P Note (Signed)
History and Physical Interval Note:  01/04/2018 9:31 AM  Toni [name]  has presented today for surgery, with the diagnosis of left breast mass, lymphadenopthy  The various methods of treatment have been discussed with the patient and family. After consideration of risks, benefits and other options for treatment, the patient has consented to  Procedure(s): PARTIAL MASTECTOMY WITH NEEDLE LOCALIZATION (Left) AXILLARY LYMPH NODE DISSECTION (Left) as a surgical intervention .  The patient's history has been reviewed, patient examined, no change in status, stable for surgery.  I have reviewed the patient's chart and labs.  Questions were answered to the patient's satisfaction.    Patient also with left arm pain that is like tingling, tickle, went to the ED this week. No cardiac issues. Some of this could be from the lymphadenopathy affecting the nerves. Discussed with patient and if worsens may have to increase her gabapentin more. Just had it increased.  [name]

## 2018-01-04 NOTE — Anesthesia Procedure Notes (Signed)
Procedure Name: Intubation Date/Time: 01/04/2018 11:50 AM Performed by: Charmaine Downs, CRNA Pre-anesthesia Checklist: Emergency Drugs available, Patient being monitored, Suction available and Patient identified Patient Re-evaluated:Patient Re-evaluated prior to induction Oxygen Delivery Method: Circle system utilized Preoxygenation: Pre-oxygenation with 100% oxygen Laryngoscope Size: Mac and 4 Grade View: Grade I Tube size: 7.0 mm Number of attempts: 1 Placement Confirmation: ETT inserted through vocal cords under direct vision,  positive ETCO2 and breath sounds checked- equal and bilateral Secured at: 23 cm Tube secured with: Tape Dental Injury: Teeth and Oropharynx as per pre-operative assessment

## 2018-01-05 ENCOUNTER — Encounter (HOSPITAL_COMMUNITY): Payer: Self-pay | Admitting: General Surgery

## 2018-01-05 ENCOUNTER — Observation Stay (HOSPITAL_COMMUNITY): Payer: Medicaid Other

## 2018-01-05 DIAGNOSIS — R0902 Hypoxemia: Secondary | ICD-10-CM

## 2018-01-05 LAB — BASIC METABOLIC PANEL
Anion gap: 10 (ref 5–15)
BUN: 13 mg/dL (ref 6–20)
CO2: 23 mmol/L (ref 22–32)
Calcium: 9.3 mg/dL (ref 8.9–10.3)
Chloride: 105 mmol/L (ref 101–111)
Creatinine, Ser: 1.16 mg/dL — ABNORMAL HIGH (ref 0.44–1.00)
GFR calc Af Amer: 56 mL/min — ABNORMAL LOW (ref >60)
GFR calc non Af Amer: 49 mL/min — ABNORMAL LOW (ref >60)
Glucose, Bld: 94 mg/dL (ref 65–99)
Potassium: 3.7 mmol/L (ref 3.5–5.1)
Sodium: 138 mmol/L (ref 135–145)

## 2018-01-05 LAB — CBC
HCT: 36.9 % (ref 36.0–46.0)
Hemoglobin: 12 g/dL (ref 12.0–15.0)
MCH: 30.8 pg (ref 26.0–34.0)
MCHC: 32.5 g/dL (ref 30.0–36.0)
MCV: 94.9 fL (ref 78.0–100.0)
Platelets: 97 10*3/uL — ABNORMAL LOW (ref 150–400)
RBC: 3.89 MIL/uL (ref 3.87–5.11)
RDW: 15.2 % (ref 11.5–15.5)
WBC: 3.6 10*3/uL — ABNORMAL LOW (ref 4.0–10.5)

## 2018-01-05 LAB — BRAIN NATRIURETIC PEPTIDE: B Natriuretic Peptide: 39 pg/mL (ref 0.0–100.0)

## 2018-01-05 MED ORDER — FUROSEMIDE 10 MG/ML IJ SOLN
10.0000 mg | Freq: Once | INTRAMUSCULAR | Status: AC
  Administered 2018-01-05: 10 mg via INTRAVENOUS
  Filled 2018-01-05: qty 2

## 2018-01-05 NOTE — Progress Notes (Signed)
[facility] Progress Note  1 Day Post-Op  Subjective: Attempt to wean O2 unsuccessful. On 1.5 L. Otherwise doing ok. Pain ok.   Objective: Vital signs in last 24 hours: Temp:  [97.4 F (36.3 C)-99 F (37.2 C)] 99 F (37.2 C) (02/28 1000) Pulse Rate:  [52-75] 66 (02/28 1000) Resp:  [14-25] 16 (02/28 1000) BP: (96-142)/(45-79) 96/45 (02/28 1000) SpO2:  [87 %-100 %] 96 % (02/28 1000) Weight:  [227 lb 1.2 oz (103 kg)] 227 lb 1.2 oz (103 kg) (02/27 1835) Last BM Date: 01/04/18  Intake/Output from previous day: 02/27 0701 - 02/28 0700 In: 700 [I.V.:700] Out: 100 [Blood:100] Intake/Output this shift: No intake/output data recorded.  General appearance: alert, cooperative and no distress Resp: normal work of breathing, no distress Breasts: left breast incision c/d/i with dermabond, left axilla incision c/d/i with dermabond, no erythema or drainage from either, JP drain without output, seems clogged Extremities: extremities normal, atraumatic, no cyanosis or edema  Lab Results:  Recent Labs    01/05/18 0509  WBC 3.6*  HGB 12.0  HCT 36.9  PLT 97*   BMET Recent Labs    01/05/18 0509  NA 138  K 3.7  CL 105  CO2 23  GLUCOSE 94  BUN 13  CREATININE 1.16*  CALCIUM 9.3   PT/INR No results for input(s): LABPROT, INR in the last 72 hours.  Studies/Results: [place] Axilla Left  Result Date: 01/04/2018 CLINICAL DATA:  Preoperative for needle localization of a LEFT breast biopsy site, follow-up of known LEFT axillary adenopathy to determine if it has persistent or resolved to determine surgical planning EXAM: ULTRASOUND OF THE LEFT AXILLA COMPARISON:  08/20/2017 FINDINGS: Two large adjacent LEFT axillary lymph nodes are identified. Larger node measures the 3.8 x 1.8 x 3.4 cm previously 3.3 cm in greatest size. Smaller node measures 2.5 x 1.4 x 2.5 cm previously 2.1 cm greatest size. Larger node contains a thickened cortex. Smaller node lacks a fatty hilum.  IMPRESSION: LEFT axillary adenopathy slightly increased since previous exam Electronically Signed   By: Lavonia Dana M.D.   On: 01/04/2018 08:37   Mm Lt Breast Bx W Loc Dev 1st Lesion Image Bx Spec Stereo Guide  Result Date: 01/04/2018 CLINICAL DATA:  Preoperative needle localization of a LEFT breast biopsy site EXAM: SPECIMEN RADIOGRAPH OF THE LEFT BREAST COMPARISON:  Preoperative needle localization images of 01/04/2018 FINDINGS: Status post excision of the LEFT breast. The wire tip and biopsy marker clip are present within the surgical specimen and were marked for pathology. IMPRESSION: Specimen radiograph of the LEFT breast. Electronically Signed   By: Lavonia Dana M.D.   On: 01/04/2018 11:01   Mm Lt Plc Breast Loc Dev   1st Lesion  Inc Mammo Guide  Result Date: 01/04/2018 CLINICAL DATA:  Preoperative needle localization LEFT breast EXAM: NEEDLE LOCALIZATION OF THE LEFT BREAST WITH MAMMO GUIDANCE COMPARISON:  December 2018 FINDINGS: Patient presents for preoperative needle localization of the LEFT breast. Procedure, risks and benefits were discussed with the patient and written informed consent was obtained. Access point to the biopsy site was localized with a BB and mammographic imaging. Skin prepped with chlorhexidine. Skin and soft tissues were anesthetized with 1 mL of 1% lidocaine. 5 cm length Kopan's needle was advanced into the LEFT breast and placed adjacent to the ribbon-shaped biopsy clip. A hookwire was placed and the needle was removed. Procedure was tolerated very well with the patient without immediate complication. IMPRESSION: Needle localization LEFT breast. No apparent  complications. Electronically Signed   By: Lavonia Dana M.D.   On: 01/04/2018 10:29    Anti-infectives: Anti-infectives (From admission, onward)   Start     Dose/Rate Route Frequency Ordered Stop   01/04/18 0900  ceFAZolin (ANCEF) IVPB 2g/100 mL premix     2 g 200 mL/hr over 30 Minutes Intravenous On call to O.R.  01/04/18 0733 01/04/18 0940      Assessment/Plan: Ms. Sabatino is a 62 yo with some post operative hypoxia needing O2. POD 1 sp LEFT PARTIAL MASTECTOMY STATUS POST NEEDLE LOCALIZATION LEFT AXILLARY LYMPH NODE DISSECTION. Otherwise ok. -Diet -PRN for pain -O2 wean -BNP and CXR now, may need lasix     LOS: 0 days    [name] 01/05/2018

## 2018-01-05 NOTE — Anesthesia Postprocedure Evaluation (Signed)
Anesthesia Post Note  Patient: Toni [name]  Procedure(s) Performed: LEFT PARTIAL MASTECTOMY STATUS POST NEEDLE LOCALIZATION (Left Breast) LEFT AXILLARY LYMPH NODE DISSECTION (Left Axilla)  Patient location during evaluation: Nursing Unit Anesthesia Type: General Level of consciousness: awake and alert and patient cooperative Pain management: pain level controlled Vital Signs Assessment: post-procedure vital signs reviewed and stable Respiratory status: spontaneous breathing, nonlabored ventilation and respiratory function stable Cardiovascular status: blood pressure returned to baseline Postop Assessment: no apparent nausea or vomiting and adequate PO intake Anesthetic complications: no     Last Vitals:  Vitals:   01/05/18 0537 01/05/18 1000  BP: (!) 128/46 (!) 96/45  Pulse: 75 66  Resp:  16  Temp: 36.8 C 37.2 C  SpO2: 91% 96%    Last Pain:  Vitals:   01/05/18 1000  TempSrc: Oral  PainSc:                  [name] J

## 2018-01-05 NOTE — Progress Notes (Signed)
Has been on room air for several hours now and has ambulated to bathroom.  On continuous pulse ox and tele monitor said she is staying between 88, 89 and into the lower 90's

## 2018-01-05 NOTE — Progress Notes (Signed)
O2 off. Sats 89-95. Lasix given, low dose.  Will keep overnight.  Drain without anything. Likely clogged.  Likely removed tomorrow.  Curlene Labrum, MD [facility] [address] [place], [place] [address] [phone] (office)

## 2018-01-05 NOTE — Addendum Note (Signed)
Addendum  created 01/05/18 1118 by Charmaine Downs, CRNA   Sign clinical note

## 2018-01-05 NOTE — Progress Notes (Signed)
On room air 78%.  2 liters 91-92%

## 2018-01-05 NOTE — Progress Notes (Signed)
[facility]  Mild but improved pulmonary edema. BNP normal.   Talked to RN. Trying to get her weaned.   Curlene Labrum, MD [facility] [address] [place], [place] [address] [phone] (office)

## 2018-01-06 ENCOUNTER — Telehealth: Payer: Self-pay | Admitting: General Surgery

## 2018-01-06 DIAGNOSIS — Z803 Family history of malignant neoplasm of breast: Secondary | ICD-10-CM | POA: Diagnosis not present

## 2018-01-06 DIAGNOSIS — Z823 Family history of stroke: Secondary | ICD-10-CM | POA: Diagnosis not present

## 2018-01-06 DIAGNOSIS — I050 Rheumatic mitral stenosis: Secondary | ICD-10-CM | POA: Diagnosis not present

## 2018-01-06 DIAGNOSIS — N6012 Diffuse cystic mastopathy of left breast: Secondary | ICD-10-CM | POA: Diagnosis not present

## 2018-01-06 DIAGNOSIS — Z87891 Personal history of nicotine dependence: Secondary | ICD-10-CM | POA: Diagnosis not present

## 2018-01-06 DIAGNOSIS — I5032 Chronic diastolic (congestive) heart failure: Secondary | ICD-10-CM | POA: Diagnosis not present

## 2018-01-06 DIAGNOSIS — G4700 Insomnia, unspecified: Secondary | ICD-10-CM | POA: Diagnosis not present

## 2018-01-06 DIAGNOSIS — R590 Localized enlarged lymph nodes: Secondary | ICD-10-CM | POA: Diagnosis not present

## 2018-01-06 DIAGNOSIS — K219 Gastro-esophageal reflux disease without esophagitis: Secondary | ICD-10-CM | POA: Diagnosis not present

## 2018-01-06 DIAGNOSIS — Z9049 Acquired absence of other specified parts of digestive tract: Secondary | ICD-10-CM | POA: Diagnosis not present

## 2018-01-06 DIAGNOSIS — Z91041 Radiographic dye allergy status: Secondary | ICD-10-CM | POA: Diagnosis not present

## 2018-01-06 DIAGNOSIS — Z841 Family history of disorders of kidney and ureter: Secondary | ICD-10-CM | POA: Diagnosis not present

## 2018-01-06 DIAGNOSIS — Z96651 Presence of right artificial knee joint: Secondary | ICD-10-CM | POA: Diagnosis present

## 2018-01-06 DIAGNOSIS — J811 Chronic pulmonary edema: Secondary | ICD-10-CM | POA: Diagnosis not present

## 2018-01-06 DIAGNOSIS — I480 Paroxysmal atrial fibrillation: Secondary | ICD-10-CM | POA: Diagnosis not present

## 2018-01-06 DIAGNOSIS — Z8249 Family history of ischemic heart disease and other diseases of the circulatory system: Secondary | ICD-10-CM | POA: Diagnosis not present

## 2018-01-06 DIAGNOSIS — N6320 Unspecified lump in the left breast, unspecified quadrant: Secondary | ICD-10-CM | POA: Diagnosis not present

## 2018-01-06 DIAGNOSIS — Z6835 Body mass index (BMI) 35.0-35.9, adult: Secondary | ICD-10-CM | POA: Diagnosis not present

## 2018-01-06 DIAGNOSIS — Z90710 Acquired absence of both cervix and uterus: Secondary | ICD-10-CM | POA: Diagnosis not present

## 2018-01-06 DIAGNOSIS — Z833 Family history of diabetes mellitus: Secondary | ICD-10-CM | POA: Diagnosis not present

## 2018-01-06 DIAGNOSIS — I110 Hypertensive heart disease with heart failure: Secondary | ICD-10-CM | POA: Diagnosis not present

## 2018-01-06 LAB — CBC
HCT: 31.7 % — ABNORMAL LOW (ref 36.0–46.0)
Hemoglobin: 10.2 g/dL — ABNORMAL LOW (ref 12.0–15.0)
MCH: 30.4 pg (ref 26.0–34.0)
MCHC: 32.2 g/dL (ref 30.0–36.0)
MCV: 94.3 fL (ref 78.0–100.0)
Platelets: 115 10*3/uL — ABNORMAL LOW (ref 150–400)
RBC: 3.36 MIL/uL — ABNORMAL LOW (ref 3.87–5.11)
RDW: 14.5 % (ref 11.5–15.5)
WBC: 9 10*3/uL (ref 4.0–10.5)

## 2018-01-06 LAB — BASIC METABOLIC PANEL
Anion gap: 9 (ref 5–15)
BUN: 16 mg/dL (ref 6–20)
CO2: 23 mmol/L (ref 22–32)
Calcium: 9.2 mg/dL (ref 8.9–10.3)
Chloride: 103 mmol/L (ref 101–111)
Creatinine, Ser: 0.88 mg/dL (ref 0.44–1.00)
GFR calc Af Amer: >60 mL/min (ref >60)
GFR calc non Af Amer: >60 mL/min (ref >60)
Glucose, Bld: 108 mg/dL — ABNORMAL HIGH (ref 65–99)
Potassium: 3.6 mmol/L (ref 3.5–5.1)
Sodium: 135 mmol/L (ref 135–145)

## 2018-01-06 MED ORDER — OXYCODONE HCL 5 MG PO TABS: 5.0000 mg | ORAL_TABLET | ORAL | 0 refills | Status: DC | PRN

## 2018-01-06 NOTE — Telephone Encounter (Signed)
[facility]  Diagnosis 1. Breast, lumpectomy, left - FIBROCYSTIC CHANGES WITH FOCAL USUAL DUCTAL HYPERPLASIA AND SCLEROSING ADENOSIS. - PREVIOUS BIOPSY SITE AND BIOPSY CLIP. - NO EVIDENCE OF MALIGNANCY. 2. Breast, excision, left superior margin - FIBROCYSTIC CHANGES. - NO EVIDENCE OF MALIGNANCY. 3. Breast, excision, left medial margin - FIBROCYSTIC CHANGES. - NO EVIDENCE OF MALIGNANCY. 4. Breast, excision, left lateral margin - FIBROCYSTIC CHANGES WITH FOCAL SCLEROSING ADENOSIS. - NO EVIDENCE OF MALIGNANCY. 5. Breast, excision, left deep margin - BENIGN BREAST TISSUE. - NO EVIDENCE OF MALIGNANCY. 6. Breast, excision, left superficial margin - BENIGN BREAST TISSUE. - NO EVIDENCE OF MALIGNANCY. 7. Lymph nodes, regional resection, left axillary - TWENTY-THREE BENIGN REACTIVE LYMPH NODES (0/23). - SEE MICROSCOPIC DESCRIPTION. 1 of 3 FINAL for Toni [name], Toni [name] ([id number]) Microscopic Comment 1. The lumpectomy specimen shows fibrocystic changes with focal usual ductal hyperplasia and focal sclerosing adenosis. There is also previous biopsy site and previous clip. There is no evidence of malignancy. 7. There are twenty-three lymph nodes some of which are enlarged with reactive lymphoid follicles which show foci progressive transformation of germinal centers. No metastatic carcinoma is identified. Dr Gari Crown has reviewed part 7 of this case and agrees. (JDP:ecj 01/06/2018) Claudette Laws MD Pathologist, Electronic Signature (Case signed 01/06/2018)  No cancer on any pathology. Notified the patient of the pathology.  Toni Labrum, MD [facility] [address] [place], [place] [address] [phone] (office)

## 2018-01-06 NOTE — Progress Notes (Signed)
0824 Patient c/o feeling wet on her back near her surgical site (LEFT breast). Upon observation, surgical site drsg noted saturated with bloody drainage, bed pad & pillow also noted saturated with bloody drainage. JP drain remains completely empty at this time. MD notified.

## 2018-01-06 NOTE — Discharge Instructions (Signed)
Gauze/ Pad to the armpit as needed for drainage. Expect some pink/ yellowish drainage for a few days.   Partial Mastectomy With or Without Axillary Lymph Node Removal, Care After Refer to this sheet in the next few weeks. These instructions provide you with information about caring for yourself after your procedure. Your health care provider may also give you more specific instructions. Your treatment has been planned according to current medical practices, but problems sometimes occur. Call your health care provider if you have any problems or questions after your procedure. What can I expect after the procedure? After your procedure, it is common to have:  Breast swelling.  Breast tenderness.  Stiffness in your arm or shoulder.  A change in the shape and feel of your breast.  Follow these instructions at home: Bathing  Take sponge baths until your health care provider says that you can start showering or bathing.  Do not take baths, swim, or use a hot tub until your health care provider approves. Incision care  There are many different ways to close and cover an incision, including stitches, skin glue, and adhesive strips. Follow your health care provider's instructions about: ? Incision care. ? Bandage (dressing) changes and removal. ? Incision closure removal.  Check your incision area every day for signs of infection. Watch for: ? Redness, swelling, or pain. ? Fluid, blood, or pus.  If you were sent home with a surgical drain in place, follow your health care provider's instructions for emptying it. Activity  Return to your normal activities as directed by your health care provider.  Avoid strenuous exercise.  Be careful to avoid any activities that could cause an injury to your arm on the side of your surgery.  Do not lift anything that is heavier than 10 lb (4.5 kg). Avoid lifting with the arm that is on the side of your surgery.  Do not carry heavy objects on your  shoulder.  After your drain is removed, you should perform exercises to keep your arm from getting stiff and swollen. Talk with your health care provider about which exercises are safe for you. General instructions  Take medicines only as directed by your health care provider.  Keep your dressing clean and dry.  You may eat what you usually do.  Wear a supportive bra as directed by your health care provider.  Keep your arm elevated when at rest.  Do not wear tight jewelry on your arm, wrist, or fingers on the side of your surgery.  Get checked for extra fluid around your lymph nodes (lymphedema) as often as told by your health care provider.  If you had any lymph nodes removed during your procedure, be sure to tell all of your health care providers. This is important information to share before you are involved in certain procedures, such as giving blood or having your blood pressure taken. Contact a health care provider if:  You have a fever.  Your pain medicine is not working.  Your swelling, weakness, or numbness in your arm has not improved after a few weeks.  You have new swelling in your breast or arm.  You have redness, swelling, or pain in your incision area.  You have fluid, blood, or pus coming from your incision. Get help right away if:  You have very bad pain in your breast or arm.  You have chest pain.  You have difficulty breathing. This information is not intended to replace advice given to you by your  health care provider. Make sure you discuss any questions you have with your health care provider. Document Released: 06/08/2004 Document Revised: 07/01/2016 Document Reviewed: 07/10/2014 Elsevier Interactive Patient Education  2018 [employer].  Axillary Lymph Node Dissection, Care After This sheet gives you information about how to care for yourself after your procedure. Your health care provider may also give you more specific instructions. If you have  problems or questions, contact your health care provider. What can I expect after the procedure? After the procedure, it is common to have:  Pain and soreness around your incision area.  Trouble moving your arm or shoulder.  A small amount of swelling in your arm.  Numbness on the upper and inside parts of your arm.  Follow these instructions at home: Medicines  Take over-the-counter and prescription medicines only as told by your health care provider.  If you were prescribed an antibiotic medicine, take it as told by your health care provider. Do not stop taking the antibiotic even if you start to feel better Incision care  Follow instructions from your health care provider about how to take care of your incision. Make sure you: ? Wash your hands with soap and water before you change your bandage (dressing). If soap and water are not available, use hand sanitizer. ? Change your dressing as told by your health care provider. ? Leave stitches (sutures), skin glue, or adhesive strips in place. These skin closures may need to stay in place for 2 weeks or longer. If adhesive strip edges start to loosen and curl up, you may trim the loose edges. Do not remove adhesive strips completely unless your health care provider tells you to do that.  Check your incision area every day for signs of infection. Check for: ? Redness, swelling, or pain. ? Fluid or blood. ? Warmth. ? Pus or a bad smell. Activity  Do arm and shoulder exercises as told by your health care provider. This may prevent movement problems and stiffness.  Return to your normal activities as told by your health care provider. Ask your health care provider what activities are safe for you. Avoid any activities that cause pain. Driving  Do not drive for 24 hours if you were given a medicine to help you relax (sedative).  Do not drive or use heavy machinery while taking prescription pain medicine. General instructions  If a  drainage tube was left in your breast, care for it as told by your health care provider. The drain may stay in place for up to 7-10 days.  Wear a compression garment on your arm as told by your health care provider. This may help to prevent blood clots and reduce swelling in your arm.  Do not use any products that contain nicotine or tobacco, such as cigarettes and e-cigarettes. If you need help quitting, ask your health care provider.  Do not take baths, swim, or use a hot tub until your health care provider approves. Ask your health care provider if you may take showers. You may only be allowed to take sponge baths for bathing.  Keep all follow-up visits as told by your health care provider. This is important. Contact a health care provider if:  Your arm is swollen, tight, and painful.  You have redness, swelling, or pain around your incision.  You have fluid or blood coming from your incision.  Your incision feels warm to the touch.  You have pus or a bad smell coming from your incision. Get  help right away if:  You have severe pain that does not get better with medicine.  You have a fever or chills.  You are confused.  You have chest pain.  You have shortness of breath. Summary  After the procedure, it is common to have pain and soreness and trouble moving your arm or shoulder.  A small amount of arm swelling is normal after the procedure. However, you should contact your health care provider if your arm is swollen, tight, and painful.  Wear a compression garment on your arm as told by your health care provider. This may help to prevent blood clots and reduce swelling in your arm.  Do arm and shoulder exercises as directed to help prevent movement problems and stiffness.  If a drainage tube was left in your breast, care for it as told by your health care provider. This information is not intended to replace advice given to you by your health care provider. Make sure you  discuss any questions you have with your health care provider. Document Released: 12/22/2016 Document Revised: 12/22/2016 Document Reviewed: 12/22/2016 Elsevier Interactive Patient Education  [employer].

## 2018-01-06 NOTE — Discharge Summary (Addendum)
Physician Discharge Summary  Patient ID: Toni [name] MRN: [id number] DOB/AGE: Jun 01, 1956 62 y.o.  Admit date: 01/04/2018 Discharge date: 01/06/2018  Admission Diagnoses:  Discharge Diagnoses:  Active Problems:   Left breast mass   Adenopathy   Breast mass, left Clinically undermined pulmonary edema  Discharged Condition: good  Hospital Course: [name] is a 62 yo with a left breast mass and adenopathy that are suspicoous for malignancy on her imaging but biopsies were  Discordant. She was taken to the OR for left partial mastectomy and left axillary dissection given the extent of her adenopathy.  Post operatively, she had some issues with desaturations, and prior to her OR she had a CXR that showed some pulmonary edema. She is not normally on O2 but required some after surgery. Repeat CXR was actually better and BNP was normal. I did give her a low dose lasix to help get rid of the remaining fluid, and to get her off the O2.  Prior to her discharge she was off O2. She did require some O2 at night but this is likely her baseline. During her stay, her JP drain was clogged and despite stripping it, I could not get it to drain. I removed th JP and expressed serous fluid from the drain site.  This was dressed with a gauze dressing, and she was instructed to  cover this area.   Consults: None  Significant Diagnostic Studies: CXR with pulmonary edema   Treatments: Left partial mastectomy and left axillary dissection; O2 therapy and lasix   Discharge Exam: Blood pressure (!) 147/85, pulse 74, temperature 98.6 F (37 C), temperature source Oral, resp. rate 17, height 5\' 7"  (1.702 m), weight 227 lb 1.2 oz (103 kg), SpO2 100 %. General appearance: alert, cooperative and no distress Resp: normal work breahting, no distress Breasts: normal appearance, no masses or tenderness, left breast incision c/d/i with dermabond, no erythema or drainge, left axilla incision c/d/i with dermabond no erythema or  drainage, drain site with serous drainage, gauze applied after drain removed GI: soft, non-tender; bowel sounds normal; no masses,  no organomegaly Extremities: extremities normal, atraumatic, no cyanosis or edema  Disposition: 01-Home or Self Care  Discharge Instructions    Call MD for:  difficulty breathing, headache or visual disturbances   Complete by:  As directed    Call MD for:  extreme fatigue   Complete by:  As directed    Call MD for:  persistant dizziness or light-headedness   Complete by:  As directed    Call MD for:  persistant nausea and vomiting   Complete by:  As directed    Call MD for:  redness, tenderness, or signs of infection (pain, swelling, redness, odor or green/yellow discharge around incision site)   Complete by:  As directed    Call MD for:  severe uncontrolled pain   Complete by:  As directed    Call MD for:  temperature >100.4   Complete by:  As directed    Diet - low sodium heart healthy   Complete by:  As directed    Increase activity slowly   Complete by:  As directed      Allergies as of 01/06/2018      Reactions   Iodinated Diagnostic Agents Swelling   EYES      Medication List    STOP taking these medications   HYDROcodone-acetaminophen 5-325 MG tablet Commonly known as:  NORCO/VICODIN     TAKE these medications  acetaminophen 500 MG tablet Commonly known as:  TYLENOL Take 1,000 mg by mouth daily as needed for moderate pain or headache.   albuterol 108 (90 Base) MCG/ACT inhaler Commonly known as:  PROVENTIL HFA;VENTOLIN HFA Inhale 2 puffs into the lungs every 6 (six) hours as needed for wheezing or shortness of breath.   amLODipine 5 MG tablet Commonly known as:  NORVASC Take 1 tablet (5 mg total) by mouth daily.   aspirin EC 81 MG tablet Take 1 tablet (81 mg total) by mouth daily.   cyclobenzaprine 10 MG tablet Commonly known as:  FLEXERIL Take 1 tablet (10 mg total) by mouth 3 (three) times daily as needed for muscle  spasms.   doxycycline 100 MG tablet Commonly known as:  VIBRA-TABS Take 1 tablet (100 mg total) by mouth 2 (two) times daily.   FLUoxetine 40 MG capsule Commonly known as:  PROZAC TAKE 1 CAPSULE BY MOUTH EVERY DAY   gabapentin 300 MG capsule Commonly known as:  NEURONTIN Take one tablet in the morning and three tablets in the evening as directed.   guaiFENesin 600 MG 12 hr tablet Commonly known as:  MUCINEX Take 600 mg by mouth 2 (two) times daily as needed (congestion).   ibuprofen 200 MG tablet Commonly known as:  ADVIL,MOTRIN Take 800 mg by mouth daily as needed for headache or moderate pain.   indomethacin 25 MG capsule Commonly known as:  INDOCIN 1 po q 8 hour prn gout pain   linaclotide 72 MCG capsule Commonly known as:  LINZESS Take 1 capsule (72 mcg total) by mouth daily before breakfast.   losartan 100 MG tablet Commonly known as:  COZAAR Take 1 tablet (100 mg total) by mouth daily.   magnesium gluconate 500 MG tablet Commonly known as:  MAGONATE Take 500 mg by mouth daily.   metoprolol tartrate 50 MG tablet Commonly known as:  LOPRESSOR Take 1 1/2 tablet by mouth twice a day   omeprazole 40 MG capsule Commonly known as:  PRILOSEC Take 1 capsule (40 mg total) by mouth daily.   oxyCODONE 5 MG immediate release tablet Commonly known as:  Oxy IR/ROXICODONE Take 1 tablet (5 mg total) by mouth every 4 (four) hours as needed for severe pain or breakthrough pain.   SALONPAS PAIN RELIEF PATCH EX Apply 1 patch topically daily as needed (pain).   torsemide 20 MG tablet Commonly known as:  DEMADEX Take 1 tablet (20 mg total) by mouth daily.   trolamine salicylate 10 % cream Commonly known as:  ASPERCREME Apply 1 application topically as needed for muscle pain.      Follow-up Information    Aviva Signs, MD Follow up on 01/10/2018.   Specialty:  General Surgery Why:  Dr. Arnoldo Morale aware that you will be seeing him and not Dr. Constance Haw. Axilla check. due to  drain being clogged and coming out.  Contact information: [id number] [address] [phone]           Signed: [name] 01/06/2018, 11:44 AM

## 2018-01-10 ENCOUNTER — Telehealth: Payer: Self-pay | Admitting: Family Medicine

## 2018-01-10 ENCOUNTER — Encounter: Payer: Self-pay | Admitting: General Surgery

## 2018-01-10 ENCOUNTER — Ambulatory Visit (INDEPENDENT_AMBULATORY_CARE_PROVIDER_SITE_OTHER): Payer: Self-pay | Admitting: General Surgery

## 2018-01-10 VITALS — BP 118/58 | HR 68 | Temp 97.1°F | Ht 67.0 in | Wt 236.0 lb

## 2018-01-10 DIAGNOSIS — Z09 Encounter for follow-up examination after completed treatment for conditions other than malignant neoplasm: Secondary | ICD-10-CM

## 2018-01-10 NOTE — Telephone Encounter (Signed)
Called patient regarding message below. No answer, left generic message for patient to return call.   

## 2018-01-10 NOTE — Progress Notes (Signed)
Subjective:     Toni [name]  Status post left breast biopsy and left axillary dissection.  Patient states that she has not had significant swelling in the left axilla since her drain was removed.  She was notified by Dr. Constance Haw that her pathology was all negative for malignancy.  She has 5 out of 10 incisional pain in the left axilla.  She has not had any drainage from either surgical wounds.  She denies any fever or chills. Objective:    BP (!) 118/58   Pulse 68   Temp (!) 97.1 F (36.2 C)   Ht 5\' 7"  (1.702 m)   Wt 236 lb (107 kg)   LMP  (LMP Unknown)   BMI 36.96 kg/m   General:  alert, cooperative and no distress  Left breast incision healing well. Left axillary incision healing well with some increased density below the incision and the subcutaneous tissue.  She does not have a significant fluid collection in the left axilla. Final pathology negative for malignancy.     Assessment:    Doing well postoperatively.    Plan:   Patient will monitor her left axilla.  No need for aspiration today.  Will follow-up with Dr. Constance Haw in 2 weeks for a wound check.

## 2018-01-10 NOTE — Telephone Encounter (Signed)
Left message with patient to return call to complete TOC call. Today is the last day. I will try again later.

## 2018-01-10 NOTE — Telephone Encounter (Signed)
Transition Care Management Follow-up Telephone Call   Date discharged?  01/06/18              How have you been since you were released from the hospital? Patient states she is fine, a little sore.   Do you understand why you were in the hospital? Yes   Do you understand the discharge instructions? Yes   Where were you discharged to? Home   Items Reviewed:  Medications reviewed: Yes  Allergies reviewed: Yes  Dietary changes reviewed: Yes  Referrals reviewed: Yes   Functional Questionnaire:   Activities of Daily Living (ADLs):  Patient is independent    Any transportation issues/concerns?: No   Any patient concerns? No   Confirmed importance and date/time of follow-up visits scheduled    Yes  Confirmed with patient if condition begins to worsen call PCP or go to the ER.  Patient was given the office number and encouraged to call back with question or concerns.  : Yes

## 2018-01-13 ENCOUNTER — Ambulatory Visit (INDEPENDENT_AMBULATORY_CARE_PROVIDER_SITE_OTHER): Payer: Medicaid Other | Admitting: Family Medicine

## 2018-01-13 ENCOUNTER — Other Ambulatory Visit: Payer: Self-pay

## 2018-01-13 ENCOUNTER — Encounter: Payer: Self-pay | Admitting: Family Medicine

## 2018-01-13 VITALS — BP 120/60 | HR 86 | Temp 97.2°F | Resp 16 | Ht 67.0 in | Wt 238.2 lb

## 2018-01-13 DIAGNOSIS — T148XXA Other injury of unspecified body region, initial encounter: Secondary | ICD-10-CM | POA: Diagnosis not present

## 2018-01-13 DIAGNOSIS — R600 Localized edema: Secondary | ICD-10-CM | POA: Diagnosis not present

## 2018-01-13 MED ORDER — TORSEMIDE 20 MG PO TABS: 20.0000 mg | ORAL_TABLET | Freq: Every day | ORAL | 3 refills | Status: DC

## 2018-01-13 MED ORDER — OXYCODONE HCL 5 MG PO TABS: 5.0000 mg | ORAL_TABLET | ORAL | 0 refills | Status: DC | PRN

## 2018-01-13 NOTE — Patient Instructions (Signed)
Increase the demadex 20 mg to 2 pills each morning for the next 3 days. Come to the lab on Monday for a potassium check   Hematoma A hematoma is a collection of blood. The collection of blood can turn into a hard, painful lump under the skin. Your skin may turn blue or yellow if the hematoma is close to the surface of the skin. Most hematomas get better in a few days to weeks. Some hematomas are serious and need medical care. Hematomas can be very small or very big. Follow these instructions at home:  Apply ice to the injured area: ? Put ice in a plastic bag. ? Place a towel between your skin and the bag. ? Leave the ice on for 20 minutes, 2-3 times a day for the first 1 to 2 days.  After the first 2 days, switch to using warm packs on the injured area.  Raise (elevate) the injured area to lessen pain and puffiness (swelling). You may also wrap the area with an elastic bandage. Make sure the bandage is not wrapped too tight.  If you have a painful hematoma on your leg or foot, you may use crutches for a couple days.  Only take medicines as told by your doctor. Get help right away if:  Your pain gets worse.  Your pain is not controlled with medicine.  You have a fever.  Your puffiness gets worse.  Your skin turns more blue or yellow.  Your skin over the hematoma breaks or starts bleeding.  Your hematoma is in your chest or belly (abdomen) and you are short of breath, feel weak, or have a change in consciousness.  Your hematoma is on your scalp and you have a headache that gets worse or a change in alertness or consciousness. This information is not intended to replace advice given to you by your health care provider. Make sure you discuss any questions you have with your health care provider. Document Released: 12/02/2004 Document Revised: 04/01/2016 Document Reviewed: 04/04/2013 Elsevier Interactive Patient Education  2017 [employer].

## 2018-01-13 NOTE — Progress Notes (Signed)
Patient ID: Toni [name], female    DOB: 25-Jul-1956, 62 y.o.   MRN: [id number]  Chief Complaint  Patient presents with  . Transitions Of Care    Allergies Iodinated diagnostic agents  Subjective:   Toni [name] is a 62 y.o. female who presents to Toni [name] today.  HPI Toni [name] presents for follow up. She was d/c from the hospital 7 days ago after having a breast/left axillary lymph node biopsy/dissection. Was told the nodes did not show evidence of cancer. No cancer was in breast. Reports that it was supposed to be a day surgery but ended up had to stay a few days b/c her oxygen level was low. Has been seen by Dr. Arnoldo Morale for a follow up.  Subsequent to her follow-up visit with Dr. Arnoldo Morale, she has developed some pain in the left axillae. Reports that that has had some pain like a toothache. The area is swollen per her report and difficult to lay arm flat against side. No fevers. No drainage at all from the wound. Reports that has felt like the area under her arm was warm.  But no associated skin changes or redness has not been able to shower due to the incision healing.  She denies any numbness or tingling in her arms.  She has full strength per her report but reports that her movement is limited due to pain.  Has been using the oxycodone for pain as needed after the surgery.  Reports that she only has a couple pills left.  Does not drive.  Has not had any side effects with the medication.  No falls.  Reports that since coming home from the hospital has had an increase in her baseline swelling in her lower extremities.  She reports that she has been taking her fluid pill as directed.  She denies any shortness of breath over her baseline.  She denies any cough.  She denies any chest pain or palpitations.  She has been staying with her sister.  She reports that she believes her swelling is worse because she has not been up and about and moving around as much since she had the  surgery.    Past Medical History:  Diagnosis Date  . Congestive heart failure, unspecified    Diastolic heart failure  . Degeneration of lumbar or lumbosacral intervertebral disc   . Depression   . Diverticulosis   . Dyspnea     chronic multifactorial large component of deconditioning  . Esophageal reflux   . Gout   . History of kidney stones   . Insomnia    Working third shift  . Mitral stenosis    Secondary to under-sized ring annuloplasty following mitral valve repair  . Mitral valve disorders(424.0)    Mild to moderate mitral regurgitation  . Mitral valve insufficiency and aortic valve insufficiency   . Morbid obesity ([place])   . Normocytic anemia   . Other chest pain   . Other dyspnea and respiratory abnormality   . Paroxysmal atrial fibrillation ([name])    Stable no recurrence  . Peripheral edema    chronic  . S/P Maze operation for atrial fibrillation 02/27/2015  . S/P mitral valve repair 02/27/2015   "radical repair" including resection of P2 segment of posterior leaflet with 26 mm Edwards Physio II annuloplasty ring  . S/P tricuspid valve repair 02/27/2015   26 mm annuloplasty ring  . Unspecified essential hypertension     Past Surgical History:  Procedure Laterality Date  . APPENDECTOMY    . AXILLARY LYMPH NODE DISSECTION Left 01/04/2018   Procedure: LEFT AXILLARY LYMPH NODE DISSECTION;  Surgeon: [name], MD;  Location: [facility];  Service: General;  Laterality: Left;  . BREAST BIOPSY Left 2009  . CARDIAC VALVE SURGERY  02/27/2015   Mikel Cella. Mitral Valve Repair. Radical Repair with resection of P2 Prolapse, 26 mm Physio II Ring Annuloplasty, Tricuspid Valve Repair (26 mm Ring Annuloplasty, Cox Maze IV Procedure on 02/27/2015  . CHOLECYSTECTOMY    . COLONOSCOPY  2011   Dr. Laural Golden: 75mm polyp from hepatic flexure (tubular adenoma)  . COLONOSCOPY N/A 11/10/2016   Dr. Gala Romney: Diverticulosis. Next colonoscopy 7 years, January 2025  . ESOPHAGOGASTRODUODENOSCOPY   2005    Dr. Gala Romney : normal esophagus s/p dilation. Small hiatal hernia noted. Felt improved after dilation.   . ESOPHAGOGASTRODUODENOSCOPY N/A 11/10/2016   Dr. Gala Romney: Moderate-sized hiatal hernia, esophagus normal status post empiric dilation for history of dysphagia  . HEEL SPUR SURGERY Right   . MALONEY DILATION N/A 11/10/2016   Procedure: Venia Minks DILATION;  Surgeon: Daneil Dolin, MD;  Location: [facility];  Service: Endoscopy;  Laterality: N/A;  . MINIMALLY INVASIVE TRICUSPID VALVE REPAIR  02/27/2015   [place]  . MITRAL VALVE REPAIR  02/27/2015   [place]  . PARTIAL MASTECTOMY WITH NEEDLE LOCALIZATION Left 01/04/2018   Procedure: LEFT PARTIAL MASTECTOMY STATUS POST NEEDLE LOCALIZATION;  Surgeon: [name], MD;  Location: [facility];  Service: General;  Laterality: Left;  . TEE WITHOUT CARDIOVERSION N/A 10/18/2016   Procedure: TRANSESOPHAGEAL ECHOCARDIOGRAM (TEE);  Surgeon: Arnoldo Lenis, MD;  Location: [facility];  Service: Endoscopy;  Laterality: N/A;  . TOTAL ABDOMINAL HYSTERECTOMY    . TOTAL KNEE ARTHROPLASTY Right   . TUBAL LIGATION      Family History  Problem Relation Age of Onset  . Hypertension Mother   . Heart disease Mother   . Kidney disease Father   . Stroke Father   . Diabetes Sister   . Kidney disease Sister   . Heart disease Sister   . Hypertension Sister   . Heart disease Brother   . Heart attack Son   . Hypertension Son   . Cancer Paternal Grandmother        Breast Cancer  . Heart disease Brother   . Colon cancer Neg Hx      Social History   Socioeconomic History  . Marital status: Single    Spouse name: None  . Number of children: None  . Years of education: None  . Highest education level: None  Social Needs  . Financial resource strain: None  . Food insecurity - worry: None  . Food insecurity - inability: None  . Transportation needs - medical: None  . Transportation needs -  non-medical: None  Occupational History  . None  Tobacco Use  . Smoking status: Former Smoker    Packs/day: 0.50    Years: 25.00    Pack years: 12.50    Types: Cigarettes    Last attempt to quit: 1997    Years since quitting: 22.1  . Smokeless tobacco: Former Systems developer    Types: Chew    Quit date: 52  . Tobacco comment: dipped tobacco during pregancy in 1975  Substance and Sexual Activity  . Alcohol use: No  . Drug use: No  . Sexual activity: Yes    Partners: Male  Other Topics Concern  .  None  Social History Narrative   Lives alone or son will stay with her at times. Cooks. Eats well.    Current Outpatient Medications on File Prior to Visit  Medication Sig Dispense Refill  . acetaminophen (TYLENOL) 500 MG tablet Take 1,000 mg by mouth daily as needed for moderate pain or headache.    . albuterol (PROVENTIL HFA;VENTOLIN HFA) 108 (90 Base) MCG/ACT inhaler Inhale 2 puffs into the lungs every 6 (six) hours as needed for wheezing or shortness of breath. 1 Inhaler 3  . amLODipine (NORVASC) 5 MG tablet Take 1 tablet (5 mg total) by mouth daily. 90 tablet 3  . aspirin EC 81 MG tablet Take 1 tablet (81 mg total) by mouth daily. 90 tablet 3  . cyclobenzaprine (FLEXERIL) 10 MG tablet Take 1 tablet (10 mg total) by mouth 3 (three) times daily as needed for muscle spasms. 30 tablet 0  . doxycycline (VIBRA-TABS) 100 MG tablet Take 1 tablet (100 mg total) by mouth 2 (two) times daily. 20 tablet 0  . FLUoxetine (PROZAC) 40 MG capsule TAKE 1 CAPSULE BY MOUTH EVERY DAY 30 capsule 1  . gabapentin (NEURONTIN) 300 MG capsule Take one tablet in the morning and three tablets in the evening as directed. 270 capsule 1  . guaiFENesin (MUCINEX) 600 MG 12 hr tablet Take 600 mg by mouth 2 (two) times daily as needed (congestion).    Marland Kitchen ibuprofen (ADVIL,MOTRIN) 200 MG tablet Take 800 mg by mouth daily as needed for headache or moderate pain.    . indomethacin (INDOCIN) 25 MG capsule 1 po q 8 hour prn gout pain  20 capsule 1  . linaclotide (LINZESS) 72 MCG capsule Take 1 capsule (72 mcg total) by mouth daily before breakfast. 90 capsule 3  . Liniments (SALONPAS PAIN RELIEF PATCH EX) Apply 1 patch topically daily as needed (pain).    Marland Kitchen losartan (COZAAR) 100 MG tablet Take 1 tablet (100 mg total) by mouth daily. 30 tablet 11  . magnesium gluconate (MAGONATE) 500 MG tablet Take 500 mg by mouth daily.    . metoprolol tartrate (LOPRESSOR) 50 MG tablet Take 1 1/2 tablet by mouth twice a day 90 tablet 1  . omeprazole (PRILOSEC) 40 MG capsule Take 1 capsule (40 mg total) by mouth daily. 90 capsule 1  . trolamine salicylate (ASPERCREME) 10 % cream Apply 1 application topically as needed for muscle pain.     No current facility-administered medications on file prior to visit.     Review of Systems   Objective:   BP 120/60 (BP Location: Left Arm, Patient Position: Sitting, Cuff Size: Normal)   Pulse 86   Temp (!) 97.2 F (36.2 C) (Temporal)   Resp 16   Ht 5\' 7"  (1.702 m)   Wt 238 lb 4 oz (108.1 kg)   LMP  (LMP Unknown)   SpO2 94%   BMI 37.32 kg/m   Physical Exam  Constitutional: She appears well-developed and well-nourished.  HENT:  Head: Normocephalic and atraumatic.  Eyes: EOM are normal. Pupils are equal, round, and reactive to light.  Neck: Normal range of motion. Neck supple. No JVD present. No tracheal deviation present. No thyromegaly present.  Cardiovascular: Normal rate and regular rhythm.  Pulmonary/Chest: Effort normal and breath sounds normal. No respiratory distress. She has no wheezes.  No crackles auscultated.  Left breast with healing incision site consistent with surgery.  Left axillary region with a 10 cm incision, healing with no associated dehiscence or erythema.  Surrounding  16 x 9 cm area of induration/hardness to palpation.  No associated fluctuance, erythema.  Area is slightly tender to palpation.  Neuro vasculature of left upper extremity intact.  Musculoskeletal: Normal  range of motion.  Lymphadenopathy:    She has no cervical adenopathy.  Skin: Skin is warm and dry.  2+ pitting edema in lower extremities bilaterally.  No associated erythema.  Homans sign negative.  No calf tenderness to palpation.  Psychiatric: She has a normal mood and affect. Her behavior is normal.  Vitals reviewed.    Assessment and Plan  1. Hematoma Attempted to call Dr. Fransisca Kaufmann office.  However only able to obtain voice message.  Discussed with patient that she likely has a hematoma secondary to her surgery.  Discussed with patient that at this time will treat her pain.  She was told that if the area increases in size, becomes more painful, develops associated redness that she needed to go to the emergency department over the weekend.  We also discussed that if her surgical site starts to open up or drain this would be an indication for her to be seen at the emergency department/urgent care clinic.  She was given a handout today regarding hematoma.  She was also told if she develops any fevers, chills, nausea, vomiting or other worrisome signs or symptoms to please contact our office or Dr. Arnoldo Morale office.  She was told she could use heat or cool compresses to the area as tolerated.  She will keep her scheduled follow-up with surgery and call our office on Monday and let us know how she is doing.  We will also forward this office note to Dr. Prentiss Bells. Arnoldo Morale - oxyCODONE (OXY IR/ROXICODONE) 5 MG immediate release tablet; Take 1 tablet (5 mg total) by mouth every 4 (four) hours as needed for severe pain or breakthrough pain.  Dispense: 20 tablet; Refill: 0  2. Bilateral edema of lower extremity Discussed with patient that I believe that her edema is secondary to her immobility.  She has no evidence of a DVT at this time.  We will increase her Demadex to 2 pills a day for the next 3 days.  She will return to clinic on Monday and get a potassium check.  We did discuss watching her  salt intake and compliance with her medication.  She will continue all medications as directed and keep her scheduled follow-up in our office. - torsemide (DEMADEX) 20 MG tablet; Take 1 tablet (20 mg total) by mouth daily.  Dispense: 90 tablet; Refill: 3 - Basic Metabolic Panel (BMET)  Return in about 4 weeks (around 02/10/2018). Caren Macadam, MD 01/13/2018

## 2018-01-14 ENCOUNTER — Other Ambulatory Visit: Payer: Self-pay

## 2018-01-14 ENCOUNTER — Encounter (HOSPITAL_COMMUNITY): Payer: Self-pay | Admitting: Emergency Medicine

## 2018-01-14 ENCOUNTER — Emergency Department (HOSPITAL_COMMUNITY)
Admission: EM | Admit: 2018-01-14 | Discharge: 2018-01-15 | Disposition: A | Discharge status: Home / Self Care | Payer: Medicaid Other | Attending: Emergency Medicine | Admitting: Emergency Medicine

## 2018-01-14 DIAGNOSIS — Z9049 Acquired absence of other specified parts of digestive tract: Secondary | ICD-10-CM | POA: Insufficient documentation

## 2018-01-14 DIAGNOSIS — Y829 Unspecified medical devices associated with adverse incidents: Secondary | ICD-10-CM | POA: Diagnosis not present

## 2018-01-14 DIAGNOSIS — Z79899 Other long term (current) drug therapy: Secondary | ICD-10-CM | POA: Diagnosis not present

## 2018-01-14 DIAGNOSIS — L7632 Postprocedural hematoma of skin and subcutaneous tissue following other procedure: Secondary | ICD-10-CM

## 2018-01-14 DIAGNOSIS — Z96651 Presence of right artificial knee joint: Secondary | ICD-10-CM | POA: Diagnosis not present

## 2018-01-14 DIAGNOSIS — L7621 Postprocedural hemorrhage and hematoma of skin and subcutaneous tissue following a dermatologic procedure: Secondary | ICD-10-CM | POA: Insufficient documentation

## 2018-01-14 DIAGNOSIS — I4581 Long QT syndrome: Secondary | ICD-10-CM | POA: Diagnosis not present

## 2018-01-14 DIAGNOSIS — I5032 Chronic diastolic (congestive) heart failure: Secondary | ICD-10-CM | POA: Insufficient documentation

## 2018-01-14 DIAGNOSIS — Z87891 Personal history of nicotine dependence: Secondary | ICD-10-CM | POA: Insufficient documentation

## 2018-01-14 DIAGNOSIS — R9431 Abnormal electrocardiogram [ECG] [EKG]: Secondary | ICD-10-CM

## 2018-01-14 DIAGNOSIS — Z7982 Long term (current) use of aspirin: Secondary | ICD-10-CM | POA: Diagnosis not present

## 2018-01-14 DIAGNOSIS — T8189XA Other complications of procedures, not elsewhere classified, initial encounter: Secondary | ICD-10-CM | POA: Diagnosis present

## 2018-01-14 DIAGNOSIS — I110 Hypertensive heart disease with heart failure: Secondary | ICD-10-CM | POA: Diagnosis not present

## 2018-01-14 LAB — CBC WITH DIFFERENTIAL/PLATELET
Basophils Absolute: 0 10*3/uL (ref 0.0–0.1)
Basophils Relative: 0 %
Eosinophils Absolute: 0.1 10*3/uL (ref 0.0–0.7)
Eosinophils Relative: 1 %
HCT: 30.5 % — ABNORMAL LOW (ref 36.0–46.0)
Hemoglobin: 9.7 g/dL — ABNORMAL LOW (ref 12.0–15.0)
Lymphocytes Relative: 16 %
Lymphs Abs: 1.1 10*3/uL (ref 0.7–4.0)
MCH: 30 pg (ref 26.0–34.0)
MCHC: 31.8 g/dL (ref 30.0–36.0)
MCV: 94.4 fL (ref 78.0–100.0)
Monocytes Absolute: 0.6 10*3/uL (ref 0.1–1.0)
Monocytes Relative: 8 %
Neutro Abs: 5 10*3/uL (ref 1.7–7.7)
Neutrophils Relative %: 75 %
Platelets: 123 10*3/uL — ABNORMAL LOW (ref 150–400)
RBC: 3.23 MIL/uL — ABNORMAL LOW (ref 3.87–5.11)
RDW: 13.9 % (ref 11.5–15.5)
WBC: 6.7 10*3/uL (ref 4.0–10.5)

## 2018-01-14 MED ORDER — HYDROMORPHONE HCL 1 MG/ML IJ SOLN
1.0000 mg | INTRAMUSCULAR | Status: AC
  Administered 2018-01-14: 1 mg via INTRAMUSCULAR
  Filled 2018-01-14: qty 1

## 2018-01-14 NOTE — Discharge Instructions (Signed)
Please see Dr. Constance Haw or Dr. Arnoldo Morale this week for repeat evaluation ER for worsening symptoms.  Your EKG has shows a "PROLONGED QT INTERVAL" in the past and still does show today - please talk to your doctor about this and your Cardiologist - let doctors know that you have this before they prescribe you medicines.

## 2018-01-14 NOTE — ED Provider Notes (Addendum)
[facility] EMERGENCY DEPARTMENT Provider Note   CSN: [id number] Arrival date & time: 01/14/18  2132     History   Chief Complaint Chief Complaint  Patient presents with  . Wound Check    HPI Toni [name] is a 62 y.o. female.  HPI  62 year old female, she has a history of congestive heart failure as well as a history of a recent breast mass resection and lymph node dissection of the left axilla.  This procedure was performed recently, her drain came out very quickly and unfortunately did not drain most of the fluid that was in there and she has had a progressive seroma or hematoma.  She was seen in the office approximately 4 days ago by Dr. Arnoldo Morale and per notes there was no indication for aspiration of fluid at that time as there had been minimal fluid collection.  She reports that this has progressed  She was actually seen by her family doctor Dr. Caren Macadam yesterday, message was left with the surgical service as regards of her hematoma, they were treating her pain but recommended that she come to the hospital if the pain was getting worse or the swelling was getting worse.  She was prescribed 5 mg of immediate release oxycodone every 4 hours which she has been using but which makes her nauseated and gives her headache.  Patient denies fevers or redness of the skin.  Past Medical History:  Diagnosis Date  . Congestive heart failure, unspecified    Diastolic heart failure  . Degeneration of lumbar or lumbosacral intervertebral disc   . Depression   . Diverticulosis   . Dyspnea     chronic multifactorial large component of deconditioning  . Esophageal reflux   . Gout   . History of kidney stones   . Insomnia    Working third shift  . Mitral stenosis    Secondary to under-sized ring annuloplasty following mitral valve repair  . Mitral valve disorders(424.0)    Mild to moderate mitral regurgitation  . Mitral valve insufficiency and aortic valve insufficiency   .  Morbid obesity ([place])   . Normocytic anemia   . Other chest pain   . Other dyspnea and respiratory abnormality   . Paroxysmal atrial fibrillation ([name])    Stable no recurrence  . Peripheral edema    chronic  . S/P Maze operation for atrial fibrillation 02/27/2015  . S/P mitral valve repair 02/27/2015   "radical repair" including resection of P2 segment of posterior leaflet with 26 mm Edwards Physio II annuloplasty ring  . S/P tricuspid valve repair 02/27/2015   26 mm annuloplasty ring  . Unspecified essential hypertension     Patient Active Problem List   Diagnosis Date Noted  . Breast mass, left 01/04/2018  . Left breast mass   . Adenopathy   . Upper airway cough syndrome 11/24/2017  . Esophageal reflux 11/10/2017  . Neuropathic pain 09/16/2017  . Anemia of unknown etiology 08/17/2017  . Anxiety 08/17/2017  . Globus sensation 05/10/2017  . PNA (pneumonia) 04/18/2017  . CAP (community acquired pneumonia) 04/17/2017  . Elevated troponin 04/17/2017  . Mitral stenosis   . Dysphagia 10/20/2016  . Constipation 10/20/2016  . S/P mitral valve repair 02/27/2015  . S/P Maze operation for atrial fibrillation 02/27/2015  . S/P tricuspid valve repair 02/27/2015  . Chronic kidney disease, stage II (mild)   . Insomnia 09/02/2011  . Chronic back pain 09/02/2011  . EDEMA 01/18/2011  . HYPOKALEMIA 09/17/2010  .  ANEMIA 09/11/2010  . Chronic diastolic heart failure ([place]) 08/28/2010  . OTH NONSPECIFIC ABNORM CV SYSTEM FUNCTION STUDY 08/11/2010  . PRECORDIAL PAIN 07/30/2010  . DOE (dyspnea on exertion) 12/26/2009  . ACUTE ON CHRONIC DIASTOLIC HEART FAILURE 64/40/3474  . MITRAL REGURGITATION 07/07/2009  . Essential hypertension 07/07/2009  . ATRIAL FIBRILLATION 07/07/2009    Past Surgical History:  Procedure Laterality Date  . APPENDECTOMY    . AXILLARY LYMPH NODE DISSECTION Left 01/04/2018   Procedure: LEFT AXILLARY LYMPH NODE DISSECTION;  Surgeon: [name], MD;  Location: [facility]  ORS;  Service: General;  Laterality: Left;  . BREAST BIOPSY Left 2009  . CARDIAC VALVE SURGERY  02/27/2015   Mikel Cella. Mitral Valve Repair. Radical Repair with resection of P2 Prolapse, 26 mm Physio II Ring Annuloplasty, Tricuspid Valve Repair (26 mm Ring Annuloplasty, Cox Maze IV Procedure on 02/27/2015  . CHOLECYSTECTOMY    . COLONOSCOPY  2011   Dr. Laural Golden: 70mm polyp from hepatic flexure (tubular adenoma)  . COLONOSCOPY N/A 11/10/2016   Dr. Gala Romney: Diverticulosis. Next colonoscopy 7 years, January 2025  . ESOPHAGOGASTRODUODENOSCOPY  2005    Dr. Gala Romney : normal esophagus s/p dilation. Small hiatal hernia noted. Felt improved after dilation.   . ESOPHAGOGASTRODUODENOSCOPY N/A 11/10/2016   Dr. Gala Romney: Moderate-sized hiatal hernia, esophagus normal status post empiric dilation for history of dysphagia  . HEEL SPUR SURGERY Right   . MALONEY DILATION N/A 11/10/2016   Procedure: Venia Minks DILATION;  Surgeon: Daneil Dolin, MD;  Location: [facility] ENDO SUITE;  Service: Endoscopy;  Laterality: N/A;  . MINIMALLY INVASIVE TRICUSPID VALVE REPAIR  02/27/2015   [place]  . MITRAL VALVE REPAIR  02/27/2015   [place]  . PARTIAL MASTECTOMY WITH NEEDLE LOCALIZATION Left 01/04/2018   Procedure: LEFT PARTIAL MASTECTOMY STATUS POST NEEDLE LOCALIZATION;  Surgeon: [name], MD;  Location: [facility] ORS;  Service: General;  Laterality: Left;  . TEE WITHOUT CARDIOVERSION N/A 10/18/2016   Procedure: TRANSESOPHAGEAL ECHOCARDIOGRAM (TEE);  Surgeon: Arnoldo Lenis, MD;  Location: [facility] ENDO SUITE;  Service: Endoscopy;  Laterality: N/A;  . TOTAL ABDOMINAL HYSTERECTOMY    . TOTAL KNEE ARTHROPLASTY Right   . TUBAL LIGATION      OB History    No data available       Home Medications    Prior to Admission medications   Medication Sig Start Date End Date Taking? Authorizing Provider  acetaminophen (TYLENOL) 500 MG tablet Take 1,000 mg by mouth daily as needed for moderate  pain or headache.   Yes [provider]  albuterol (PROVENTIL HFA;VENTOLIN HFA) 108 (90 Base) MCG/ACT inhaler Inhale 2 puffs into the lungs every 6 (six) hours as needed for wheezing or shortness of breath. 06/29/17  Yes Soyla Dryer, PA-C  amLODipine (NORVASC) 5 MG tablet Take 1 tablet (5 mg total) by mouth daily. 06/17/17  Yes Lendon Colonel, NP  aspirin EC 81 MG tablet Take 1 tablet (81 mg total) by mouth daily. 11/15/17  Yes Strader, [place] M, PA-C  cyclobenzaprine (FLEXERIL) 10 MG tablet Take 1 tablet (10 mg total) by mouth 3 (three) times daily as needed for muscle spasms. 10/25/17  Yes Hagler, Apolonio Schneiders, MD  FLUoxetine (PROZAC) 40 MG capsule TAKE 1 CAPSULE BY MOUTH EVERY DAY 12/26/17  Yes Hagler, Apolonio Schneiders, MD  gabapentin (NEURONTIN) 300 MG capsule Take one tablet in the morning and three tablets in the evening as directed. Patient taking differently: Take 300-900 mg by mouth See admin instructions.  Take one tablet in the morning and three tablets in the evening as directed. 09/16/17  Yes Caren Macadam, MD  guaiFENesin (MUCINEX) 600 MG 12 hr tablet Take 600 mg by mouth 2 (two) times daily as needed (congestion).   Yes [provider]  ibuprofen (ADVIL,MOTRIN) 200 MG tablet Take 400-800 mg by mouth daily as needed for headache or moderate pain.    Yes [provider]  indomethacin (INDOCIN) 25 MG capsule 1 po q 8 hour prn gout pain Patient taking differently: Take 25 mg by mouth every 8 (eight) hours as needed for mild pain or moderate pain (FOR GOUT PAIN).  10/25/17  Yes Caren Macadam, MD  linaclotide [facility]) 72 MCG capsule Take 1 capsule (72 mcg total) by mouth daily before breakfast. 11/10/17  Yes Mahala Menghini, PA-C  losartan (COZAAR) 100 MG tablet Take 1 tablet (100 mg total) by mouth daily. 11/30/17  Yes Tanda Rockers, MD  magnesium gluconate (MAGONATE) 500 MG tablet Take 500 mg by mouth daily.   Yes [provider]  metoprolol tartrate (LOPRESSOR)  50 MG tablet Take 1 1/2 tablet by mouth twice a day Patient taking differently: Take 75 mg by mouth 2 (two) times daily. Take 1 1/2 tablet by mouth twice a day 12/20/17  Yes Hagler, Apolonio Schneiders, MD  omeprazole (PRILOSEC) 40 MG capsule Take 1 capsule (40 mg total) by mouth daily. 10/25/17  Yes Caren Macadam, MD  oxyCODONE (OXY IR/ROXICODONE) 5 MG immediate release tablet Take 1 tablet (5 mg total) by mouth every 4 (four) hours as needed for severe pain or breakthrough pain. 01/13/18  Yes Caren Macadam, MD  torsemide (DEMADEX) 20 MG tablet Take 1 tablet (20 mg total) by mouth daily. Patient taking differently: Take 20-40 mg by mouth daily.  01/13/18  Yes Hagler, Apolonio Schneiders, MD  trolamine salicylate (ASPERCREME) 10 % cream Apply 1 application topically as needed for muscle pain.   Yes [provider]  doxycycline (VIBRA-TABS) 100 MG tablet Take 1 tablet (100 mg total) by mouth 2 (two) times daily. Patient not taking: Reported on 01/14/2018 12/20/17   Caren Macadam, MD    Family History Family History  Problem Relation Age of Onset  . Hypertension Mother   . Heart disease Mother   . Kidney disease Father   . Stroke Father   . Diabetes Sister   . Kidney disease Sister   . Heart disease Sister   . Hypertension Sister   . Heart disease Brother   . Heart attack Son   . Hypertension Son   . Cancer Paternal Grandmother        Breast Cancer  . Heart disease Brother   . Colon cancer Neg Hx     Social History Social History   Tobacco Use  . Smoking status: Former Smoker    Packs/day: 0.50    Years: 25.00    Pack years: 12.50    Types: Cigarettes    Last attempt to quit: 1997    Years since quitting: 22.1  . Smokeless tobacco: Former Systems developer    Types: Chew    Quit date: 53  . Tobacco comment: dipped tobacco during pregancy in 1975  Substance Use Topics  . Alcohol use: No  . Drug use: No     Allergies   Iodinated diagnostic agents   Review of Systems Review of Systems    Constitutional: Negative for fever.  Respiratory: Negative for cough and shortness of breath.   Cardiovascular: Positive for chest pain (  skin and soft tissue swelling L axilla).  Skin: Negative for rash and wound.  All other systems reviewed and are negative.    Physical Exam Updated Vital Signs BP 131/71 (BP Location: Left Arm)   Pulse 71   Temp 98.8 F (37.1 C)   Resp 18   Ht 5\' 7"  (1.702 m)   Wt 108 kg (238 lb)   LMP  (LMP Unknown)   SpO2 94%   BMI 37.28 kg/m   Physical Exam  Constitutional: She appears well-developed and well-nourished. No distress.  HENT:  Head: Normocephalic and atraumatic.  Mouth/Throat: Oropharynx is clear and moist. No oropharyngeal exudate.  Eyes: Pupils are equal, round, and reactive to light. Conjunctivae and EOM are normal. Right eye exhibits no discharge. Left eye exhibits no discharge. No scleral icterus.  Neck: Normal range of motion. Neck supple. No JVD present. No thyromegaly present.  Cardiovascular: Normal rate, regular rhythm, normal heart sounds and intact distal pulses. Exam reveals no gallop and no friction rub.  No murmur heard. Pulmonary/Chest: Effort normal and breath sounds normal. No respiratory distress. She has no wheezes. She has no rales.  Abdominal: Soft. Bowel sounds are normal. She exhibits no distension and no mass. There is no tenderness.  Musculoskeletal: Normal range of motion. She exhibits no edema or tenderness.  Left axilla with a fullness, there is no redness, induration or peau d'orange appearance to the skin, no leakage of fluids, there is tenderness in this area, the patient keeps her arm abducted  Lymphadenopathy:    She has no cervical adenopathy.  Neurological: She is alert. Coordination normal.  Skin: Skin is warm and dry. No rash noted. No erythema.  No rash, surgical wounds healing well  Psychiatric: She has a normal mood and affect. Her behavior is normal.  Nursing note and vitals reviewed.    ED  Treatments / Results  Labs (all labs ordered are listed, but only abnormal results are displayed) Labs Reviewed  CBC WITH DIFFERENTIAL/PLATELET - Abnormal; Notable for the following components:      Result Value   RBC 3.23 (*)    Hemoglobin 9.7 (*)    HCT 30.5 (*)    Platelets 123 (*)    All other components within normal limits    Radiology No results found.  Procedures Procedures (including critical care time)  Medications Ordered in ED Medications  HYDROmorphone (DILAUDID) injection 1 mg (1 mg Intramuscular Given 01/14/18 2256)     Initial Impression / Assessment and Plan / ED Course  I have reviewed the triage vital signs and the nursing notes.  Pertinent labs & imaging results that were available during my care of the patient were reviewed by me and considered in my medical decision making (see chart for details).     On my exam the patient does not have any obvious abscess, the skin is not red, it is not warm, there is no induration, the fluid collection is tender however there is no surrounding warmth or signs of cellulitis or deep tissue infection.  There is no fever, no tachycardia.  ECG shows QT [prolongation - pt informed - compared with prior there is no difference Pt informed - she has had afib in the past - maze procedure, valve replacement - no findings in Cards notes about this - needs close f/u.  Labs normal Pt informed Stable for d/c Pt agreeable.   Final Clinical Impressions(s) / ED Diagnoses   Final diagnoses:  Postoperative hematoma of skin following non-dermatologic  procedure  Prolonged Q-T interval on ECG    ED Discharge Orders    None       Noemi Chapel, MD 01/14/18 Darci Needle    Noemi Chapel, MD 01/30/18 [phone]

## 2018-01-14 NOTE — ED Triage Notes (Signed)
Breast mass removal last weds by Dr Constance Haw  Seen by physician yesterday for wound opening a bit and hard and painful at incision site  Told if more painful or worse to go to ED for eval

## 2018-01-16 LAB — BASIC METABOLIC PANEL
BUN/Creatinine Ratio: 21 (calc) (ref 6–22)
BUN: 23 mg/dL (ref 7–25)
CO2: 31 mmol/L (ref 20–32)
Calcium: 9.3 mg/dL (ref 8.6–10.4)
Chloride: 101 mmol/L (ref 98–110)
Creat: 1.12 mg/dL — ABNORMAL HIGH (ref 0.50–0.99)
Glucose, Bld: 93 mg/dL (ref 65–139)
Potassium: 3.4 mmol/L — ABNORMAL LOW (ref 3.5–5.3)
Sodium: 141 mmol/L (ref 135–146)

## 2018-01-17 ENCOUNTER — Encounter: Payer: Self-pay | Admitting: Family Medicine

## 2018-01-17 ENCOUNTER — Ambulatory Visit (INDEPENDENT_AMBULATORY_CARE_PROVIDER_SITE_OTHER): Payer: Medicaid Other | Admitting: Cardiology

## 2018-01-17 ENCOUNTER — Encounter: Payer: Self-pay | Admitting: Cardiology

## 2018-01-17 ENCOUNTER — Telehealth: Payer: Self-pay | Admitting: Family Medicine

## 2018-01-17 VITALS — BP 140/76 | HR 54 | Ht 67.0 in | Wt 233.0 lb

## 2018-01-17 DIAGNOSIS — G4730 Sleep apnea, unspecified: Secondary | ICD-10-CM | POA: Diagnosis not present

## 2018-01-17 DIAGNOSIS — I050 Rheumatic mitral stenosis: Secondary | ICD-10-CM

## 2018-01-17 DIAGNOSIS — E876 Hypokalemia: Secondary | ICD-10-CM

## 2018-01-17 DIAGNOSIS — Z9889 Other specified postprocedural states: Secondary | ICD-10-CM

## 2018-01-17 DIAGNOSIS — R0602 Shortness of breath: Secondary | ICD-10-CM

## 2018-01-17 MED ORDER — POTASSIUM CHLORIDE ER 10 MEQ PO TBCR
10.0000 meq | EXTENDED_RELEASE_TABLET | Freq: Every day | ORAL | 1 refills | Status: DC
Start: 2018-01-17 — End: 2018-06-02

## 2018-01-17 MED ORDER — POTASSIUM CHLORIDE CRYS ER 20 MEQ PO TBCR: 20.0000 meq | EXTENDED_RELEASE_TABLET | Freq: Every day | ORAL | 0 refills | Status: DC

## 2018-01-17 NOTE — Progress Notes (Signed)
Clinical Summary Toni [name] is a 62 y.o.female seen today for follow up of the following medical problems.  1. Mitral regurgitation - history of previous MV repair at [facility] 02/2015 - Radical Repair with resection of P2 Prolapse, 26 mm Physio II Ring Annuloplasty, Tricuspid Valve Repair (26 mm Ring Annuloplasty, Cox Maze IV Procedure on 02/27/2015 - cath prior to surgery showed no significant CAD - post repair echo 02/2015 showed no MR, mild TR. There are no reported gradients across the valves in the report - has had some recent SOB. Increased DOE over the last few months, at 1/2 block. Subjective abdominal distension. +orthopnea x 3 pillows which is chronic. Reports some weight gain.   02/2015 PFTs normal(on care everywhere) -10/2017echo showed elevated gradients across her repaired MV and TV consistent with post repair stenosis.  - TEE 10/2016 showed restricted motion of posterior MV leaflet with mitral stenosis, mean resting gradient of 8 mmHg.  - 10/2017 echo LVEF 45-50%, mean grad MV 11 mmHg.PASP 58. Moderate RV dysfunction    - mild improvement in SOB since last visit - home weights recently up, pcp increased diuretic. Down 236 to 230 lbs.   2. Afib - reports recent palpitations, typically with activity but can occur at rest. Lasts just a few minutes. Occurs 1-2 times a week - can have sudden dizziness at times.  - has not been on anticoag.  - 06/2016 event monitor without evidence of afib, occasional PVCs.    - occasional palpitations since last visit  3. HTN - compliant with meds   4. Chronic lower back pain - ongoing, has leg pains as well.    5. Breast mass - s/p left partial mastectomy and left axillary dissection.   6. OSA screen - + snoring, no apneic. +daytime somnolence.    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 Congestive heart failure, unspecified    Diastolic  heart failure  . Degeneration of lumbar or lumbosacral intervertebral disc   . Depression   . Diverticulosis   . Dyspnea     chronic multifactorial large component of deconditioning  . Esophageal reflux   . Gout   . History of kidney stones   . Insomnia    Working third shift  . Mitral stenosis    Secondary to under-sized ring annuloplasty following mitral valve repair  . Mitral valve disorders(424.0)    Mild to moderate mitral regurgitation  . Mitral valve insufficiency and aortic valve insufficiency   . Morbid obesity ([place])   . Normocytic anemia   . Other chest pain   . Other dyspnea and respiratory abnormality   . Paroxysmal atrial fibrillation ([name])    Stable no recurrence  . Peripheral edema    chronic  . S/P Maze operation for atrial fibrillation 02/27/2015  . S/P mitral valve repair 02/27/2015   "radical repair" including resection of P2 segment of posterior leaflet with 26 mm Edwards Physio II annuloplasty ring  . S/P tricuspid valve repair 02/27/2015   26 mm annuloplasty ring  . Unspecified essential hypertension      Allergies  Allergen Reactions  . Iodinated Diagnostic Agents Swelling    EYES     Current Outpatient Medications  Medication Sig Dispense Refill  . acetaminophen (TYLENOL) 500 MG tablet Take 1,000 mg by mouth daily as needed for moderate pain or headache.    . albuterol (PROVENTIL HFA;VENTOLIN HFA) 108 (  90 Base) MCG/ACT inhaler Inhale 2 puffs into the lungs every 6 (six) hours as needed for wheezing or shortness of breath. 1 Inhaler 3  . amLODipine (NORVASC) 5 MG tablet Take 1 tablet (5 mg total) by mouth daily. 90 tablet 3  . aspirin EC 81 MG tablet Take 1 tablet (81 mg total) by mouth daily. 90 tablet 3  . cyclobenzaprine (FLEXERIL) 10 MG tablet Take 1 tablet (10 mg total) by mouth 3 (three) times daily as needed for muscle spasms. 30 tablet 0  . doxycycline (VIBRA-TABS) 100 MG tablet Take 1 tablet (100 mg total) by mouth 2 (two) times daily.  (Patient not taking: Reported on 01/14/2018) 20 tablet 0  . FLUoxetine (PROZAC) 40 MG capsule TAKE 1 CAPSULE BY MOUTH EVERY DAY 30 capsule 1  . gabapentin (NEURONTIN) 300 MG capsule Take one tablet in the morning and three tablets in the evening as directed. (Patient taking differently: Take 300-900 mg by mouth See admin instructions. Take one tablet in the morning and three tablets in the evening as directed.) 270 capsule 1  . guaiFENesin (MUCINEX) 600 MG 12 hr tablet Take 600 mg by mouth 2 (two) times daily as needed (congestion).    Marland Kitchen ibuprofen (ADVIL,MOTRIN) 200 MG tablet Take 400-800 mg by mouth daily as needed for headache or moderate pain.     . indomethacin (INDOCIN) 25 MG capsule 1 po q 8 hour prn gout pain (Patient taking differently: Take 25 mg by mouth every 8 (eight) hours as needed for mild pain or moderate pain (FOR GOUT PAIN). ) 20 capsule 1  . linaclotide (LINZESS) 72 MCG capsule Take 1 capsule (72 mcg total) by mouth daily before breakfast. 90 capsule 3  . losartan (COZAAR) 100 MG tablet Take 1 tablet (100 mg total) by mouth daily. 30 tablet 11  . magnesium gluconate (MAGONATE) 500 MG tablet Take 500 mg by mouth daily.    . metoprolol tartrate (LOPRESSOR) 50 MG tablet Take 1 1/2 tablet by mouth twice a day (Patient taking differently: Take 75 mg by mouth 2 (two) times daily. Take 1 1/2 tablet by mouth twice a day) 90 tablet 1  . omeprazole (PRILOSEC) 40 MG capsule Take 1 capsule (40 mg total) by mouth daily. 90 capsule 1  . oxyCODONE (OXY IR/ROXICODONE) 5 MG immediate release tablet Take 1 tablet (5 mg total) by mouth every 4 (four) hours as needed for severe pain or breakthrough pain. 20 tablet 0  . torsemide (DEMADEX) 20 MG tablet Take 1 tablet (20 mg total) by mouth daily. (Patient taking differently: Take 20-40 mg by mouth daily. ) 90 tablet 3  . trolamine salicylate (ASPERCREME) 10 % cream Apply 1 application topically as needed for muscle pain.     No current  facility-administered medications for this visit.      Past Surgical History:  Procedure Laterality Date  . APPENDECTOMY    . AXILLARY LYMPH NODE DISSECTION Left 01/04/2018   Procedure: LEFT AXILLARY LYMPH NODE DISSECTION;  Surgeon: [name], MD;  Location: [facility];  Service: General;  Laterality: Left;  . BREAST BIOPSY Left 2009  . CARDIAC VALVE SURGERY  02/27/2015   Mikel Cella. Mitral Valve Repair. Radical Repair with resection of P2 Prolapse, 26 mm Physio II Ring Annuloplasty, Tricuspid Valve Repair (26 mm Ring Annuloplasty, Cox Maze IV Procedure on 02/27/2015  . CHOLECYSTECTOMY    . COLONOSCOPY  2011   Dr. Laural Golden: 70mm polyp from hepatic flexure (tubular adenoma)  . COLONOSCOPY N/A 11/10/2016  Dr. Gala Romney: Diverticulosis. Next colonoscopy 7 years, January 2025  . ESOPHAGOGASTRODUODENOSCOPY  2005    Dr. Gala Romney : normal esophagus s/p dilation. Small hiatal hernia noted. Felt improved after dilation.   . ESOPHAGOGASTRODUODENOSCOPY N/A 11/10/2016   Dr. Gala Romney: Moderate-sized hiatal hernia, esophagus normal status post empiric dilation for history of dysphagia  . HEEL SPUR SURGERY Right   . MALONEY DILATION N/A 11/10/2016   Procedure: Venia Minks DILATION;  Surgeon: Daneil Dolin, MD;  Location: [facility] SU[facility];  Service: Endoscopy;  Laterality: N/A;  . MINIMALLY INVASIVE TRICUSPID VALVE REPAIR  02/27/2015   [place]  . MITRAL VALVE REPAIR  02/27/2015   [place]  . PARTIAL MASTECTOMY WITH NEEDLE LOCALIZATION Left 01/04/2018   Procedure: LEFT PARTIAL MASTECTOMY STATUS POST NEEDLE LOCALIZATION;  Surgeon: [name], MD;  Location: [facility];  Service: General;  Laterality: Left;  . TEE WITHOUT CARDIOVERSION N/A 10/18/2016   Procedure: TRANSESOPHAGEAL ECHOCARDIOGRAM (TEE);  Surgeon: Arnoldo Lenis, MD;  Location: [facility];  Service: Endoscopy;  Laterality: N/A;  . TOTAL ABDOMINAL HYSTERECTOMY    . TOTAL KNEE ARTHROPLASTY Right   .  TUBAL LIGATION       Allergies  Allergen Reactions  . Iodinated Diagnostic Agents Swelling    EYES      Family History  Problem Relation Age of Onset  . Hypertension Mother   . Heart disease Mother   . Kidney disease Father   . Stroke Father   . Diabetes Sister   . Kidney disease Sister   . Heart disease Sister   . Hypertension Sister   . Heart disease Brother   . Heart attack Son   . Hypertension Son   . Cancer Paternal Grandmother        Breast Cancer  . Heart disease Brother   . Colon cancer Neg Hx      Social History Ms. Scholes reports that she quit smoking about 22 years ago. Her smoking use included cigarettes. She has a 12.50 pack-year smoking history. She quit smokeless tobacco use about 44 years ago. Her smokeless tobacco use included chew. Ms. Arentz reports that she does not drink alcohol.   Review of Systems CONSTITUTIONAL: No weight loss, fever, chills, weakness or fatigue.  HEENT: Eyes: No visual loss, blurred vision, double vision or yellow sclerae.No hearing loss, sneezing, congestion, runny nose or sore throat.  SKIN: No rash or itching.  CARDIOVASCULAR: per hpi RESPIRATORY: per hpi GASTROINTESTINAL: No anorexia, nausea, vomiting or diarrhea. No abdominal pain or blood.  GENITOURINARY: No burning on urination, no polyuria NEUROLOGICAL: No headache, dizziness, syncope, paralysis, ataxia, numbness or tingling in the extremities. No change in bowel or bladder control.  MUSCULOSKELETAL: No muscle, back pain, joint pain or stiffness.  LYMPHATICS: No enlarged nodes. No history of splenectomy.  PSYCHIATRIC: No history of depression or anxiety.  ENDOCRINOLOGIC: No reports of sweating, cold or heat intolerance. No polyuria or polydipsia.  Marland Kitchen   Physical Examination Vitals:   01/17/18 1126  BP: 140/76  Pulse: (!) 54  SpO2: 96%   Vitals:   01/17/18 1126  Weight: 233 lb (105.7 kg)  Height: 5\' 7"  (1.702 m)    Gen: resting comfortably, no acute  distress HEENT: no scleral icterus, pupils equal round and reactive, no palptable cervical adenopathy,  CV: RRR, no m/r/g, no jvd Resp: Clear to auscultation bilaterally GI: abdomen is soft, non-tender, non-distended, normal bowel sounds, no hepatosplenomegaly MSK: extremities are warm, no edema.  Skin:  warm, no rash Neuro:  no focal deficits Psych: appropriate affect   Diagnostic Studies 08/2016 echo Study Conclusions  - Left ventricle: The cavity size was normal. Wall thickness was increased in a pattern of mild LVH. Systolic function was normal. The estimated ejection fraction was in the range of 50% to 55%. Wall motion was normal; there were no regional wall motion abnormalities. The study was not technically sufficient to allow evaluation of LV diastolic dysfunction due to atrial fibrillation. - Aortic valve: Mildly calcified annulus. Trileaflet; mildly thickened leaflets. There was mild regurgitation. Valve area (VTI): 2.47 cm^2. Valve area (Vmax): 2.47 cm^2. Valve area (Vmean): 2.43 cm^2. - Mitral valve: The is a MV anular ring present s/p mitral valve repair. There is an increased gradient across the MV. Mean gradient 12 mmHg. - Left atrium: The atrium was severely dilated. - Right ventricle: The cavity size was mildly dilated. Systolic function was mildly to moderately reduced. - Right atrium: The atrium was mildly dilated. - Tricuspid valve: A TV anular ring is present. Mean gradient across the TV of 7 mmHg. - Pulmonary arteries: Systolic pressure was mildly increased. PA peak pressure: 31 mm Hg (S). - Technically adequate study.   10/2016 TEE Study Conclusions  - Left ventricle: The cavity size was normal. Wall thickness was normal. Systolic function was normal. The estimated ejection fraction was in the range of 55% to 60%. - Mitral valve: The posterior leaflet is moderately thickened with severely restricted motion.  There is a moderate to severe gradient across the MV of 8 mmHg. There was mild regurgitation. Mean gradient (D): 8 mm Hg. - Left atrium: The atrium was severely dilated. - Right atrium: No evidence of thrombus in the atrial cavity or appendage.   10/2017 echo Study Conclusions  - Left ventricle: The cavity size was normal. Wall thickness was   increased in a pattern of mild LVH. Systolic function was mildly   reduced. The estimated ejection fraction was in the range of 45%   to 50%. Diffuse hypokinesis. The study is not technically   sufficient to allow evaluation of LV diastolic function. - Aortic valve: Mildly calcified annulus. Trileaflet. Mean gradient   (S): 3 mm Hg. Valve area (VTI): 2.21 cm^2. - Mitral valve: The findings are consistent with severe stenosis.   There was trivial regurgitation. Mean gradient (D): 11 mm Hg.   Planimetered valve area: 0.9 cm^2. Valve area by continuity   equation (using LVOT flow): 0.68 cm^2. - Left atrium: The atrium was severely dilated. - Right ventricle: The cavity size was mildly dilated. Systolic   function was moderately reduced. - Right atrium: The atrium was mildly dilated. Central venous   pressure (est): 15 mm Hg. - Atrial septum: No defect or patent foramen ovale was identified. - Tricuspid valve: There was mild regurgitation. - Pulmonary arteries: PA peak pressure: 58 mm Hg (S). - Pericardium, extracardiac: There was no pericardial effusion.  Impressions:  - Mild LVH with LVEF approximately 45-50%. Indeterminate diastolic   function. Severe left atrial enlargement. Findings consistent   with severe mitral stenosis as detailed above. Moderately reduced   right ventricular contraction. Mild tricuspid regurgitation with   elevated PASP estimated 58 mmHg.     Assessment and Plan  1. Valvular heart disease/SOB - previous MV and TV repair - imaging has shown elevated gradient across her repaired MV. Seen by CT surgery  previously, recs for continued medical management - continue to monitor at this time.   2. Afib -  she is s/p Maze procedure. Recent monitor did not show any recurrence of afib. Recent EKGs reviewed show SR - continue to monitor at this time.     3. HTN - elevated in clinic, follow with continued diuresis - low K, she will take KCL 45mEq daily x 3 days.   4. OSA screen - signs and symptoms of OSA, refer to Dr Luan Pulling.    F/u 6 months       Arnoldo Lenis, M.D.

## 2018-01-17 NOTE — Telephone Encounter (Signed)
Please call patient and advised that her potassium is still low.  Please have her start potassium 10 mEq, 1 p.o. daily.  I have sent this medication into her pharmacy.  She needs to return to the lab in 2 weeks for potassium check.  Advised her that it is necessary to maintain her potassium within the normal range, closer to 4, for her heart to function best.  Please let me know if she has any questions.

## 2018-01-17 NOTE — Patient Instructions (Signed)
Medication Instructions:  TAKE POTASSIUM 20 MEQ FOR NEXT 3 DAYS THEN STOP   Labwork: NONE  Testing/Procedures: NONE  Follow-Up: Your physician wants you to follow-up in: 6 MONTHS.  You will receive a reminder letter in the mail two months in advance. If you don't receive a letter, please call our office to schedule the follow-up appointment.   Any Other Special Instructions Will Be Listed Below (If Applicable).  You have been referred to DR. HAWKINS FOR SLEEP APNEA     If you need a refill on your cardiac medications before your next appointment, please call your pharmacy.

## 2018-01-18 ENCOUNTER — Ambulatory Visit: Payer: Medicaid Other | Admitting: Family Medicine

## 2018-01-18 NOTE — Telephone Encounter (Signed)
Left message per DPR 

## 2018-01-19 ENCOUNTER — Encounter: Payer: Self-pay | Admitting: Internal Medicine

## 2018-01-19 ENCOUNTER — Ambulatory Visit (INDEPENDENT_AMBULATORY_CARE_PROVIDER_SITE_OTHER): Payer: Self-pay | Admitting: General Surgery

## 2018-01-19 ENCOUNTER — Ambulatory Visit: Payer: Medicaid Other | Admitting: Internal Medicine

## 2018-01-19 ENCOUNTER — Encounter: Payer: Self-pay | Admitting: General Surgery

## 2018-01-19 VITALS — BP 131/60 | HR 63 | Temp 97.8°F | Resp 18 | Ht 67.0 in | Wt 236.0 lb

## 2018-01-19 DIAGNOSIS — R05 Cough: Secondary | ICD-10-CM

## 2018-01-19 DIAGNOSIS — R0609 Other forms of dyspnea: Secondary | ICD-10-CM

## 2018-01-19 DIAGNOSIS — I050 Rheumatic mitral stenosis: Secondary | ICD-10-CM | POA: Diagnosis not present

## 2018-01-19 DIAGNOSIS — R0600 Dyspnea, unspecified: Secondary | ICD-10-CM

## 2018-01-19 DIAGNOSIS — R058 Other specified cough: Secondary | ICD-10-CM

## 2018-01-19 DIAGNOSIS — L7634 Postprocedural seroma of skin and subcutaneous tissue following other procedure: Secondary | ICD-10-CM

## 2018-01-19 DIAGNOSIS — T148XXA Other injury of unspecified body region, initial encounter: Secondary | ICD-10-CM

## 2018-01-19 MED ORDER — OXYCODONE HCL 5 MG PO TABS: 5.0000 mg | ORAL_TABLET | ORAL | 0 refills | Status: DC | PRN

## 2018-01-19 NOTE — Patient Instructions (Signed)
Keep bandaid in place. Keep the gauze and ace wrap in place tonight, can remove an replace tomorrow.    Seroma A seroma is a collection of fluid on the body that looks like swelling or a mass. Seromas form where tissue has been injured or cut. Seromas vary in size. Some are small and painless. Others may become large and cause pain or discomfort. Many seromas go away on their own as the fluid is naturally absorbed by the body, and some seromas need to be drained. What are the causes? Seromas form as the result of damage to tissue or the removal of tissue. This tissue damage may occur during surgery or because of an injury or trauma. When tissue is disrupted or removed, empty space is created. The body's natural defense system (immune system) causes fluid to enter the empty space and form a seroma. What are the signs or symptoms? Symptoms of this condition include:  Swelling at the site of a surgical cut (incision) or an injury.  Drainage of clear fluid at the surgery or injury site.  Discomfort or pain.  How is this diagnosed? This condition is diagnosed based on your symptoms, your medical history, and a physical exam. During the exam, your health care provider will press on the seroma. You may also have tests, including:  Blood tests.  Imaging tests, such as an ultrasound or CT scan.  How is this treated? Some seromas go away (resolve) on their own. Your health care provider may monitor you to make sure the seroma does not cause any complications. If your seroma does not resolve on its own, treatment may include:  Using a needle to drain the fluid from the seroma (needle aspiration).  Inserting a flexible tube (catheter) to drain the fluid.  Applying a bandage (dressing), such as an elastic bandage or binder.  Antibiotic medicines, if the seroma becomes infected.  In rare cases, surgery may be done to remove the seroma and repair the area. Follow these instructions at  home:  If you were prescribed an antibiotic medicine, take it as told by your health care provider. Do not stop taking the antibiotic even if you start to feel better.  Return to your normal activities as told by your health care provider. Ask your health care provider what activities are safe for you.  Take over-the-counter and prescription medicines only as told by your health care provider.  Check your seroma every day for signs of infection. Check for: ? Redness or pain. ? Fluid or pus. ? More swelling. ? Warmth.  Keep all follow-up visits as told by your health care provider. This is important. Contact a health care provider if:  You have a fever.  You have redness or pain at the site of the seroma.  You have fluid or pus coming from the seroma.  Your seroma is more swollen or is getting bigger.  Your seroma is warm to the touch. This information is not intended to replace advice given to you by your health care provider. Make sure you discuss any questions you have with your health care provider. Document Released: 02/19/2013 Document Revised: 08/06/2016 Document Reviewed: 08/06/2016 Elsevier Interactive Patient Education  [employer].

## 2018-01-19 NOTE — Progress Notes (Signed)
[facility] Note   HPI:  62 y.o. Female presents to clinic for post-op follow-up evaluation. She has been seen by Dr. Arnoldo Morale last week and was doing well but then went to the ED with swelling of the axilla and concern for seroma/ hematoma formation. She returns to clinic today for me to examine her. She has been in significant pain and does not feel like she can move her arm. This has been keeping her up at night, and the area is very sore.    Review of Systems:  Pain and pressure in the left axilla  Swelling in the left axilla No drainage or redness in the left axilla Pain extending into the left arm with movement  All other review of systems: otherwise negative   Vital Signs:  BP 131/60   Pulse 63   Temp 97.8 F (36.6 C)   Resp 18   Ht 5\' 7"  (1.702 m)   Wt 236 lb (107 kg)   LMP  (LMP Unknown)   BMI 36.96 kg/m    Physical Exam:  Physical Exam  Constitutional: She is well-developed, well-nourished, and in no distress.  HENT:  Head: Normocephalic.  Eyes: Pupils are equal, round, and reactive to light.  Neck: Normal range of motion.  Cardiovascular: Normal rate.  Pulmonary/Chest: Effort normal.  Left breast incision c/d/i without drainage or erythema, left axilla incision c/d/i without drainage or erythema, swollen and tender axilla with fluctuance, no signs of infection  Abdominal: Soft. She exhibits no distension. There is no tenderness.  Vitals reviewed.  Procedure: Drainage of left axilla seroma  Preprocedure diagnosis: left axilla seroma Post procedure diagnosis: same   Indications: Ms. Alameda is a 62 yo s/p breast partial mastectomy of the left breast and axillary node dissection for a mass and adenopathy that had discordant pathology on core needle biopsy.  She has developed a seroma in the area after her drain became clogged and had to be removed early in her post operative course.   Procedure: The left axilla was prepped with betadine preparation.  A 18 gauge needle was used to aspirate 300cc of clear straw colored fluid consistent with a seroma.  This was all done with sterile gloves and equipment.  The area was cleaned and a bandaid was applied. A pressure dressing was applied to the armpit with an ACE wrap.  She tolerated the procedure well. After the betadine was used, she told me that she had an iodine allergy but that this was to IV contrast. She is able to eat seafood without issues, and has never had an issue with any skin preparations. The betadine was cleaned completely from the skin, and there was no signs of any allergic reaction.   Laboratory studies: None   Imaging: None    Assessment:  62 y.o. yo Female with a left axillary seroma s/p axillary node dissection. Her pathology remarkably was benign despite with multiple enlarged lymph nodes in her axilla and the breast mass was benign.   Plan:  - Will see next week to check to see if seroma re-acculumated, no evidence at this time that this is a chyle leak  - ACE wrap applied to armpit to help with sealing the area, patient instructed to leave on today and to reapply tomorrow  - Immediate improvement in her pain  - Refill of roxicodone provided for pain   Future Appointments  Date Time Provider [place]  01/24/2018 10:30 AM [name], MD [facility] None  05/10/2018 10:30 AM Mahala Menghini, PA-C [facility]    All of the above recommendations were discussed with the patient, and all of patient's questions were answered to her expressed satisfaction.  Curlene Labrum, MD [facility] [address] [place], [place] [address] [phone] (office)

## 2018-01-19 NOTE — Patient Instructions (Signed)
You do not have a lung problem that I can detect but between your weight/ conditioning and mitral valve disease you are going to continue to be short of breath with activity and need close follow up   Sleep evaluation with cardiology and refer to sleep medicine as needed     If you are satisfied with your treatment plan,  let your doctor know and he/she can either refill your medications or you can return here when your prescription runs out.     If in any way you are not 100% satisfied,  please tell us.  If 100% better, tell your friends!  Pulmonary follow up is as needed

## 2018-01-19 NOTE — Progress Notes (Signed)
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   History of Present Illness  11/24/2017 1st [place] office visit/ Toni [name]   Chief Complaint  Patient presents with  . Pulmonary Consult    Referred by Toni Toni [name]. Pt c/o cough since Dec 2018. She is coughing up light green to white sputum.  She has noticed smoke and dust are things that can trigger the cough. She states she has SOB with doing light housework. She has to sleep propped up on 2-3 pillows or she has trouble breathing.   She is using her albuterol inhaler 2 x per day on average.   indolent onset over a year prior to OV  Urge to clear the throat assoc with dysphagia and intermittently coughing up min amt of green mucus some better on abx but never gone with sensation of something stuck in throat she can't cough up  Already on omeprazole 40 mg ac  Qd  Can't sleep on back to sob and back pain  Some better p saba  rec Stop lisinopril and your cough problem should gradually resolve over several weeks Start ibesartan 150 mg one daily  Continue omeprazole 40 mg Take 30-60 min before first meal of the day and add pepcid 20 mg at bedtime until better then stop GERD diet   NP f/u 12/07/17 Patient Instructions  Continue on Losartan 100mg  daily , may take at dinner time with food.  Follow up with Primary MD for blood pressure next month as planned.  Would avoid ACE Iinhibitors , like Lisinopril if possible as may make cough worse.  Delsym 2 tsp every 12hr as needed .  May try use Zyrtec 10mg  At bedtime  As needed  Drainage .  Follow up with Toni [name]  In 6-8 weeks and As needed       01/19/2018  f/u ov/Toni [name] re: doe no change /  uacs  much better  Chief Complaint  Patient presents with  . Follow-up    Breathing is unchanged.  She uses her albuterol inhaler 1 x daily on average.   Dyspnea:  Legs give out first  Cough: 90% better/ still some sense of dysphagia  Sleep: ok  2 pillows  SABA use:  If over does it   No obvious day to day or daytime variability or assoc excess/ purulent sputum or mucus plugs or hemoptysis or cp or chest tightness, subjective wheeze or overt sinus or hb symptoms. No unusual exposure hx or h/o childhood pna/ asthma or knowledge of premature birth.  Sleeping ok 2 pillows  without nocturnal  or early am exacerbation  of respiratory  c/o's or need for noct saba. Also denies any obvious fluctuation of symptoms with weather or environmental changes or other aggravating or alleviating factors except as outlined above   Current Allergies, Complete Past Medical History, Past Surgical History, Family History, and Social History were reviewed in [employer] record.  ROS  The following are not active complaints unless bolded Hoarseness, sore throat, dysphagia, dental problems, itching, sneezing,  nasal congestion or discharge of excess mucus or purulent secretions, ear ache,   fever, chills, sweats,  unintended wt loss or wt gain, classically pleuritic or exertional cp,  orthopnea pnd or leg swelling, presyncope, palpitations, abdominal pain, anorexia, nausea, vomiting, diarrhea  or change in bowel habits or change in bladder habits, change in stools or change in urine, dysuria, hematuria,  rash, arthralgias, visual complaints, headache, numbness, weakness or ataxia or problems with walking or coordination,  change in mood/affect or memory.        Current Meds  Medication Sig  . acetaminophen (TYLENOL) 500 MG tablet Take 1,000 mg by mouth daily as needed for moderate pain or headache.  . albuterol (PROVENTIL HFA;VENTOLIN HFA) 108 (90 Base) MCG/ACT inhaler Inhale 2 puffs into the lungs  every 6 (six) hours as needed for wheezing or shortness of breath.  Marland Kitchen amLODipine (NORVASC) 5 MG tablet Take 1 tablet (5 mg total) by mouth daily.  Marland Kitchen aspirin EC 81 MG tablet Take 1 tablet (81 mg total) by mouth daily.  . cyclobenzaprine (FLEXERIL) 10 MG tablet Take 1 tablet (10 mg total) by mouth 3 (three) times daily as needed for muscle spasms.  Marland Kitchen FLUoxetine (PROZAC) 40 MG capsule TAKE 1 CAPSULE BY MOUTH EVERY DAY  . gabapentin (NEURONTIN) 300 MG capsule Take one tablet in the morning and three tablets in the evening as directed. (Patient taking differently: Take 300-900 mg by mouth See admin instructions. Take one tablet in the morning and three tablets in the evening as directed.)  . guaiFENesin (MUCINEX) 600 MG 12 hr tablet Take 600 mg by mouth 2 (two) times daily as needed (congestion).  Marland Kitchen ibuprofen (ADVIL,MOTRIN) 200 MG tablet Take 400-800 mg by mouth daily as needed for headache or moderate pain.   . indomethacin (INDOCIN) 25 MG capsule 1 po q 8 hour prn gout pain (Patient taking differently: Take 25 mg by mouth every 8 (eight) hours as needed for mild pain or moderate pain (FOR GOUT PAIN). )  . linaclotide (LINZESS) 72 MCG capsule Take 1 capsule (72 mcg total) by mouth daily before breakfast.  . losartan (COZAAR) 100 MG tablet Take 1 tablet (100 mg total) by mouth daily.  . magnesium gluconate (MAGONATE) 500 MG tablet Take 500 mg by mouth daily.  . metoprolol tartrate (LOPRESSOR) 50 MG tablet Take 1 1/2 tablet by mouth twice a day (Patient taking differently: Take 75 mg by mouth 2 (two) times daily. Take 1 1/2 tablet by mouth twice a day)  . omeprazole (PRILOSEC) 40 MG capsule Take 1 capsule (40 mg total) by mouth daily.  . potassium chloride (K-DUR) 10 MEQ tablet Take 1 tablet (10 mEq total) by mouth daily.  Marland Kitchen torsemide (DEMADEX) 20 MG tablet Take 1 tablet (20 mg total) by mouth daily. (Patient taking differently: Take 20-40 mg by mouth daily. )  . trolamine salicylate (ASPERCREME) 10 %  cream Apply 1 application topically as needed for muscle pain.  . [DISCONTINUED] oxyCODONE (OXY IR/ROXICODONE) 5 MG immediate release tablet Take 1 tablet (5 mg total) by mouth every 4 (four) hours as needed for severe pain or breakthrough pain.                Objective:   Physical Exam    amb bf nad  ? Seroma L axilla size of orange    01/19/2018      235   11/24/17 235 lb (106.6 kg)  11/15/17 232 lb (105.2 kg)  11/10/17 239 lb 3.2 oz (108.5 kg)     Vital signs reviewed - Note on arrival 02 sats  92%  on RA       HEENT: nl dentition, turbinates bilaterally, and oropharynx. Nl external ear canals without cough reflex   NECK :  without JVD/Nodes/TM/ nl carotid upstrokes bilaterally   LUNGS: no acc muscle use,  Nl contour chest which is clear to A and P bilaterally without cough on insp or exp maneuvers   CV:  RRR  no s3 or murmur or increase in P2, and trace bilateral ankle pitting   ABD:  obese nontender with limited  inspiratory excursion in the supine position. No bruits or organomegaly appreciated, bowel sounds nl  MS:  Nl gait/ ext warm without deformities, calf tenderness, cyanosis or clubbing No obvious joint restrictions   SKIN: warm and dry without lesions    NEURO:  alert, approp, nl sensorium with  no motor or cerebellar deficits apparent.          Assessment:

## 2018-01-22 ENCOUNTER — Encounter: Payer: Self-pay | Admitting: Cardiology

## 2018-01-24 ENCOUNTER — Ambulatory Visit (INDEPENDENT_AMBULATORY_CARE_PROVIDER_SITE_OTHER): Payer: Self-pay | Admitting: General Surgery

## 2018-01-24 ENCOUNTER — Ambulatory Visit: Payer: Medicaid Other | Admitting: General Surgery

## 2018-01-24 ENCOUNTER — Encounter: Payer: Self-pay | Admitting: Internal Medicine

## 2018-01-24 ENCOUNTER — Encounter: Payer: Self-pay | Admitting: General Surgery

## 2018-01-24 VITALS — BP 133/67 | HR 73 | Temp 97.3°F | Resp 18 | Ht 67.0 in | Wt 233.0 lb

## 2018-01-24 DIAGNOSIS — L7634 Postprocedural seroma of skin and subcutaneous tissue following other procedure: Secondary | ICD-10-CM

## 2018-01-24 NOTE — Assessment & Plan Note (Signed)
Fu per cards/ T surgery planned

## 2018-01-24 NOTE — Assessment & Plan Note (Signed)
Try off acei  11/24/2017    Although even in retrospect it may not be clear the ACEi contributed to the pt's symptoms,  Pt improved off them and adding them back at this point or in the future would risk confusion in interpretation of non-specific respiratory symptoms to which this patient is prone  ie  Better not to muddy the waters here.

## 2018-01-24 NOTE — Progress Notes (Signed)
[facility] Note   HPI:  62 y.o. Female presents to clinic for follow-up evaluation of her left axillary seroma s/p axillary dissection. She said the seroma reaccumulated over the weekend. She kept the wrap on for a day as instructed. Denies any fevers or chills or drainage.   Review of Systems:  + axillary pain on left Swelling of axilla on left All other review of systems: otherwise negative   Vital Signs:  BP 133/67   Pulse 73   Temp (!) 97.3 F (36.3 C)   Resp 18   Ht 5\' 7"  (1.702 m)   Wt 233 lb (105.7 kg)   LMP  (LMP Unknown)   BMI 36.49 kg/m    Physical Exam:  Physical Exam  Constitutional: She is well-developed, well-nourished, and in no distress.  HENT:  Head: Normocephalic.  Eyes: Pupils are equal, round, and reactive to light.  Neck: Normal range of motion.  Cardiovascular: Normal rate.  Pulmonary/Chest: Effort normal.  Left axillary wound without erythema or drainage, bulging fluctuant area, aspirated 240cc of serous straw colored fluid   Abdominal: Soft.  Vitals reviewed.   Procedure: Drainage of left axilla seroma  Preprocedure diagnosis: Left axilla seroma Post procedure diagnosis: Same   Indications: Ms. Osinski is a 62 yo s/p breast partial mastectomy of the left breast and axillary node dissection for a mass and adenopathy that had discordant pathology on core needle biopsy.  She has developed a seroma in the area after her drain became clogged and had to be removed early in her post operative course.  She underwent aspiration of the seroma last week, and returns with a reaccumulation.   Procedure: The left axilla was prepped with non betadine preparation. A 18 gauge needle was used to aspirate 240 cc of clear straw colored fluid consistent with a seroma.  This was all done with sterile gloves and equipment.  The area was cleaned and a bandaid was applied. A pressure dressing was applied to the armpit with an ACE wrap.  She tolerated the  procedure well.  Tthere was no signs of any infection at the axilla.   Assessment:  62 y.o. yo Female with a post operative seroma in the left axilla. The fluid remains straw colored in nature. I have wrapped the armpit more today to compress the area, and have asked her to leave this in place for 2 days.   Plan:  - Follow up Thursday for recheck of the axilla   - Still does not look like a chyle leak   All of the above recommendations were discussed with the patient, and all of patient's questions were answered to her expressed satisfaction.  Curlene Labrum, MD [facility] [address] [place], [place] [address] [phone] (office)

## 2018-01-24 NOTE — Assessment & Plan Note (Signed)
PFT's  06/21/17    FEV1 1.95 (85 % ) ratio 89   p no % improvement from saba p ?  prior to study with DLCO  45  % corrects to 72 % for alv volume  Echo 07/05/17 Mild LVH with LVEF approximately 45-50%. Indeterminate diastolic   function. Severe left atrial enlargement. Findings consistent   with severe mitral stenosis as detailed above. Moderately reduced   right ventricular contraction. Mild tricuspid regurgitation with   elevated PASP estimated 58 mmHg.  - trial off acei 11/24/2017 >>> much better 01/19/2018  - 01/19/2018   Walked RA x one lap @ 185 stopped due to  Legs tired, aching esp on L , no sob or desats    I had an extended final summary discussion with the patient reviewing all relevant studies completed to date and  lasting 15 to 20 minutes of a 25 minute visit on the following issues:   1) no evidence of asthma or any lung problem here > favor MS/ obesity/ deconditioning  2) use of saba p "ovrdoes it" and sits down to recover does not mean she has EIA unless it is prevented by pretreatment with saba, which she has never tried but free to do prn   3) pulmonary f/u can be prn.   Each maintenance medication was reviewed in detail including most importantly the difference between maintenance and as needed and under what circumstances the prns are to be used.  Please see AVS for specific  Instructions which are unique to this visit and I personally typed out  which were reviewed in detail in writing with the patient and a copy provided.

## 2018-01-26 ENCOUNTER — Ambulatory Visit (INDEPENDENT_AMBULATORY_CARE_PROVIDER_SITE_OTHER): Payer: Self-pay | Admitting: General Surgery

## 2018-01-26 VITALS — BP 123/60 | HR 64 | Temp 97.1°F | Resp 18 | Ht 67.0 in | Wt 230.0 lb

## 2018-01-26 DIAGNOSIS — L7634 Postprocedural seroma of skin and subcutaneous tissue following other procedure: Secondary | ICD-10-CM

## 2018-01-28 ENCOUNTER — Encounter: Payer: Self-pay | Admitting: General Surgery

## 2018-01-28 NOTE — Progress Notes (Signed)
[facility] Note   HPI:  62 y.o. Female presents to clinic for follow-up evaluation of her seroma in her left axilla. It is not has large today, but she was unable to keep the pressure dressing on as prescribed.   Review of Systems:  No fevers or chills Pain tolerable  All other review of systems: otherwise negative   Vital Signs:  BP 123/60   Pulse 64   Temp (!) 97.1 F (36.2 C)   Resp 18   Ht 5\' 7"  (1.702 m)   Wt 230 lb (104.3 kg)   LMP  (LMP Unknown)   BMI 36.02 kg/m    Physical Exam:  Physical Exam  Constitutional: She is well-developed, well-nourished, and in no distress.  HENT:  Head: Normocephalic.  Eyes: Pupils are equal, round, and reactive to light.  Pulmonary/Chest: Effort normal.  Left axilla, less tense, no erythema or drainage, incision with dermabond but peeling off   Procedure: Drainage of left axilla seroma  Preprocedure diagnosis: Left axilla seroma Post procedure diagnosis: Same   Indications: Ms. Stull is a 62 yo s/p breast partial mastectomy of the left breast and axillary node dissection for a mass and adenopathy that had discordant pathology on core needle biopsy. She has developed a seroma in the area after her drain became clogged and had to be removed early in her post operative course.  She is undergoing aspirations in the clinic.   Procedure: The left axilla was prepped with non betadine preparation. A 18 gauge needle was used to aspirate 120cc of clear straw colored fluid consistent with a seroma. This was all done with sterile gloves and equipment. The area was cleaned and a bandaid was applied. A pressure dressing was applied to the armpit. She tolerated the procedure well.  Tthere was no signs of any infection at the axilla.    Assessment:  62 y.o. yo Female with a seroma in her left axilla. Doing fair. Less drained today.   Plan:   Future Appointments  Date Time Provider [place]  02/02/2018  1:15 PM  [name], MD [facility] None  05/10/2018 10:30 AM Mahala Menghini, PA-C [facility]   Will plan for serial drainage, and if need to may do sclerosing agent versus going back to OR for more definite closer, assess for lymphatics, at this time is serous in nature    All of the above recommendations were discussed with the patient, and all of patient's questions were answered to her expressed satisfaction.  Curlene Labrum, MD [facility] [address] [place], [place] [address] [phone] (office)

## 2018-02-02 ENCOUNTER — Encounter: Payer: Self-pay | Admitting: General Surgery

## 2018-02-02 ENCOUNTER — Other Ambulatory Visit: Payer: Self-pay | Admitting: Family Medicine

## 2018-02-02 ENCOUNTER — Ambulatory Visit (INDEPENDENT_AMBULATORY_CARE_PROVIDER_SITE_OTHER): Payer: Self-pay | Admitting: General Surgery

## 2018-02-02 VITALS — BP 129/70 | HR 69 | Temp 96.9°F | Resp 18 | Ht 67.0 in | Wt 229.0 lb

## 2018-02-02 DIAGNOSIS — T148XXA Other injury of unspecified body region, initial encounter: Secondary | ICD-10-CM

## 2018-02-02 DIAGNOSIS — M545 Low back pain: Principal | ICD-10-CM

## 2018-02-02 DIAGNOSIS — L7634 Postprocedural seroma of skin and subcutaneous tissue following other procedure: Secondary | ICD-10-CM

## 2018-02-02 DIAGNOSIS — G8929 Other chronic pain: Secondary | ICD-10-CM

## 2018-02-02 MED ORDER — OXYCODONE HCL 5 MG PO TABS: 5.0000 mg | ORAL_TABLET | ORAL | 0 refills | Status: DC | PRN

## 2018-02-02 NOTE — Progress Notes (Signed)
[facility] Note   HPI:  62 y.o. Female presents to clinic for follow-up evaluation of of her left axilla seroma.. Patient reports some continued pain and soreness but overall she is good.  Review of Systems:  No fevers or chills No redness at incisions  All other review of systems: otherwise negative   Vital Signs:  BP 129/70   Pulse 69   Temp (!) 96.9 F (36.1 C)   Resp 18   Ht 5\' 7"  (1.702 m)   Wt 229 lb (103.9 kg)   LMP  (LMP Unknown)   BMI 35.87 kg/m    Physical Exam:  Physical Exam  Constitutional: She is well-developed, well-nourished, and in no distress.  HENT:  Head: Normocephalic.  Eyes: Pupils are equal, round, and reactive to light.  Cardiovascular: Normal rate.  Pulmonary/Chest: Effort normal.  Left axilla with some minimal fluctuance, aspirated 25cc of bloody fluid, no erythema around incision, no signs of infeciton  Vitals reviewed.   Laboratory studies: None   Imaging:  None   Assessment:  62 y.o. yo Female with a post operative left axillary seroma. Doing well. Aspirations going well and down to 25cc.   Plan:   Future Appointments  Date Time Provider [place]  02/14/2018 10:00 AM [name], MD [facility] None  05/10/2018 10:30 AM Mahala Menghini, PA-C [facility]  - See for possible repeat aspiration  - Roxicodone refill ordered   Curlene Labrum, MD [facility] [address] [place], [place] [address] [phone] (office)

## 2018-02-03 ENCOUNTER — Other Ambulatory Visit: Payer: Self-pay

## 2018-02-03 DIAGNOSIS — M545 Low back pain: Principal | ICD-10-CM

## 2018-02-03 DIAGNOSIS — G8929 Other chronic pain: Secondary | ICD-10-CM

## 2018-02-03 MED ORDER — GABAPENTIN 300 MG PO CAPS: ORAL_CAPSULE | ORAL | 1 refills | Status: DC

## 2018-02-14 ENCOUNTER — Ambulatory Visit (INDEPENDENT_AMBULATORY_CARE_PROVIDER_SITE_OTHER): Payer: Self-pay | Admitting: General Surgery

## 2018-02-14 ENCOUNTER — Encounter: Payer: Self-pay | Admitting: General Surgery

## 2018-02-14 VITALS — BP 127/66 | HR 72 | Temp 98.4°F | Resp 18 | Ht 67.0 in | Wt 227.0 lb

## 2018-02-14 DIAGNOSIS — L7634 Postprocedural seroma of skin and subcutaneous tissue following other procedure: Secondary | ICD-10-CM

## 2018-02-14 MED ORDER — TRAMADOL HCL 50 MG PO TABS: 50.0000 mg | ORAL_TABLET | Freq: Four times a day (QID) | ORAL | 0 refills | Status: DC | PRN

## 2018-02-14 NOTE — Progress Notes (Signed)
[facility]   HPI:  62 y.o. Female presents to clinic for follow-up evaluation of her left axilla seroma. She reports less swelling but has noticed some red spots around the axilla.   Review of Systems:  Denies any drainage from axilla Denies fevers or chills All other review of systems: otherwise negative   Vital Signs:  BP 127/66   Pulse 72   Temp 98.4 F (36.9 C)   Resp 18   Ht 5\' 7"  (1.702 m)   Wt 227 lb (103 kg)   LMP  (LMP Unknown)   BMI 35.55 kg/m    Physical Exam:  Physical Exam  Constitutional: She is well-developed, well-nourished, and in no distress.  HENT:  Head: Normocephalic.  Eyes: Pupils are equal, round, and reactive to light.  Neck: Normal range of motion.  Cardiovascular: Normal rate.  Pulmonary/Chest: Effort normal.  Left breast incision healed, left axilla incision healed, no erythema or drainage of either; left axilla soft, no drainable collection noted; some petechia/ bruising, no signs of cellulitis   Vitals reviewed.   Laboratory studies: None   Imaging:  None   Assessment:  62 y.o. yo Female with left axilla seroma s/p partial mastectomy and axillary dissection for suspicious mass and adenopathy, all of which were benign. She has undergone several drainage in the clinic, and now the seroma cavity has collapsed. No further drainage needed. The small red spots are petechia and some bruising, this is likely from the collapse of the cavity.   Plan:  - Follow up PRN   - Notify if worsening swelling, changes in redness/ drainage at the axilla  - Still with some pain at night, one time Rx for Tramadol written   All of the above recommendations were discussed with the patient, and all of patient's questions were answered to her expressed satisfaction.  Curlene Labrum, MD [facility] [address] [place], [place] [address] [phone] (office)

## 2018-02-14 NOTE — Patient Instructions (Signed)
Petechiae are pinpoint, round spots that appear on the skin as a result of bleeding. The bleeding causes the petechiae to appear red, brown or purple. Petechiae ([id number]-[id number]) commonly appear in clusters and may look like a rash. Usually flat to the touch, petechiae don't lose color when you press on them. Sometimes they appear on the inner surfaces of the mouth or the eyelids. Petechiae are common and may indicate a number of conditions, ranging from minor to very serious.  Your petechiae are related to bruising around the seroma cavity (fluid cavity) closing up.

## 2018-03-03 ENCOUNTER — Ambulatory Visit (INDEPENDENT_AMBULATORY_CARE_PROVIDER_SITE_OTHER): Payer: Medicaid Other | Admitting: Family Medicine

## 2018-03-03 ENCOUNTER — Other Ambulatory Visit: Payer: Self-pay

## 2018-03-03 ENCOUNTER — Encounter: Payer: Self-pay | Admitting: Family Medicine

## 2018-03-03 VITALS — BP 148/80 | HR 62 | Temp 98.2°F | Resp 16 | Ht 67.0 in | Wt 229.5 lb

## 2018-03-03 DIAGNOSIS — I10 Essential (primary) hypertension: Secondary | ICD-10-CM

## 2018-03-03 DIAGNOSIS — G8929 Other chronic pain: Secondary | ICD-10-CM | POA: Diagnosis not present

## 2018-03-03 DIAGNOSIS — M545 Low back pain: Secondary | ICD-10-CM

## 2018-03-03 DIAGNOSIS — F419 Anxiety disorder, unspecified: Secondary | ICD-10-CM | POA: Diagnosis not present

## 2018-03-03 DIAGNOSIS — E876 Hypokalemia: Secondary | ICD-10-CM

## 2018-03-03 DIAGNOSIS — Z23 Encounter for immunization: Secondary | ICD-10-CM | POA: Diagnosis not present

## 2018-03-03 LAB — BASIC METABOLIC PANEL
BUN: 20 mg/dL (ref 7–25)
CO2: 26 mmol/L (ref 20–32)
Calcium: 9.7 mg/dL (ref 8.6–10.4)
Chloride: 109 mmol/L (ref 98–110)
Creat: 0.96 mg/dL (ref 0.50–0.99)
Glucose, Bld: 83 mg/dL (ref 65–139)
Potassium: 4.1 mmol/L (ref 3.5–5.3)
Sodium: 141 mmol/L (ref 135–146)

## 2018-03-03 MED ORDER — GABAPENTIN 300 MG PO CAPS: 600.0000 mg | ORAL_CAPSULE | Freq: Three times a day (TID) | ORAL | 1 refills | Status: DC

## 2018-03-03 MED ORDER — FLUOXETINE HCL 40 MG PO CAPS: ORAL_CAPSULE | ORAL | 1 refills | Status: DC

## 2018-03-03 NOTE — Progress Notes (Signed)
Patient ID: Toni [name], female    DOB: 1956/03/09, 62 y.o.   MRN: [id number]  Chief Complaint  Patient presents with  . Follow-up    Allergies Iodinated diagnostic agents  Subjective:   Toni [name] is a 62 y.o. female who presents to [facility] today.  HPI Toni [name] presents today for follow-up.  She reports that she is doing well other than having to deal with the hematoma/seroma associated with her breast surgery.  However, she reports that it is improving.  She comes in today for follow-up related to her mood and her nerve pain.  She reports that she will be losing her Medicaid at the end of the month and would like to get refills on her medication.  She reports that she is doing well on the Prozac.  Denies any depression.  Feels like it helps improve her anxiety.  She is sleeping well.  Appetite is good.  Energy level is better.  Requests a refill.  Blood pressure has been running well.  Denies any chest pain.  Her shortness of breath is not worsened.  She is continuing to be seen and evaluated by cardiology and pulmonology.  She reports that she has been taking the gabapentin 1 to 2 pills in the morning and 2 to 3 pills in the evening.  She reports that this helps with the nerve pain in her legs due to her sciatica.  She denies any weakness.  She reports that medication helps her to function better.  She would like a refill.  She feels like she does better when she takes a higher doses of medication and she is not in pain.  She feels like when she is in pain it limits her functional status and she is not as active.  She is urinating well.  Denies weakness in her lower extremities.   She does tell me that she is not taking the indomethacin which is on her medication list.  She reports that she has been taking the potassium 10 mEq a day.  She believes this is helped with her leg cramps.  She denies any palpitations.  Breathing is stable.  Denies any hemoptysis or  sputum.  No fevers, chills, nausea, vomiting, or diarrhea.  She reports that she did not take her diuretic pills today because it makes her have to urinate.  She reports that she will take them when she gets home.    Past Medical History:  Diagnosis Date  . Congestive heart failure, unspecified    Diastolic heart failure  . Degeneration of lumbar or lumbosacral intervertebral disc   . Depression   . Diverticulosis   . Dyspnea     chronic multifactorial large component of deconditioning  . Esophageal reflux   . Gout   . History of kidney stones   . Insomnia    Working third shift  . Mitral stenosis    Secondary to under-sized ring annuloplasty following mitral valve repair  . Mitral valve disorders(424.0)    Mild to moderate mitral regurgitation  . Mitral valve insufficiency and aortic valve insufficiency   . Morbid obesity ([place])   . Normocytic anemia   . Other chest pain   . Other dyspnea and respiratory abnormality   . Paroxysmal atrial fibrillation ([name])    Stable no recurrence  . Peripheral edema    chronic  . S/P Maze operation for atrial fibrillation 02/27/2015  . S/P mitral valve repair 02/27/2015   "radical  repair" including resection of P2 segment of posterior leaflet with 26 mm Edwards Physio II annuloplasty ring  . S/P tricuspid valve repair 02/27/2015   26 mm annuloplasty ring  . Unspecified essential hypertension     Past Surgical History:  Procedure Laterality Date  . APPENDECTOMY    . AXILLARY LYMPH NODE DISSECTION Left 01/04/2018   Procedure: LEFT AXILLARY LYMPH NODE DISSECTION;  Surgeon: [name], MD;  Location: [facility];  Service: General;  Laterality: Left;  . BREAST BIOPSY Left 2009  . CARDIAC VALVE SURGERY  02/27/2015   Mikel Cella. Mitral Valve Repair. Radical Repair with resection of P2 Prolapse, 26 mm Physio II Ring Annuloplasty, Tricuspid Valve Repair (26 mm Ring Annuloplasty, Cox Maze IV Procedure on 02/27/2015  . CHOLECYSTECTOMY    .  COLONOSCOPY  2011   Dr. Laural Golden: 27mm polyp from hepatic flexure (tubular adenoma)  . COLONOSCOPY N/A 11/10/2016   Dr. Gala Romney: Diverticulosis. Next colonoscopy 7 years, January 2025  . ESOPHAGOGASTRODUODENOSCOPY  2005    Dr. Gala Romney : normal esophagus s/p dilation. Small hiatal hernia noted. Felt improved after dilation.   . ESOPHAGOGASTRODUODENOSCOPY N/A 11/10/2016   Dr. Gala Romney: Moderate-sized hiatal hernia, esophagus normal status post empiric dilation for history of dysphagia  . HEEL SPUR SURGERY Right   . MALONEY DILATION N/A 11/10/2016   Procedure: Venia Minks DILATION;  Surgeon: Daneil Dolin, MD;  Location: [facility];  Service: Endoscopy;  Laterality: N/A;  . MINIMALLY INVASIVE TRICUSPID VALVE REPAIR  02/27/2015   [facility]  . MITRAL VALVE REPAIR  02/27/2015   [place]  . PARTIAL MASTECTOMY WITH NEEDLE LOCALIZATION Left 01/04/2018   Procedure: LEFT PARTIAL MASTECTOMY STATUS POST NEEDLE LOCALIZATION;  Surgeon: [name], MD;  Location: [facility];  Service: General;  Laterality: Left;  . TEE WITHOUT CARDIOVERSION N/A 10/18/2016   Procedure: TRANSESOPHAGEAL ECHOCARDIOGRAM (TEE);  Surgeon: Arnoldo Lenis, MD;  Location: [facility];  Service: Endoscopy;  Laterality: N/A;  . TOTAL ABDOMINAL HYSTERECTOMY    . TOTAL KNEE ARTHROPLASTY Right   . TUBAL LIGATION      Family History  Problem Relation Age of Onset  . Hypertension Mother   . Heart disease Mother   . Kidney disease Father   . Stroke Father   . Diabetes Sister   . Kidney disease Sister   . Heart disease Sister   . Hypertension Sister   . Heart disease Brother   . Heart attack Son   . Hypertension Son   . Cancer Paternal Grandmother        Breast Cancer  . Heart disease Brother   . Colon cancer Neg Hx      Social History   Socioeconomic History  . Marital status: Single    Spouse name: Not on file  . Number of children: Not on file  . Years of education: Not  on file  . Highest education level: Not on file  Occupational History  . Not on file  Social Needs  . Financial resource strain: Not on file  . Food insecurity:    Worry: Not on file    Inability: Not on file  . Transportation needs:    Medical: Not on file    Non-medical: Not on file  Tobacco Use  . Smoking status: Former Smoker    Packs/day: 0.50    Years: 25.00    Pack years: 12.50    Types: Cigarettes    Last attempt to quit: 1997  Years since quitting: 22.3  . Smokeless tobacco: Former Systems developer    Types: Chew    Quit date: 12  . Tobacco comment: dipped tobacco during pregancy in 1975  Substance and Sexual Activity  . Alcohol use: No  . Drug use: No  . Sexual activity: Yes    Partners: Male  Lifestyle  . Physical activity:    Days per week: Not on file    Minutes per session: Not on file  . Stress: Not on file  Relationships  . Social connections:    Talks on phone: Not on file    Gets together: Not on file    Attends religious service: Not on file    Active member of club or organization: Not on file    Attends meetings of clubs or organizations: Not on file    Relationship status: Not on file  Other Topics Concern  . Not on file  Social History Narrative   Lives alone or son will stay with her at times. Cooks. Eats well.    Current Outpatient Medications on File Prior to Visit  Medication Sig Dispense Refill  . acetaminophen (TYLENOL) 500 MG tablet Take 1,000 mg by mouth daily as needed for moderate pain or headache.    . albuterol (PROVENTIL HFA;VENTOLIN HFA) 108 (90 Base) MCG/ACT inhaler Inhale 2 puffs into the lungs every 6 (six) hours as needed for wheezing or shortness of breath. 1 Inhaler 3  . amLODipine (NORVASC) 5 MG tablet Take 1 tablet (5 mg total) by mouth daily. 90 tablet 3  . aspirin EC 81 MG tablet Take 1 tablet (81 mg total) by mouth daily. 90 tablet 3  . cyclobenzaprine (FLEXERIL) 10 MG tablet Take 1 tablet (10 mg total) by mouth 3 (three)  times daily as needed for muscle spasms. 30 tablet 0  . guaiFENesin (MUCINEX) 600 MG 12 hr tablet Take 600 mg by mouth 2 (two) times daily as needed (congestion).    Marland Kitchen ibuprofen (ADVIL,MOTRIN) 200 MG tablet Take 400-800 mg by mouth daily as needed for headache or moderate pain.     Marland Kitchen linaclotide (LINZESS) 72 MCG capsule Take 1 capsule (72 mcg total) by mouth daily before breakfast. 90 capsule 3  . losartan (COZAAR) 100 MG tablet Take 1 tablet (100 mg total) by mouth daily. 30 tablet 11  . magnesium gluconate (MAGONATE) 500 MG tablet Take 500 mg by mouth daily.    . metoprolol tartrate (LOPRESSOR) 50 MG tablet Take 1 1/2 tablet by mouth twice a day (Patient taking differently: Take 75 mg by mouth 2 (two) times daily. Take 1 1/2 tablet by mouth twice a day) 90 tablet 1  . omeprazole (PRILOSEC) 40 MG capsule Take 1 capsule (40 mg total) by mouth daily. 90 capsule 1  . oxyCODONE (OXY IR/ROXICODONE) 5 MG immediate release tablet Take 1 tablet (5 mg total) by mouth every 4 (four) hours as needed for severe pain or breakthrough pain. 10 tablet 0  . potassium chloride (K-DUR) 10 MEQ tablet Take 1 tablet (10 mEq total) by mouth daily. 30 tablet 1  . torsemide (DEMADEX) 20 MG tablet Take 1 tablet (20 mg total) by mouth daily. (Patient taking differently: Take 20-40 mg by mouth daily. ) 90 tablet 3  . traMADol (ULTRAM) 50 MG tablet Take 1 tablet (50 mg total) by mouth every 6 (six) hours as needed. 20 tablet 0  . trolamine salicylate (ASPERCREME) 10 % cream Apply 1 application topically as needed for muscle pain.  No current facility-administered medications on file prior to visit.     Review of Systems  Constitutional: Negative for activity change, appetite change and fever.  Eyes: Negative for visual disturbance.  Respiratory: Negative for cough, chest tightness and shortness of breath.   Cardiovascular: Negative for chest pain, palpitations and leg swelling.  Gastrointestinal: Negative for abdominal  pain, nausea and vomiting.  Genitourinary: Negative for dysuria, frequency and urgency.  Musculoskeletal:       She reports that her nerve pain and muscle tenderness are better when she takes the Neurontin.  Skin: Negative for rash.  Neurological: Negative for dizziness, tremors, syncope, speech difficulty, weakness and light-headedness.  Hematological: Negative for adenopathy.  Psychiatric/Behavioral: Negative for behavioral problems, confusion, dysphoric mood, hallucinations and sleep disturbance. The patient is not nervous/anxious.      Objective:   BP (!) 148/80 (BP Location: Left Arm, Patient Position: Sitting, Cuff Size: Normal)   Pulse 62   Temp 98.2 F (36.8 C) (Temporal)   Resp 16   Ht 5\' 7"  (1.702 m)   Wt 229 lb 8 oz (104.1 kg)   LMP  (LMP Unknown)   SpO2 96%   BMI 35.94 kg/m   Physical Exam  Constitutional: She is oriented to person, place, and time. She appears well-developed and well-nourished. No distress.  HENT:  Head: Normocephalic and atraumatic.  Mouth/Throat: No oropharyngeal exudate.  Eyes: Pupils are equal, round, and reactive to light. No scleral icterus.  Neck: Normal range of motion. Neck supple. No JVD present. No tracheal deviation present. No thyromegaly present.  Cardiovascular: Normal rate and regular rhythm.  Murmur heard. Pulmonary/Chest: Effort normal and breath sounds normal. No respiratory distress. She has no wheezes.  Abdominal: She exhibits no mass. There is no guarding.  Neurological: She is alert and oriented to person, place, and time. No cranial nerve deficit.  Skin: Skin is warm and dry. Capillary refill takes less than 2 seconds.  Psychiatric: She has a normal mood and affect. Her behavior is normal. Judgment and thought content normal.  Nursing note and vitals reviewed.    Assessment and Plan  1. Anxiety Stable on current dose of Prozac.  Continue medications as directed.  Medication sent in for refill.  Suicide risks  evaluated and documented in note if present or in the area below.  Patient has protective factors of family and community support.  Patient reports that family believes is behaving rationally. Patient displays problem solving skills.   Patient specifically denies suicide ideation. Patient has access/information to healthcare contacts if situation or mood changes where patient is a risk to self or others or mood becomes unstable.   During the encounter, the patient had good eye contact and firm handshake regarding safety contract and agreement to seek help if mood worsens and not to harm self.   Patient understands the treatment plan and is in agreement. Agrees to keep follow up and call prior or return to clinic if needed.   - FLUoxetine (PROZAC) 40 MG capsule; TAKE 1 CAPSULE BY MOUTH EVERY DAY  Dispense: 90 capsule; Refill: 1  2. HYPOKALEMIA Check levels today.  Continue current potassium supplementation.  Suspect secondary to diuretic use. - Basic metabolic panel  3. Essential hypertension Blood pressure well controlled.  Continue weight loss and monitoring of salt intake. - Basic metabolic panel Lifestyle modifications discussed with patient including a diet emphasizing vegetables, fruits, and whole grains. Limiting intake of sodium to less than 2,400 mg per day.  Recommendations discussed include  consuming low-fat dairy products, poultry, fish, legumes, non-tropical vegetable oils, and nuts; and limiting intake of sweets, sugar-sweetened beverages, and red meat. Discussed following a plan such as the Dietary Approaches to Stop Hypertension (DASH) diet. Patient to read up on this diet.   4. Immunization due Vaccinations ordered. - Tdap vaccine greater than or equal to 7yo IM - Pneumococcal polysaccharide vaccine 23-valent greater than or equal to 2yo subcutaneous/IM- We do not have the pneumonia 23 and stopped today.  Will call patient will get this and so that she can be  vaccinated. 5. Chronic right-sided low back pain without sciatica Continue current medications as directed.  At this time would recommend that we change her dosing to 2 pills by mouth 3 times a day.  She voiced understanding.  We did discuss sedation precautions with this medication. - gabapentin (NEURONTIN) 300 MG capsule; Take 2 capsules (600 mg total) by mouth 3 (three) times daily. TAKE ONE CAPSULE IN THE MORNING AND TWO CAPSULES IN THE EVENING  Dispense: 540 capsule; Refill: 1 Patient counseled in detail regarding the risks of medication. Told to call or return to clinic if develop any worrisome signs or symptoms. Patient voiced understanding.   Patient was told to call with any questions, concerns or worrisome symptoms.  She was encouraged to keep her scheduled visits with surgery, hematology/oncology, and cardiology, and pulmonology. Return in about 3 months (around 06/02/2018) for follow up . Caren Macadam, MD 03/04/2018

## 2018-03-07 ENCOUNTER — Other Ambulatory Visit (HOSPITAL_BASED_OUTPATIENT_CLINIC_OR_DEPARTMENT_OTHER): Payer: Self-pay

## 2018-03-07 DIAGNOSIS — R5383 Other fatigue: Secondary | ICD-10-CM

## 2018-03-07 DIAGNOSIS — R0683 Snoring: Secondary | ICD-10-CM

## 2018-03-07 DIAGNOSIS — G4710 Hypersomnia, unspecified: Secondary | ICD-10-CM

## 2018-03-13 ENCOUNTER — Ambulatory Visit: Payer: Self-pay | Attending: Pulmonary Disease | Admitting: Neurology

## 2018-03-13 DIAGNOSIS — R0683 Snoring: Secondary | ICD-10-CM | POA: Insufficient documentation

## 2018-03-13 DIAGNOSIS — G4733 Obstructive sleep apnea (adult) (pediatric): Secondary | ICD-10-CM | POA: Insufficient documentation

## 2018-03-13 DIAGNOSIS — G4710 Hypersomnia, unspecified: Secondary | ICD-10-CM

## 2018-03-13 DIAGNOSIS — R5383 Other fatigue: Secondary | ICD-10-CM | POA: Insufficient documentation

## 2018-03-29 ENCOUNTER — Telehealth: Payer: Self-pay | Admitting: Family Medicine

## 2018-03-29 NOTE — Telephone Encounter (Signed)
Is calling to confirm appt, also wants to know if we have the pneumonia shots available. If you will check, I told her that we would call her back to schedule a visit

## 2018-03-30 NOTE — Telephone Encounter (Signed)
Spoke with patient and let her know we had both types of pneumonia vaccines in stock. Let her know when her next appt with Dr.Hagler was. I told her she could get her vaccine then if she wanted and she said she would just wait until her appt.

## 2018-04-03 NOTE — Procedures (Signed)
Charlotte A. Merlene Laughter, MD     [email or web address]             NOCTURNAL POLYSOMNOGRAPHY   LOCATION: [facility]  Patient Name: Toni, [name] Date: 03/13/2018 Gender: Female D.O.B: Jun 21, 1956 Age (years): 62 Referring Provider: Sinda Du Height (inches): 67 Interpreting Physician: Phillips Odor MD, ABSM Weight (lbs): 229 RPSGT: Peak, Robert BMI: 36 MRN: [id number] Neck Size: 16.00 <br> <br> CLINICAL INFORMATION Sleep Study Type: Split Night CPAP    Indication for sleep study: Excessive Daytime Sleepiness, Fatigue, Snoring    Epworth Sleepiness Score: 10  SLEEP STUDY TECHNIQUE As per the AASM Manual for the Scoring of Sleep and Associated Events v2.3 (April 2016) with a hypopnea requiring 4% desaturations.  The channels recorded and monitored were frontal, central and occipital EEG, electrooculogram (EOG), submentalis EMG (chin), nasal and oral airflow, thoracic and abdominal wall motion, anterior tibialis EMG, snore microphone, electrocardiogram, and pulse oximetry. Continuous positive airway pressure (CPAP) was initiated when the patient met split night criteria and was titrated according to treat sleep-disordered breathing.  MEDICATIONS Medications self-administered by patient taken the night of the study : N/A  Current Outpatient Medications:  .  acetaminophen (TYLENOL) 500 MG tablet, Take 1,000 mg by mouth daily as needed for moderate pain or headache., Disp: , Rfl:  .  albuterol (PROVENTIL HFA;VENTOLIN HFA) 108 (90 Base) MCG/ACT inhaler, Inhale 2 puffs into the lungs every 6 (six) hours as needed for wheezing or shortness of breath., Disp: 1 Inhaler, Rfl: 3 .  amLODipine (NORVASC) 5 MG tablet, Take 1 tablet (5 mg total) by mouth daily., Disp: 90 tablet, Rfl: 3 .  aspirin EC 81 MG tablet, Take 1 tablet (81 mg total) by mouth daily., Disp: 90 tablet, Rfl: 3 .  cyclobenzaprine (FLEXERIL) 10 MG tablet, Take 1 tablet (10 mg total) by  mouth 3 (three) times daily as needed for muscle spasms., Disp: 30 tablet, Rfl: 0 .  FLUoxetine (PROZAC) 40 MG capsule, TAKE 1 CAPSULE BY MOUTH EVERY DAY, Disp: 90 capsule, Rfl: 1 .  gabapentin (NEURONTIN) 300 MG capsule, Take 2 capsules (600 mg total) by mouth 3 (three) times daily. TAKE ONE CAPSULE IN THE MORNING AND TWO CAPSULES IN THE EVENING, Disp: 540 capsule, Rfl: 1 .  guaiFENesin (MUCINEX) 600 MG 12 hr tablet, Take 600 mg by mouth 2 (two) times daily as needed (congestion)., Disp: , Rfl:  .  ibuprofen (ADVIL,MOTRIN) 200 MG tablet, Take 400-800 mg by mouth daily as needed for headache or moderate pain. , Disp: , Rfl:  .  linaclotide (LINZESS) 72 MCG capsule, Take 1 capsule (72 mcg total) by mouth daily before breakfast., Disp: 90 capsule, Rfl: 3 .  losartan (COZAAR) 100 MG tablet, Take 1 tablet (100 mg total) by mouth daily., Disp: 30 tablet, Rfl: 11 .  magnesium gluconate (MAGONATE) 500 MG tablet, Take 500 mg by mouth daily., Disp: , Rfl:  .  metoprolol tartrate (LOPRESSOR) 50 MG tablet, Take 1 1/2 tablet by mouth twice a day (Patient taking differently: Take 75 mg by mouth 2 (two) times daily. Take 1 1/2 tablet by mouth twice a day), Disp: 90 tablet, Rfl: 1 .  omeprazole (PRILOSEC) 40 MG capsule, Take 1 capsule (40 mg total) by mouth daily., Disp: 90 capsule, Rfl: 1 .  oxyCODONE (OXY IR/ROXICODONE) 5 MG immediate release tablet, Take 1 tablet (5 mg total) by mouth every 4 (four) hours as needed for severe pain or breakthrough pain., Disp: 10 tablet, Rfl: 0 .  potassium chloride (K-DUR) 10 MEQ tablet, Take 1 tablet (10 mEq total) by mouth daily., Disp: 30 tablet, Rfl: 1 .  torsemide (DEMADEX) 20 MG tablet, Take 1 tablet (20 mg total) by mouth daily. (Patient taking differently: Take 20-40 mg by mouth daily. ), Disp: 90 tablet, Rfl: 3 .  traMADol (ULTRAM) 50 MG tablet, Take 1 tablet (50 mg total) by mouth every 6 (six) hours as needed., Disp: 20 tablet, Rfl: 0 .  trolamine salicylate  (ASPERCREME) 10 % cream, Apply 1 application topically as needed for muscle pain., Disp: , Rfl:    RESPIRATORY PARAMETERS Diagnostic  Total AHI (/hr): 67.4 RDI (/hr): 67.4 OA Index (/hr): 0.5 CA Index (/hr): 2.8 REM AHI (/hr): N/A NREM AHI (/hr): 67.4 Supine AHI (/hr): N/A Non-supine AHI (/hr): 67.35 Min O2 Sat (%): 78.0 Mean O2 (%): 87.6 Time below 88% (min): 83.6   Titration  Optimal Pressure (cm):  AHI at Optimal Pressure (/hr): N/A Min O2 at Optimal Pressure (%): 81.0 Supine % at Optimal (%): N/A Sleep % at Optimal (%): N/A   SLEEP ARCHITECTURE The recording time for the entire night was 424.6 minutes.  During a baseline period of 165.2 minutes, the patient slept for 126.5 minutes in REM and nonREM, yielding a sleep efficiency of 76.6%%. Sleep onset after lights out was 21.7 minutes with a REM latency of N/A minutes. The patient spent 29.6%% of the night in stage N1 sleep, 70.4%% in stage N2 sleep, 0.0%% in stage N3 and 0.0%% in REM.    During the titration period of 249.1 minutes, the patient slept for 213.0 minutes in REM and nonREM, yielding a sleep efficiency of 85.5%%. Sleep onset after CPAP initiation was 18.7 minutes with a REM latency of 131.0 minutes. The patient spent 8.0%% of the night in stage N1 sleep, 82.2%% in stage N2 sleep, 0.0%% in stage N3 and 9.9%% in REM.  CARDIAC DATA The 2 lead EKG demonstrated sinus rhythm. The mean heart rate was 100.0 beats per minute. Other EKG findings include: None. LEG MOVEMENT DATA The total Periodic Limb Movements of Sleep (PLMS) were 0. The PLMS index was 0.0.  IMPRESSIONS - Severe obstructive sleep apnea occurred during the diagnostic portion of the study (AHI = 67.4/hour). Conventional CPAP was not successful. A formal titration study with possible servo-ventilation is suggested.    Delano Metz, MD Diplomate, American Board of Sleep Medicine. ELECTRONICALLY SIGNED ON:  04/03/2018, 11:54 AM [facility] PH: [phone]   FX: [phone] [place]

## 2018-04-13 ENCOUNTER — Encounter: Payer: Self-pay | Admitting: Family Medicine

## 2018-04-14 ENCOUNTER — Encounter: Payer: Self-pay | Admitting: Family Medicine

## 2018-04-24 ENCOUNTER — Other Ambulatory Visit (HOSPITAL_BASED_OUTPATIENT_CLINIC_OR_DEPARTMENT_OTHER): Payer: Self-pay

## 2018-04-24 DIAGNOSIS — G4733 Obstructive sleep apnea (adult) (pediatric): Secondary | ICD-10-CM

## 2018-04-25 ENCOUNTER — Other Ambulatory Visit: Payer: Self-pay | Admitting: Family Medicine

## 2018-04-25 DIAGNOSIS — K219 Gastro-esophageal reflux disease without esophagitis: Secondary | ICD-10-CM

## 2018-04-27 ENCOUNTER — Ambulatory Visit: Payer: BLUE CROSS/BLUE SHIELD | Attending: Pulmonary Disease | Admitting: Neurology

## 2018-04-27 DIAGNOSIS — I493 Ventricular premature depolarization: Secondary | ICD-10-CM | POA: Diagnosis not present

## 2018-04-27 DIAGNOSIS — G4733 Obstructive sleep apnea (adult) (pediatric): Secondary | ICD-10-CM | POA: Diagnosis present

## 2018-04-27 DIAGNOSIS — Z79899 Other long term (current) drug therapy: Secondary | ICD-10-CM | POA: Diagnosis not present

## 2018-04-27 DIAGNOSIS — Z7982 Long term (current) use of aspirin: Secondary | ICD-10-CM | POA: Insufficient documentation

## 2018-04-30 ENCOUNTER — Other Ambulatory Visit: Payer: Self-pay | Admitting: Family Medicine

## 2018-04-30 DIAGNOSIS — I10 Essential (primary) hypertension: Secondary | ICD-10-CM

## 2018-05-10 ENCOUNTER — Ambulatory Visit (INDEPENDENT_AMBULATORY_CARE_PROVIDER_SITE_OTHER): Payer: BLUE CROSS/BLUE SHIELD | Admitting: Gastroenterology

## 2018-05-10 ENCOUNTER — Encounter: Payer: Self-pay | Admitting: Gastroenterology

## 2018-05-10 VITALS — BP 118/73 | HR 68 | Temp 99.2°F | Ht 67.0 in | Wt 224.8 lb

## 2018-05-10 DIAGNOSIS — D649 Anemia, unspecified: Secondary | ICD-10-CM | POA: Diagnosis not present

## 2018-05-10 DIAGNOSIS — R748 Abnormal levels of other serum enzymes: Secondary | ICD-10-CM | POA: Diagnosis not present

## 2018-05-10 DIAGNOSIS — K219 Gastro-esophageal reflux disease without esophagitis: Secondary | ICD-10-CM | POA: Diagnosis not present

## 2018-05-10 DIAGNOSIS — R1310 Dysphagia, unspecified: Secondary | ICD-10-CM

## 2018-05-10 DIAGNOSIS — K5900 Constipation, unspecified: Secondary | ICD-10-CM | POA: Diagnosis not present

## 2018-05-10 DIAGNOSIS — R1319 Other dysphagia: Secondary | ICD-10-CM

## 2018-05-10 MED ORDER — RANITIDINE HCL 150 MG PO CAPS: 150.0000 mg | ORAL_CAPSULE | Freq: Every evening | ORAL | 1 refills | Status: DC

## 2018-05-10 NOTE — Patient Instructions (Signed)
1. Continue Linzess 60mcg once daily for constipation. 2. Continue omeprazole 40mg  in the morning. Add Zantac at bedtime. If no improvement in your sensation of something in your throat and swallowing problems after two months of Zantac you can stop the Zantac.  3. Be careful to chew your food thoroughly, use plenty of liquids want eating and taking medications. 4. Please have your labs done at your convenience. 5. Return to the office in 6 months with Dr. Gala Romney.

## 2018-05-10 NOTE — Assessment & Plan Note (Signed)
Typical reflux symptoms well controlled. Has chronic globus and dysphagia to pills/solids with intermittent symptoms. EGD/ED 11/2016 without explanation aside from moderate sized hiatal hernia. BPE with no esophageal stricture 05/2017, minimally prominent cricopharyngeal fold seen.  We briefly discussed possibility of esophageal manometry study to rule out a motility disorder but at this time she is not interested.  She wants to monitor her symptoms.  Her biggest complaint is that of mucus in the back of her throat/postnasal drip sensation.  Likely multifactorial.  Currently being worked up for sleep apnea and has not started her CPAP machine, wants to see if that makes any difference.  Swallowing precautions provided.  We will add Zantac at bedtime for 2 months to see if this helps with any of her globus sensation.  Otherwise if it does not she can stop Zantac after 2 months.  She will continue omeprazole 40 mg daily.  Return to the office in 6 months to see Dr. Gala Romney next time.

## 2018-05-10 NOTE — Progress Notes (Signed)
Primary Care Physician: Caren Macadam, MD  Primary Gastroenterologist:  Garfield Cornea, MD   Chief Complaint  Patient presents with  . Constipation    not taking Linzess d/t ins change  . Dysphagia    sometimes feels like food stops    HPI: Toni [name] is a 62 y.o. female here for follow-up.  She was last seen in January.  Last EGD and colonoscopy in January 2018.  She has a personal history of colon polyps.  On colonoscopy she had diverticulosis.  Next colonoscopy planned for January 2025.  EGD performed for dysphagia.  She had a moderate sized hiatal hernia but the esophagus appeared normal.  Esophagus dilated for history of dysphagia.  Esophageal dilation only helped minimally for dysphagia, fullness in throat.  July 2018 she had a barium esophagram showing mild prominence of the cricopharyngeus fold.  Esophagus widely patent.  No evidence of hiatal hernia or reflux.  10-12 pound weight loss recently which she correlates with taking fluid pill. States she lost 5 pounds of fluid since the weekend. Lately having more problems again with swallowing. Feels like phlegm in the throat. Sometimes has problem with her pills now. Has to take all pills at the same time or "I would never get them down because it would make me sick". They seem to "pile up" if she takes them one at a time. Linzess 15mg daily working but copay is going to be $20 with her new insurance. No melena, brbpr, abdominal pain, heartburn, vomiting. Appetite good.   She has had elevated alk phos on several occasions and normocytic anemia which needs to be followed up on.    Current Outpatient Medications  Medication Sig Dispense Refill  . acetaminophen (TYLENOL) 500 MG tablet Take 1,000 mg by mouth daily as needed for moderate pain or headache.    . albuterol (PROVENTIL HFA;VENTOLIN HFA) 108 (90 Base) MCG/ACT inhaler Inhale 2 puffs into the lungs every 6 (six) hours as needed for wheezing or shortness of breath. 1  Inhaler 3  . amLODipine (NORVASC) 5 MG tablet Take 1 tablet (5 mg total) by mouth daily. 90 tablet 3  . aspirin EC 81 MG tablet Take 1 tablet (81 mg total) by mouth daily. 90 tablet 3  . cyclobenzaprine (FLEXERIL) 10 MG tablet Take 1 tablet (10 mg total) by mouth 3 (three) times daily as needed for muscle spasms. 30 tablet 0  . FLUoxetine (PROZAC) 40 MG capsule TAKE 1 CAPSULE BY MOUTH EVERY DAY 90 capsule 1  . gabapentin (NEURONTIN) 300 MG capsule Take 2 capsules (600 mg total) by mouth 3 (three) times daily. TAKE ONE CAPSULE IN THE MORNING AND TWO CAPSULES IN THE EVENING 540 capsule 1  . guaiFENesin (MUCINEX) 600 MG 12 hr tablet Take 600 mg by mouth 2 (two) times daily as needed (congestion).    .Marland Kitchenibuprofen (ADVIL,MOTRIN) 200 MG tablet Take 400-800 mg by mouth daily as needed for headache or moderate pain.     .Marland Kitchenlosartan (COZAAR) 100 MG tablet Take 1 tablet (100 mg total) by mouth daily. 30 tablet 11  . magnesium gluconate (MAGONATE) 500 MG tablet Take 500 mg by mouth daily.    . metoprolol tartrate (LOPRESSOR) 100 MG tablet TAKE 1 TABLET BY MOUTH TWICE A DAY 180 tablet 0  . omeprazole (PRILOSEC) 40 MG capsule TAKE 1 CAPSULE BY MOUTH EVERY DAY 90 capsule 1  . potassium chloride (K-DUR) 10 MEQ tablet Take 1 tablet (10 mEq total) by  mouth daily. 30 tablet 1  . torsemide (DEMADEX) 20 MG tablet Take 1 tablet (20 mg total) by mouth daily. (Patient taking differently: Take 20-40 mg by mouth daily. ) 90 tablet 3  . trolamine salicylate (ASPERCREME) 10 % cream Apply 1 application topically as needed for muscle pain.     No current facility-administered medications for this visit.     Allergies as of 05/10/2018 - Review Complete 05/10/2018  Allergen Reaction Noted  . Iodinated diagnostic agents Swelling 09/01/2011    ROS:  General: Negative for anorexia, weight loss, fever, chills, fatigue, weakness. ENT: Negative for hoarseness, difficulty swallowing , nasal congestion. CV: Negative for chest  pain, angina, palpitations, dyspnea on exertion, peripheral edema.  Respiratory: Negative for dyspnea at rest, dyspnea on exertion, cough, sputum, wheezing.  GI: See history of present illness. GU:  Negative for dysuria, hematuria, urinary incontinence, urinary frequency, nocturnal urination.  Endo: Negative for unusual weight change.    Physical Examination:   BP 118/73   Pulse 68   Temp 99.2 F (37.3 C) (Oral)   Ht _0  (1.702 m)   Wt 224 lb 12.8 oz (102 kg)   LMP  (LMP Unknown)   BMI 35.21 kg/m   General: Well-nourished, well-developed in no acute distress.  Eyes: No icterus. Mouth: Oropharyngeal mucosa moist and pink , no lesions erythema or exudate. Lungs: Clear to auscultation bilaterally.  Heart: Regular rate and rhythm, no murmurs rubs or gallops.  Abdomen: Bowel sounds are normal, nontender, nondistended, no hepatosplenomegaly or masses, no abdominal bruits or hernia , no rebound or guarding.   Extremities: No lower extremity edema. No clubbing or deformities. Neuro: Alert and oriented x 4   Skin: Warm and dry, no jaundice.   Psych: Alert and cooperative, normal mood and affect.  Labs:  Lab Results  Component Value Date   CREATININE 0.96 03/03/2018   BUN 20 03/03/2018   NA 141 03/03/2018   K 4.1 03/03/2018   CL 109 03/03/2018   CO2 26 03/03/2018   Lab Results  Component Value Date   ALT 17 01/02/2018   AST 23 01/02/2018   ALKPHOS 152 (H) 01/02/2018   BILITOT 0.4 01/02/2018   Lab Results  Component Value Date   WBC 6.7 01/14/2018   HGB 9.7 (L) 01/14/2018   HCT 30.5 (L) 01/14/2018   MCV 94.4 01/14/2018   PLT 123 (L) 01/14/2018    Imaging Studies: No results found.

## 2018-05-10 NOTE — Assessment & Plan Note (Signed)
Normocytic anemia, possibly anemia of chronic disease.  Update labs at this time.  Colonoscopy and upper endoscopy are up-to-date.

## 2018-05-10 NOTE — Assessment & Plan Note (Signed)
Continue Linzess 72 mcg daily.  Samples provided.  Unclear if she would be a candidate for rebate card as she is not quite sure what type of new insurance she does have has.  Return to the office in 6 months or sooner if needed.

## 2018-05-10 NOTE — Assessment & Plan Note (Signed)
Repeat labs at this time °

## 2018-05-10 NOTE — Progress Notes (Signed)
cc'ed to pcp °

## 2018-05-14 NOTE — Procedures (Signed)
Rains A. Merlene Laughter, MD     [email or web address]             NOCTURNAL POLYSOMNOGRAPHY   LOCATION: [facility]   Patient Name: Toni [name], Toni [name] Date: 04/27/2018 Gender: Female D.O.B: [birth year] Age (years): 62 Referring Provider: Sinda Du Height (inches): 41 Interpreting Physician: Phillips Odor MD, ABSM Weight (lbs): 229 RPSGT: Peak, Robert BMI: 36 MRN: [id number] Neck Size: 16.00 <br> <br> CLINICAL INFORMATION The patient is referred for a adaptive servo-ventilator titration study. Most recent polysomnogram dated 03/13/2018 revealed an AHI of 67.4/h and RDI of 67.4/h. Most recent titration study dated 03/13/2018 revealed an AHI of 27.3/h.  MEDICATIONS Medications self-administered by patient taken the night of the study : N/A  Current Outpatient Medications:  .  acetaminophen (TYLENOL) 500 MG tablet, Take 1,000 mg by mouth daily as needed for moderate pain or headache., Disp: , Rfl:  .  albuterol (PROVENTIL HFA;VENTOLIN HFA) 108 (90 Base) MCG/ACT inhaler, Inhale 2 puffs into the lungs every 6 (six) hours as needed for wheezing or shortness of breath., Disp: 1 Inhaler, Rfl: 3 .  amLODipine (NORVASC) 5 MG tablet, Take 1 tablet (5 mg total) by mouth daily., Disp: 90 tablet, Rfl: 3 .  aspirin EC 81 MG tablet, Take 1 tablet (81 mg total) by mouth daily., Disp: 90 tablet, Rfl: 3 .  cyclobenzaprine (FLEXERIL) 10 MG tablet, Take 1 tablet (10 mg total) by mouth 3 (three) times daily as needed for muscle spasms., Disp: 30 tablet, Rfl: 0 .  FLUoxetine (PROZAC) 40 MG capsule, TAKE 1 CAPSULE BY MOUTH EVERY DAY, Disp: 90 capsule, Rfl: 1 .  gabapentin (NEURONTIN) 300 MG capsule, Take 2 capsules (600 mg total) by mouth 3 (three) times daily. TAKE ONE CAPSULE IN THE MORNING AND TWO CAPSULES IN THE EVENING, Disp: 540 capsule, Rfl: 1 .  guaiFENesin (MUCINEX) 600 MG 12 hr tablet, Take 600 mg by mouth 2 (two) times daily as needed (congestion)., Disp: , Rfl:  .   ibuprofen (ADVIL,MOTRIN) 200 MG tablet, Take 400-800 mg by mouth daily as needed for headache or moderate pain. , Disp: , Rfl:  .  losartan (COZAAR) 100 MG tablet, Take 1 tablet (100 mg total) by mouth daily., Disp: 30 tablet, Rfl: 11 .  magnesium gluconate (MAGONATE) 500 MG tablet, Take 500 mg by mouth daily., Disp: , Rfl:  .  metoprolol tartrate (LOPRESSOR) 100 MG tablet, TAKE 1 TABLET BY MOUTH TWICE A DAY, Disp: 180 tablet, Rfl: 0 .  omeprazole (PRILOSEC) 40 MG capsule, TAKE 1 CAPSULE BY MOUTH EVERY DAY, Disp: 90 capsule, Rfl: 1 .  potassium chloride (K-DUR) 10 MEQ tablet, Take 1 tablet (10 mEq total) by mouth daily., Disp: 30 tablet, Rfl: 1 .  ranitidine (ZANTAC) 150 MG capsule, Take 1 capsule (150 mg total) by mouth every evening., Disp: 90 capsule, Rfl: 1 .  torsemide (DEMADEX) 20 MG tablet, Take 1 tablet (20 mg total) by mouth daily. (Patient taking differently: Take 20-40 mg by mouth daily. ), Disp: 90 tablet, Rfl: 3 .  trolamine salicylate (ASPERCREME) 10 % cream, Apply 1 application topically as needed for muscle pain., Disp: , Rfl:    SLEEP STUDY TECHNIQUE As per the AASM Manual for the Scoring of Sleep and Associated Events v2.3 (April 2016) with a hypopnea requiring 4% desaturations.  The channels recorded and monitored were frontal, central and occipital EEG, electrooculogram (EOG), submentalis EMG (chin), nasal and oral airflow, thoracic and abdominal wall motion, anterior tibialis EMG, snore microphone, electrocardiogram, and  pulse oximetry.  RESPIRATORY PARAMETERS Optimal Min IPAP (cm): 7 Optimal Max IPAP (cm): 15 Optimal Min EPAP (cm): 7 Optimal Max EPAP (cm): 15 Optimal Max Pressure (cm): 10 Optimal Min PS (cm): 2 Optimal Max PS (cm): 10 Opitmal Breathing Rate (/min): Auto Overall Min O2 (%): 80.0 Min O2 at Optimal Pressure (%): 80.0 AHI at Optimal (/hr): N/A   SLEEP ARCHITECTURE During a recording time of 407.7 minutes, the patient slept for 331 minutes. Sleep efficiency was  81.2%%. The patient spent 10.7%% of the night in stage N1 sleep, 74.0%% in stage N2 sleep, 0.0%% in stage N3 and 15.26% in REM. Wake after sleep onset (WASO) was 38.5 minutes. Alpha intrusion was PRESENT ABSENT. Supine sleep was 74.62%. The arousal index was 13.8.  LEG MOVEMENT DATA PLM Index (/hr): 0.0 PLM Arousal Index (/hr): 0.0 CARDIAC DATA The 2 lead EKG demonstrated sinus rhythm. The mean heart rate was 57.3 beats per minute. Other EKG findings include: PVCs.   IMPRESSIONS Successful servo-ventilation titration. The settings are of follows: SV Advance EPAP Min 7 and Max 15 cmH2O, Pressure Support Min 2 and Max 10 cmH2O with Max Pressure of 25 cmH2O and Breath Rate of Auto BrPM.  RECOMMENDATIONS - Trial of SV Advance EPAP Min 7 and Max 15 cmH2O, Pressure Support Min 2 and Max 10 cmH2O with Max Pressure of 25 cmH2O and Breath Rate of Auto BrPM.   Delano Metz, MD Diplomate, American Board of Sleep Medicine. ELECTRONICALLY SIGNED ON:  05/14/2018, 3:47 PM [place] PH: [phone]   FX: [phone] [place]

## 2018-05-17 LAB — COMPREHENSIVE METABOLIC PANEL
AG Ratio: 1.5 (calc) (ref 1.0–2.5)
ALT: 22 U/L (ref 6–29)
AST: 25 U/L (ref 10–35)
Albumin: 4.3 g/dL (ref 3.6–5.1)
Alkaline phosphatase (APISO): 133 U/L — ABNORMAL HIGH (ref 33–130)
BUN/Creatinine Ratio: 20 (calc) (ref 6–22)
BUN: 22 mg/dL (ref 7–25)
CO2: 27 mmol/L (ref 20–32)
Calcium: 9.7 mg/dL (ref 8.6–10.4)
Chloride: 106 mmol/L (ref 98–110)
Creat: 1.09 mg/dL — ABNORMAL HIGH (ref 0.50–0.99)
Globulin: 2.9 g/dL (calc) (ref 1.9–3.7)
Glucose, Bld: 111 mg/dL (ref 65–139)
Potassium: 3.8 mmol/L (ref 3.5–5.3)
Sodium: 141 mmol/L (ref 135–146)
Total Bilirubin: 0.6 mg/dL (ref 0.2–1.2)
Total Protein: 7.2 g/dL (ref 6.1–8.1)

## 2018-05-17 LAB — IRON,TIBC AND FERRITIN PANEL
%SAT: 26 % (calc) (ref 16–45)
Ferritin: 29 ng/mL (ref 16–288)
Iron: 108 ug/dL (ref 45–160)
TIBC: 420 mcg/dL (calc) (ref 250–450)

## 2018-05-17 LAB — CBC WITH DIFFERENTIAL/PLATELET
Basophils Absolute: 19 cells/uL (ref 0–200)
Basophils Relative: 0.4 %
Eosinophils Absolute: 62 cells/uL (ref 15–500)
Eosinophils Relative: 1.3 %
HCT: 32.5 % — ABNORMAL LOW (ref 35.0–45.0)
Hemoglobin: 10.7 g/dL — ABNORMAL LOW (ref 11.7–15.5)
Lymphs Abs: 1022 cells/uL (ref 850–3900)
MCH: 29.6 pg (ref 27.0–33.0)
MCHC: 32.9 g/dL (ref 32.0–36.0)
MCV: 90 fL (ref 80.0–100.0)
MPV: 10.7 fL (ref 7.5–12.5)
Monocytes Relative: 7.3 %
Neutro Abs: 3346 cells/uL (ref 1500–7800)
Neutrophils Relative %: 69.7 %
Platelets: 157 10*3/uL (ref 140–400)
RBC: 3.61 10*6/uL — ABNORMAL LOW (ref 3.80–5.10)
RDW: 14 % (ref 11.0–15.0)
Total Lymphocyte: 21.3 %
WBC mixed population: 350 cells/uL (ref 200–950)
WBC: 4.8 10*3/uL (ref 3.8–10.8)

## 2018-06-01 ENCOUNTER — Other Ambulatory Visit: Payer: Self-pay

## 2018-06-01 DIAGNOSIS — D509 Iron deficiency anemia, unspecified: Secondary | ICD-10-CM

## 2018-06-02 ENCOUNTER — Ambulatory Visit (INDEPENDENT_AMBULATORY_CARE_PROVIDER_SITE_OTHER): Payer: BLUE CROSS/BLUE SHIELD | Admitting: Family Medicine

## 2018-06-02 ENCOUNTER — Encounter: Payer: Self-pay | Admitting: Family Medicine

## 2018-06-02 ENCOUNTER — Other Ambulatory Visit: Payer: Self-pay

## 2018-06-02 VITALS — BP 142/66 | HR 71 | Temp 98.9°F | Resp 14 | Ht 67.0 in | Wt 231.1 lb

## 2018-06-02 DIAGNOSIS — E876 Hypokalemia: Secondary | ICD-10-CM | POA: Diagnosis not present

## 2018-06-02 DIAGNOSIS — M62830 Muscle spasm of back: Secondary | ICD-10-CM | POA: Diagnosis not present

## 2018-06-02 DIAGNOSIS — F419 Anxiety disorder, unspecified: Secondary | ICD-10-CM

## 2018-06-02 DIAGNOSIS — Z23 Encounter for immunization: Secondary | ICD-10-CM

## 2018-06-02 DIAGNOSIS — I10 Essential (primary) hypertension: Secondary | ICD-10-CM

## 2018-06-02 MED ORDER — AMLODIPINE BESYLATE 5 MG PO TABS: 5.0000 mg | ORAL_TABLET | Freq: Every day | ORAL | 1 refills | Status: AC

## 2018-06-02 MED ORDER — METHOCARBAMOL 500 MG PO TABS: 500.0000 mg | ORAL_TABLET | Freq: Three times a day (TID) | ORAL | 0 refills | Status: DC | PRN

## 2018-06-02 MED ORDER — POTASSIUM CHLORIDE ER 10 MEQ PO CPCR: 10.0000 meq | ORAL_CAPSULE | Freq: Every day | ORAL | 1 refills | Status: DC

## 2018-06-02 MED ORDER — KETOROLAC TROMETHAMINE 60 MG/2ML IM SOLN
60.0000 mg | Freq: Once | INTRAMUSCULAR | Status: AC
  Administered 2018-06-02: 60 mg via INTRAMUSCULAR

## 2018-06-02 MED ORDER — FLUOXETINE HCL 40 MG PO CAPS: ORAL_CAPSULE | ORAL | 1 refills | Status: DC

## 2018-06-02 NOTE — Progress Notes (Signed)
Patient ID: Toni [name], female    DOB: 07-22-1956, 62 y.o.   MRN: [id number]  Chief Complaint  Patient presents with  . Back Pain  . Anxiety    follow up    Allergies Iodinated diagnostic agents  Subjective:   Toni [name] is a 62 y.o. female who presents to [facility] today.  HPI Mrs. Batrez presents today for follow-up.  She reports that she is also had muscle spasms in her back for the past week or so.  She reports that she has had this happen in the past and it bothers her tremendously.  She reports that she is not sure what she did but she thinks she lifted a close basket and since then has had some muscle spasms of her right lower back.  She reports the muscles are tender to touch.  She has tried Flexeril but it does not help.  She would like a refill of a different muscle relaxer.  She also reports that her previous doctor had given her hydrocodone 7.5 mg and this was beneficial in the past.  She reports her mood is good.  Is doing well on the fluoxetine.  Does need a refill.  Denies any suicidal or homicidal ideation.  Is enjoying spending time with family.  Appetite is good.  Is active and gets out of the house frequently.  Sleeping well.  Does not feel anxious or distressed.  Reports that she does not feel down, depressed, or hopeless.  Has an upcoming appointment with Dr. Luan Pulling for her pulmonary issues.  Reports that she needs to get a refill on her potassium pills.  She reports that the last pill that I gave her was very large and she would like it switched to a small potassium pill.  Reports that she has not taken her fluid pill in the past several days because she has been very busy and when she takes the medication it makes her go to the bathroom frequently.  She reports that due to the muscle spasms in her back and not taking her fluid pill that that is most likely why her blood pressure is elevated today.  She denies any chest pain, shortness of  breath, or palpitations.  She reports she does have a little bit of swelling in her legs because she has not taken her blood pressure/fluid pill.  Back Pain  This is a new problem. The current episode started 1 to 4 weeks ago. The problem occurs daily. The problem is unchanged. The pain is present in the lumbar spine. The quality of the pain is described as stabbing (feels like someone is jabbing back wiht a stick and muscles are squeezing in back). The pain is at a severity of 10/10. The pain is moderate. The symptoms are aggravated by bending, standing and twisting. Pertinent negatives include no abdominal pain, bladder incontinence, bowel incontinence, chest pain, dysuria, fever, headaches, leg pain, numbness, paresis, paresthesias, pelvic pain, perianal numbness, tingling, weakness or weight loss. Risk factors include obesity, menopause, lack of exercise and sedentary lifestyle. She has tried muscle relaxant for the symptoms. The treatment provided mild relief.    Past Medical History:  Diagnosis Date  . Congestive heart failure, unspecified    Diastolic heart failure  . Degeneration of lumbar or lumbosacral intervertebral disc   . Depression   . Diverticulosis   . Dyspnea     chronic multifactorial large component of deconditioning  . Esophageal reflux   .  Gout   . History of kidney stones   . Insomnia    Working third shift  . Mitral stenosis    Secondary to under-sized ring annuloplasty following mitral valve repair  . Mitral valve disorders(424.0)    Mild to moderate mitral regurgitation  . Mitral valve insufficiency and aortic valve insufficiency   . Morbid obesity ([place])   . Normocytic anemia   . Other chest pain   . Other dyspnea and respiratory abnormality   . Paroxysmal atrial fibrillation ([name])    Stable no recurrence  . Peripheral edema    chronic  . S/P Maze operation for atrial fibrillation 02/27/2015  . S/P mitral valve repair 02/27/2015   "radical repair" including  resection of P2 segment of posterior leaflet with 26 mm Edwards Physio II annuloplasty ring  . S/P tricuspid valve repair 02/27/2015   26 mm annuloplasty ring  . Unspecified essential hypertension     Past Surgical History:  Procedure Laterality Date  . APPENDECTOMY    . AXILLARY LYMPH NODE DISSECTION Left 01/04/2018   Procedure: LEFT AXILLARY LYMPH NODE DISSECTION;  Surgeon: [name], MD;  Location: [facility];  Service: General;  Laterality: Left;  . BREAST BIOPSY Left 2009  . CARDIAC VALVE SURGERY  02/27/2015   Mikel Cella. Mitral Valve Repair. Radical Repair with resection of P2 Prolapse, 26 mm Physio II Ring Annuloplasty, Tricuspid Valve Repair (26 mm Ring Annuloplasty, Cox Maze IV Procedure on 02/27/2015  . CHOLECYSTECTOMY    . COLONOSCOPY  2011   Dr. Laural Golden: 49mm polyp from hepatic flexure (tubular adenoma)  . COLONOSCOPY N/A 11/10/2016   Dr. Gala Romney: Diverticulosis. Next colonoscopy 7 years, January 2025  . ESOPHAGOGASTRODUODENOSCOPY  2005    Dr. Gala Romney : normal esophagus s/p dilation. Small hiatal hernia noted. Felt improved after dilation.   . ESOPHAGOGASTRODUODENOSCOPY N/A 11/10/2016   Dr. Gala Romney: Moderate-sized hiatal hernia, esophagus normal status post empiric dilation for history of dysphagia  . HEEL SPUR SURGERY Right   . MALONEY DILATION N/A 11/10/2016   Procedure: Venia Minks DILATION;  Surgeon: Daneil Dolin, MD;  Location: [facility];  Service: Endoscopy;  Laterality: N/A;  . MINIMALLY INVASIVE TRICUSPID VALVE REPAIR  02/27/2015   [place]  . MITRAL VALVE REPAIR  02/27/2015   [place]  . PARTIAL MASTECTOMY WITH NEEDLE LOCALIZATION Left 01/04/2018   Procedure: LEFT PARTIAL MASTECTOMY STATUS POST NEEDLE LOCALIZATION;  Surgeon: [name], MD;  Location: [facility];  Service: General;  Laterality: Left;  . TEE WITHOUT CARDIOVERSION N/A 10/18/2016   Procedure: TRANSESOPHAGEAL ECHOCARDIOGRAM (TEE);  Surgeon: Arnoldo Lenis, MD;  Location: [facility];  Service: Endoscopy;  Laterality: N/A;  . TOTAL ABDOMINAL HYSTERECTOMY    . TOTAL KNEE ARTHROPLASTY Right   . TUBAL LIGATION      Family History  Problem Relation Age of Onset  . Hypertension Mother   . Heart disease Mother   . Kidney disease Father   . Stroke Father   . Diabetes Sister   . Kidney disease Sister   . Heart disease Sister   . Hypertension Sister   . Heart disease Brother   . Heart attack Son   . Hypertension Son   . Cancer Paternal Grandmother        Breast Cancer  . Heart disease Brother   . Colon cancer Neg Hx      Social History   Socioeconomic History  . Marital status: Single    Spouse  name: Not on file  . Number of children: Not on file  . Years of education: Not on file  . Highest education level: Not on file  Occupational History  . Not on file  Social Needs  . Financial resource strain: Not on file  . Food insecurity:    Worry: Not on file    Inability: Not on file  . Transportation needs:    Medical: Not on file    Non-medical: Not on file  Tobacco Use  . Smoking status: Former Smoker    Packs/day: 0.50    Years: 25.00    Pack years: 12.50    Types: Cigarettes    Last attempt to quit: 1997    Years since quitting: 22.5  . Smokeless tobacco: Former Systems developer    Types: Chew    Quit date: 61  . Tobacco comment: dipped tobacco during pregancy in 1975  Substance and Sexual Activity  . Alcohol use: No  . Drug use: No  . Sexual activity: Yes    Partners: Male  Lifestyle  . Physical activity:    Days per week: Not on file    Minutes per session: Not on file  . Stress: Not on file  Relationships  . Social connections:    Talks on phone: Not on file    Gets together: Not on file    Attends religious service: Not on file    Active member of club or organization: Not on file    Attends meetings of clubs or organizations: Not on file    Relationship status: Not on file  Other Topics Concern  . Not  on file  Social History Narrative   Lives alone or son will stay with her at times. Cooks. Eats well.     Review of Systems  Constitutional: Negative for activity change, appetite change, fatigue, fever and weight loss.  HENT: Negative for trouble swallowing and voice change.   Eyes: Negative for visual disturbance.  Respiratory: Negative for cough, choking, chest tightness and shortness of breath.   Cardiovascular: Negative for chest pain, palpitations and leg swelling.  Gastrointestinal: Negative for abdominal pain, anal bleeding, blood in stool, bowel incontinence, constipation, nausea and vomiting.  Genitourinary: Negative for bladder incontinence, decreased urine volume, difficulty urinating, dysuria, frequency, hematuria, pelvic pain and urgency.  Musculoskeletal: Positive for back pain and myalgias. Negative for neck pain and neck stiffness.  Neurological: Negative for dizziness, tingling, syncope, facial asymmetry, speech difficulty, weakness, light-headedness, numbness, headaches and paresthesias.  Hematological: Negative for adenopathy.  Psychiatric/Behavioral: Negative for agitation, behavioral problems, dysphoric mood, self-injury, sleep disturbance and suicidal ideas. The patient is not nervous/anxious.      Objective:   BP (!) 142/66 (BP Location: Right Arm, Patient Position: Sitting, Cuff Size: Large)   Pulse 71   Temp 98.9 F (37.2 C) (Temporal)   Resp 14   Ht 5\' 7"  (1.702 m)   Wt 231 lb 1.9 oz (104.8 kg)   LMP  (LMP Unknown)   SpO2 96%   BMI 36.20 kg/m   Physical Exam  Constitutional: She is oriented to person, place, and time. She appears well-developed and well-nourished.  HENT:  Head: Normocephalic and atraumatic.  Mouth/Throat: Oropharynx is clear and moist.  Eyes: Pupils are equal, round, and reactive to light. EOM are normal.  Neck: Normal range of motion. Neck supple.  Cardiovascular: Normal rate and regular rhythm.  Pulmonary/Chest: Effort normal and  breath sounds normal.  Musculoskeletal:  Lumbar back: She exhibits tenderness, pain and spasm. She exhibits no bony tenderness, no swelling and no edema.       Back:  Right-sided low back muscle tenderness to palpation.  Strength 5 out of 5 in upper and lower extremities.  Sensation intact.  Gait within normal limits.  No CVA tenderness to palpation.  Neurological: She is alert and oriented to person, place, and time.  Skin: Skin is warm and dry. Capillary refill takes less than 2 seconds.  Trace edema in lower extremities bilaterally.  Psychiatric: She has a normal mood and affect. Her behavior is normal. Judgment and thought content normal.  Vitals reviewed.   Depression screen [facility] 2/9 06/02/2018 03/03/2018 10/25/2017 08/03/2017 03/27/2015  Decreased Interest 0 0 0 0 0  Down, Depressed, Hopeless 0 0 0 0 0  PHQ - 2 Score 0 0 0 0 0    Assessment and Plan  1. Anxiety Stable. Patient counseled in detail regarding the risks of medication. Told to call or return to clinic if develop any worrisome signs or symptoms. Patient voiced understanding.  Suicide risks evaluated and documented in note if present or in the area below. Patient has protective factors of family and community support.  Patient reports that family believes is behaving rationally. Patient displays problem solving skills.   Patient specifically denies suicide ideation. Patient has access/information to healthcare contacts if situation or mood changes where patient is a risk to self or others or mood becomes unstable.   During the encounter, the patient had good eye contact and firm handshake regarding safety contract and agreement to seek help if mood worsens and not to harm self.   Patient understands the treatment plan and is in agreement. Agrees to keep follow up and call prior or return to clinic if needed.   - FLUoxetine (PROZAC) 40 MG capsule; TAKE 1 CAPSULE BY MOUTH EVERY DAY  Dispense: 90 capsule; Refill: 1  2.  Essential hypertension Compliance with medication was recommended.  We did discuss that she needs to take her medications as directed to decrease her cardiovascular risk.  She voiced understanding. - amLODipine (NORVASC) 5 MG tablet; Take 1 tablet (5 mg total) by mouth daily.  Dispense: 90 tablet; Refill: 1  3. Hypokalemia Patient will follow-up for recheck of potassium as directed.  Potassium from 2 weeks ago was reviewed. - potassium chloride (MICRO-K) 10 MEQ CR capsule; Take 1 capsule (10 mEq total) by mouth daily.  Dispense: 90 capsule; Refill: 1  4. Muscle spasm of back I did discuss with patient that I would not recommend that she be on hydrocodone or narcotics for her back pain.  She was counseled concerning worrisome signs and symptoms of low back pain/muscle spasms.  If those develop she agrees to follow-up or contact medical help.  We did discuss sedation risks of Robaxin.  We also discussed that if she uses over-the-counter Aleve or the indomethacin that this does confer some cardiovascular risks.  She will not use the Indocin per my request for her back pain.  She understands that this medication had been given to her as an as needed for her gout.  She did request a Toradol shot today and it was given to her.  She understands the risk versus benefits.  Discussed risks of cardiovascular thrombotic events related to NSAIDS. Discussed increased risk of AMI and CVA. Discussed risk of serious GI adverse events including bleeding, ulcers, and perforation. Patient understands risks of this medication.   - methocarbamol (  ROBAXIN) 500 MG tablet; Take 1 tablet (500 mg total) by mouth every 8 (eight) hours as needed for muscle spasms.  Dispense: 30 tablet; Refill: 0 - ketorolac (TORADOL) injection 60 mg  5. Immunization due Pneumonia shot given today.  Influenza vaccination recommended in the fall. - Pneumococcal polysaccharide vaccine 23-valent greater than or equal to 2yo  subcutaneous/IM  Return in about 2 months (around 08/03/2018). Caren Macadam, MD 06/02/2018

## 2018-06-21 ENCOUNTER — Other Ambulatory Visit: Payer: Self-pay | Admitting: Family Medicine

## 2018-06-21 DIAGNOSIS — G8929 Other chronic pain: Secondary | ICD-10-CM

## 2018-06-21 DIAGNOSIS — M545 Low back pain: Principal | ICD-10-CM

## 2018-06-22 ENCOUNTER — Telehealth: Payer: Self-pay | Admitting: Internal Medicine

## 2018-06-22 NOTE — Telephone Encounter (Signed)
PATIENT IS IN THE WAITING AREA AND SAID SHE WAS TOLD SHE COULD STOP BY AND GET LINZESS SAMPLES

## 2018-06-22 NOTE — Telephone Encounter (Signed)
Sample of Linzess 72 mcg given to pt ok per [facility] last ov 05/2018.

## 2018-07-19 ENCOUNTER — Encounter: Payer: Self-pay | Admitting: Family Medicine

## 2018-07-19 ENCOUNTER — Ambulatory Visit (INDEPENDENT_AMBULATORY_CARE_PROVIDER_SITE_OTHER): Payer: BLUE CROSS/BLUE SHIELD | Admitting: Family Medicine

## 2018-07-19 ENCOUNTER — Other Ambulatory Visit: Payer: Self-pay

## 2018-07-19 VITALS — BP 124/70 | HR 65 | Temp 98.0°F | Resp 12 | Ht 67.0 in | Wt 229.0 lb

## 2018-07-19 DIAGNOSIS — F419 Anxiety disorder, unspecified: Secondary | ICD-10-CM | POA: Diagnosis not present

## 2018-07-19 DIAGNOSIS — I10 Essential (primary) hypertension: Secondary | ICD-10-CM | POA: Diagnosis not present

## 2018-07-19 DIAGNOSIS — I5032 Chronic diastolic (congestive) heart failure: Secondary | ICD-10-CM

## 2018-07-19 NOTE — Progress Notes (Signed)
Patient ID: Toni [name], female    DOB: 05-Aug-1956, 62 y.o.   MRN: [id number]  Chief Complaint  Patient presents with  . Anxiety    Allergies Iodinated diagnostic agents  Subjective:   Toni [name] is a 62 y.o. female who presents to [facility] today.  HPI Here for follow up. Has been doing well. Needs refill on her anxiety medications. Has been to see pulmonary recently. Breathing is about the same. No cough. Still feels like energy can be low at times but doing well. Does not feel down, depressed, or hopeless. Appetite is good.  No edema in LE. No CP. Reports that Dr. Luan Pulling ordered a CPAP for her but she could not get it b/c she could not afford it due to the fact that she lost her medicaid.   He is still on permanent and total disability related to her congestive heart failure.  She does need a paper filled out today and signed basically stating that she is still on disability and therefore she will get some help with her rent.  She reports she is still on disability for her congestive heart failure and breathing issues.  She also needs a letter stating that cannot do jury duty.  She does have to take her fluid pill each day.  If she takes a fluid pill she reports that she is unable to go extended periods of time without being able to go to the bathroom.  She reports the fluid pill makes her urinate on an hourly basis at a minimum.  She reports she would never be able to complete the jury duty if she took her fluid pill but she is scared to not take her fluid pill several days in a row due to her heart issues. She also reports this makes her very nervous and tends to worsen her anxiety.  She reports her mood is good.  She is still following up with cardiology.   Past Medical History:  Diagnosis Date  . Congestive heart failure, unspecified    Diastolic heart failure  . Degeneration of lumbar or lumbosacral intervertebral disc   . Depression   . Diverticulosis     . Dyspnea     chronic multifactorial large component of deconditioning  . Esophageal reflux   . Gout   . History of kidney stones   . Insomnia    Working third shift  . Mitral stenosis    Secondary to under-sized ring annuloplasty following mitral valve repair  . Mitral valve disorders(424.0)    Mild to moderate mitral regurgitation  . Mitral valve insufficiency and aortic valve insufficiency   . Morbid obesity ([place])   . Normocytic anemia   . Other chest pain   . Other dyspnea and respiratory abnormality   . Paroxysmal atrial fibrillation ([name])    Stable no recurrence  . Peripheral edema    chronic  . S/P Maze operation for atrial fibrillation 02/27/2015  . S/P mitral valve repair 02/27/2015   "radical repair" including resection of P2 segment of posterior leaflet with 26 mm Edwards Physio II annuloplasty ring  . S/P tricuspid valve repair 02/27/2015   26 mm annuloplasty ring  . Unspecified essential hypertension     Past Surgical History:  Procedure Laterality Date  . APPENDECTOMY    . AXILLARY LYMPH NODE DISSECTION Left 01/04/2018   Procedure: LEFT AXILLARY LYMPH NODE DISSECTION;  Surgeon: [name], MD;  Location: [facility];  Service: General;  Laterality: Left;  . BREAST BIOPSY Left 2009  . CARDIAC VALVE SURGERY  02/27/2015   Mikel Cella. Mitral Valve Repair. Radical Repair with resection of P2 Prolapse, 26 mm Physio II Ring Annuloplasty, Tricuspid Valve Repair (26 mm Ring Annuloplasty, Cox Maze IV Procedure on 02/27/2015  . CHOLECYSTECTOMY    . COLONOSCOPY  2011   Dr. Laural Golden: 1mm polyp from hepatic flexure (tubular adenoma)  . COLONOSCOPY N/A 11/10/2016   Dr. Gala Romney: Diverticulosis. Next colonoscopy 7 years, January 2025  . ESOPHAGOGASTRODUODENOSCOPY  2005    Dr. Gala Romney : normal esophagus s/p dilation. Small hiatal hernia noted. Felt improved after dilation.   . ESOPHAGOGASTRODUODENOSCOPY N/A 11/10/2016   Dr. Gala Romney: Moderate-sized hiatal hernia, esophagus normal status post  empiric dilation for history of dysphagia  . HEEL SPUR SURGERY Right   . MALONEY DILATION N/A 11/10/2016   Procedure: Venia Minks DILATION;  Surgeon: Daneil Dolin, MD;  Location: [facility];  Service: Endoscopy;  Laterality: N/A;  . MINIMALLY INVASIVE TRICUSPID VALVE REPAIR  02/27/2015   [place]  . MITRAL VALVE REPAIR  02/27/2015   [place]  . PARTIAL MASTECTOMY WITH NEEDLE LOCALIZATION Left 01/04/2018   Procedure: LEFT PARTIAL MASTECTOMY STATUS POST NEEDLE LOCALIZATION;  Surgeon: [name], MD;  Location: [facility];  Service: General;  Laterality: Left;  . TEE WITHOUT CARDIOVERSION N/A 10/18/2016   Procedure: TRANSESOPHAGEAL ECHOCARDIOGRAM (TEE);  Surgeon: Arnoldo Lenis, MD;  Location: [facility];  Service: Endoscopy;  Laterality: N/A;  . TOTAL ABDOMINAL HYSTERECTOMY    . TOTAL KNEE ARTHROPLASTY Right   . TUBAL LIGATION      Family History  Problem Relation Age of Onset  . Hypertension Mother   . Heart disease Mother   . Kidney disease Father   . Stroke Father   . Diabetes Sister   . Kidney disease Sister   . Heart disease Sister   . Hypertension Sister   . Heart disease Brother   . Heart attack Son   . Hypertension Son   . Cancer Paternal Grandmother        Breast Cancer  . Heart disease Brother   . Colon cancer Neg Hx      Social History   Socioeconomic History  . Marital status: Single    Spouse name: Not on file  . Number of children: Not on file  . Years of education: Not on file  . Highest education level: Not on file  Occupational History  . Not on file  Social Needs  . Financial resource strain: Not on file  . Food insecurity:    Worry: Not on file    Inability: Not on file  . Transportation needs:    Medical: Not on file    Non-medical: Not on file  Tobacco Use  . Smoking status: Former Smoker    Packs/day: 0.50    Years: 25.00    Pack years: 12.50    Types: Cigarettes    Last  attempt to quit: 1997    Years since quitting: 22.7  . Smokeless tobacco: Former Systems developer    Types: Chew    Quit date: 47  . Tobacco comment: dipped tobacco during pregancy in 1975  Substance and Sexual Activity  . Alcohol use: No  . Drug use: No  . Sexual activity: Yes    Partners: Male  Lifestyle  . Physical activity:    Days per week: Not on file    Minutes per session: Not  on file  . Stress: Not on file  Relationships  . Social connections:    Talks on phone: Not on file    Gets together: Not on file    Attends religious service: Not on file    Active member of club or organization: Not on file    Attends meetings of clubs or organizations: Not on file    Relationship status: Not on file  Other Topics Concern  . Not on file  Social History Narrative   Lives alone or son will stay with her at times. Cooks. Eats well.    Current Outpatient Medications on File Prior to Visit  Medication Sig Dispense Refill  . acetaminophen (TYLENOL) 500 MG tablet Take 1,000 mg by mouth daily as needed for moderate pain or headache.    . albuterol (PROVENTIL HFA;VENTOLIN HFA) 108 (90 Base) MCG/ACT inhaler Inhale 2 puffs into the lungs every 6 (six) hours as needed for wheezing or shortness of breath. 1 Inhaler 3  . amLODipine (NORVASC) 5 MG tablet Take 1 tablet (5 mg total) by mouth daily. 90 tablet 1  . aspirin EC 81 MG tablet Take 1 tablet (81 mg total) by mouth daily. 90 tablet 3  . benzonatate (TESSALON) 100 MG capsule Take 1-2 capsules by mouth 3 (three) times daily as needed. cough  2  . FLUoxetine (PROZAC) 40 MG capsule TAKE 1 CAPSULE BY MOUTH EVERY DAY 90 capsule 1  . gabapentin (NEURONTIN) 300 MG capsule TAKE 1 CAPSULE BY MOUTH IN THE MORNING AND 2 CAPSULES IN THE EVENING 270 capsule 0  . guaiFENesin (MUCINEX) 600 MG 12 hr tablet Take 600 mg by mouth 2 (two) times daily as needed (congestion).    . indomethacin (INDOCIN) 25 MG capsule Take 1 capsule by mouth 3 (three) times daily as  needed. gout  1  . losartan (COZAAR) 100 MG tablet Take 1 tablet (100 mg total) by mouth daily. 30 tablet 11  . magnesium gluconate (MAGONATE) 500 MG tablet Take 500 mg by mouth daily.    . methocarbamol (ROBAXIN) 500 MG tablet Take 1 tablet (500 mg total) by mouth every 8 (eight) hours as needed for muscle spasms. 30 tablet 0  . metoprolol tartrate (LOPRESSOR) 100 MG tablet TAKE 1 TABLET BY MOUTH TWICE A DAY 180 tablet 0  . omeprazole (PRILOSEC) 40 MG capsule TAKE 1 CAPSULE BY MOUTH EVERY DAY 90 capsule 1  . potassium chloride (MICRO-K) 10 MEQ CR capsule Take 1 capsule (10 mEq total) by mouth daily. 90 capsule 1  . torsemide (DEMADEX) 20 MG tablet Take 1 tablet (20 mg total) by mouth daily. (Patient taking differently: Take 20-40 mg by mouth daily. ) 90 tablet 3  . trolamine salicylate (ASPERCREME) 10 % cream Apply 1 application topically as needed for muscle pain.     No current facility-administered medications on file prior to visit.      Review of Systems  Constitutional: Negative for activity change, appetite change and fever.  HENT: Negative for trouble swallowing and voice change.   Eyes: Negative for visual disturbance.  Respiratory: Negative for cough, chest tightness and shortness of breath.   Cardiovascular: Negative for chest pain, palpitations and leg swelling.  Gastrointestinal: Negative for abdominal pain, nausea and vomiting.  Genitourinary: Negative for dysuria, frequency and urgency.  Skin: Negative for rash.  Neurological: Negative for dizziness, syncope and light-headedness.  Hematological: Negative for adenopathy.  Psychiatric/Behavioral: Negative for agitation, behavioral problems, dysphoric mood, hallucinations and suicidal ideas. The patient is  not nervous/anxious and is not hyperactive.      Objective:   BP 124/70 (BP Location: Right Arm, Patient Position: Sitting, Cuff Size: Large)   Pulse 65   Temp 98 F (36.7 C) (Temporal)   Resp 12   Ht 5\' 7"  (1.702  m)   Wt 229 lb 0.6 oz (103.9 kg)   LMP  (LMP Unknown)   SpO2 92% Comment: room air  BMI 35.87 kg/m   Physical Exam  Constitutional: She is oriented to person, place, and time. She appears well-developed and well-nourished. No distress.  HENT:  Head: Normocephalic and atraumatic.  Mouth/Throat: Oropharynx is clear and moist.  Eyes: Pupils are equal, round, and reactive to light. Conjunctivae are normal.  Neck: Normal range of motion. Neck supple. No JVD present. No thyromegaly present.  Cardiovascular: Normal rate, regular rhythm and normal heart sounds.  Pulmonary/Chest: Effort normal and breath sounds normal. No respiratory distress.  Abdominal: Soft. Bowel sounds are normal.  Neurological: She is alert and oriented to person, place, and time. No cranial nerve deficit.  Skin: Skin is warm and dry.  Psychiatric: She has a normal mood and affect. Her behavior is normal. Judgment and thought content normal.  Nursing note and vitals reviewed.   Depression screen [facility] 2/9 07/19/2018 06/02/2018 03/03/2018 10/25/2017 08/03/2017  Decreased Interest 1 0 0 0 0  Down, Depressed, Hopeless 0 0 0 0 0  PHQ - 2 Score 1 0 0 0 0    Assessment and Plan  1. Anxiety/depression Improved.  Continue current medications.  Relaxation and stress management discussed. Suicide risks evaluated and documented in note if present or in the area below. Patient has protective factors of family and community support.  Patient reports that family believes is behaving rationally. Patient displays problem solving skills.   Patient specifically denies suicide ideation. Patient has access/information to healthcare contacts if situation or mood changes where patient is a risk to self or others or mood becomes unstable.   During the encounter, the patient had good eye contact and firm handshake regarding safety contract and agreement to seek help if mood worsens and not to harm self.   Patient understands the treatment  plan and is in agreement. Agrees to keep follow up and call prior or return to clinic if needed.   2. Essential hypertension Stable.  Continue medications as directed.  Patient defers blood work today.  3. Chronic diastolic heart failure ([place]) Patient with total and permanent disability secondary to her chronic diastolic heart failure breathing issues.  She is followed by pulmonary and cardiology.  She was given a letter today which was written and typed at her office visit.  This letter is a written request to excuse her from jury duty.  In addition, her paperwork was signed that she does have permanent disability as deemed by the state.  Office visit was approximately 15 minutes.  Greater than 50% of office visit was spent counseling and coordinating care.  Patient was asked to schedule visit with new PCP within the next month.  She was encouraged to keep her scheduled visits with pulmonary, cardiology, and other specialist. No follow-ups on file. Tod Persia, [place] 07/19/2018

## 2018-07-25 ENCOUNTER — Other Ambulatory Visit: Payer: Self-pay | Admitting: Family Medicine

## 2018-07-25 DIAGNOSIS — I10 Essential (primary) hypertension: Secondary | ICD-10-CM

## 2018-07-26 ENCOUNTER — Other Ambulatory Visit: Payer: Self-pay

## 2018-07-26 DIAGNOSIS — D509 Iron deficiency anemia, unspecified: Secondary | ICD-10-CM

## 2018-08-01 ENCOUNTER — Ambulatory Visit: Payer: BLUE CROSS/BLUE SHIELD | Admitting: Family Medicine

## 2018-09-01 LAB — CBC WITH DIFFERENTIAL/PLATELET
Basophils Absolute: 28 cells/uL (ref 0–200)
Basophils Relative: 0.6 %
Eosinophils Absolute: 80 cells/uL (ref 15–500)
Eosinophils Relative: 1.7 %
HCT: 31.4 % — ABNORMAL LOW (ref 35.0–45.0)
Hemoglobin: 10.3 g/dL — ABNORMAL LOW (ref 11.7–15.5)
Lymphs Abs: 1053 cells/uL (ref 850–3900)
MCH: 29.7 pg (ref 27.0–33.0)
MCHC: 32.8 g/dL (ref 32.0–36.0)
MCV: 90.5 fL (ref 80.0–100.0)
MPV: 10.6 fL (ref 7.5–12.5)
Monocytes Relative: 9.2 %
Neutro Abs: 3107 cells/uL (ref 1500–7800)
Neutrophils Relative %: 66.1 %
Platelets: 155 10*3/uL (ref 140–400)
RBC: 3.47 10*6/uL — ABNORMAL LOW (ref 3.80–5.10)
RDW: 14 % (ref 11.0–15.0)
Total Lymphocyte: 22.4 %
WBC mixed population: 432 cells/uL (ref 200–950)
WBC: 4.7 10*3/uL (ref 3.8–10.8)

## 2018-09-06 ENCOUNTER — Encounter: Payer: Self-pay | Admitting: Gastroenterology

## 2018-09-18 ENCOUNTER — Other Ambulatory Visit: Payer: Self-pay

## 2018-09-18 ENCOUNTER — Encounter: Payer: Self-pay | Admitting: Gastroenterology

## 2018-09-18 DIAGNOSIS — D649 Anemia, unspecified: Secondary | ICD-10-CM

## 2018-09-18 NOTE — Progress Notes (Signed)
PATIENT SCHEDULED AND LETTER SENT  °

## 2018-09-26 ENCOUNTER — Other Ambulatory Visit (HOSPITAL_COMMUNITY)
Admission: RE | Admit: 2018-09-26 | Discharge: 2018-09-26 | Disposition: A | Discharge status: Home / Self Care | Payer: BLUE CROSS/BLUE SHIELD | Source: Ambulatory Visit | Attending: Pulmonary Disease | Admitting: Pulmonary Disease

## 2018-09-26 ENCOUNTER — Other Ambulatory Visit (HOSPITAL_COMMUNITY): Payer: Self-pay | Admitting: Pulmonary Disease

## 2018-09-26 ENCOUNTER — Ambulatory Visit (HOSPITAL_COMMUNITY)
Admission: RE | Admit: 2018-09-26 | Discharge: 2018-09-26 | Disposition: A | Discharge status: Home / Self Care | Payer: BLUE CROSS/BLUE SHIELD | Source: Ambulatory Visit | Attending: Pulmonary Disease | Admitting: Pulmonary Disease

## 2018-09-26 DIAGNOSIS — I5032 Chronic diastolic (congestive) heart failure: Secondary | ICD-10-CM | POA: Insufficient documentation

## 2018-09-26 DIAGNOSIS — I10 Essential (primary) hypertension: Secondary | ICD-10-CM

## 2018-09-26 DIAGNOSIS — R0600 Dyspnea, unspecified: Secondary | ICD-10-CM | POA: Insufficient documentation

## 2018-09-26 DIAGNOSIS — R0602 Shortness of breath: Secondary | ICD-10-CM | POA: Diagnosis not present

## 2018-09-26 DIAGNOSIS — R0989 Other specified symptoms and signs involving the circulatory and respiratory systems: Secondary | ICD-10-CM | POA: Insufficient documentation

## 2018-09-26 DIAGNOSIS — G4733 Obstructive sleep apnea (adult) (pediatric): Secondary | ICD-10-CM | POA: Insufficient documentation

## 2018-09-26 LAB — BRAIN NATRIURETIC PEPTIDE: B Natriuretic Peptide: 2514 pg/mL — ABNORMAL HIGH (ref 0.0–100.0)

## 2018-09-28 ENCOUNTER — Telehealth: Payer: Self-pay | Admitting: Cardiology

## 2018-09-28 NOTE — Telephone Encounter (Signed)
Sounds like Dr Luan Pulling is aggressively treating her symptoms, if she could update [place] on her symptoms late next week to see how she is responding to the therapy.   Zandra Abts MD

## 2018-09-28 NOTE — Telephone Encounter (Signed)
Pt says since Saturday has been SOB and some chest tightness - Dr Luan Pulling gave her azithromycin and prednisone for her wheezing and upper respiratory congestion - also increased torsemide 40 mg daily for 1 week (feet and ankle swelling) pt denies any weight gain - pt has appt with Dr Harl Bowie 12/16 - advised pt to continue to monitor symptoms and would forward to provider for any further suggestions

## 2018-09-28 NOTE — Telephone Encounter (Signed)
Please call Yolanda @ Dr. Luan Pulling office [phone]   per Dr. Luan Pulling- having SOB and Chest tightness for 3-4 days, chest xray showing pulmonary vascular congestion wanting pt to be seen sooner.

## 2018-09-28 NOTE — Telephone Encounter (Signed)
Pt agreeable and will update [place] next week

## 2018-10-12 ENCOUNTER — Encounter: Payer: Self-pay | Admitting: Internal Medicine

## 2018-10-23 ENCOUNTER — Encounter: Payer: Self-pay | Admitting: Cardiology

## 2018-10-23 ENCOUNTER — Encounter

## 2018-10-23 ENCOUNTER — Ambulatory Visit (INDEPENDENT_AMBULATORY_CARE_PROVIDER_SITE_OTHER): Payer: BLUE CROSS/BLUE SHIELD | Admitting: Cardiology

## 2018-10-23 VITALS — BP 126/78 | HR 58 | Ht 67.0 in | Wt 231.0 lb

## 2018-10-23 DIAGNOSIS — I050 Rheumatic mitral stenosis: Secondary | ICD-10-CM

## 2018-10-23 DIAGNOSIS — Z9889 Other specified postprocedural states: Secondary | ICD-10-CM

## 2018-10-23 DIAGNOSIS — R600 Localized edema: Secondary | ICD-10-CM

## 2018-10-23 DIAGNOSIS — R0602 Shortness of breath: Secondary | ICD-10-CM

## 2018-10-23 MED ORDER — TORSEMIDE 20 MG PO TABS: 40.0000 mg | ORAL_TABLET | Freq: Every day | ORAL | 3 refills | Status: DC

## 2018-10-23 NOTE — Patient Instructions (Addendum)
Medication Instructions:   INCREASE Torsemide to 40 mg daily  If you need a refill on your cardiac medications before your next appointment, please call your pharmacy.   Lab work: In 1 week: magnesium, bmet, Tsh If you have labs (blood work) drawn today and your tests are completely normal, you will receive your results only by: Marland Kitchen MyChart Message (if you have MyChart) OR . A paper copy in the mail If you have any lab test that is abnormal or we need to change your treatment, we will call you to review the results.  Testing/Procedures: Your physician has requested that you have an echocardiogram. Echocardiography is a painless test that uses sound waves to create images of your heart. It provides your doctor with information about the size and shape of your heart and how well your heart's chambers and valves are working. This procedure takes approximately one hour. There are no restrictions for this procedure.    Follow-Up: At [facility], you and your health needs are our priority.  As part of our continuing mission to provide you with exceptional heart care, we have created designated Provider Care Teams.  These Care Teams include your primary Cardiologist (physician) and Advanced Practice Providers (APPs -  Physician Assistants and Nurse Practitioners) who all work together to provide you with the care you need, when you need it. You will need a follow up appointment on 11/13/2018 at 2 pm .  Please call our office 2 months in advance to schedule this appointment.  You may see Carlyle Dolly, MD or one of the following Advanced Practice Providers on your designated Care Team:   Bernerd Pho, PA-C [facility]) . Ermalinda Barrios, PA-C ([place])  Any Other Special Instructions Will Be Listed Below (If Applicable). NONE

## 2018-10-23 NOTE — Progress Notes (Signed)
Clinical Summary Toni [name] is a 61 y.o.female seen today for for follow up of the following medical problems. This is a focused visit on recent symptoms of SOB.   1. Mitral regurgitation - history of previous MV repair at [facility] 02/2015 - Radical Repair with resection of P2 Prolapse, 26 mm Physio II Ring Annuloplasty, Tricuspid Valve Repair (26 mm Ring Annuloplasty, Cox Maze IV Procedure on 02/27/2015 - cath prior to surgery showed no significant CAD - post repair echo 02/2015 showed no MR, mild TR. There are no reported gradients across the valves in the report - has had some recent SOB. Increased DOE over the last few months, at 1/2 block. Subjective abdominal distension. +orthopnea x 3 pillows which is chronic. Reports some weight gain.   02/2015 PFTs normal(on care everywhere) -10/2017echo showed elevated gradients across her repaired MV and TV consistent with post repair stenosis.  - TEE 10/2016 showed restricted motion of posterior MV leaflet with mitral stenosis, mean resting gradient of 8 mmHg.  - 10/2017 echo LVEF 45-50%, mean grad MV 11 mmHg.PASP 58. Moderate RV dysfunction   -some recent increase in SOB.    2.Pulm edema - noted on 09/2018 CXR. BNP up to 2514.  - she increased torsemide to 40mg  x 5 days about 1 month ago - not much improvement in symptoms. Had some weight gain, baseline 225 up to 232 lbs.  - DOE with exertion, can have some chest heaviness. Some cough, some wheezing. Did have a course of prednisone and abx.      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 Congestive heart failure, unspecified    Diastolic heart failure  . Degeneration of lumbar or lumbosacral intervertebral disc   . Depression   . Diverticulosis   . Dyspnea     chronic multifactorial large component of deconditioning  . Esophageal reflux   . Gout   . History of kidney stones   . Insomnia    Working third shift  . Mitral stenosis    Secondary to under-sized ring annuloplasty following mitral valve repair  . Mitral valve disorders(424.0)    Mild to moderate mitral regurgitation  . Mitral valve insufficiency and aortic valve insufficiency   . Morbid obesity ([place])   . Normocytic anemia   . Other chest pain   . Other dyspnea and respiratory abnormality   . Paroxysmal atrial fibrillation ([name])    Stable no recurrence  . Peripheral edema    chronic  . S/P Maze operation for atrial fibrillation 02/27/2015  . S/P mitral valve repair 02/27/2015   "radical repair" including resection of P2 segment of posterior leaflet with 26 mm Edwards Physio II annuloplasty ring  . S/P tricuspid valve repair 02/27/2015   26 mm annuloplasty ring  . Unspecified essential hypertension      Allergies  Allergen Reactions  . Iodinated Diagnostic Agents Swelling    EYES     Current Outpatient Medications  Medication Sig Dispense Refill  . acetaminophen (TYLENOL) 500 MG tablet Take 1,000 mg by mouth daily as needed for moderate pain or headache.    . albuterol (PROVENTIL HFA;VENTOLIN HFA) 108 (90 Base) MCG/ACT inhaler Inhale 2 puffs into the lungs every 6 (six) hours as needed for wheezing or shortness of breath. 1 Inhaler 3  . amLODipine (NORVASC) 5 MG tablet Take 1 tablet (5 mg total) by mouth daily. 90 tablet 1  .  aspirin EC 81 MG tablet Take 1 tablet (81 mg total) by mouth daily. 90 tablet 3  . benzonatate (TESSALON) 100 MG capsule Take 1-2 capsules by mouth 3 (three) times daily as needed. cough  2  . FLUoxetine (PROZAC) 40 MG capsule TAKE 1 CAPSULE BY MOUTH EVERY DAY 90 capsule 1  . gabapentin (NEURONTIN) 300 MG capsule TAKE 1 CAPSULE BY MOUTH IN THE MORNING AND 2 CAPSULES IN THE EVENING 270 capsule 0  . guaiFENesin (MUCINEX) 600 MG 12 hr tablet Take 600 mg by mouth 2 (two) times daily as needed (congestion).    . indomethacin (INDOCIN) 25 MG capsule Take 1 capsule by mouth 3 (three) times  daily as needed. gout  1  . losartan (COZAAR) 100 MG tablet Take 1 tablet (100 mg total) by mouth daily. 30 tablet 11  . magnesium gluconate (MAGONATE) 500 MG tablet Take 500 mg by mouth daily.    . methocarbamol (ROBAXIN) 500 MG tablet Take 1 tablet (500 mg total) by mouth every 8 (eight) hours as needed for muscle spasms. 30 tablet 0  . metoprolol tartrate (LOPRESSOR) 100 MG tablet TAKE 1 TABLET BY MOUTH TWICE A DAY 180 tablet 2  . omeprazole (PRILOSEC) 40 MG capsule TAKE 1 CAPSULE BY MOUTH EVERY DAY 90 capsule 1  . potassium chloride (MICRO-K) 10 MEQ CR capsule Take 1 capsule (10 mEq total) by mouth daily. 90 capsule 1  . torsemide (DEMADEX) 20 MG tablet Take 1 tablet (20 mg total) by mouth daily. (Patient taking differently: Take 20-40 mg by mouth daily. ) 90 tablet 3  . trolamine salicylate (ASPERCREME) 10 % cream Apply 1 application topically as needed for muscle pain.     No current facility-administered medications for this visit.      Past Surgical History:  Procedure Laterality Date  . APPENDECTOMY    . AXILLARY LYMPH NODE DISSECTION Left 01/04/2018   Procedure: LEFT AXILLARY LYMPH NODE DISSECTION;  Surgeon: [name], MD;  Location: [facility];  Service: General;  Laterality: Left;  . BREAST BIOPSY Left 2009  . CARDIAC VALVE SURGERY  02/27/2015   [facility]. Mitral Valve Repair. Radical Repair with resection of P2 Prolapse, 26 mm Physio II Ring Annuloplasty, Tricuspid Valve Repair (26 mm Ring Annuloplasty, Cox Maze IV Procedure on 02/27/2015  . CHOLECYSTECTOMY    . COLONOSCOPY  2011   Dr. Laural Golden: 65mm polyp from hepatic flexure (tubular adenoma)  . COLONOSCOPY N/A 11/10/2016   Dr. Gala Romney: Diverticulosis. Next colonoscopy 7 years, January 2025  . ESOPHAGOGASTRODUODENOSCOPY  2005    Dr. Gala Romney : normal esophagus s/p dilation. Small hiatal hernia noted. Felt improved after dilation.   . ESOPHAGOGASTRODUODENOSCOPY N/A 11/10/2016   Dr. Gala Romney: Moderate-sized hiatal hernia, esophagus  normal status post empiric dilation for history of dysphagia  . HEEL SPUR SURGERY Right   . MALONEY DILATION N/A 11/10/2016   Procedure: Venia Minks DILATION;  Surgeon: Daneil Dolin, MD;  Location: [facility] SU[facility];  Service: Endoscopy;  Laterality: N/A;  . MINIMALLY INVASIVE TRICUSPID VALVE REPAIR  02/27/2015   [place]  . MITRAL VALVE REPAIR  02/27/2015   [place]  . PARTIAL MASTECTOMY WITH NEEDLE LOCALIZATION Left 01/04/2018   Procedure: LEFT PARTIAL MASTECTOMY STATUS POST NEEDLE LOCALIZATION;  Surgeon: [name], MD;  Location: [facility];  Service: General;  Laterality: Left;  . TEE WITHOUT CARDIOVERSION N/A 10/18/2016   Procedure: TRANSESOPHAGEAL ECHOCARDIOGRAM (TEE);  Surgeon: Arnoldo Lenis, MD;  Location: [facility];  Service: Endoscopy;  Laterality: N/A;  . TOTAL ABDOMINAL HYSTERECTOMY    . TOTAL KNEE ARTHROPLASTY Right   . TUBAL LIGATION       Allergies  Allergen Reactions  . Iodinated Diagnostic Agents Swelling    EYES      Family History  Problem Relation Age of Onset  . Hypertension Mother   . Heart disease Mother   . Kidney disease Father   . Stroke Father   . Diabetes Sister   . Kidney disease Sister   . Heart disease Sister   . Hypertension Sister   . Heart disease Brother   . Heart attack Son   . Hypertension Son   . Cancer Paternal Grandmother        Breast Cancer  . Heart disease Brother   . Colon cancer Neg Hx      Social History Toni [name] reports that she quit smoking about 22 years ago. Her smoking use included cigarettes. She has a 12.50 pack-year smoking history. She quit smokeless tobacco use about 44 years ago.  Her smokeless tobacco use included chew. Toni [name] reports no history of alcohol use.   Review of Systems CONSTITUTIONAL: No weight loss, fever, chills, weakness or fatigue.  HEENT: Eyes: No visual loss, blurred vision, double vision or yellow sclerae.No hearing loss,  sneezing, congestion, runny nose or sore throat.  SKIN: No rash or itching.  CARDIOVASCULAR: per hpi RESPIRATORY: per hpi GASTROINTESTINAL: No anorexia, nausea, vomiting or diarrhea. No abdominal pain or blood.  GENITOURINARY: No burning on urination, no polyuria NEUROLOGICAL: No headache, dizziness, syncope, paralysis, ataxia, numbness or tingling in the extremities. No change in bowel or bladder control.  MUSCULOSKELETAL: No muscle, back pain, joint pain or stiffness.  LYMPHATICS: No enlarged nodes. No history of splenectomy.  PSYCHIATRIC: No history of depression or anxiety.  ENDOCRINOLOGIC: No reports of sweating, cold or heat intolerance. No polyuria or polydipsia.  Marland Kitchen   Physical Examination Vitals:   10/23/18 1430  BP: 126/78  Pulse: (!) 58  SpO2: 94%   Vitals:   10/23/18 1430  Weight: 231 lb (104.8 kg)  Height: 5\' 7"  (1.702 m)    Gen: resting comfortably, no acute distress HEENT: no scleral icterus, pupils equal round and reactive, no palptable cervical adenopathy,  CV: RRR, no m/r/g, no jvd Resp: Clear to auscultation bilaterally GI: abdomen is soft, non-tender, non-distended, normal bowel sounds, no hepatosplenomegaly MSK: extremities are warm, no edema.  Skin: warm, no rash Neuro:  no focal deficits Psych: appropriate affect   Diagnostic Studies 08/2016 echo Study Conclusions  - Left ventricle: The cavity size was normal. Wall thickness was increased in a pattern of mild LVH. Systolic function was normal. The estimated ejection fraction was in the range of 50% to 55%. Wall motion was normal; there were no regional wall motion abnormalities. The study was not technically sufficient to allow evaluation of LV diastolic dysfunction due to atrial fibrillation. - Aortic valve: Mildly calcified annulus. Trileaflet; mildly thickened leaflets. There was mild regurgitation. Valve area (VTI): 2.47 cm^2. Valve area (Vmax): 2.47 cm^2. Valve  area (Vmean): 2.43 cm^2. - Mitral valve: The is a MV anular ring present s/p mitral valve repair. There is an increased gradient across the MV. Mean gradient 12 mmHg. - Left atrium: The atrium was severely dilated. - Right ventricle: The cavity size was mildly dilated. Systolic function was mildly to moderately reduced. - Right atrium: The atrium was mildly dilated. - Tricuspid valve: A TV anular ring is present.  Mean gradient across the TV of 7 mmHg. - Pulmonary arteries: Systolic pressure was mildly increased. PA peak pressure: 31 mm Hg (S). - Technically adequate study.   10/2016 TEE Study Conclusions  - Left ventricle: The cavity size was normal. Wall thickness was normal. Systolic function was normal. The estimated ejection fraction was in the range of 55% to 60%. - Mitral valve: The posterior leaflet is moderately thickened with severely restricted motion. There is a moderate to severe gradient across the MV of 8 mmHg. There was mild regurgitation. Mean gradient (D): 8 mm Hg. - Left atrium: The atrium was severely dilated. - Right atrium: No evidence of thrombus in the atrial cavity or appendage.   10/2017 echo Study Conclusions  - Left ventricle: The cavity size was normal. Wall thickness was increased in a pattern of mild LVH. Systolic function was mildly reduced. The estimated ejection fraction was in the range of 45% to 50%. Diffuse hypokinesis. The study is not technically sufficient to allow evaluation of LV diastolic function. - Aortic valve: Mildly calcified annulus. Trileaflet. Mean gradient (S): 3 mm Hg. Valve area (VTI): 2.21 cm^2. - Mitral valve: The findings are consistent with severe stenosis. There was trivial regurgitation. Mean gradient (D): 11 mm Hg. Planimetered valve area: 0.9 cm^2. Valve area by continuity equation (using LVOT flow): 0.68 cm^2. - Left atrium: The atrium was severely dilated. - Right  ventricle: The cavity size was mildly dilated. Systolic function was moderately reduced. - Right atrium: The atrium was mildly dilated. Central venous pressure (est): 15 mm Hg. - Atrial septum: No defect or patent foramen ovale was identified. - Tricuspid valve: There was mild regurgitation. - Pulmonary arteries: PA peak pressure: 58 mm Hg (S). - Pericardium, extracardiac: There was no pericardial effusion.  Impressions:  - Mild LVH with LVEF approximately 45-50%. Indeterminate diastolic function. Severe left atrial enlargement. Findings consistent with severe mitral stenosis as detailed above. Moderately reduced right ventricular contraction. Mild tricuspid regurgitation with elevated PASP estimated 58 mmHg.     Assessment and Plan  1. Valvular heart disease/SOB - with recent symptoms and pulmonary edema on CXR will repeat echo - increase torsemide to 40mg  daily. [place]cek BMET/Mg/TSH in 1 week       Arnoldo Lenis, M.D.

## 2018-10-30 ENCOUNTER — Other Ambulatory Visit (HOSPITAL_COMMUNITY)
Admission: RE | Admit: 2018-10-30 | Discharge: 2018-10-30 | Disposition: A | Discharge status: Home / Self Care | Payer: BLUE CROSS/BLUE SHIELD | Source: Ambulatory Visit | Attending: Cardiology | Admitting: Cardiology

## 2018-10-30 DIAGNOSIS — R0602 Shortness of breath: Secondary | ICD-10-CM | POA: Diagnosis present

## 2018-10-30 LAB — BASIC METABOLIC PANEL
Anion gap: 5 (ref 5–15)
BUN: 24 mg/dL — ABNORMAL HIGH (ref 8–23)
CO2: 23 mmol/L (ref 22–32)
Calcium: 9.1 mg/dL (ref 8.9–10.3)
Chloride: 111 mmol/L (ref 98–111)
Creatinine, Ser: 1.05 mg/dL — ABNORMAL HIGH (ref 0.44–1.00)
GFR calc Af Amer: >60 mL/min (ref >60)
GFR calc non Af Amer: 56 mL/min — ABNORMAL LOW (ref >60)
Glucose, Bld: 85 mg/dL (ref 70–99)
Potassium: 3.7 mmol/L (ref 3.5–5.1)
Sodium: 139 mmol/L (ref 135–145)

## 2018-10-30 LAB — TSH: TSH: 0.507 u[IU]/mL (ref 0.350–4.500)

## 2018-10-30 LAB — MAGNESIUM: Magnesium: 2.2 mg/dL (ref 1.7–2.4)

## 2018-11-07 ENCOUNTER — Ambulatory Visit (HOSPITAL_COMMUNITY)
Admission: RE | Admit: 2018-11-07 | Discharge: 2018-11-07 | Disposition: A | Discharge status: Home / Self Care | Payer: BLUE CROSS/BLUE SHIELD | Source: Ambulatory Visit | Attending: Cardiology | Admitting: Cardiology

## 2018-11-07 DIAGNOSIS — R0602 Shortness of breath: Secondary | ICD-10-CM | POA: Diagnosis not present

## 2018-11-07 NOTE — Progress Notes (Signed)
*  PRELIMINARY RESULTS* Echocardiogram 2D Echocardiogram has been performed.  Samuel Germany 11/07/2018, 11:05 AM

## 2018-11-13 ENCOUNTER — Ambulatory Visit: Payer: PRIVATE HEALTH INSURANCE | Admitting: Cardiology

## 2018-11-13 ENCOUNTER — Encounter: Payer: Self-pay | Admitting: Cardiology

## 2018-11-13 VITALS — BP 122/74 | HR 58 | Ht 67.0 in | Wt 230.0 lb

## 2018-11-13 DIAGNOSIS — I10 Essential (primary) hypertension: Secondary | ICD-10-CM | POA: Diagnosis not present

## 2018-11-13 DIAGNOSIS — I5023 Acute on chronic systolic (congestive) heart failure: Secondary | ICD-10-CM

## 2018-11-13 DIAGNOSIS — Z79899 Other long term (current) drug therapy: Secondary | ICD-10-CM | POA: Diagnosis not present

## 2018-11-13 DIAGNOSIS — Z9889 Other specified postprocedural states: Secondary | ICD-10-CM

## 2018-11-13 MED ORDER — TORSEMIDE 20 MG PO TABS: 60.0000 mg | ORAL_TABLET | Freq: Every day | ORAL | 3 refills | Status: DC

## 2018-11-13 MED ORDER — METOPROLOL SUCCINATE ER 100 MG PO TB24: 100.0000 mg | ORAL_TABLET | Freq: Two times a day (BID) | ORAL | 3 refills | Status: DC

## 2018-11-13 NOTE — Patient Instructions (Signed)
Medication Instructions:  Your physician has recommended you make the following change in your medication:  Stop Taking Metoprolol ( Lopressor)  Start Toprol XL 100 mg Two Times Daily  Increase Torsemide to 60 mg Daily   If you need a refill on your cardiac medications before your next appointment, please call your pharmacy.   Lab work: Your physician recommends that you return for lab work in: 1 Week   If you have labs (blood work) drawn today and your tests are completely normal, you will receive your results only by: Marland Kitchen MyChart Message (if you have MyChart) OR . A paper copy in the mail If you have any lab test that is abnormal or we need to change your treatment, we will call you to review the results.  Testing/Procedures: NONE   Follow-Up: At [facility], you and your health needs are our priority.  As part of our continuing mission to provide you with exceptional heart care, we have created designated Provider Care Teams.  These Care Teams include your primary Cardiologist (physician) and Advanced Practice Providers (APPs -  Physician Assistants and Nurse Practitioners) who all work together to provide you with the care you need, when you need it. You will need a follow up appointment in 6 weeks.  Please call our office 2 months in advance to schedule this appointment.  You may see Carlyle Dolly, MD or one of the following Advanced Practice Providers on your designated Care Team:   Bernerd Pho, PA-C [facility]) . Ermalinda Barrios, PA-C ([place])  Any Other Special Instructions Will Be Listed Below (If Applicable). Thank you for choosing [place]!

## 2018-11-13 NOTE — Progress Notes (Signed)
Clinical Summary Toni [name] is a 63 y.o.female seen today for a focused visit on recent issues SOB/DOE and fluid overload   1. Mitral regurgitation - history of previous MV repair at [facility] 02/2015 - Radical Repair with resection of P2 Prolapse, 26 mm Physio II Ring Annuloplasty, Tricuspid Valve Repair (26 mm Ring Annuloplasty, Cox Maze IV Procedure on 02/27/2015 - cath prior to surgery showed no significant CAD - post repair echo 02/2015 showed no MR, mild TR. There are no reported gradients across the valves in the report - has had some recent SOB. Increased DOE over the last few months, at 1/2 block. Subjective abdominal distension. +orthopnea x 3 pillows which is chronic. Reports some weight gain.   02/2015 PFTs normal(on care everywhere) -10/2017echo showed elevated gradients across her repaired MV and TV consistent with post repair stenosis.  - TEE 10/2016 showed restricted motion of posterior MV leaflet with mitral stenosis, mean resting gradient of 8 mmHg. - 10/2017 echo LVEF 45-50%, mean grad MV 11 mmHg.PASP 58. Moderate RV dysfunction 10/2018 echo LVEF 40-45%, LVEF 40-45%, MV mean grad 5, PASP 46  -some recent increase in SOB.   - on toresemide 40mg  daily since last visit, mild improvement in edema, no change in breathing. home weights 227 to 228 lbs, only mild downtrend since diuretic change   2.Pulm edema - noted on 09/2018 CXR. BNP up to 2514.  - she increased torsemide to 40mg  x 5 days about 1 month ago - not much improvement in symptoms. Had some weight gain, baseline 225 up to 232 lbs.  - DOE with exertion, can have some chest heaviness. Some cough, some wheezing. Did have a course of prednisone and abx.      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 Congestive heart failure, unspecified    Diastolic heart failure  . Degeneration of lumbar or lumbosacral intervertebral disc     . Depression   . Diverticulosis   . Dyspnea     chronic multifactorial large component of deconditioning  . Esophageal reflux   . Gout   . History of kidney stones   . Insomnia    Working third shift  . Mitral stenosis    Secondary to under-sized ring annuloplasty following mitral valve repair  . Mitral valve disorders(424.0)    Mild to moderate mitral regurgitation  . Mitral valve insufficiency and aortic valve insufficiency   . Morbid obesity ([place])   . Normocytic anemia   . Other chest pain   . Other dyspnea and respiratory abnormality   . Paroxysmal atrial fibrillation ([name])    Stable no recurrence  . Peripheral edema    chronic  . S/P Maze operation for atrial fibrillation 02/27/2015  . S/P mitral valve repair 02/27/2015   "radical repair" including resection of P2 segment of posterior leaflet with 26 mm Edwards Physio II annuloplasty ring  . S/P tricuspid valve repair 02/27/2015   26 mm annuloplasty ring  . Unspecified essential hypertension      Allergies  Allergen Reactions  . Iodinated Diagnostic Agents Swelling    EYES     Current Outpatient Medications  Medication Sig Dispense Refill  . acetaminophen (TYLENOL) 500 MG tablet Take 1,000 mg by mouth daily as needed for moderate pain or headache.    . albuterol (PROVENTIL HFA;VENTOLIN HFA) 108 (90 Base) MCG/ACT inhaler Inhale 2 puffs into the lungs every 6 (six)  hours as needed for wheezing or shortness of breath. 1 Inhaler 3  . amLODipine (NORVASC) 5 MG tablet Take 1 tablet (5 mg total) by mouth daily. 90 tablet 1  . aspirin EC 81 MG tablet Take 1 tablet (81 mg total) by mouth daily. 90 tablet 3  . benzonatate (TESSALON) 100 MG capsule Take 1-2 capsules by mouth 3 (three) times daily as needed. cough  2  . FLUoxetine (PROZAC) 40 MG capsule TAKE 1 CAPSULE BY MOUTH EVERY DAY 90 capsule 1  . gabapentin (NEURONTIN) 300 MG capsule TAKE 1 CAPSULE BY MOUTH IN THE MORNING AND 2 CAPSULES IN THE EVENING 270 capsule 0  .  guaiFENesin (MUCINEX) 600 MG 12 hr tablet Take 600 mg by mouth 2 (two) times daily as needed (congestion).    . indomethacin (INDOCIN) 25 MG capsule Take 1 capsule by mouth 3 (three) times daily as needed. gout  1  . losartan (COZAAR) 100 MG tablet Take 1 tablet (100 mg total) by mouth daily. 30 tablet 11  . magnesium gluconate (MAGONATE) 500 MG tablet Take 500 mg by mouth daily.    . methocarbamol (ROBAXIN) 500 MG tablet Take 1 tablet (500 mg total) by mouth every 8 (eight) hours as needed for muscle spasms. 30 tablet 0  . metoprolol tartrate (LOPRESSOR) 100 MG tablet TAKE 1 TABLET BY MOUTH TWICE A DAY 180 tablet 2  . omeprazole (PRILOSEC) 40 MG capsule TAKE 1 CAPSULE BY MOUTH EVERY DAY 90 capsule 1  . potassium chloride (MICRO-K) 10 MEQ CR capsule Take 1 capsule (10 mEq total) by mouth daily. 90 capsule 1  . torsemide (DEMADEX) 20 MG tablet Take 2 tablets (40 mg total) by mouth daily. 180 tablet 3  . trolamine salicylate (ASPERCREME) 10 % cream Apply 1 application topically as needed for muscle pain.     No current facility-administered medications for this visit.      Past Surgical History:  Procedure Laterality Date  . APPENDECTOMY    . AXILLARY LYMPH NODE DISSECTION Left 01/04/2018   Procedure: LEFT AXILLARY LYMPH NODE DISSECTION;  Surgeon: [name], MD;  Location: [facility];  Service: General;  Laterality: Left;  . BREAST BIOPSY Left 2009  . CARDIAC VALVE SURGERY  02/27/2015   [facility]. Mitral Valve Repair. Radical Repair with resection of P2 Prolapse, 26 mm Physio II Ring Annuloplasty, Tricuspid Valve Repair (26 mm Ring Annuloplasty, Cox Maze IV Procedure on 02/27/2015  . CHOLECYSTECTOMY    . COLONOSCOPY  2011   Dr. Laural Golden: 50mm polyp from hepatic flexure (tubular adenoma)  . COLONOSCOPY N/A 11/10/2016   Dr. Gala Romney: Diverticulosis. Next colonoscopy 7 years, January 2025  . ESOPHAGOGASTRODUODENOSCOPY  2005    Dr. Gala Romney : normal esophagus s/p dilation. Small hiatal hernia noted.  Felt improved after dilation.   . ESOPHAGOGASTRODUODENOSCOPY N/A 11/10/2016   Dr. Gala Romney: Moderate-sized hiatal hernia, esophagus normal status post empiric dilation for history of dysphagia  . HEEL SPUR SURGERY Right   . MALONEY DILATION N/A 11/10/2016   Procedure: Venia Minks DILATION;  Surgeon: Daneil Dolin, MD;  Location: [facility];  Service: Endoscopy;  Laterality: N/A;  . MINIMALLY INVASIVE TRICUSPID VALVE REPAIR  02/27/2015   [place]  . MITRAL VALVE REPAIR  02/27/2015   [place]  . PARTIAL MASTECTOMY WITH NEEDLE LOCALIZATION Left 01/04/2018   Procedure: LEFT PARTIAL MASTECTOMY STATUS POST NEEDLE LOCALIZATION;  Surgeon: [name], MD;  Location: [facility];  Service: General;  Laterality: Left;  .  TEE WITHOUT CARDIOVERSION N/A 10/18/2016   Procedure: TRANSESOPHAGEAL ECHOCARDIOGRAM (TEE);  Surgeon: Arnoldo Lenis, MD;  Location: [facility];  Service: Endoscopy;  Laterality: N/A;  . TOTAL ABDOMINAL HYSTERECTOMY    . TOTAL KNEE ARTHROPLASTY Right   . TUBAL LIGATION       Allergies  Allergen Reactions  . Iodinated Diagnostic Agents Swelling    EYES      Family History  Problem Relation Age of Onset  . Hypertension Mother   . Heart disease Mother   . Kidney disease Father   . Stroke Father   . Diabetes Sister   . Kidney disease Sister   . Heart disease Sister   . Hypertension Sister   . Heart disease Brother   . Heart attack Son   . Hypertension Son   . Cancer Paternal Grandmother        Breast Cancer  . Heart disease Brother   . Colon cancer Neg Hx      Social History Ms. Pack reports that she quit smoking about 23 years ago. Her smoking use included cigarettes. She has a 12.50 pack-year smoking history. She quit smokeless tobacco use about 45 years ago.  Her smokeless tobacco use included chew. Ms. Lynde reports no history of alcohol use.   Review of Systems CONSTITUTIONAL: No weight loss, fever,  chills, weakness or fatigue.  HEENT: Eyes: No visual loss, blurred vision, double vision or yellow sclerae.No hearing loss, sneezing, congestion, runny nose or sore throat.  SKIN: No rash or itching.  CARDIOVASCULAR: per hpi RESPIRATORY: per hpi GASTROINTESTINAL: No anorexia, nausea, vomiting or diarrhea. No abdominal pain or blood.  GENITOURINARY: No burning on urination, no polyuria NEUROLOGICAL: No headache, dizziness, syncope, paralysis, ataxia, numbness or tingling in the extremities. No change in bowel or bladder control.  MUSCULOSKELETAL: No muscle, back pain, joint pain or stiffness.  LYMPHATICS: No enlarged nodes. No history of splenectomy.  PSYCHIATRIC: No history of depression or anxiety.  ENDOCRINOLOGIC: No reports of sweating, cold or heat intolerance. No polyuria or polydipsia.  Marland Kitchen   Physical Examination Vitals:   11/13/18 1355  BP: 122/74  Pulse: (!) 58  SpO2: 94%   Filed Weights   11/13/18 1355  Weight: 230 lb (104.3 kg)    Gen: resting comfortably, no acute distress HEENT: no scleral icterus, pupils equal round and reactive, no palptable cervical adenopathy,  CV: RRR, no m/r/g, mildly elevated jvd Resp: mild crackles bilatearlly GI: abdomen is soft, non-tender, non-distended, normal bowel sounds, no hepatosplenomegaly MSK: extremities are warm, trace bilateral edema Skin: warm, no rash Neuro:  no focal deficits Psych: appropriate affect   Diagnostic Studies 08/2016 echo Study Conclusions  - Left ventricle: The cavity size was normal. Wall thickness was increased in a pattern of mild LVH. Systolic function was normal. The estimated ejection fraction was in the range of 50% to 55%. Wall motion was normal; there were no regional wall motion abnormalities. The study was not technically sufficient to allow evaluation of LV diastolic dysfunction due to atrial fibrillation. - Aortic valve: Mildly calcified annulus. Trileaflet; mildly thickened  leaflets. There was mild regurgitation. Valve area (VTI): 2.47 cm^2. Valve area (Vmax): 2.47 cm^2. Valve area (Vmean): 2.43 cm^2. - Mitral valve: The is a MV anular ring present s/p mitral valve repair. There is an increased gradient across the MV. Mean gradient 12 mmHg. - Left atrium: The atrium was severely dilated. - Right ventricle: The cavity size was mildly dilated. Systolic function was mildly to moderately  reduced. - Right atrium: The atrium was mildly dilated. - Tricuspid valve: A TV anular ring is present. Mean gradient across the TV of 7 mmHg. - Pulmonary arteries: Systolic pressure was mildly increased. PA peak pressure: 31 mm Hg (S). - Technically adequate study.   10/2016 TEE Study Conclusions  - Left ventricle: The cavity size was normal. Wall thickness was normal. Systolic function was normal. The estimated ejection fraction was in the range of 55% to 60%. - Mitral valve: The posterior leaflet is moderately thickened with severely restricted motion. There is a moderate to severe gradient across the MV of 8 mmHg. There was mild regurgitation. Mean gradient (D): 8 mm Hg. - Left atrium: The atrium was severely dilated. - Right atrium: No evidence of thrombus in the atrial cavity or appendage.   10/2017 echo Study Conclusions  - Left ventricle: The cavity size was normal. Wall thickness was increased in a pattern of mild LVH. Systolic function was mildly reduced. The estimated ejection fraction was in the range of 45% to 50%. Diffuse hypokinesis. The study is not technically sufficient to allow evaluation of LV diastolic function. - Aortic valve: Mildly calcified annulus. Trileaflet. Mean gradient (S): 3 mm Hg. Valve area (VTI): 2.21 cm^2. - Mitral valve: The findings are consistent with severe stenosis. There was trivial regurgitation. Mean gradient (D): 11 mm Hg. Planimetered valve area: 0.9 cm^2. Valve area by  continuity equation (using LVOT flow): 0.68 cm^2. - Left atrium: The atrium was severely dilated. - Right ventricle: The cavity size was mildly dilated. Systolic function was moderately reduced. - Right atrium: The atrium was mildly dilated. Central venous pressure (est): 15 mm Hg. - Atrial septum: No defect or patent foramen ovale was identified. - Tricuspid valve: There was mild regurgitation. - Pulmonary arteries: PA peak pressure: 58 mm Hg (S). - Pericardium, extracardiac: There was no pericardial effusion.  Impressions:  - Mild LVH with LVEF approximately 45-50%. Indeterminate diastolic function. Severe left atrial enlargement. Findings consistent with severe mitral stenosis as detailed above. Moderately reduced right ventricular contraction. Mild tricuspid regurgitation with elevated PASP estimated 58 mmHg.   10/2018 echo Study Conclusions  - Left ventricle: LVEF is depressed at approximately 40 to 45% with   hypokinesis worse in the inferior, inferoseptal walls. The cavity   size was normal. Wall thickness was normal. Systolic function was   mildly to moderately reduced. The estimated ejection fraction was   in the range of 40% to 45%. - Mitral valve: MV is thickened with restricted motion. Peak and   mean gradients through the valve are 21 and 5 mm Hg MVA by P T1/2   is 1.3 cm2 consistent with severe mitral stenosis Compared to   previous echo in 2018, MVA is smaller. - Left atrium: The atrium was severely dilated. - Right ventricle: Systolic function was mildly reduced. - Right atrium: The atrium was mildly dilated. - Pulmonary arteries: PA peak pressure: 46 mm Hg (S).    Assessment and Plan   1. Valvular heart disease/SOB/ Acute on chronic systolic HF - recent pulm edema on CXR and elevated BNP in setting of worsening SOB - no significant improvement with increased torsemide to 40mg  daily, increase to 60mg  daily - repeat echo essentially stable  with LV function mildly decreased around 45%, post MV repair stenosis and elevated PASP at 46 - follow symptoms with increased diuretic dosing - previouslly reevaluated by CT surgery a few years ago given post MV repair stenosis, recs were for continued medical  management.  - BMET/Mg in 1 week - since mildly decreased LV function change lopressor to Toprol 100mg  bid.   F/u 6 weeks  Arnoldo Lenis, M.D.

## 2018-11-14 ENCOUNTER — Encounter: Payer: Self-pay | Admitting: Cardiology

## 2018-11-17 ENCOUNTER — Ambulatory Visit: Payer: BLUE CROSS/BLUE SHIELD | Admitting: Internal Medicine

## 2018-11-18 ENCOUNTER — Emergency Department (HOSPITAL_COMMUNITY)
Admission: EM | Admit: 2018-11-18 | Discharge: 2018-11-18 | Disposition: A | Discharge status: Home / Self Care | Payer: PRIVATE HEALTH INSURANCE | Attending: Emergency Medicine | Admitting: Emergency Medicine

## 2018-11-18 ENCOUNTER — Encounter (HOSPITAL_COMMUNITY): Payer: Self-pay | Admitting: *Deleted

## 2018-11-18 ENCOUNTER — Emergency Department (HOSPITAL_COMMUNITY): Payer: PRIVATE HEALTH INSURANCE

## 2018-11-18 DIAGNOSIS — Z87891 Personal history of nicotine dependence: Secondary | ICD-10-CM | POA: Insufficient documentation

## 2018-11-18 DIAGNOSIS — S92325A Nondisplaced fracture of second metatarsal bone, left foot, initial encounter for closed fracture: Secondary | ICD-10-CM | POA: Diagnosis not present

## 2018-11-18 DIAGNOSIS — Z952 Presence of prosthetic heart valve: Secondary | ICD-10-CM | POA: Diagnosis not present

## 2018-11-18 DIAGNOSIS — Y9389 Activity, other specified: Secondary | ICD-10-CM | POA: Diagnosis not present

## 2018-11-18 DIAGNOSIS — N182 Chronic kidney disease, stage 2 (mild): Secondary | ICD-10-CM | POA: Insufficient documentation

## 2018-11-18 DIAGNOSIS — F419 Anxiety disorder, unspecified: Secondary | ICD-10-CM | POA: Insufficient documentation

## 2018-11-18 DIAGNOSIS — I130 Hypertensive heart and chronic kidney disease with heart failure and stage 1 through stage 4 chronic kidney disease, or unspecified chronic kidney disease: Secondary | ICD-10-CM | POA: Insufficient documentation

## 2018-11-18 DIAGNOSIS — Z9049 Acquired absence of other specified parts of digestive tract: Secondary | ICD-10-CM | POA: Diagnosis not present

## 2018-11-18 DIAGNOSIS — W010XXA Fall on same level from slipping, tripping and stumbling without subsequent striking against object, initial encounter: Secondary | ICD-10-CM | POA: Diagnosis not present

## 2018-11-18 DIAGNOSIS — S92335A Nondisplaced fracture of third metatarsal bone, left foot, initial encounter for closed fracture: Secondary | ICD-10-CM | POA: Insufficient documentation

## 2018-11-18 DIAGNOSIS — S92502A Displaced unspecified fracture of left lesser toe(s), initial encounter for closed fracture: Secondary | ICD-10-CM

## 2018-11-18 DIAGNOSIS — S92345A Nondisplaced fracture of fourth metatarsal bone, left foot, initial encounter for closed fracture: Secondary | ICD-10-CM | POA: Insufficient documentation

## 2018-11-18 DIAGNOSIS — Z96651 Presence of right artificial knee joint: Secondary | ICD-10-CM | POA: Insufficient documentation

## 2018-11-18 DIAGNOSIS — S92512A Displaced fracture of proximal phalanx of left lesser toe(s), initial encounter for closed fracture: Secondary | ICD-10-CM | POA: Insufficient documentation

## 2018-11-18 DIAGNOSIS — S99922A Unspecified injury of left foot, initial encounter: Secondary | ICD-10-CM | POA: Diagnosis present

## 2018-11-18 DIAGNOSIS — Z7982 Long term (current) use of aspirin: Secondary | ICD-10-CM | POA: Insufficient documentation

## 2018-11-18 DIAGNOSIS — F329 Major depressive disorder, single episode, unspecified: Secondary | ICD-10-CM | POA: Insufficient documentation

## 2018-11-18 DIAGNOSIS — I5032 Chronic diastolic (congestive) heart failure: Secondary | ICD-10-CM | POA: Diagnosis not present

## 2018-11-18 DIAGNOSIS — Y92000 Kitchen of unspecified non-institutional (private) residence as  the place of occurrence of the external cause: Secondary | ICD-10-CM | POA: Insufficient documentation

## 2018-11-18 DIAGNOSIS — Y998 Other external cause status: Secondary | ICD-10-CM | POA: Diagnosis not present

## 2018-11-18 DIAGNOSIS — Z79899 Other long term (current) drug therapy: Secondary | ICD-10-CM | POA: Insufficient documentation

## 2018-11-18 DIAGNOSIS — S92302A Fracture of unspecified metatarsal bone(s), left foot, initial encounter for closed fracture: Secondary | ICD-10-CM

## 2018-11-18 DIAGNOSIS — W19XXXA Unspecified fall, initial encounter: Secondary | ICD-10-CM

## 2018-11-18 MED ORDER — HYDROCODONE-ACETAMINOPHEN 5-325 MG PO TABS: ORAL_TABLET | ORAL | 0 refills | Status: DC

## 2018-11-18 NOTE — ED Provider Notes (Signed)
[facility] EMERGENCY DEPARTMENT Provider Note   CSN: [id number] Arrival date & time: 11/18/18  1302     History   Chief Complaint Chief Complaint  Patient presents with  . Fall    HPI Toni [name] is a 63 y.o. female.  HPI   Toni [name] is a 63 y.o. female who presents to the Emergency Department complaining of pain to her left foot, ankle, and knee.  Symptoms have been present for 3 days.  She states that she fell while bending over to get something out of a kitchen cabinet.  She states that she felt a sharp pain and "pop" in the toes of her left foot.  She has had difficulty with weightbearing and describes a constant throbbing pain in her foot with movement.  She is tried elevating her foot without relief.  She states her knee pain is minimal and feels like "soreness."  She has taken Tylenol without relief.  She denies open wounds, numbness, swelling of her knee, hip or back pain.   Past Medical History:  Diagnosis Date  . Congestive heart failure, unspecified    Diastolic heart failure  . Degeneration of lumbar or lumbosacral intervertebral disc   . Depression   . Diverticulosis   . Dyspnea     chronic multifactorial large component of deconditioning  . Esophageal reflux   . Gout   . History of kidney stones   . Insomnia    Working third shift  . Mitral stenosis    Secondary to under-sized ring annuloplasty following mitral valve repair  . Mitral valve disorders(424.0)    Mild to moderate mitral regurgitation  . Mitral valve insufficiency and aortic valve insufficiency   . Morbid obesity ([place])   . Normocytic anemia   . Other chest pain   . Other dyspnea and respiratory abnormality   . Paroxysmal atrial fibrillation ([name])    Stable no recurrence  . Peripheral edema    chronic  . S/P Maze operation for atrial fibrillation 02/27/2015  . S/P mitral valve repair 02/27/2015   "radical repair" including resection of P2 segment of posterior leaflet with 26 mm  Edwards Physio II annuloplasty ring  . S/P tricuspid valve repair 02/27/2015   26 mm annuloplasty ring  . Unspecified essential hypertension     Patient Active Problem List   Diagnosis Date Noted  . Elevated alkaline phosphatase level 05/10/2018  . Breast mass, left 01/04/2018  . Left breast mass   . Adenopathy   . Upper airway cough syndrome 11/24/2017  . Esophageal reflux 11/10/2017  . Neuropathic pain 09/16/2017  . Anemia of unknown etiology 08/17/2017  . Anxiety 08/17/2017  . Globus sensation 05/10/2017  . PNA (pneumonia) 04/18/2017  . CAP (community acquired pneumonia) 04/17/2017  . Elevated troponin 04/17/2017  . Mitral stenosis   . Dysphagia 10/20/2016  . Constipation 10/20/2016  . S/P mitral valve repair 02/27/2015  . S/P Maze operation for atrial fibrillation 02/27/2015  . S/P tricuspid valve repair 02/27/2015  . Chronic kidney disease, stage II (mild)   . Insomnia 09/02/2011  . Chronic back pain 09/02/2011  . EDEMA 01/18/2011  . HYPOKALEMIA 09/17/2010  . ANEMIA 09/11/2010  . Chronic diastolic heart failure ([place]) 08/28/2010  . OTH NONSPECIFIC ABNORM CV SYSTEM FUNCTION STUDY 08/11/2010  . PRECORDIAL PAIN 07/30/2010  . DOE (dyspnea on exertion) 12/26/2009  . ACUTE ON CHRONIC DIASTOLIC HEART FAILURE 50/27/7412  . MITRAL REGURGITATION 07/07/2009  . Essential hypertension 07/07/2009  . ATRIAL FIBRILLATION 07/07/2009  Past Surgical History:  Procedure Laterality Date  . APPENDECTOMY    . AXILLARY LYMPH NODE DISSECTION Left 01/04/2018   Procedure: LEFT AXILLARY LYMPH NODE DISSECTION;  Surgeon: [name], MD;  Location: [facility] ORS;  Service: General;  Laterality: Left;  . BREAST BIOPSY Left 2009  . CARDIAC VALVE SURGERY  02/27/2015   Mikel Cella. Mitral Valve Repair. Radical Repair with resection of P2 Prolapse, 26 mm Physio II Ring Annuloplasty, Tricuspid Valve Repair (26 mm Ring Annuloplasty, Cox Maze IV Procedure on 02/27/2015  . CHOLECYSTECTOMY    .  COLONOSCOPY  2011   Dr. Laural Golden: 85mm polyp from hepatic flexure (tubular adenoma)  . COLONOSCOPY N/A 11/10/2016   Dr. Gala Romney: Diverticulosis. Next colonoscopy 7 years, January 2025  . ESOPHAGOGASTRODUODENOSCOPY  2005    Dr. Gala Romney : normal esophagus s/p dilation. Small hiatal hernia noted. Felt improved after dilation.   . ESOPHAGOGASTRODUODENOSCOPY N/A 11/10/2016   Dr. Gala Romney: Moderate-sized hiatal hernia, esophagus normal status post empiric dilation for history of dysphagia  . HEEL SPUR SURGERY Right   . MALONEY DILATION N/A 11/10/2016   Procedure: Venia Minks DILATION;  Surgeon: Daneil Dolin, MD;  Location: [facility] ENDO SUITE;  Service: Endoscopy;  Laterality: N/A;  . MINIMALLY INVASIVE TRICUSPID VALVE REPAIR  02/27/2015   [place]  . MITRAL VALVE REPAIR  02/27/2015   [place]  . PARTIAL MASTECTOMY WITH NEEDLE LOCALIZATION Left 01/04/2018   Procedure: LEFT PARTIAL MASTECTOMY STATUS POST NEEDLE LOCALIZATION;  Surgeon: [name], MD;  Location: [facility] ORS;  Service: General;  Laterality: Left;  . TEE WITHOUT CARDIOVERSION N/A 10/18/2016   Procedure: TRANSESOPHAGEAL ECHOCARDIOGRAM (TEE);  Surgeon: Arnoldo Lenis, MD;  Location: [facility];  Service: Endoscopy;  Laterality: N/A;  . TOTAL ABDOMINAL HYSTERECTOMY    . TOTAL KNEE ARTHROPLASTY Right   . TUBAL LIGATION       OB History   No obstetric history on file.      Home Medications    Prior to Admission medications   Medication Sig Start Date End Date Taking? Authorizing Provider  acetaminophen (TYLENOL) 500 MG tablet Take 1,000 mg by mouth daily as needed for moderate pain or headache.    [provider]  albuterol (PROVENTIL HFA;VENTOLIN HFA) 108 (90 Base) MCG/ACT inhaler Inhale 2 puffs into the lungs every 6 (six) hours as needed for wheezing or shortness of breath. 06/29/17   Soyla Dryer, PA-C  amLODipine (NORVASC) 5 MG tablet Take 1 tablet (5 mg total) by mouth  daily. 06/02/18   Caren Macadam, MD  aspirin EC 81 MG tablet Take 1 tablet (81 mg total) by mouth daily. 11/15/17   Strader, Fransisco Hertz, PA-C  benzonatate (TESSALON) 100 MG capsule Take 1-2 capsules by mouth 3 (three) times daily as needed. cough 05/07/18   [provider]  FLUoxetine (PROZAC) 40 MG capsule TAKE 1 CAPSULE BY MOUTH EVERY DAY 06/02/18   Caren Macadam, MD  gabapentin (NEURONTIN) 300 MG capsule TAKE 1 CAPSULE BY MOUTH IN THE MORNING AND 2 CAPSULES IN THE EVENING 06/21/18   Caren Macadam, MD  guaiFENesin (MUCINEX) 600 MG 12 hr tablet Take 600 mg by mouth 2 (two) times daily as needed (congestion).    [provider]  HYDROcodone-acetaminophen (NORCO/VICODIN) 5-325 MG tablet Take one tab po q 4 hrs prn pain 11/18/18   Zyquan Crotty, PA-C  indomethacin (INDOCIN) 25 MG capsule Take 1 capsule by mouth 3 (three) times daily as needed. gout 03/06/18   [provider]  losartan (COZAAR) 100 MG tablet Take 1 tablet (100 mg total) by mouth daily. 11/30/17   Tanda Rockers, MD  magnesium gluconate (MAGONATE) 500 MG tablet Take 500 mg by mouth daily.    [provider]  methocarbamol (ROBAXIN) 500 MG tablet Take 1 tablet (500 mg total) by mouth every 8 (eight) hours as needed for muscle spasms. 06/02/18   Caren Macadam, MD  metoprolol succinate (TOPROL-XL) 100 MG 24 hr tablet Take 1 tablet (100 mg total) by mouth 2 (two) times daily. 11/13/18   Arnoldo Lenis, MD  omeprazole (PRILOSEC) 40 MG capsule TAKE 1 CAPSULE BY MOUTH EVERY DAY 04/25/18   Caren Macadam, MD  potassium chloride (MICRO-K) 10 MEQ CR capsule Take 1 capsule (10 mEq total) by mouth daily. 06/02/18   Caren Macadam, MD  torsemide (DEMADEX) 20 MG tablet Take 3 tablets (60 mg total) by mouth daily. 11/13/18 02/11/19  Arnoldo Lenis, MD  trolamine salicylate (ASPERCREME) 10 % cream Apply 1 application topically as needed for muscle pain.    [provider]    Family History Family History    Problem Relation Age of Onset  . Hypertension Mother   . Heart disease Mother   . Kidney disease Father   . Stroke Father   . Diabetes Sister   . Kidney disease Sister   . Heart disease Sister   . Hypertension Sister   . Heart disease Brother   . Heart attack Son   . Hypertension Son   . Cancer Paternal Grandmother        Breast Cancer  . Heart disease Brother   . Colon cancer Neg Hx     Social History Social History   Tobacco Use  . Smoking status: Former Smoker    Packs/day: 0.50    Years: 25.00    Pack years: 12.50    Types: Cigarettes    Last attempt to quit: 1997    Years since quitting: 23.0  . Smokeless tobacco: Former Systems developer    Types: Chew    Quit date: 30  . Tobacco comment: dipped tobacco during pregancy in 1975  Substance Use Topics  . Alcohol use: No  . Drug use: No     Allergies   Iodinated diagnostic agents   Review of Systems Review of Systems  Constitutional: Negative for chills and fever.  Respiratory: Negative for chest tightness and shortness of breath.   Cardiovascular: Negative for chest pain.  Musculoskeletal: Positive for arthralgias (Pain of her left foot, ankle, and knee). Negative for back pain, joint swelling and neck pain.  Skin: Negative for color change and wound.  Neurological: Negative for dizziness, syncope, weakness, light-headedness and numbness.     Physical Exam Updated Vital Signs BP (!) 124/56 (BP Location: Right Arm)   Pulse (!) 50   Temp 97.8 F (36.6 C) (Oral)   Resp 16   Ht 5\' 7"  (1.702 m)   Wt 102.1 kg   LMP  (LMP Unknown)   SpO2 93%   BMI 35.24 kg/m   Physical Exam Vitals signs and nursing note reviewed.  Constitutional:      General: She is not in acute distress.    Appearance: She is well-developed. She is not ill-appearing or toxic-appearing.  HENT:     Head: Normocephalic and atraumatic.  Neck:     Musculoskeletal: Normal range of motion. No muscular tenderness.  Cardiovascular:     Rate  and Rhythm: Normal rate and regular  rhythm.     Pulses: Normal pulses.  Pulmonary:     Effort: Pulmonary effort is normal.     Breath sounds: Normal breath sounds.  Musculoskeletal:        General: Tenderness and signs of injury present.     Comments: Diffuse tenderness to palpation of the dorsal aspect of the left foot.  Significant tenderness to the proximal fourth and fifth toes of the left foot.  No open wounds or significant edema.  Patient has full range of motion of the left knee.  No palpable effusion.  No bony deformities.  Skin:    General: Skin is warm.     Capillary Refill: Capillary refill takes less than 2 seconds.     Findings: No erythema or rash.  Neurological:     General: No focal deficit present.     Mental Status: She is alert.     Motor: No weakness or abnormal muscle tone.     Coordination: Coordination normal.      ED Treatments / Results  Labs (all labs ordered are listed, but only abnormal results are displayed) Labs Reviewed - No data to display  EKG None  Radiology Dg Ankle Complete Left  Result Date: 11/18/2018 CLINICAL DATA:  63 year old female with left ankle pain after falling in her kitchen the other day. EXAM: LEFT ANKLE COMPLETE - 3+ VIEW COMPARISON:  Concurrently obtained radiographs of the foot and knee FINDINGS: Incompletely imaged lucencies at the bases of the second, third and fourth metatarsals. Otherwise, no acute fracture or malalignment. Soft tissue swelling is present diffusely about the ankle. The ankle mortise remains congruent in the talar dome is intact. Mild degenerative changes present in the midfoot. Loss of the normal plantar arch. Small calcaneal spur. IMPRESSION: 1. No acute fracture, malalignment or ankle joint effusion. 2. See concurrently obtained but separately dictated radiographs of the foot for description of metatarsal fractures. 3. Loss of the normal plantar arch. Diagnosis of pes planus usually requires a weight-bearing  lateral radiograph of the foot. However, the current nonweightbearing images are suggestive. Electronically Signed   By: Jacqulynn Cadet M.D.   On: 11/18/2018 14:05   Dg Knee Complete 4 Views Left  Result Date: 11/18/2018 CLINICAL DATA:  63 year old female with left knee pain after falling in her kitchen the other day. EXAM: LEFT KNEE - COMPLETE 4+ VIEW COMPARISON:  Concurrently obtained radiographs of the left ankle and foot FINDINGS: No evidence of acute fracture, malalignment or knee joint effusion. Mild tricompartmental degenerative changes including loss of joint space height, subchondral sclerosis and osteophyte formation. No lytic or blastic osseous lesion. No focal soft tissue abnormality. IMPRESSION: Mild to moderate tricompartmental degenerative osteoarthritis. Electronically Signed   By: Jacqulynn Cadet M.D.   On: 11/18/2018 14:02   Dg Foot Complete Left  Result Date: 11/18/2018 CLINICAL DATA:  63 year old female with left foot pain after falling in her kitchen the other day. EXAM: LEFT FOOT - COMPLETE 3+ VIEW COMPARISON:  Concurrently obtained radiographs of the left ankle and left knee. FINDINGS: Linear lucencies are present throughout the bases of the second third and fourth metatarsals consistent with nondisplaced fractures. Additionally, there are nondisplaced fractures throughout the medial base of the proximal phalanx of the fourth and fifth digits. The bones appear diffusely osteopenic. Diffuse soft tissue swelling is present around the forefoot. There appears to be loss of the normal foot arch. Query pes planus. IMPRESSION: 1. Acute nondisplaced fractures through the bases of the second, third and fourth  metatarsals. 2. Acute minimally displaced fractures through the medial aspect of the base of the proximal phalanx of the fourth and fifth digits. 3. Loss of the normal plantar arch, query pes planus? Electronically Signed   By: Jacqulynn Cadet M.D.   On: 11/18/2018 14:01     Procedures Procedures (including critical care time)  Medications Ordered in ED Medications - No data to display   Initial Impression / Assessment and Plan / ED Course  I have reviewed the triage vital signs and the nursing notes.  Pertinent labs & imaging results that were available during my care of the patient were reviewed by me and considered in my medical decision making (see chart for details).     Patient well-appearing.  Neurovascularly intact.  She has multiple nondisplaced fractures of the second third and fourth metatarsals and minimally displaced fractures to the base of the proximal phalanx of the fourth and fifth toes.  No open wounds.    Patient placed in a cam walker boot She agrees to elevate and close follow-up with podiatry or orthopedics.  Narcotic database reviewed.    Final Clinical Impressions(s) / ED Diagnoses   Final diagnoses:  Multiple closed fractures of metatarsal bone of left foot, initial encounter  Closed fracture of phalanx of left fourth toe, initial encounter  Closed fracture of phalanx of left fifth toe, initial encounter    ED Discharge Orders         Ordered    HYDROcodone-acetaminophen (NORCO/VICODIN) 5-325 MG tablet     11/18/18 1436           Kem Parkinson, PA-C 11/19/18 1631    Milton Ferguson, MD 11/20/18 1549

## 2018-11-18 NOTE — Discharge Instructions (Addendum)
Elevate your foot when possible.  You may remove the boot for sleeping and for bathing, but otherwise you will need to keep it on.  Call the podiatrist (Dr. Caprice Beaver) or the orthopedic provider (Dr. Aline Brochure) to arrange a follow-up appointment.  Only take the hydrocodone if needed for pain.  It may cause constipation, so take a stool softener with it.

## 2018-11-18 NOTE — ED Triage Notes (Signed)
Pt with left foot swelling and pain since fall the other day while reaching in the kitchen.  Able to put some weight on it.

## 2018-11-21 ENCOUNTER — Other Ambulatory Visit: Payer: Self-pay | Admitting: Gastroenterology

## 2018-11-21 ENCOUNTER — Other Ambulatory Visit (HOSPITAL_COMMUNITY)
Admission: RE | Admit: 2018-11-21 | Discharge: 2018-11-21 | Disposition: A | Discharge status: Home / Self Care | Payer: PRIVATE HEALTH INSURANCE | Source: Ambulatory Visit | Attending: Cardiology | Admitting: Cardiology

## 2018-11-21 DIAGNOSIS — Z79899 Other long term (current) drug therapy: Secondary | ICD-10-CM | POA: Diagnosis not present

## 2018-11-21 DIAGNOSIS — I10 Essential (primary) hypertension: Secondary | ICD-10-CM | POA: Insufficient documentation

## 2018-11-21 LAB — BASIC METABOLIC PANEL
Anion gap: 9 (ref 5–15)
BUN: 31 mg/dL — ABNORMAL HIGH (ref 8–23)
CO2: 24 mmol/L (ref 22–32)
Calcium: 9.3 mg/dL (ref 8.9–10.3)
Chloride: 108 mmol/L (ref 98–111)
Creatinine, Ser: 1.33 mg/dL — ABNORMAL HIGH (ref 0.44–1.00)
GFR calc Af Amer: 49 mL/min — ABNORMAL LOW (ref >60)
GFR calc non Af Amer: 42 mL/min — ABNORMAL LOW (ref >60)
Glucose, Bld: 72 mg/dL (ref 70–99)
Potassium: 3.4 mmol/L — ABNORMAL LOW (ref 3.5–5.1)
Sodium: 141 mmol/L (ref 135–145)

## 2018-11-21 LAB — MAGNESIUM: Magnesium: 2.2 mg/dL (ref 1.7–2.4)

## 2018-11-27 ENCOUNTER — Telehealth: Payer: Self-pay | Admitting: *Deleted

## 2018-11-27 DIAGNOSIS — I10 Essential (primary) hypertension: Secondary | ICD-10-CM

## 2018-11-27 NOTE — Telephone Encounter (Signed)
Pt aware and voiced understanding - updated medication list and will mail lab orders to pt as requested

## 2018-11-27 NOTE — Telephone Encounter (Signed)
-----   Message from Arnoldo Lenis, MD sent at 11/24/2018 12:34 PM EST ----- Labs show toresmide 60mg  daily is a little bit too much for her, please change to 40mg  alternating days with 60mg . Repeat BMET/Mg in 2 weeks   Zandra Abts MD

## 2018-12-01 ENCOUNTER — Ambulatory Visit: Payer: BLUE CROSS/BLUE SHIELD | Admitting: Internal Medicine

## 2018-12-11 ENCOUNTER — Other Ambulatory Visit (HOSPITAL_COMMUNITY)
Admission: RE | Admit: 2018-12-11 | Discharge: 2018-12-11 | Disposition: A | Discharge status: Home / Self Care | Payer: PRIVATE HEALTH INSURANCE | Source: Ambulatory Visit | Attending: Cardiology | Admitting: Cardiology

## 2018-12-11 DIAGNOSIS — I10 Essential (primary) hypertension: Secondary | ICD-10-CM | POA: Diagnosis not present

## 2018-12-11 LAB — MAGNESIUM: Magnesium: 2 mg/dL (ref 1.7–2.4)

## 2018-12-11 LAB — BASIC METABOLIC PANEL
Anion gap: 9 (ref 5–15)
BUN: 27 mg/dL — ABNORMAL HIGH (ref 8–23)
CO2: 23 mmol/L (ref 22–32)
Calcium: 9.4 mg/dL (ref 8.9–10.3)
Chloride: 108 mmol/L (ref 98–111)
Creatinine, Ser: 1.17 mg/dL — ABNORMAL HIGH (ref 0.44–1.00)
GFR calc Af Amer: 57 mL/min — ABNORMAL LOW (ref >60)
GFR calc non Af Amer: 49 mL/min — ABNORMAL LOW (ref >60)
Glucose, Bld: 94 mg/dL (ref 70–99)
Potassium: 3.5 mmol/L (ref 3.5–5.1)
Sodium: 140 mmol/L (ref 135–145)

## 2018-12-15 ENCOUNTER — Telehealth: Payer: Self-pay | Admitting: *Deleted

## 2018-12-15 NOTE — Telephone Encounter (Signed)
LM to return call.

## 2018-12-15 NOTE — Telephone Encounter (Signed)
-----   Message from Merlene Laughter, RN sent at 12/15/2018  4:02 PM EST -----  ----- Message ----- From: Arnoldo Lenis, MD Sent: 12/15/2018   4:02 PM EST To: Merlene Laughter, RN  Labs look fine   Zandra Abts MD

## 2018-12-18 NOTE — Telephone Encounter (Signed)
Pt waled in for samples of Linzess 72 mcg. Samples given.

## 2018-12-20 NOTE — Telephone Encounter (Signed)
Message sent to MyChart - routed to pcp

## 2018-12-25 ENCOUNTER — Other Ambulatory Visit: Payer: Self-pay | Admitting: Internal Medicine

## 2019-01-05 ENCOUNTER — Ambulatory Visit (INDEPENDENT_AMBULATORY_CARE_PROVIDER_SITE_OTHER): Payer: PRIVATE HEALTH INSURANCE | Admitting: Cardiology

## 2019-01-05 ENCOUNTER — Encounter: Payer: Self-pay | Admitting: Cardiology

## 2019-01-05 VITALS — BP 130/70 | HR 56 | Ht 67.0 in | Wt 228.6 lb

## 2019-01-05 DIAGNOSIS — I10 Essential (primary) hypertension: Secondary | ICD-10-CM

## 2019-01-05 DIAGNOSIS — Z9889 Other specified postprocedural states: Secondary | ICD-10-CM | POA: Diagnosis not present

## 2019-01-05 DIAGNOSIS — R0602 Shortness of breath: Secondary | ICD-10-CM

## 2019-01-05 DIAGNOSIS — I5022 Chronic systolic (congestive) heart failure: Secondary | ICD-10-CM | POA: Diagnosis not present

## 2019-01-05 NOTE — Progress Notes (Signed)
Clinical Summary Toni [name] is a 63 y.o.female  seen today for a focused visit on recent issues SOB/DOE and fluid overload   1. Mitral regurgitation - history of previous MV repair at [facility] 02/2015 - Radical Repair with resection of P2 Prolapse, 26 mm Physio II Ring Annuloplasty, Tricuspid Valve Repair (26 mm Ring Annuloplasty, Cox Maze IV Procedure on 02/27/2015 - cath prior to surgery showed no significant CAD - post repair echo 02/2015 showed no MR, mild TR. There are no reported gradients across the valves in the report  02/2015 PFTs normal(on care everywhere) -10/2017echo showed elevated gradients across her repaired MV and TV consistent with post repair stenosis.  - TEE 10/2016 showed restricted motion of posterior MV leaflet with mitral stenosis, mean resting gradient of 8 mmHg. - 10/2017 echo LVEF 45-50%, mean grad MV 11 mmHg.PASP 58. Moderate RV dysfunction 10/2018 echo LVEF 40-45%, LVEF 40-45%, MV mean grad 5, PASP 46     2.Chronic systolic HF - noted on 91/6384 CXR. BNP up to 2514.  - 10/2018 echo LVEF 40-45%, mean MV gradient 5 - last visit we increased torsemide to 60mg  daily, later changed to 60mg  alternating with 40mg  due to elevation in Cr which improved with change  - - breathing is improving. Limiting sodium intake. - home weights 222.6 lbs and trending down.   3. Afib - no recurrecne since MAZE procedure, has not been on anticoag   4. 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 Congestive heart failure, unspecified    Diastolic heart failure  . Degeneration of lumbar or lumbosacral intervertebral disc   . Depression   . Diverticulosis   . Dyspnea     chronic multifactorial large component of deconditioning  . Esophageal reflux   . Gout   . History of kidney stones   . Insomnia    Working third shift  . Mitral  stenosis    Secondary to under-sized ring annuloplasty following mitral valve repair  . Mitral valve disorders(424.0)    Mild to moderate mitral regurgitation  . Mitral valve insufficiency and aortic valve insufficiency   . Morbid obesity ([place])   . Normocytic anemia   . Other chest pain   . Other dyspnea and respiratory abnormality   . Paroxysmal atrial fibrillation ([name])    Stable no recurrence  . Peripheral edema    chronic  . S/P Maze operation for atrial fibrillation 02/27/2015  . S/P mitral valve repair 02/27/2015   "radical repair" including resection of P2 segment of posterior leaflet with 26 mm Edwards Physio II annuloplasty ring  . S/P tricuspid valve repair 02/27/2015   26 mm annuloplasty ring  . Unspecified essential hypertension      Allergies  Allergen Reactions  . Iodinated Diagnostic Agents Swelling    EYES     Current Outpatient Medications  Medication Sig Dispense Refill  . acetaminophen (TYLENOL) 500 MG tablet Take 1,000 mg by mouth daily as needed for moderate pain or headache.    . albuterol (PROVENTIL HFA;VENTOLIN HFA) 108 (90 Base) MCG/ACT inhaler Inhale 2 puffs into the lungs every 6 (six) hours as needed for wheezing or shortness of breath. 1 Inhaler 3  . amLODipine (NORVASC) 5 MG tablet Take 1 tablet (5 mg total) by mouth daily. 90 tablet 1  . aspirin EC 81 MG tablet Take 1 tablet (  81 mg total) by mouth daily. 90 tablet 3  . benzonatate (TESSALON) 100 MG capsule Take 1-2 capsules by mouth 3 (three) times daily as needed. cough  2  . FLUoxetine (PROZAC) 40 MG capsule TAKE 1 CAPSULE BY MOUTH EVERY DAY 90 capsule 1  . gabapentin (NEURONTIN) 300 MG capsule TAKE 1 CAPSULE BY MOUTH IN THE MORNING AND 2 CAPSULES IN THE EVENING 270 capsule 0  . guaiFENesin (MUCINEX) 600 MG 12 hr tablet Take 600 mg by mouth 2 (two) times daily as needed (congestion).    Marland Kitchen HYDROcodone-acetaminophen (NORCO/VICODIN) 5-325 MG tablet Take one tab po q 4 hrs prn pain 10 tablet 0  .  indomethacin (INDOCIN) 25 MG capsule Take 1 capsule by mouth 3 (three) times daily as needed. gout  1  . LINZESS 72 MCG capsule TAKE 1 CAPSULE (72 MCG TOTAL) BY MOUTH DAILY BEFORE BREAKFAST. 30 capsule 11  . losartan (COZAAR) 100 MG tablet Take 1 tablet (100 mg total) by mouth daily. 30 tablet 11  . magnesium gluconate (MAGONATE) 500 MG tablet Take 500 mg by mouth daily.    . methocarbamol (ROBAXIN) 500 MG tablet Take 1 tablet (500 mg total) by mouth every 8 (eight) hours as needed for muscle spasms. 30 tablet 0  . metoprolol succinate (TOPROL-XL) 100 MG 24 hr tablet Take 1 tablet (100 mg total) by mouth 2 (two) times daily. 180 tablet 3  . omeprazole (PRILOSEC) 40 MG capsule TAKE 1 CAPSULE BY MOUTH EVERY DAY 90 capsule 1  . potassium chloride (MICRO-K) 10 MEQ CR capsule Take 1 capsule (10 mEq total) by mouth daily. 90 capsule 1  . torsemide (DEMADEX) 20 MG tablet Take 20 mg by mouth daily. Take 60 mg every other day alternating with 40 mg every other day    . trolamine salicylate (ASPERCREME) 10 % cream Apply 1 application topically as needed for muscle pain.     No current facility-administered medications for this visit.      Past Surgical History:  Procedure Laterality Date  . APPENDECTOMY    . AXILLARY LYMPH NODE DISSECTION Left 01/04/2018   Procedure: LEFT AXILLARY LYMPH NODE DISSECTION;  Surgeon: [name], MD;  Location: [facility];  Service: General;  Laterality: Left;  . BREAST BIOPSY Left 2009  . CARDIAC VALVE SURGERY  02/27/2015   Mikel Cella. Mitral Valve Repair. Radical Repair with resection of P2 Prolapse, 26 mm Physio II Ring Annuloplasty, Tricuspid Valve Repair (26 mm Ring Annuloplasty, Cox Maze IV Procedure on 02/27/2015  . CHOLECYSTECTOMY    . COLONOSCOPY  2011   Dr. Laural Golden: 36mm polyp from hepatic flexure (tubular adenoma)  . COLONOSCOPY N/A 11/10/2016   Dr. Gala Romney: Diverticulosis. Next colonoscopy 7 years, January 2025  . ESOPHAGOGASTRODUODENOSCOPY  2005    Dr. Gala Romney :  normal esophagus s/p dilation. Small hiatal hernia noted. Felt improved after dilation.   . ESOPHAGOGASTRODUODENOSCOPY N/A 11/10/2016   Dr. Gala Romney: Moderate-sized hiatal hernia, esophagus normal status post empiric dilation for history of dysphagia  . HEEL SPUR SURGERY Right   . MALONEY DILATION N/A 11/10/2016   Procedure: Venia Minks DILATION;  Surgeon: Daneil Dolin, MD;  Location: [facility];  Service: Endoscopy;  Laterality: N/A;  . MINIMALLY INVASIVE TRICUSPID VALVE REPAIR  02/27/2015   [place]  . MITRAL VALVE REPAIR  02/27/2015   [place]  . PARTIAL MASTECTOMY WITH NEEDLE LOCALIZATION Left 01/04/2018   Procedure: LEFT PARTIAL MASTECTOMY STATUS POST NEEDLE LOCALIZATION;  Surgeon: Curlene Labrum  C, MD;  Location: [facility];  Service: General;  Laterality: Left;  . TEE WITHOUT CARDIOVERSION N/A 10/18/2016   Procedure: TRANSESOPHAGEAL ECHOCARDIOGRAM (TEE);  Surgeon: Arnoldo Lenis, MD;  Location: [facility];  Service: Endoscopy;  Laterality: N/A;  . TOTAL ABDOMINAL HYSTERECTOMY    . TOTAL KNEE ARTHROPLASTY Right   . TUBAL LIGATION       Allergies  Allergen Reactions  . Iodinated Diagnostic Agents Swelling    EYES      Family History  Problem Relation Age of Onset  . Hypertension Mother   . Heart disease Mother   . Kidney disease Father   . Stroke Father   . Diabetes Sister   . Kidney disease Sister   . Heart disease Sister   . Hypertension Sister   . Heart disease Brother   . Heart attack Son   . Hypertension Son   . Cancer Paternal Grandmother        Breast Cancer  . Heart disease Brother   . Colon cancer Neg Hx      Social History Toni [name] reports that she quit smoking about 23 years ago. Her smoking use included cigarettes. She has a 12.50 pack-year smoking history. She quit smokeless tobacco use about 45 years ago.  Her smokeless tobacco use included chew. Toni [name] reports no history of alcohol  use.   Review of Systems CONSTITUTIONAL: No weight loss, fever, chills, weakness or fatigue.  HEENT: Eyes: No visual loss, blurred vision, double vision or yellow sclerae.No hearing loss, sneezing, congestion, runny nose or sore throat.  SKIN: No rash or itching.  CARDIOVASCULAR: no chest pain, no palpitations.  RESPIRATORY: No shortness of breath, cough or sputum.  GASTROINTESTINAL: No anorexia, nausea, vomiting or diarrhea. No abdominal pain or blood.  GENITOURINARY: No burning on urination, no polyuria NEUROLOGICAL: No headache, dizziness, syncope, paralysis, ataxia, numbness or tingling in the extremities. No change in bowel or bladder control.  MUSCULOSKELETAL: No muscle, back pain, joint pain or stiffness.  LYMPHATICS: No enlarged nodes. No history of splenectomy.  PSYCHIATRIC: No history of depression or anxiety.  ENDOCRINOLOGIC: No reports of sweating, cold or heat intolerance. No polyuria or polydipsia.  Marland Kitchen   Physical Examination Today's Vitals   01/05/19 1052  BP: 130/70  Pulse: (!) 56  SpO2: 91%  Weight: 228 lb 9.6 oz (103.7 kg)  Height: 5\' 7"  (1.702 m)   Body mass index is 35.8 kg/m.  Gen: resting comfortably, no acute distress HEENT: no scleral icterus, pupils equal round and reactive, no palptable cervical adenopathy,  CV: RRR, no m/r/g, no jvd Resp: Clear to auscultation bilaterally GI: abdomen is soft, non-tender, non-distended, normal bowel sounds, no hepatosplenomegaly MSK: extremities are warm, no edema.  Skin: warm, no rash Neuro:  no focal deficits Psych: appropriate affect   Diagnostic Studies  08/2016 echo Study Conclusions  - Left ventricle: The cavity size was normal. Wall thickness was increased in a pattern of mild LVH. Systolic function was normal. The estimated ejection fraction was in the range of 50% to 55%. Wall motion was normal; there were no regional wall motion abnormalities. The study was not technically sufficient to  allow evaluation of LV diastolic dysfunction due to atrial fibrillation. - Aortic valve: Mildly calcified annulus. Trileaflet; mildly thickened leaflets. There was mild regurgitation. Valve area (VTI): 2.47 cm^2. Valve area (Vmax): 2.47 cm^2. Valve area (Vmean): 2.43 cm^2. - Mitral valve: The is a MV anular ring present s/p mitral valve repair. There is an  increased gradient across the MV. Mean gradient 12 mmHg. - Left atrium: The atrium was severely dilated. - Right ventricle: The cavity size was mildly dilated. Systolic function was mildly to moderately reduced. - Right atrium: The atrium was mildly dilated. - Tricuspid valve: A TV anular ring is present. Mean gradient across the TV of 7 mmHg. - Pulmonary arteries: Systolic pressure was mildly increased. PA peak pressure: 31 mm Hg (S). - Technically adequate study.   10/2016 TEE Study Conclusions  - Left ventricle: The cavity size was normal. Wall thickness was normal. Systolic function was normal. The estimated ejection fraction was in the range of 55% to 60%. - Mitral valve: The posterior leaflet is moderately thickened with severely restricted motion. There is a moderate to severe gradient across the MV of 8 mmHg. There was mild regurgitation. Mean gradient (D): 8 mm Hg. - Left atrium: The atrium was severely dilated. - Right atrium: No evidence of thrombus in the atrial cavity or appendage.   10/2017 echo Study Conclusions  - Left ventricle: The cavity size was normal. Wall thickness was increased in a pattern of mild LVH. Systolic function was mildly reduced. The estimated ejection fraction was in the range of 45% to 50%. Diffuse hypokinesis. The study is not technically sufficient to allow evaluation of LV diastolic function. - Aortic valve: Mildly calcified annulus. Trileaflet. Mean gradient (S): 3 mm Hg. Valve area (VTI): 2.21 cm^2. - Mitral valve: The findings  are consistent with severe stenosis. There was trivial regurgitation. Mean gradient (D): 11 mm Hg. Planimetered valve area: 0.9 cm^2. Valve area by continuity equation (using LVOT flow): 0.68 cm^2. - Left atrium: The atrium was severely dilated. - Right ventricle: The cavity size was mildly dilated. Systolic function was moderately reduced. - Right atrium: The atrium was mildly dilated. Central venous pressure (est): 15 mm Hg. - Atrial septum: No defect or patent foramen ovale was identified. - Tricuspid valve: There was mild regurgitation. - Pulmonary arteries: PA peak pressure: 58 mm Hg (S). - Pericardium, extracardiac: There was no pericardial effusion.  Impressions:  - Mild LVH with LVEF approximately 45-50%. Indeterminate diastolic function. Severe left atrial enlargement. Findings consistent with severe mitral stenosis as detailed above. Moderately reduced right ventricular contraction. Mild tricuspid regurgitation with elevated PASP estimated 58 mmHg.   10/2018 echo Study Conclusions  - Left ventricle: LVEF is depressed at approximately 40 to 45% with hypokinesis worse in the inferior, inferoseptal walls. The cavity size was normal. Wall thickness was normal. Systolic function was mildly to moderately reduced. The estimated ejection fraction was in the range of 40% to 45%. - Mitral valve: MV is thickened with restricted motion. Peak and mean gradients through the valve are 21 and 5 mm Hg MVA by P T1/2 is 1.3 cm2 consistent with severe mitral stenosis Compared to previous echo in 2018, MVA is smaller. - Left atrium: The atrium was severely dilated. - Right ventricle: Systolic function was mildly reduced. - Right atrium: The atrium was mildly dilated. - Pulmonary arteries: PA peak pressure: 46 mm Hg (S).   Assessment and Plan   1. Valvular heart disease/SOB/ Chronic systolic HF - symptoms improved with diuretic adjustment - repeat  BMET/Mg in 1 month - EKG today shows NSR  2. HTN - at goal, cotninue current meds   Arnoldo Lenis, M.D.

## 2019-01-05 NOTE — Patient Instructions (Signed)
Medication Instructions:  Continue all current medications.   Labwork:  BMET, Mg - orders given today.   Please do in 1 month (around 02/03/2019).  Office will contact with results via phone or letter.     Follow-Up: 4 months   Any Other Special Instructions Will Be Listed Below (If Applicable).  If you need a refill on your cardiac medications before your next appointment, please call your pharmacy.

## 2019-02-05 IMAGING — US US BREAST BX W LOC DEV 1ST LESION IMG BX SPEC US GUIDE*L*
1 series · 13 of 21 positions shown · non-contrast
Comparison: Previous exam(s).

ADDENDUM:
Pathology of the left breast biopsy revealed FIBROCYSTIC CHANGES. NO
EVIDENCE OF MALIGNANCY.

Pathology of the left axillary lymph node revealed REACTIVE LYMPHOID
TISSUE. Microscopic comment: Flow cytometry does not show a
monoclonal B cell proliferation or abnormal T cell phenotype.
This was found to be discordant by Dr. Lamatsch.
Recommendation: Surgical referral for excision of both the left
breast mass and a left axillary lymph node for additional pathologic
evaluation.
At the patient's request, results and recommendations were relayed
to the patient by phone by Merida, Nasra on 10/14/17 at [DATE].
The patient stated she did well following the biopsies. Post biopsy
instructions were reviewed with the patient. All of her questions
were answered. She requested that the referral be made to [REDACTED] [REDACTED]. She was encouraged to contact Pramod Yadav
An appointment was made with Dr. Merida, Nasra for 10/27/17 at
[DATE] by Nedyialko Mitrani. The patient has been notified of the
appointment.
Addendum by Merida, Nasra on 10/20/17.
CLINICAL DATA: 61-year-old female with suspicious left breast mass
and left axillary lymphadenopathy.
EXAM:
ULTRASOUND GUIDED LEFT BREAST CORE NEEDLE BIOPSY AND LEFT AXILLARY
CORE NEEDLE BIOPSY

[Series 1: us breast bx w loc dev 1st lesion img bx spec us g · 0.07mm/px · 13 of 21 slices shown]
[im 1/21]
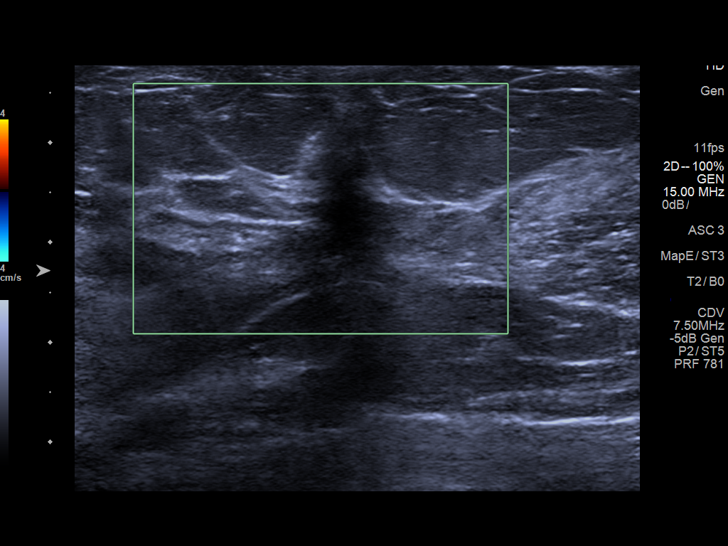
[im 3/21]
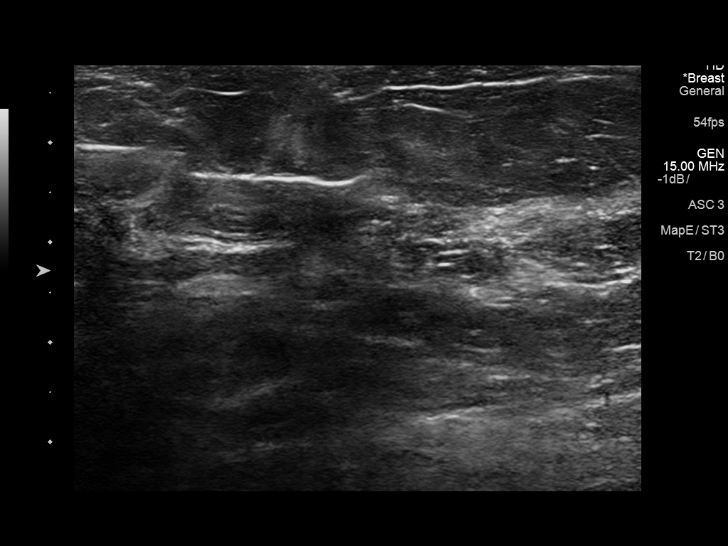
[im 5/21]
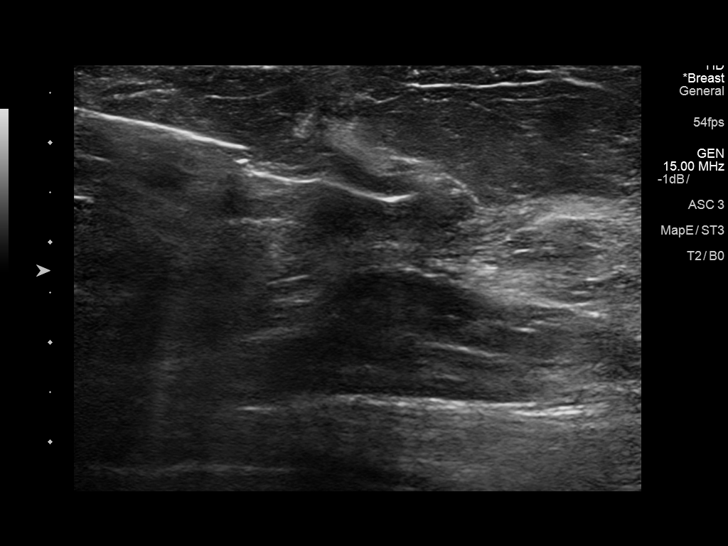
[im 6/21]
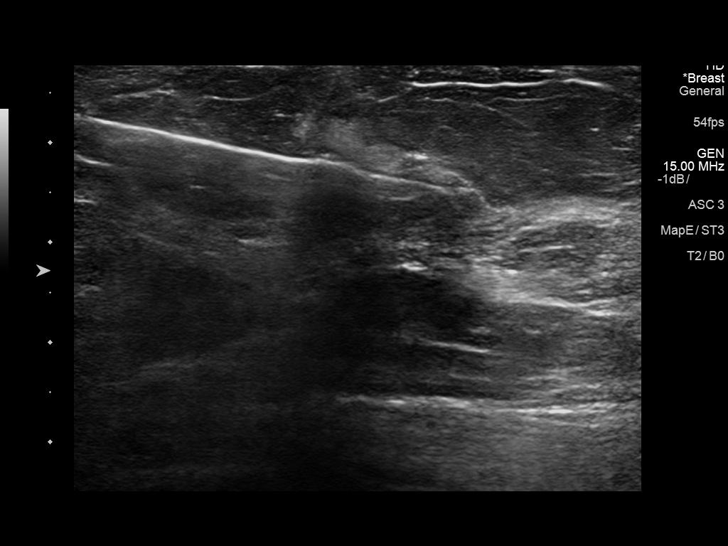
[im 8/21]
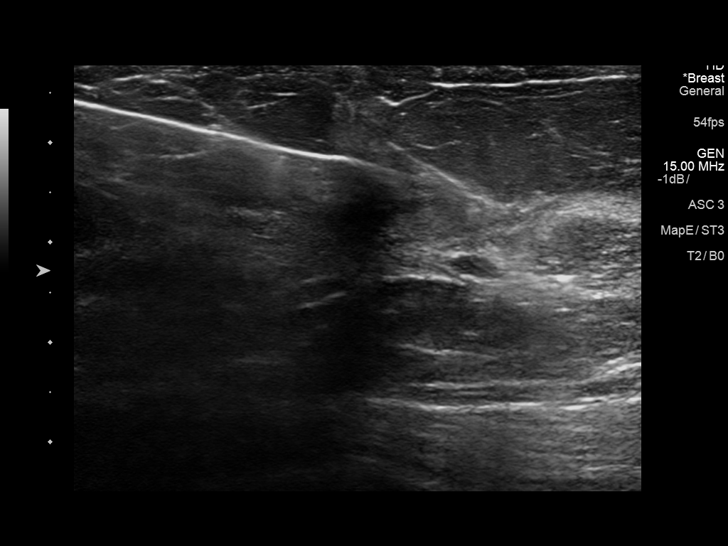
[im 9/21]
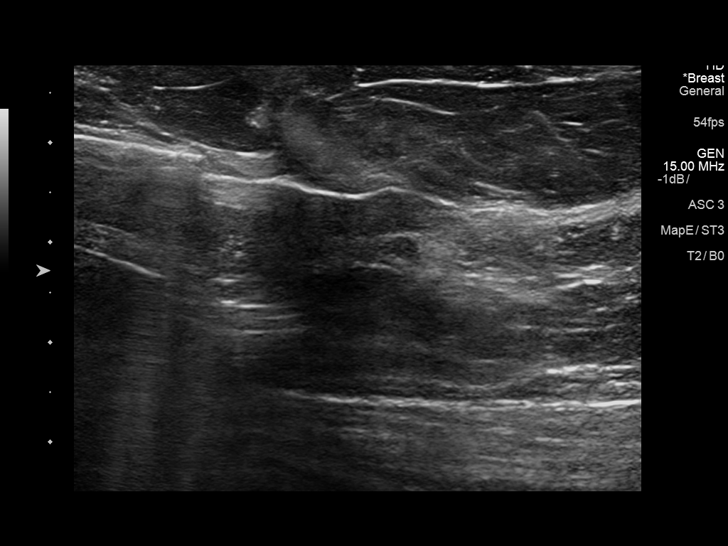
[im 11/21]
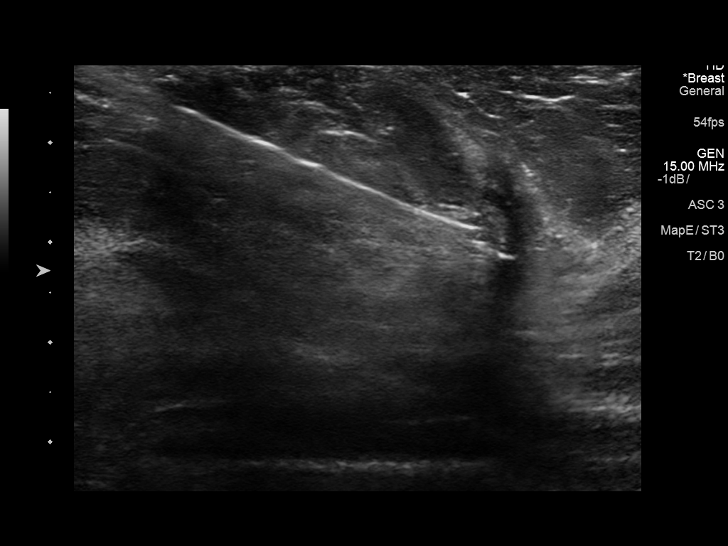
[im 13/21]
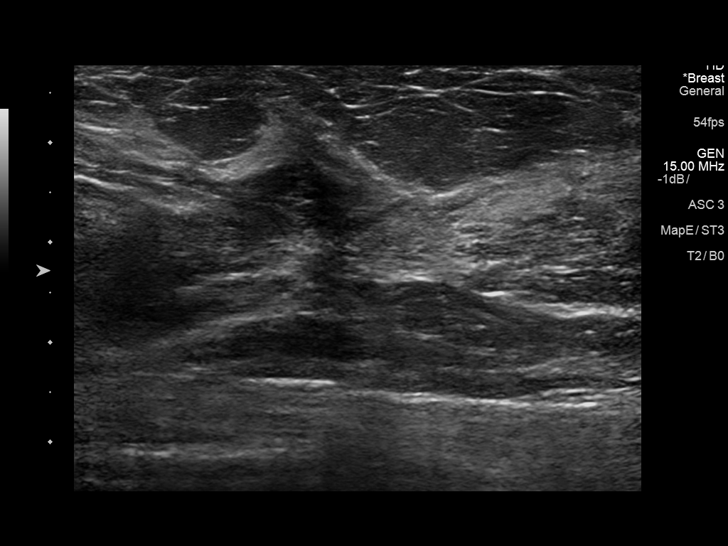
[im 14/21]
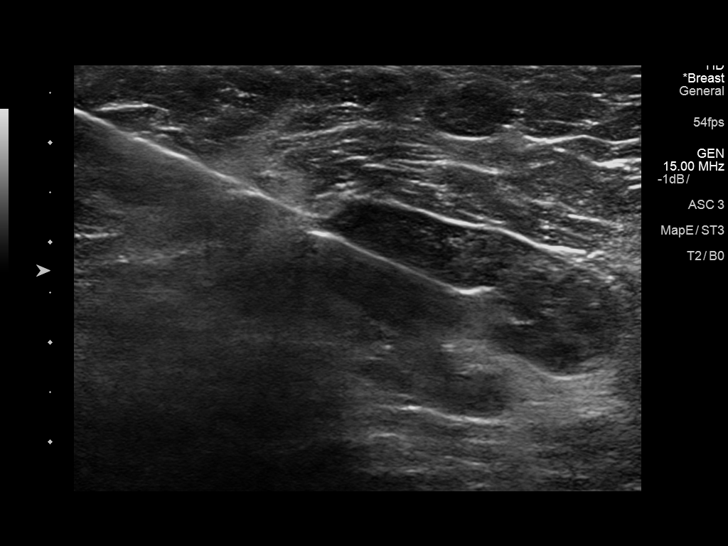
[im 16/21]
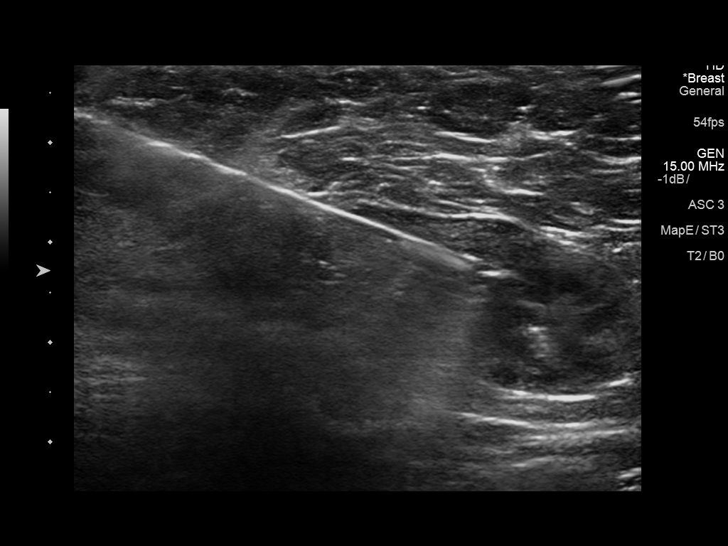
[im 17/21]
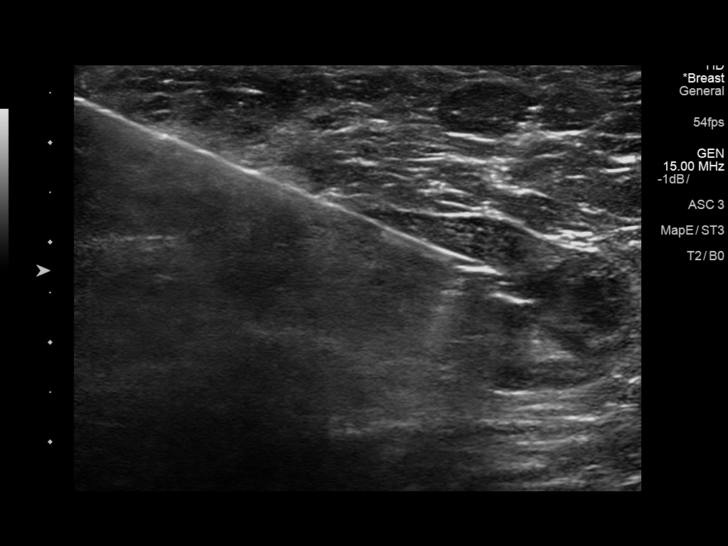
[im 19/21]
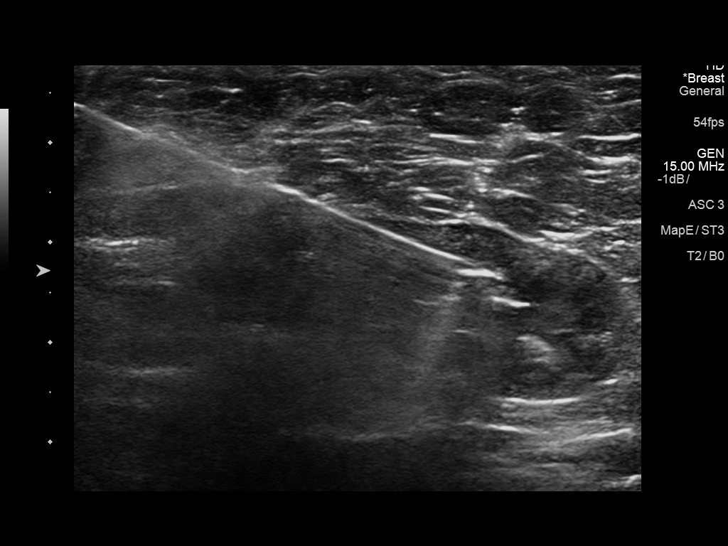
[im 21/21]
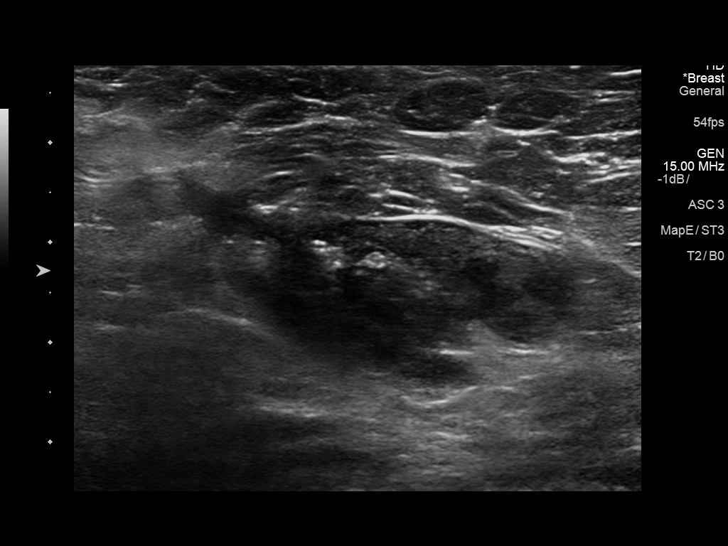

[13 of 21 positions shown; findings below may reference images not displayed]



Lesion quadrant: Upper-outer quadrant.

Using sterile technique and 1% Lidocaine as local anesthetic, under
direct ultrasound visualization, a 12 gauge Merida, Nasra device was
used to perform biopsy of a left breast mass using a lateral
approach. At the conclusion of the procedure a ribbon shaped tissue
marker clip was deployed into the biopsy cavity.

Using sterile technique and 1% Lidocaine as local anesthetic, under
direct ultrasound visualization, a 14 gauge [employer] device was
used to perform biopsy of a left axillary lymph node using a lateral
approach. At the conclusion of the procedure a wing shaped tissue
marker clip was deployed into the biopsy cavity.

Follow up 2 view mammogram was performed and dictated separately.
IMPRESSION: Ultrasound guided biopsy of a left breast mass and left axillary
lymph node. No apparent complications.

## 2019-02-08 ENCOUNTER — Other Ambulatory Visit: Payer: Self-pay

## 2019-02-08 DIAGNOSIS — D649 Anemia, unspecified: Secondary | ICD-10-CM

## 2019-03-19 ENCOUNTER — Telehealth: Payer: Self-pay

## 2019-03-19 MED ORDER — LUBIPROSTONE 24 MCG PO CAPS: ORAL_CAPSULE | ORAL | 5 refills | Status: DC

## 2019-03-19 NOTE — Telephone Encounter (Signed)
Noted. Pt notified and is ok with trying the Amitiza.

## 2019-03-19 NOTE — Telephone Encounter (Signed)
Please let patient know of information from [facility].   I will send in Amitiza 76mcg to take ONCE or TWICE daily.

## 2019-03-19 NOTE — Addendum Note (Signed)
Addended by: Mahala Menghini on: 03/19/2019 12:07 PM   Modules accepted: Orders

## 2019-03-19 NOTE — Telephone Encounter (Signed)
Received a Drug therapy Change Authorization form from [facility]. Pt is currently taking Linzess 72 mcg and the Alternative medication is Amitiza 72 mcg or 40mcg. Please advise on medication request change from [facility].

## 2019-04-18 ENCOUNTER — Other Ambulatory Visit: Payer: Self-pay | Admitting: Cardiology

## 2019-04-18 NOTE — Telephone Encounter (Signed)
Needing refill on gabapentin (NEURONTIN) 300 MG capsule [[id number]]   sent to [facility] in [facility]

## 2019-04-18 NOTE — Telephone Encounter (Signed)
[facility] would need to fill this medication

## 2019-05-01 ENCOUNTER — Encounter: Payer: Self-pay | Admitting: *Deleted

## 2019-05-01 ENCOUNTER — Other Ambulatory Visit: Payer: Self-pay

## 2019-05-01 ENCOUNTER — Ambulatory Visit (INDEPENDENT_AMBULATORY_CARE_PROVIDER_SITE_OTHER): Payer: PRIVATE HEALTH INSURANCE | Admitting: Physician Assistant

## 2019-05-01 ENCOUNTER — Encounter: Payer: Self-pay | Admitting: Physician Assistant

## 2019-05-01 VITALS — BP 129/75 | HR 55 | Temp 97.8°F | Ht 67.0 in | Wt 235.8 lb

## 2019-05-01 DIAGNOSIS — I5043 Acute on chronic combined systolic (congestive) and diastolic (congestive) heart failure: Secondary | ICD-10-CM | POA: Diagnosis not present

## 2019-05-01 DIAGNOSIS — I480 Paroxysmal atrial fibrillation: Secondary | ICD-10-CM | POA: Diagnosis not present

## 2019-05-01 DIAGNOSIS — I050 Rheumatic mitral stenosis: Secondary | ICD-10-CM

## 2019-05-01 DIAGNOSIS — Z9889 Other specified postprocedural states: Secondary | ICD-10-CM

## 2019-05-01 MED ORDER — POTASSIUM CHLORIDE CRYS ER 20 MEQ PO TBCR: 40.0000 meq | EXTENDED_RELEASE_TABLET | Freq: Every day | ORAL | 0 refills | Status: DC

## 2019-05-01 NOTE — Progress Notes (Signed)
Cardiology Office Note   Date:  05/01/2019   ID:  Toni [name] 08-26-1956, MRN [id number]  PCP:  Sinda Du, MD Cardiologist:  Carlyle Dolly, MD 01/05/2019 Toni Ferries, PA-C   No chief complaint on file.   History of Present Illness: Toni [name] is a 63 y.o. female with a history of MR s/p MV/TV repair w/ Cox-Maze at [facility] 2016, post-repair stenosis w/ mean gradient 5 mmHg, S-CHF, AF not anticoagulated, HTN, morbid obesity  Toni [name] presents for cardiology follow up.  She feels like her chest is full of fluid. When she takes the fluid pill, she urinates very frequently but does not feel like the fluid is decreasing.   She gets SOB w/ ambulating 50-100 feet. She has some LE edema, that was made worse by a gout med>>she stopped it. Does not remember the name.   She has to sleep on extra pillows because of drainage in the back of her throat, feels like she cannot breathe. Possible PND.  Does not see MDs at [facility] any more.   Feels palpitations on a regular basis.  They do not make her lightheaded or dizzy.  She feels the more so when she is exerting herself and getting short of breath.  Tries not to add salt, but there is salt in some foods she eats.   PCP does her labs.  She had some recently, and has an appointment for labs next week.   Past Medical History:  Diagnosis Date  . Congestive heart failure, unspecified    Diastolic heart failure  . Degeneration of lumbar or lumbosacral intervertebral disc   . Depression   . Diverticulosis   . Dyspnea     chronic multifactorial large component of deconditioning  . Esophageal reflux   . Gout   . History of kidney stones   . Insomnia    Working third shift  . Mitral stenosis    Secondary to under-sized ring annuloplasty following mitral valve repair  . Mitral valve disorders(424.0)    Mild to moderate mitral regurgitation  . Mitral valve insufficiency and aortic valve insufficiency   .  Morbid obesity ([place])   . Normocytic anemia   . Other chest pain   . Other dyspnea and respiratory abnormality   . Paroxysmal atrial fibrillation ([name])    Stable no recurrence  . Peripheral edema    chronic  . S/P Maze operation for atrial fibrillation 02/27/2015  . S/P mitral valve repair 02/27/2015   "radical repair" including resection of P2 segment of posterior leaflet with 26 mm Edwards Physio II annuloplasty ring  . S/P tricuspid valve repair 02/27/2015   26 mm annuloplasty ring  . Unspecified essential hypertension     Past Surgical History:  Procedure Laterality Date  . APPENDECTOMY    . AXILLARY LYMPH NODE DISSECTION Left 01/04/2018   Procedure: LEFT AXILLARY LYMPH NODE DISSECTION;  Surgeon: [name], MD;  Location: [facility] ORS;  Service: General;  Laterality: Left;  . BREAST BIOPSY Left 2009  . CARDIAC VALVE SURGERY  02/27/2015   Mikel Cella. Mitral Valve Repair. Radical Repair with resection of P2 Prolapse, 26 mm Physio II Ring Annuloplasty, Tricuspid Valve Repair (26 mm Ring Annuloplasty, Cox Maze IV Procedure on 02/27/2015  . CHOLECYSTECTOMY    . COLONOSCOPY  2011   Dr. Laural Golden: 38mm polyp from hepatic flexure (tubular adenoma)  . COLONOSCOPY N/A 11/10/2016   Dr. Gala Romney: Diverticulosis. Next colonoscopy 7 years, January 2025  . ESOPHAGOGASTRODUODENOSCOPY  2005    Dr. Gala Romney : normal esophagus s/p dilation. Small hiatal hernia noted. Felt improved after dilation.   . ESOPHAGOGASTRODUODENOSCOPY N/A 11/10/2016   Dr. Gala Romney: Moderate-sized hiatal hernia, esophagus normal status post empiric dilation for history of dysphagia  . HEEL SPUR SURGERY Right   . MALONEY DILATION N/A 11/10/2016   Procedure: Venia Minks DILATION;  Surgeon: Daneil Dolin, MD;  Location: [facility];  Service: Endoscopy;  Laterality: N/A;  . MINIMALLY INVASIVE TRICUSPID VALVE REPAIR  02/27/2015   [place]  . MITRAL VALVE REPAIR  02/27/2015   [place]  .  PARTIAL MASTECTOMY WITH NEEDLE LOCALIZATION Left 01/04/2018   Procedure: LEFT PARTIAL MASTECTOMY STATUS POST NEEDLE LOCALIZATION;  Surgeon: [name], MD;  Location: [facility];  Service: General;  Laterality: Left;  . TEE WITHOUT CARDIOVERSION N/A 10/18/2016   Procedure: TRANSESOPHAGEAL ECHOCARDIOGRAM (TEE);  Surgeon: Arnoldo Lenis, MD;  Location: [facility];  Service: Endoscopy;  Laterality: N/A;  . TOTAL ABDOMINAL HYSTERECTOMY    . TOTAL KNEE ARTHROPLASTY Right   . TUBAL LIGATION      Current Outpatient Medications  Medication Sig Dispense Refill  . acetaminophen (TYLENOL) 500 MG tablet Take 1,000 mg by mouth daily as needed for moderate pain or headache.    . albuterol (PROVENTIL HFA;VENTOLIN HFA) 108 (90 Base) MCG/ACT inhaler Inhale 2 puffs into the lungs every 6 (six) hours as needed for wheezing or shortness of breath. 1 Inhaler 3  . amLODipine (NORVASC) 5 MG tablet Take 1 tablet (5 mg total) by mouth daily. 90 tablet 1  . aspirin EC 81 MG tablet Take 1 tablet (81 mg total) by mouth daily. 90 tablet 3  . baclofen (LIORESAL) 20 MG tablet TAKE 1 TABLET WITH FOOD OR MILK AS NEEDED FOR MUSCLE SPASM EVERY 8 HOURS    . FLUoxetine (PROZAC) 40 MG capsule TAKE 1 CAPSULE BY MOUTH EVERY DAY 90 capsule 1  . gabapentin (NEURONTIN) 300 MG capsule TAKE 1 CAPSULE BY MOUTH IN THE MORNING AND 2 CAPSULES IN THE EVENING 270 capsule 0  . guaiFENesin (MUCINEX) 600 MG 12 hr tablet Take 600 mg by mouth 2 (two) times daily as needed (congestion).    Marland Kitchen LINZESS 72 MCG capsule TAKE 1 CAPSULE (72 MCG TOTAL) BY MOUTH DAILY BEFORE BREAKFAST. 30 capsule 11  . losartan (COZAAR) 100 MG tablet Take 1 tablet (100 mg total) by mouth daily. 30 tablet 11  . magnesium gluconate (MAGONATE) 500 MG tablet Take 500 mg by mouth daily.    . metoprolol succinate (TOPROL-XL) 100 MG 24 hr tablet Take 1 tablet (100 mg total) by mouth 2 (two) times daily. 180 tablet 3  . omeprazole (PRILOSEC) 40 MG capsule TAKE 1 CAPSULE BY  MOUTH EVERY DAY 90 capsule 1  . torsemide (DEMADEX) 20 MG tablet Take by mouth 2 (two) times daily.     Marland Kitchen trolamine salicylate (ASPERCREME) 10 % cream Apply 1 application topically as needed for muscle pain.     No current facility-administered medications for this visit.     Allergies:   Iodinated diagnostic agents    Social History:  The patient  reports that she quit smoking about 23 years ago. Her smoking use included cigarettes. She has a 12.50 pack-year smoking history. She quit smokeless tobacco use about 45 years ago.  Her smokeless tobacco use included chew. She reports that she does not drink alcohol or use drugs.   Family History:  The patient's family  history includes Cancer in her paternal grandmother; Diabetes in her sister; Heart attack in her son; Heart disease in her brother, brother, mother, and sister; Hypertension in her mother, sister, and son; Kidney disease in her father and sister; Stroke in her father.  She indicated that her mother is deceased. She indicated that her father is deceased. She indicated that her sister is alive. She indicated that only one of her two brothers is alive. She indicated that the status of her paternal grandmother is unknown. She indicated that her son is deceased. She indicated that the status of her neg hx is unknown.   ROS:  Please see the history of present illness. All other systems are reviewed and negative.    PHYSICAL EXAM: VS:  BP 129/75   Pulse (!) 55   Temp 97.8 F (36.6 C)   Ht 5\' 7"  (1.702 m)   Wt 235 lb 12.8 oz (107 kg)   LMP  (LMP Unknown)   SpO2 97%   BMI 36.93 kg/m  , BMI Body mass index is 36.93 kg/m. GEN: Well nourished, well developed, female in no acute distress HEENT: normal for age  Neck: JVD 1 cm, no carotid bruit, no masses Cardiac: RRR; soft murmur, no rubs, or gallops Respiratory: Some rales bases bilaterally, normal work of breathing GI: soft, nontender, nondistended, + BS MS: no deformity or atrophy;  1-2+ edema; distal pulses are 2+ in all 4 extremities  Skin: warm and dry, no rash Neuro:  Strength and sensation are intact Psych: euthymic mood, full affect   EKG:  EKG is not ordered today.  ECHO: 11/07/2018 - Left ventricle: LVEF is depressed at approximately 40 to 45% with   hypokinesis worse in the inferior, inferoseptal walls. The cavity   size was normal. Wall thickness was normal. Systolic function was   mildly to moderately reduced. The estimated ejection fraction was   in the range of 40% to 45%. - Mitral valve: MV is thickened with restricted motion. Peak and   mean gradients through the valve are 21 and 5 mm Hg MVA by P T1/2   is 1.3 cm2 consistent with severe mitral stenosis Compared to   previous echo in 2018, MVA is smaller. - Left atrium: The atrium was severely dilated. - Right ventricle: Systolic function was mildly reduced. - Right atrium: The atrium was mildly dilated. - Pulmonary arteries: PA peak pressure: 46 mm Hg (S).  Recent Labs: 05/16/2018: ALT 22 09/01/2018: Hemoglobin 10.3; Platelets 155 09/26/2018: B Natriuretic Peptide 2,514.0 10/30/2018: TSH 0.507 12/11/2018: BUN 27; Creatinine, Ser 1.17; Magnesium 2.0; Potassium 3.5; Sodium 140  CBC    Component Value Date/Time   WBC 4.7 09/01/2018 1108   RBC 3.47 (L) 09/01/2018 1108   HGB 10.3 (L) 09/01/2018 1108   HCT 31.4 (L) 09/01/2018 1108   PLT 155 09/01/2018 1108   MCV 90.5 09/01/2018 1108   MCH 29.7 09/01/2018 1108   MCHC 32.8 09/01/2018 1108   RDW 14.0 09/01/2018 1108   LYMPHSABS 1,053 09/01/2018 1108   MONOABS 0.6 01/14/2018 2302   EOSABS 80 09/01/2018 1108   BASOSABS 28 09/01/2018 1108   CMP Latest Ref Rng & Units 12/11/2018 11/21/2018 10/30/2018  Glucose 70 - 99 mg/dL 94 72 85  BUN 8 - 23 mg/dL 27(H) 31(H) 24(H)  Creatinine 0.44 - 1.00 mg/dL 1.17(H) 1.33(H) 1.05(H)  Sodium 135 - 145 mmol/L 140 141 139  Potassium 3.5 - 5.1 mmol/L 3.5 3.4(L) 3.7  Chloride 98 - 111 mmol/L 108 108  111  CO2 22 -  32 mmol/L 23 24 23   Calcium 8.9 - 10.3 mg/dL 9.4 9.3 9.1  Total Protein 6.1 - 8.1 g/dL - - -  Total Bilirubin 0.2 - 1.2 mg/dL - - -  Alkaline Phos 38 - 126 U/L - - -  AST 10 - 35 U/L - - -  ALT 6 - 29 U/L - - -     Lipid Panel Lab Results  Component Value Date   CHOL 119 09/16/2017   HDL 37 (L) 09/16/2017   LDLCALC 67 09/16/2017   TRIG 70 09/16/2017   CHOLHDL 3.2 09/16/2017      Wt Readings from Last 3 Encounters:  05/01/19 235 lb 12.8 oz (107 kg)  01/05/19 228 lb 9.6 oz (103.7 kg)  11/18/18 225 lb (102.1 kg)     Other studies Reviewed: Additional studies/ records that were reviewed today include: Office notes, hospital records and testing.  ASSESSMENT AND PLAN:  1.  Acute on chronic combined systolic and diastolic CHF: - There is a contribution from her left ventricular dysfunction as well as her mitral stenosis - Increase torsemide to 40 mg twice daily for 3 days then resume previous dose. - To make sure her potassium is not dropping too low, I will supplement that with 40 mEq daily while on the twice daily torsemide -Keep appointment with PCP to get a BMET as planned - Give her instructions on sodium and fluid restrictions, hopefully, she can more vigilant about reading labels  2. Hx MV repair, Mitral stenosis: -See echo report above, the mitral stenosis was described as severe. -Dr. Harl Bowie to review and decide if an echo is needed prior to her follow-up with him  3.  PAF - Patient not anticoagulated since s/p Cox-Maze procedure at the time of her open heart surgery. - She is having palpitations, if she is still having them once her volume status is improved and there are no issues with her electrolytes, consider event monitor to determine if she is having more A. fib   Current medicines are reviewed at length with the patient today.  The patient does not have concerns regarding medicines.  The following changes have been made: Increase torsemide temporarily with  potassium supplementation  Labs/ tests ordered today include:  No orders of the defined types were placed in this encounter.    Disposition:   FU with Carlyle Dolly, MD  Signed, Toni Ferries, PA-C  05/01/2019 1:36 PM    [place] Phone: [phone]; Fax: [phone]

## 2019-05-01 NOTE — Patient Instructions (Addendum)
Medication Instructions:   Your physician has recommended you make the following change in your medication:   Take torsemide 40 mg by mouth twice daily for 3 days, then resume previous dose  Take potassium 40 meq by mouth daily for 3 days, then resume previous dose  Continue all other medications the same  Labwork:  Please get lab work with your family doctor next week as planned  Testing/Procedures:  NONE  Follow-Up:  Your physician recommends that you schedule a follow-up appointment in: 2-3 months with Dr. Harl Bowie  Any Other Special Instructions Will Be Listed Below (If Applicable).  Call our office if your weight is not back to baseline for a sooner appointment  Please limit sodium to 500 mg per meal or (no more than 1500 mg daily)  Limit your total fluid/liquid intake to 1500 ml or 1&1/2 quart per day  If you need a refill on your cardiac medications before your next appointment, please call your pharmacy.

## 2019-06-18 ENCOUNTER — Other Ambulatory Visit: Payer: Self-pay

## 2019-06-18 ENCOUNTER — Observation Stay (HOSPITAL_COMMUNITY)
Admission: EM | Admit: 2019-06-18 | Discharge: 2019-06-20 | Disposition: A | Discharge status: Home / Self Care | Payer: PRIVATE HEALTH INSURANCE | Attending: Family Medicine | Admitting: Family Medicine

## 2019-06-18 ENCOUNTER — Emergency Department (HOSPITAL_COMMUNITY): Payer: PRIVATE HEALTH INSURANCE

## 2019-06-18 ENCOUNTER — Encounter (HOSPITAL_COMMUNITY): Payer: Self-pay | Admitting: Emergency Medicine

## 2019-06-18 DIAGNOSIS — Z79899 Other long term (current) drug therapy: Secondary | ICD-10-CM | POA: Insufficient documentation

## 2019-06-18 DIAGNOSIS — F418 Other specified anxiety disorders: Secondary | ICD-10-CM | POA: Diagnosis not present

## 2019-06-18 DIAGNOSIS — Z91041 Radiographic dye allergy status: Secondary | ICD-10-CM | POA: Diagnosis not present

## 2019-06-18 DIAGNOSIS — I480 Paroxysmal atrial fibrillation: Secondary | ICD-10-CM | POA: Insufficient documentation

## 2019-06-18 DIAGNOSIS — Z823 Family history of stroke: Secondary | ICD-10-CM | POA: Diagnosis not present

## 2019-06-18 DIAGNOSIS — J441 Chronic obstructive pulmonary disease with (acute) exacerbation: Secondary | ICD-10-CM | POA: Insufficient documentation

## 2019-06-18 DIAGNOSIS — Z20828 Contact with and (suspected) exposure to other viral communicable diseases: Secondary | ICD-10-CM | POA: Insufficient documentation

## 2019-06-18 DIAGNOSIS — Z7982 Long term (current) use of aspirin: Secondary | ICD-10-CM | POA: Insufficient documentation

## 2019-06-18 DIAGNOSIS — J9601 Acute respiratory failure with hypoxia: Secondary | ICD-10-CM | POA: Insufficient documentation

## 2019-06-18 DIAGNOSIS — D500 Iron deficiency anemia secondary to blood loss (chronic): Secondary | ICD-10-CM | POA: Insufficient documentation

## 2019-06-18 DIAGNOSIS — Z841 Family history of disorders of kidney and ureter: Secondary | ICD-10-CM | POA: Diagnosis not present

## 2019-06-18 DIAGNOSIS — E876 Hypokalemia: Secondary | ICD-10-CM | POA: Diagnosis not present

## 2019-06-18 DIAGNOSIS — I110 Hypertensive heart disease with heart failure: Secondary | ICD-10-CM | POA: Diagnosis not present

## 2019-06-18 DIAGNOSIS — Z6835 Body mass index (BMI) 35.0-35.9, adult: Secondary | ICD-10-CM | POA: Insufficient documentation

## 2019-06-18 DIAGNOSIS — I5043 Acute on chronic combined systolic (congestive) and diastolic (congestive) heart failure: Secondary | ICD-10-CM | POA: Diagnosis not present

## 2019-06-18 DIAGNOSIS — K219 Gastro-esophageal reflux disease without esophagitis: Secondary | ICD-10-CM | POA: Diagnosis not present

## 2019-06-18 DIAGNOSIS — I10 Essential (primary) hypertension: Secondary | ICD-10-CM | POA: Diagnosis present

## 2019-06-18 DIAGNOSIS — Z96651 Presence of right artificial knee joint: Secondary | ICD-10-CM | POA: Diagnosis not present

## 2019-06-18 DIAGNOSIS — R7989 Other specified abnormal findings of blood chemistry: Secondary | ICD-10-CM | POA: Diagnosis not present

## 2019-06-18 DIAGNOSIS — F329 Major depressive disorder, single episode, unspecified: Secondary | ICD-10-CM | POA: Diagnosis not present

## 2019-06-18 DIAGNOSIS — D649 Anemia, unspecified: Secondary | ICD-10-CM | POA: Diagnosis present

## 2019-06-18 DIAGNOSIS — F419 Anxiety disorder, unspecified: Secondary | ICD-10-CM

## 2019-06-18 DIAGNOSIS — Z8249 Family history of ischemic heart disease and other diseases of the circulatory system: Secondary | ICD-10-CM | POA: Diagnosis not present

## 2019-06-18 DIAGNOSIS — I4581 Long QT syndrome: Secondary | ICD-10-CM | POA: Diagnosis not present

## 2019-06-18 DIAGNOSIS — Z87442 Personal history of urinary calculi: Secondary | ICD-10-CM | POA: Diagnosis not present

## 2019-06-18 DIAGNOSIS — Z87891 Personal history of nicotine dependence: Secondary | ICD-10-CM | POA: Diagnosis not present

## 2019-06-18 DIAGNOSIS — M109 Gout, unspecified: Secondary | ICD-10-CM | POA: Insufficient documentation

## 2019-06-18 DIAGNOSIS — R9431 Abnormal electrocardiogram [ECG] [EKG]: Secondary | ICD-10-CM | POA: Diagnosis present

## 2019-06-18 DIAGNOSIS — R0602 Shortness of breath: Secondary | ICD-10-CM | POA: Diagnosis present

## 2019-06-18 DIAGNOSIS — R778 Other specified abnormalities of plasma proteins: Secondary | ICD-10-CM | POA: Diagnosis present

## 2019-06-18 LAB — SARS CORONAVIRUS 2 BY RT PCR (HOSPITAL ORDER, PERFORMED IN ~~LOC~~ HOSPITAL LAB): SARS Coronavirus 2: NEGATIVE

## 2019-06-18 LAB — CBC WITH DIFFERENTIAL/PLATELET
Abs Immature Granulocytes: 0.01 10*3/uL (ref 0.00–0.07)
Basophils Absolute: 0 10*3/uL (ref 0.0–0.1)
Basophils Relative: 0 %
Eosinophils Absolute: 0.1 10*3/uL (ref 0.0–0.5)
Eosinophils Relative: 2 %
HCT: 33.7 % — ABNORMAL LOW (ref 36.0–46.0)
Hemoglobin: 10.4 g/dL — ABNORMAL LOW (ref 12.0–15.0)
Immature Granulocytes: 0 %
Lymphocytes Relative: 23 %
Lymphs Abs: 1.2 10*3/uL (ref 0.7–4.0)
MCH: 29.3 pg (ref 26.0–34.0)
MCHC: 30.9 g/dL (ref 30.0–36.0)
MCV: 94.9 fL (ref 80.0–100.0)
Monocytes Absolute: 0.4 10*3/uL (ref 0.1–1.0)
Monocytes Relative: 7 %
Neutro Abs: 3.5 10*3/uL (ref 1.7–7.7)
Neutrophils Relative %: 68 %
Platelets: 154 10*3/uL (ref 150–400)
RBC: 3.55 MIL/uL — ABNORMAL LOW (ref 3.87–5.11)
RDW: 15.4 % (ref 11.5–15.5)
WBC: 5.1 10*3/uL (ref 4.0–10.5)
nRBC: 0 % (ref 0.0–0.2)

## 2019-06-18 LAB — BRAIN NATRIURETIC PEPTIDE: B Natriuretic Peptide: 1113 pg/mL — ABNORMAL HIGH (ref 0.0–100.0)

## 2019-06-18 LAB — BASIC METABOLIC PANEL
Anion gap: 10 (ref 5–15)
BUN: 33 mg/dL — ABNORMAL HIGH (ref 8–23)
CO2: 23 mmol/L (ref 22–32)
Calcium: 9.1 mg/dL (ref 8.9–10.3)
Chloride: 108 mmol/L (ref 98–111)
Creatinine, Ser: 1.22 mg/dL — ABNORMAL HIGH (ref 0.44–1.00)
GFR calc Af Amer: 54 mL/min — ABNORMAL LOW (ref >60)
GFR calc non Af Amer: 46 mL/min — ABNORMAL LOW (ref >60)
Glucose, Bld: 115 mg/dL — ABNORMAL HIGH (ref 70–99)
Potassium: 3.3 mmol/L — ABNORMAL LOW (ref 3.5–5.1)
Sodium: 141 mmol/L (ref 135–145)

## 2019-06-18 LAB — TROPONIN I (HIGH SENSITIVITY): Troponin I (High Sensitivity): 25 ng/L — ABNORMAL HIGH (ref <18)

## 2019-06-18 LAB — MAGNESIUM: Magnesium: 2 mg/dL (ref 1.7–2.4)

## 2019-06-18 LAB — TSH: TSH: 0.863 u[IU]/mL (ref 0.350–4.500)

## 2019-06-18 MED ORDER — MAGNESIUM SULFATE 2 GM/50ML IV SOLN
2.0000 g | Freq: Once | INTRAVENOUS | Status: AC
  Administered 2019-06-19: 2 g via INTRAVENOUS
  Filled 2019-06-18: qty 50

## 2019-06-18 MED ORDER — FUROSEMIDE 10 MG/ML IJ SOLN
80.0000 mg | Freq: Once | INTRAMUSCULAR | Status: AC
  Administered 2019-06-18: 80 mg via INTRAMUSCULAR
  Filled 2019-06-18: qty 8

## 2019-06-18 MED ORDER — POTASSIUM CHLORIDE CRYS ER 20 MEQ PO TBCR
20.0000 meq | EXTENDED_RELEASE_TABLET | Freq: Two times a day (BID) | ORAL | Status: DC
  Administered 2019-06-19 – 2019-06-20 (×3): 20 meq via ORAL
  Filled 2019-06-18 (×3): qty 1

## 2019-06-18 MED ORDER — POTASSIUM CHLORIDE CRYS ER 20 MEQ PO TBCR
60.0000 meq | EXTENDED_RELEASE_TABLET | Freq: Once | ORAL | Status: AC
  Administered 2019-06-18: 60 meq via ORAL
  Filled 2019-06-18: qty 3

## 2019-06-18 MED ORDER — FUROSEMIDE 10 MG/ML IJ SOLN
40.0000 mg | Freq: Two times a day (BID) | INTRAMUSCULAR | Status: DC
  Administered 2019-06-19: 40 mg via INTRAVENOUS
  Filled 2019-06-18: qty 4

## 2019-06-18 MED ORDER — FUROSEMIDE 10 MG/ML IJ SOLN: 80.0000 mg | Freq: Once | INTRAMUSCULAR | Status: DC

## 2019-06-18 NOTE — ED Provider Notes (Signed)
[facility] EMERGENCY DEPARTMENT Provider Note   CSN: [id number] Arrival date & time: 06/18/19  2043     History   Chief Complaint Chief Complaint  Patient presents with  . Dizziness    HPI Toni [name] is a 63 y.o. female.     HPI   19F with dyspnea. Worsening over past ~2 weeks. Says she went to see her PCP today and told that she is in afib. She says they ambulated her int he office because she was feeling SOB and o2 sat down to 85%. They advised she come to the ER. She reports progressive dyspnea over the past couple weeks. Worsening leg edema over the past few days. She doubled up on her diuretic yesterday and says she lost  6 lbs since then. She still has significant swelling though. Denies CP. No cough. No fever.   Past Medical History:  Diagnosis Date  . Congestive heart failure, unspecified    Diastolic heart failure  . Degeneration of lumbar or lumbosacral intervertebral disc   . Depression   . Diverticulosis   . Dyspnea     chronic multifactorial large component of deconditioning  . Esophageal reflux   . Gout   . History of kidney stones   . Insomnia    Working third shift  . Mitral stenosis    Secondary to under-sized ring annuloplasty following mitral valve repair  . Mitral valve disorders(424.0)    Mild to moderate mitral regurgitation  . Mitral valve insufficiency and aortic valve insufficiency   . Morbid obesity ([place])   . Normocytic anemia   . Other chest pain   . Other dyspnea and respiratory abnormality   . Paroxysmal atrial fibrillation ([name])    Stable no recurrence  . Peripheral edema    chronic  . S/P Maze operation for atrial fibrillation 02/27/2015  . S/P mitral valve repair 02/27/2015   "radical repair" including resection of P2 segment of posterior leaflet with 26 mm Edwards Physio II annuloplasty ring  . S/P tricuspid valve repair 02/27/2015   26 mm annuloplasty ring  . Unspecified essential hypertension     Patient Active Problem  List   Diagnosis Date Noted  . Elevated alkaline phosphatase level 05/10/2018  . Breast mass, left 01/04/2018  . Left breast mass   . Adenopathy   . Upper airway cough syndrome 11/24/2017  . Esophageal reflux 11/10/2017  . Neuropathic pain 09/16/2017  . Anemia of unknown etiology 08/17/2017  . Anxiety 08/17/2017  . Globus sensation 05/10/2017  . PNA (pneumonia) 04/18/2017  . CAP (community acquired pneumonia) 04/17/2017  . Elevated troponin 04/17/2017  . Mitral stenosis   . Dysphagia 10/20/2016  . Constipation 10/20/2016  . S/P mitral valve repair 02/27/2015  . S/P Maze operation for atrial fibrillation 02/27/2015  . S/P tricuspid valve repair 02/27/2015  . Chronic kidney disease, stage II (mild)   . Insomnia 09/02/2011  . Chronic back pain 09/02/2011  . EDEMA 01/18/2011  . HYPOKALEMIA 09/17/2010  . ANEMIA 09/11/2010  . Chronic diastolic heart failure ([place]) 08/28/2010  . OTH NONSPECIFIC ABNORM CV SYSTEM FUNCTION STUDY 08/11/2010  . PRECORDIAL PAIN 07/30/2010  . DOE (dyspnea on exertion) 12/26/2009  . ACUTE ON CHRONIC DIASTOLIC HEART FAILURE 46/56/8127  . MITRAL REGURGITATION 07/07/2009  . Essential hypertension 07/07/2009  . ATRIAL FIBRILLATION 07/07/2009    Past Surgical History:  Procedure Laterality Date  . APPENDECTOMY    . AXILLARY LYMPH NODE DISSECTION Left 01/04/2018   Procedure: LEFT AXILLARY LYMPH NODE  DISSECTION;  Surgeon: [name], MD;  Location: [facility] ORS;  Service: General;  Laterality: Left;  . BREAST BIOPSY Left 2009  . CARDIAC VALVE SURGERY  02/27/2015   Mikel Cella. Mitral Valve Repair. Radical Repair with resection of P2 Prolapse, 26 mm Physio II Ring Annuloplasty, Tricuspid Valve Repair (26 mm Ring Annuloplasty, Cox Maze IV Procedure on 02/27/2015  . CHOLECYSTECTOMY    . COLONOSCOPY  2011   Dr. Laural Golden: 82mm polyp from hepatic flexure (tubular adenoma)  . COLONOSCOPY N/A 11/10/2016   Dr. Gala Romney: Diverticulosis. Next colonoscopy 7 years, January 2025  .  ESOPHAGOGASTRODUODENOSCOPY  2005    Dr. Gala Romney : normal esophagus s/p dilation. Small hiatal hernia noted. Felt improved after dilation.   . ESOPHAGOGASTRODUODENOSCOPY N/A 11/10/2016   Dr. Gala Romney: Moderate-sized hiatal hernia, esophagus normal status post empiric dilation for history of dysphagia  . HEEL SPUR SURGERY Right   . MALONEY DILATION N/A 11/10/2016   Procedure: Venia Minks DILATION;  Surgeon: Daneil Dolin, MD;  Location: [facility] ENDO SUITE;  Service: Endoscopy;  Laterality: N/A;  . MINIMALLY INVASIVE TRICUSPID VALVE REPAIR  02/27/2015   [place]  . MITRAL VALVE REPAIR  02/27/2015   [place]  . PARTIAL MASTECTOMY WITH NEEDLE LOCALIZATION Left 01/04/2018   Procedure: LEFT PARTIAL MASTECTOMY STATUS POST NEEDLE LOCALIZATION;  Surgeon: [name], MD;  Location: [facility] ORS;  Service: General;  Laterality: Left;  . TEE WITHOUT CARDIOVERSION N/A 10/18/2016   Procedure: TRANSESOPHAGEAL ECHOCARDIOGRAM (TEE);  Surgeon: Arnoldo Lenis, MD;  Location: [facility] SU[facility];  Service: Endoscopy;  Laterality: N/A;  . TOTAL ABDOMINAL HYSTERECTOMY    . TOTAL KNEE ARTHROPLASTY Right   . TUBAL LIGATION       OB History   No obstetric history on file.      Home Medications    Prior to Admission medications   Medication Sig Start Date End Date Taking? Authorizing Provider  acetaminophen (TYLENOL) 500 MG tablet Take 1,000 mg by mouth daily as needed for moderate pain or headache.   Yes [provider]  albuterol (PROVENTIL HFA;VENTOLIN HFA) 108 (90 Base) MCG/ACT inhaler Inhale 2 puffs into the lungs every 6 (six) hours as needed for wheezing or shortness of breath. 06/29/17  Yes Soyla Dryer, PA-C  amLODipine (NORVASC) 5 MG tablet Take 1 tablet (5 mg total) by mouth daily. 06/02/18  Yes Caren Macadam, MD  aspirin EC 81 MG tablet Take 1 tablet (81 mg total) by mouth daily. 11/15/17  Yes Strader, [place] M, PA-C  baclofen (LIORESAL) 20 MG tablet  Take 20 mg by mouth daily as needed for muscle spasms.  04/09/19  Yes [provider]  FLUoxetine (PROZAC) 40 MG capsule TAKE 1 CAPSULE BY MOUTH EVERY DAY Patient taking differently: Take 40 mg by mouth daily.  06/02/18  Yes Hagler, Apolonio Schneiders, MD  gabapentin (NEURONTIN) 300 MG capsule TAKE 1 CAPSULE BY MOUTH IN THE MORNING AND 2 CAPSULES IN THE EVENING Patient taking differently: Take 300-600 mg by mouth See admin instructions. 300mg  in the morning and 600mg  in the evening 06/21/18  Yes Hagler, Apolonio Schneiders, MD  guaiFENesin (MUCINEX) 600 MG 12 hr tablet Take 600 mg by mouth 2 (two) times daily as needed (congestion).   Yes [provider]  LINZESS 72 MCG capsule TAKE 1 CAPSULE (72 MCG TOTAL) BY MOUTH DAILY BEFORE BREAKFAST. Patient taking differently: Take 72 mcg by mouth daily before breakfast.  11/22/18  Yes Annitta Needs, NP  losartan (COZAAR) 100 MG  tablet Take 1 tablet (100 mg total) by mouth daily. 11/30/17  Yes Tanda Rockers, MD  magnesium gluconate (MAGONATE) 500 MG tablet Take 500 mg by mouth daily.   Yes [provider]  metoprolol succinate (TOPROL-XL) 100 MG 24 hr tablet Take 1 tablet (100 mg total) by mouth 2 (two) times daily. 11/13/18  Yes Branch, Alphonse Guild, MD  omeprazole (PRILOSEC) 40 MG capsule TAKE 1 CAPSULE BY MOUTH EVERY DAY Patient taking differently: Take 40 mg by mouth daily.  04/25/18  Yes Caren Macadam, MD  torsemide (DEMADEX) 20 MG tablet Take 20 mg by mouth 2 (two) times daily.    Yes [provider]  trolamine salicylate (ASPERCREME) 10 % cream Apply 1 application topically as needed for muscle pain.   Yes [provider]  allopurinol (ZYLOPRIM) 100 MG tablet Take 100 mg by mouth daily. 05/17/19   [provider]  potassium chloride SA (K-DUR) 20 MEQ tablet Take 2 tablets (40 mEq total) by mouth daily. For 3 days Patient not taking: Reported on 06/18/2019 05/01/19   Barrett, Evelene Croon, PA-C    Family History Family History  Problem  Relation Age of Onset  . Hypertension Mother   . Heart disease Mother   . Kidney disease Father   . Stroke Father   . Diabetes Sister   . Kidney disease Sister   . Heart disease Sister   . Hypertension Sister   . Heart disease Brother   . Heart attack Son   . Hypertension Son   . Cancer Paternal Grandmother        Breast Cancer  . Heart disease Brother   . Colon cancer Neg Hx     Social History Social History   Tobacco Use  . Smoking status: Former Smoker    Packs/day: 0.50    Years: 25.00    Pack years: 12.50    Types: Cigarettes    Quit date: 1997    Years since quitting: 23.6  . Smokeless tobacco: Former Systems developer    Types: Chew    Quit date: 76  . Tobacco comment: dipped tobacco during pregancy in 1975  Substance Use Topics  . Alcohol use: Yes  . Drug use: No     Allergies   Iodinated diagnostic agents   Review of Systems Review of Systems  All systems reviewed and negative, other than as noted in HPI.  Physical Exam Updated Vital Signs Pulse 66   Temp 98.3 F (36.8 C)   Resp 17   LMP  (LMP Unknown)   SpO2 92%   Physical Exam Vitals signs and nursing note reviewed.  Constitutional:      General: She is not in acute distress.    Appearance: She is well-developed. She is obese.  HENT:     Head: Normocephalic and atraumatic.  Eyes:     General:        Right eye: No discharge.        Left eye: No discharge.     Conjunctiva/sclera: Conjunctivae normal.  Neck:     Musculoskeletal: Neck supple.  Cardiovascular:     Rate and Rhythm: Normal rate and regular rhythm.     Heart sounds: Normal heart sounds. No murmur. No friction rub. No gallop.   Pulmonary:     Effort: Pulmonary effort is normal. No respiratory distress.     Breath sounds: Normal breath sounds.  Abdominal:     General: There is no distension.     Palpations:  Abdomen is soft.     Tenderness: There is no abdominal tenderness.  Musculoskeletal:        General: No tenderness.      Right lower leg: Edema present.     Left lower leg: Edema present.     Comments: Symmetric LE edema  Skin:    General: Skin is warm and dry.  Neurological:     Mental Status: She is alert.  Psychiatric:        Behavior: Behavior normal.        Thought Content: Thought content normal.      ED Treatments / Results  Labs (all labs ordered are listed, but only abnormal results are displayed) Labs Reviewed  CBC WITH DIFFERENTIAL/PLATELET - Abnormal; Notable for the following components:      Result Value   RBC 3.55 (*)    Hemoglobin 10.4 (*)    HCT 33.7 (*)    All other components within normal limits  BRAIN NATRIURETIC PEPTIDE - Abnormal; Notable for the following components:   B Natriuretic Peptide 1,113.0 (*)    All other components within normal limits  BASIC METABOLIC PANEL - Abnormal; Notable for the following components:   Potassium 3.3 (*)    Glucose, Bld 115 (*)    BUN 33 (*)    Creatinine, Ser 1.22 (*)    GFR calc non Af Amer 46 (*)    GFR calc Af Amer 54 (*)    All other components within normal limits  TROPONIN I (HIGH SENSITIVITY) - Abnormal; Notable for the following components:   Troponin I (High Sensitivity) 25 (*)    All other components within normal limits  SARS CORONAVIRUS 2 (HOSPITAL ORDER, [facility])  TSH  MAGNESIUM    EKG EKG Interpretation  Date/Time:  Monday June 18 2019 22:51:45 EDT Ventricular Rate:  66 PR Interval:    QRS Duration: 96 QT Interval:  521 QTC Calculation: 546 R Axis:   61 Text Interpretation:  Normal sinus rhythm Inferolateral infarct, old Prolonged QT interval Confirmed by Virgel Manifold [id number]) on 06/18/2019 11:29:27 PM   Radiology Dg Chest Portable 1 View  Result Date: 06/18/2019 CLINICAL DATA:  Dyspnea EXAM: PORTABLE CHEST 1 VIEW COMPARISON:  September 26, 2018 FINDINGS: There is cardiomegaly. Overlying median sternotomy wires and prosthetic mitral valve is seen. Aortic knob calcifications.  There is prominent pulmonary vasculature. No acute osseous abnormality. IMPRESSION: Cardiomegaly and pulmonary vascular congestion. Electronically Signed   By: Prudencio Pair M.D.   On: 06/18/2019 22:40    Procedures Procedures (including critical care time)  Medications Ordered in ED Medications  potassium chloride SA (K-DUR) CR tablet 60 mEq (has no administration in time range)  furosemide (LASIX) injection 80 mg (80 mg Intramuscular Given 06/18/19 2314)     Initial Impression / Assessment and Plan / ED Course  I have reviewed the triage vital signs and the nursing notes.  Pertinent labs & imaging results that were available during my care of the patient were reviewed by me and considered in my medical decision making (see chart for details).   63yF with dyspnea. Hx of combined systolic/diastolic HF. Increasing peripheral edema. Orthopnea. Exertional dyspnea. Hypoxic in her PCPs office. She says she was told she was in afib. She appears to be in sinus currently. She does have a hx of afib but prior Cox-Maze procedure and I don't think she has been anticoagulated since then? She is followed by Dr Harl Bowie. She reports that she  occasional has palpitations. She denies CP but trop is minimally elevated. Trend. With her degree of symptoms and hypoxemia will admit.   Final Clinical Impressions(s) / ED Diagnoses   Final diagnoses:  Acute on chronic combined systolic and diastolic heart failure [facility])    ED Discharge Orders    None       Virgel Manifold, MD 06/18/19 2350

## 2019-06-18 NOTE — H&P (Signed)
History and Physical    Toni [name] XNA:[id number] DOB: [birth year] DOA: 06/18/2019  PCP: Dara Lords, NP   Patient coming from: Home.  I have personally briefly reviewed patient's old medical records in [place]  Chief Complaint: Shortness of breath.  HPI: Toni [name] is a 63 y.o. female with medical history significant of chronic combined systolic and diastolic heart failure, mitral stenosis/mitral valve insufficiency with a history of annuloplasty, paroxysmal atrial fibrillation, history of Cox-Maze procedure, hypertension, DDD of the lumbar spine, anxiety, depression, insomnia, diverticulosis, GERD, gout, urolithiasis, morbid obesity, sleep apnea not on CPAP, former smoker that quit in 1997 who is coming into the emergency department due to several weeks of progressively worse dyspnea and lower extremity edema.  She doubled her dose of her diuretics yesterday and states that her weight decreased from 236 to 230 pounds.  She went to see her PCP today due to persistent dyspnea and had an O2 sat of 85% on room air.  She was subsequently advised to come to the emergency department.  She uses 2-3 pillows to sleep.  She denies chest pain, dizziness, diaphoresis, but complains of palpitations.  She denies fever, chills, sore throat, rhinorrhea, wheezing or hemoptysis.  Denies abdominal pain, nausea or emesis, diarrhea, melena or hematochezia.  She frequently gets constipation.  She complains of urgency when using diuretics, but denies dysuria or hematuria.  No polyuria, polydipsia, polyphagia or blurred vision.  ED Course: Initial vital signs temperature 98.3 F, pulse 66, respirations 17, blood pressure 139/78 mmHg and O2 sats 92% on room air.  She received 60 mEq of potassium p.o. and 60 mg of furosemide IM in the ED.  CBC shows a white count of 5.1, hemoglobin 10.4 g/dL and platelets 154.  Troponin #1 was 25 and number 226 ng/L.  BNP was 1113.0 pg/mL.  Potassium was 3.3 mmol/L.   The rest of the electrolytes are within normal limits.  Glucose 115, BUN 33, creatinine 1.22 mg/dL. EKG was sinus rhythm with all inferolateral infarct and prolonged QT interval.  Chest radiograph showed cardiomegaly and pulmonary vascular congestion.  Review of Systems: As per HPI otherwise 10 point review of systems negative.   Past Medical History:  Diagnosis Date   Congestive heart failure, unspecified    Diastolic heart failure   Degeneration of lumbar or lumbosacral intervertebral disc    Depression    Diverticulosis    Dyspnea     chronic multifactorial large component of deconditioning   Esophageal reflux    Gout    History of kidney stones    Insomnia    Working third shift   Mitral stenosis    Secondary to under-sized ring annuloplasty following mitral valve repair   Mitral valve disorders(424.0)    Mild to moderate mitral regurgitation   Mitral valve insufficiency and aortic valve insufficiency    Morbid obesity ([name])    Normocytic anemia    Other chest pain    Other dyspnea and respiratory abnormality    Paroxysmal atrial fibrillation ([name])    Stable no recurrence   Peripheral edema    chronic   S/P Maze operation for atrial fibrillation 02/27/2015   S/P mitral valve repair 02/27/2015   "radical repair" including resection of P2 segment of posterior leaflet with 26 mm Edwards Physio II annuloplasty ring   S/P tricuspid valve repair 02/27/2015   26 mm annuloplasty ring   Unspecified essential hypertension     Past Surgical History:  Procedure Laterality  Date   APPENDECTOMY     AXILLARY LYMPH NODE DISSECTION Left 01/04/2018   Procedure: LEFT AXILLARY LYMPH NODE DISSECTION;  Surgeon: Toni Cagey, MD;  Location: [facility];  Service: General;  Laterality: Left;   BREAST BIOPSY Left 2009   CARDIAC VALVE SURGERY  02/27/2015   Toni [name]. Mitral Valve Repair. Radical Repair with resection of P2 Prolapse, 26 mm Physio II Ring Annuloplasty,  Tricuspid Valve Repair (26 mm Ring Annuloplasty, Cox Maze IV Procedure on 02/27/2015   CHOLECYSTECTOMY     COLONOSCOPY  2011   Toni [name]: 60mm polyp from hepatic flexure (tubular adenoma)   COLONOSCOPY N/A 11/10/2016   Toni [name]: Diverticulosis. Next colonoscopy 7 years, January 2025   ESOPHAGOGASTRODUODENOSCOPY  2005    Toni [name] : normal esophagus s/p dilation. Small hiatal hernia noted. Felt improved after dilation.    ESOPHAGOGASTRODUODENOSCOPY N/A 11/10/2016   Toni [name]: Moderate-sized hiatal hernia, esophagus normal status post empiric dilation for history of dysphagia   HEEL SPUR SURGERY Right    MALONEY DILATION N/A 11/10/2016   Procedure: Venia Minks DILATION;  Surgeon: Daneil Dolin, MD;  Location: [facility];  Service: Endoscopy;  Laterality: N/A;   MINIMALLY INVASIVE TRICUSPID VALVE REPAIR  02/27/2015   [place]   MITRAL VALVE REPAIR  02/27/2015   [place]   PARTIAL MASTECTOMY WITH NEEDLE LOCALIZATION Left 01/04/2018   Procedure: LEFT PARTIAL MASTECTOMY STATUS POST NEEDLE LOCALIZATION;  Surgeon: Toni Cagey, MD;  Location: [facility];  Service: General;  Laterality: Left;   TEE WITHOUT CARDIOVERSION N/A 10/18/2016   Procedure: TRANSESOPHAGEAL ECHOCARDIOGRAM (TEE);  Surgeon: Toni Lenis, MD;  Location: [facility];  Service: Endoscopy;  Laterality: N/A;   TOTAL ABDOMINAL HYSTERECTOMY     TOTAL KNEE ARTHROPLASTY Right    TUBAL LIGATION       reports that she quit smoking about 23 years ago. Her smoking use included cigarettes. She has a 12.50 pack-year smoking history. She quit smokeless tobacco use about 45 years ago.  Her smokeless tobacco use included chew. She reports current alcohol use. She reports that she does not use drugs.  Allergies  Allergen Reactions   Iodinated Diagnostic Agents Swelling    EYES    Family History  Problem Relation Age of Onset   Hypertension Mother    Heart disease  Mother    Kidney disease Father    Stroke Father    Diabetes Sister    Kidney disease Sister    Heart disease Sister    Hypertension Sister    Heart disease Brother    Heart attack Son    Hypertension Son    Cancer Paternal Grandmother        Breast Cancer   Heart disease Brother    Colon cancer Neg Hx    Prior to Admission medications   Medication Sig Start Date End Date Taking? Authorizing Provider  acetaminophen (TYLENOL) 500 MG tablet Take 1,000 mg by mouth daily as needed for moderate pain or headache.   Yes [provider]  albuterol (PROVENTIL HFA;VENTOLIN HFA) 108 (90 Base) MCG/ACT inhaler Inhale 2 puffs into the lungs every 6 (six) hours as needed for wheezing or shortness of breath. 06/29/17  Yes Soyla Dryer, PA-C  amLODipine (NORVASC) 5 MG tablet Take 1 tablet (5 mg total) by mouth daily. 06/02/18  Yes Caren Macadam, MD  aspirin EC 81 MG tablet Take 1 tablet (81 mg total) by mouth daily. 11/15/17  Yes Strader,  [place] M, PA-C  baclofen (LIORESAL) 20 MG tablet Take 20 mg by mouth daily as needed for muscle spasms.  04/09/19  Yes [provider]  FLUoxetine (PROZAC) 40 MG capsule TAKE 1 CAPSULE BY MOUTH EVERY DAY Patient taking differently: Take 40 mg by mouth daily.  06/02/18  Yes Hagler, Apolonio Schneiders, MD  gabapentin (NEURONTIN) 300 MG capsule TAKE 1 CAPSULE BY MOUTH IN THE MORNING AND 2 CAPSULES IN THE EVENING Patient taking differently: Take 300-600 mg by mouth See admin instructions. 300mg  in the morning and 600mg  in the evening 06/21/18  Yes Hagler, Apolonio Schneiders, MD  guaiFENesin (MUCINEX) 600 MG 12 hr tablet Take 600 mg by mouth 2 (two) times daily as needed (congestion).   Yes [provider]  LINZESS 72 MCG capsule TAKE 1 CAPSULE (72 MCG TOTAL) BY MOUTH DAILY BEFORE BREAKFAST. Patient taking differently: Take 72 mcg by mouth daily before breakfast.  11/22/18  Yes Annitta Needs, NP  losartan (COZAAR) 100 MG tablet Take 1 tablet (100 mg total) by  mouth daily. 11/30/17  Yes Tanda Rockers, MD  magnesium gluconate (MAGONATE) 500 MG tablet Take 500 mg by mouth daily.   Yes [provider]  metoprolol succinate (TOPROL-XL) 100 MG 24 hr tablet Take 1 tablet (100 mg total) by mouth 2 (two) times daily. 11/13/18  Yes Branch, Alphonse Guild, MD  omeprazole (PRILOSEC) 40 MG capsule TAKE 1 CAPSULE BY MOUTH EVERY DAY Patient taking differently: Take 40 mg by mouth daily.  04/25/18  Yes Caren Macadam, MD  torsemide (DEMADEX) 20 MG tablet Take 20 mg by mouth 2 (two) times daily.    Yes [provider]  trolamine salicylate (ASPERCREME) 10 % cream Apply 1 application topically as needed for muscle pain.   Yes [provider]  allopurinol (ZYLOPRIM) 100 MG tablet Take 100 mg by mouth daily. 05/17/19   [provider]  potassium chloride SA (K-DUR) 20 MEQ tablet Take 2 tablets (40 mEq total) by mouth daily. For 3 days Patient not taking: Reported on 06/18/2019 05/01/19   Barrett, Evelene Croon, PA-C    Physical Exam: Vitals:   06/18/19 2050  Pulse: 66  Resp: 17  Temp: 98.3 F (36.8 C)  SpO2: 92%    Constitutional: NAD, calm, comfortable Eyes: PERRL, lids and conjunctivae normal ENMT: Mucous membranes are moist. Posterior pharynx clear of any exudate or lesions. Neck: normal, supple, no masses, no thyromegaly Respiratory: Bibasilar crackles.  No wheezing.  Normal respiratory effort. No accessory muscle use.  Cardiovascular: Regular rate and rhythm, no murmurs / rubs / gallops.  Trace lower extremity pitting edema. 2+ pedal pulses. No carotid bruits.  Abdomen: Obese, soft, no tenderness, no masses palpated. No hepatosplenomegaly. Bowel sounds positive.  Musculoskeletal: no clubbing / cyanosis. Good ROM, no contractures. Normal muscle tone.  Skin: no rashes, lesions, ulcers. No induration on very limited dermatological examination. Neurologic: CN 2-12 grossly intact. Sensation intact, DTR normal. Strength 5/5 in all 4.    Psychiatric: Normal judgment and insight. Alert and oriented x 3. Normal mood.   Labs on Admission: I have personally reviewed following labs and imaging studies  CBC: Recent Labs  Lab 06/18/19 2208  WBC 5.1  NEUTROABS 3.5  HGB 10.4*  HCT 33.7*  MCV 94.9  PLT 546   Basic Metabolic Panel: Recent Labs  Lab 06/18/19 2208  NA 141  K 3.3*  CL 108  CO2 23  GLUCOSE 115*  BUN 33*  CREATININE 1.22*  CALCIUM 9.1  MG 2.0  GFR: CrCl cannot be calculated (Unknown ideal weight.). Liver Function Tests: No results for input(s): AST, ALT, ALKPHOS, BILITOT, PROT, ALBUMIN in the last 168 hours. No results for input(s): LIPASE, AMYLASE in the last 168 hours. No results for input(s): AMMONIA in the last 168 hours. Coagulation Profile: No results for input(s): INR, PROTIME in the last 168 hours. Cardiac Enzymes: No results for input(s): CKTOTAL, CKMB, CKMBINDEX, TROPONINI in the last 168 hours. BNP (last 3 results) No results for input(s): PROBNP in the last 8760 hours. HbA1C: No results for input(s): HGBA1C in the last 72 hours. CBG: No results for input(s): GLUCAP in the last 168 hours. Lipid Profile: No results for input(s): CHOL, HDL, LDLCALC, TRIG, CHOLHDL, LDLDIRECT in the last 72 hours. Thyroid Function Tests: Recent Labs    06/18/19 2209  TSH 0.863   Anemia Panel: No results for input(s): VITAMINB12, FOLATE, FERRITIN, TIBC, IRON, RETICCTPCT in the last 72 hours. Urine analysis:    Component Value Date/Time   COLORURINE COLORLESS (A) 04/17/2017 1916   APPEARANCEUR TURBID (A) 04/17/2017 1916   LABSPEC 1.000 (L) 04/17/2017 1916   PHURINE 5.0 04/17/2017 1916   GLUCOSEU NEGATIVE 04/17/2017 1916   HGBUR NEGATIVE 04/17/2017 1916   BILIRUBINUR NEGATIVE 04/17/2017 1916   KETONESUR NEGATIVE 04/17/2017 1916   PROTEINUR NEGATIVE 04/17/2017 1916   NITRITE NEGATIVE 04/17/2017 1916   LEUKOCYTESUR NEGATIVE 04/17/2017 1916    Radiological Exams on Admission: Dg Chest  Portable 1 View  Result Date: 06/18/2019 CLINICAL DATA:  Dyspnea EXAM: PORTABLE CHEST 1 VIEW COMPARISON:  September 26, 2018 FINDINGS: There is cardiomegaly. Overlying median sternotomy wires and prosthetic mitral valve is seen. Aortic knob calcifications. There is prominent pulmonary vasculature. No acute osseous abnormality. IMPRESSION: Cardiomegaly and pulmonary vascular congestion. Electronically Signed   By: Prudencio Pair M.D.   On: 06/18/2019 22:40   Echo 11/07/2018 ------------------------------------------------------------------- LV EF: 40% -   45%  ------------------------------------------------------------------- Indications:      Shortness of breath 786.05.  ------------------------------------------------------------------- History:   PMH:  Acquired from the patient and from the patient&'s chart.  Atrial fibrillation.  PMH:  S/P tricuspid valve repair - 26 mm annuloplasty ring - Dr Donah Driver at [facility]. S/P Maze operation for atrial fibrillation. S/P mitral valve repair - &quot;radical repair&quot; including resection of P2 segment of posterior leaflet with 26 mm Edwards Physio II annuloplasty ring - Dr Donah Driver at [place] kidney disease, stage II (mild) DOE (dyspnea on exertion) Chronic diastolic heart failure  Risk factors:  Hypertension.  ------------------------------------------------------------------- Study Conclusions  - Left ventricle: LVEF is depressed at approximately 40 to 45% with   hypokinesis worse in the inferior, inferoseptal walls. The cavity   size was normal. Wall thickness was normal. Systolic function was   mildly to moderately reduced. The estimated ejection fraction was   in the range of 40% to 45%. - Mitral valve: MV is thickened with restricted motion. Peak and   mean gradients through the valve are 21 and 5 mm Hg MVA by P T1/2   is 1.3 cm2 consistent with severe mitral stenosis Compared to   previous echo in 2018, MVA  is smaller. - Left atrium: The atrium was severely dilated. - Right ventricle: Systolic function was mildly reduced. - Right atrium: The atrium was mildly dilated. - Pulmonary arteries: PA peak pressure: 46 mm Hg (S).  EKG: Independently reviewed.  Vent. rate 66 BPM PR interval * ms QRS duration 96 ms QT/QTc 521/546 ms P-R-T axes 77 61 135 Normal  sinus rhythm Inferolateral infarct, old Prolonged QT interval  Assessment/Plan Principal Problem:   Acute on chronic combined systolic and diastolic heart failure ([name]) Observation/telemetry. Continue supplemental oxygen. Fluid restriction. Advised to avoid sodium in her diet. Continue furosemide 40 mg IVP twice daily. Follow-up daily weights. Follow-up intake and output. Follow-up renal function and electrolytes.  Active Problems:   Prolonged QT interval Received magnesium sulfate. Avoid QT prolonging medications. Follow-up EKG in a.m.    HYPOKALEMIA Supplemented in the ED. Continue potassium supplementation. Monitor potassium level.    Normocytic anemia Anemia panel was normal in July last year. Monitor H&H.    Essential hypertension Continue amlodipine 5 mg p.o. daily. Continue losartan 100 mg p.o. daily. Resume metoprolol in a.m. Monitor BP, HR, renal function electrolytes.    Elevated troponin Minimal elevation secondary to demand ischemia.    Anxiety with depression Continue fluoxetine 40 mg p.o. daily.     DVT prophylaxis: Lovenox SQ. Code Status: Full code. Family Communication: Disposition Plan: Observation for induced diuresis with parenteral furosemide. Consults called: Admission status: Observation/telemetry.   Reubin Milan MD [facility]  If 7PM-7AM, please contact night-coverage [email or web address]  06/18/2019, 11:50 PM   This document was prepared using Dragon voice recognition software and may contain some unintended transcription errors.

## 2019-06-18 NOTE — ED Triage Notes (Signed)
Pt was seen at pcp today for dizziness x one month on her arrival her 02 on room air was 85% and they stated she was in a-fib. Pt sent here for evaluation

## 2019-06-19 ENCOUNTER — Observation Stay (HOSPITAL_BASED_OUTPATIENT_CLINIC_OR_DEPARTMENT_OTHER): Payer: PRIVATE HEALTH INSURANCE

## 2019-06-19 ENCOUNTER — Encounter (HOSPITAL_COMMUNITY): Payer: Self-pay

## 2019-06-19 DIAGNOSIS — I110 Hypertensive heart disease with heart failure: Secondary | ICD-10-CM | POA: Diagnosis not present

## 2019-06-19 DIAGNOSIS — I10 Essential (primary) hypertension: Secondary | ICD-10-CM | POA: Diagnosis not present

## 2019-06-19 DIAGNOSIS — E876 Hypokalemia: Secondary | ICD-10-CM | POA: Diagnosis not present

## 2019-06-19 DIAGNOSIS — R7989 Other specified abnormal findings of blood chemistry: Secondary | ICD-10-CM

## 2019-06-19 DIAGNOSIS — I5043 Acute on chronic combined systolic (congestive) and diastolic (congestive) heart failure: Secondary | ICD-10-CM | POA: Diagnosis not present

## 2019-06-19 DIAGNOSIS — I342 Nonrheumatic mitral (valve) stenosis: Secondary | ICD-10-CM

## 2019-06-19 DIAGNOSIS — Z952 Presence of prosthetic heart valve: Secondary | ICD-10-CM

## 2019-06-19 DIAGNOSIS — R9431 Abnormal electrocardiogram [ECG] [EKG]: Secondary | ICD-10-CM

## 2019-06-19 DIAGNOSIS — I050 Rheumatic mitral stenosis: Secondary | ICD-10-CM

## 2019-06-19 DIAGNOSIS — D500 Iron deficiency anemia secondary to blood loss (chronic): Secondary | ICD-10-CM | POA: Diagnosis not present

## 2019-06-19 LAB — BASIC METABOLIC PANEL
Anion gap: 12 (ref 5–15)
BUN: 33 mg/dL — ABNORMAL HIGH (ref 8–23)
CO2: 25 mmol/L (ref 22–32)
Calcium: 9.6 mg/dL (ref 8.9–10.3)
Chloride: 106 mmol/L (ref 98–111)
Creatinine, Ser: 1.2 mg/dL — ABNORMAL HIGH (ref 0.44–1.00)
GFR calc Af Amer: 55 mL/min — ABNORMAL LOW (ref >60)
GFR calc non Af Amer: 47 mL/min — ABNORMAL LOW (ref >60)
Glucose, Bld: 99 mg/dL (ref 70–99)
Potassium: 3.6 mmol/L (ref 3.5–5.1)
Sodium: 143 mmol/L (ref 135–145)

## 2019-06-19 LAB — ECHOCARDIOGRAM COMPLETE
Height: 67 in
Weight: 3693.15 oz

## 2019-06-19 LAB — TROPONIN I (HIGH SENSITIVITY): Troponin I (High Sensitivity): 26 ng/L — ABNORMAL HIGH (ref <18)

## 2019-06-19 MED ORDER — PROCHLORPERAZINE EDISYLATE 10 MG/2ML IJ SOLN: 5.0000 mg | INTRAMUSCULAR | Status: DC | PRN

## 2019-06-19 MED ORDER — GABAPENTIN 300 MG PO CAPS
300.0000 mg | ORAL_CAPSULE | Freq: Every morning | ORAL | Status: DC
  Administered 2019-06-19 – 2019-06-20 (×2): 300 mg via ORAL
  Filled 2019-06-19 (×2): qty 1

## 2019-06-19 MED ORDER — ENOXAPARIN SODIUM 60 MG/0.6ML ~~LOC~~ SOLN
0.5000 mg/kg | SUBCUTANEOUS | Status: DC
  Administered 2019-06-19 – 2019-06-20 (×2): 50 mg via SUBCUTANEOUS
  Filled 2019-06-19 (×2): qty 0.6

## 2019-06-19 MED ORDER — GABAPENTIN 300 MG PO CAPS: 300.0000 mg | ORAL_CAPSULE | ORAL | Status: DC

## 2019-06-19 MED ORDER — MAGNESIUM GLUCONATE 500 MG PO TABS
500.0000 mg | ORAL_TABLET | Freq: Every day | ORAL | Status: DC
  Filled 2019-06-19 (×2): qty 1

## 2019-06-19 MED ORDER — BACLOFEN 10 MG PO TABS: 20.0000 mg | ORAL_TABLET | Freq: Every day | ORAL | Status: DC | PRN

## 2019-06-19 MED ORDER — ALBUTEROL SULFATE (2.5 MG/3ML) 0.083% IN NEBU: 3.0000 mL | INHALATION_SOLUTION | Freq: Four times a day (QID) | RESPIRATORY_TRACT | Status: DC | PRN

## 2019-06-19 MED ORDER — ASPIRIN EC 81 MG PO TBEC
81.0000 mg | DELAYED_RELEASE_TABLET | Freq: Every day | ORAL | Status: DC
  Administered 2019-06-19 – 2019-06-20 (×2): 81 mg via ORAL
  Filled 2019-06-19 (×2): qty 1

## 2019-06-19 MED ORDER — GABAPENTIN 300 MG PO CAPS
600.0000 mg | ORAL_CAPSULE | Freq: Every evening | ORAL | Status: DC
  Administered 2019-06-19: 600 mg via ORAL
  Filled 2019-06-19: qty 2

## 2019-06-19 MED ORDER — LINACLOTIDE 72 MCG PO CAPS
72.0000 ug | ORAL_CAPSULE | Freq: Every day | ORAL | Status: DC
  Administered 2019-06-19 – 2019-06-20 (×2): 72 ug via ORAL
  Filled 2019-06-19 (×5): qty 1

## 2019-06-19 MED ORDER — GUAIFENESIN ER 600 MG PO TB12: 600.0000 mg | ORAL_TABLET | Freq: Two times a day (BID) | ORAL | Status: DC | PRN

## 2019-06-19 MED ORDER — LOSARTAN POTASSIUM 50 MG PO TABS
100.0000 mg | ORAL_TABLET | Freq: Every day | ORAL | Status: DC
  Administered 2019-06-19 – 2019-06-20 (×2): 100 mg via ORAL
  Filled 2019-06-19 (×2): qty 2

## 2019-06-19 MED ORDER — ACETAMINOPHEN 500 MG PO TABS
1000.0000 mg | ORAL_TABLET | Freq: Every day | ORAL | Status: DC | PRN
  Administered 2019-06-20: 1000 mg via ORAL
  Filled 2019-06-19: qty 2

## 2019-06-19 MED ORDER — ALLOPURINOL 100 MG PO TABS
100.0000 mg | ORAL_TABLET | Freq: Every day | ORAL | Status: DC
  Administered 2019-06-19 – 2019-06-20 (×2): 100 mg via ORAL
  Filled 2019-06-19 (×2): qty 1

## 2019-06-19 MED ORDER — PANTOPRAZOLE SODIUM 40 MG PO TBEC
40.0000 mg | DELAYED_RELEASE_TABLET | Freq: Every day | ORAL | Status: DC
  Administered 2019-06-19 – 2019-06-20 (×2): 40 mg via ORAL
  Filled 2019-06-19 (×2): qty 1

## 2019-06-19 MED ORDER — FUROSEMIDE 10 MG/ML IJ SOLN
60.0000 mg | Freq: Two times a day (BID) | INTRAMUSCULAR | Status: DC
  Administered 2019-06-19 – 2019-06-20 (×2): 60 mg via INTRAVENOUS
  Filled 2019-06-19 (×3): qty 6

## 2019-06-19 MED ORDER — MAGNESIUM OXIDE 400 (241.3 MG) MG PO TABS
400.0000 mg | ORAL_TABLET | Freq: Every day | ORAL | Status: DC
  Administered 2019-06-19 – 2019-06-20 (×2): 400 mg via ORAL
  Filled 2019-06-19 (×2): qty 1

## 2019-06-19 MED ORDER — AMLODIPINE BESYLATE 5 MG PO TABS
5.0000 mg | ORAL_TABLET | Freq: Every day | ORAL | Status: DC
  Administered 2019-06-19 – 2019-06-20 (×2): 5 mg via ORAL
  Filled 2019-06-19 (×2): qty 1

## 2019-06-19 MED ORDER — METOPROLOL SUCCINATE ER 50 MG PO TB24
100.0000 mg | ORAL_TABLET | Freq: Two times a day (BID) | ORAL | Status: DC
  Administered 2019-06-19 – 2019-06-20 (×3): 100 mg via ORAL
  Filled 2019-06-19 (×3): qty 2

## 2019-06-19 MED ORDER — FLUOXETINE HCL 20 MG PO CAPS
40.0000 mg | ORAL_CAPSULE | Freq: Every day | ORAL | Status: DC
  Administered 2019-06-19: 40 mg via ORAL
  Filled 2019-06-19: qty 2

## 2019-06-19 NOTE — Progress Notes (Addendum)
Patient Demographics:    Toni [name], is a 63 y.o. female, DOB - 1955-12-11, UKG:[id number]  Admit date - 06/18/2019   Admitting Physician Reubin Milan, MD  Outpatient Primary MD for the patient is Dekoninck, Dahlia Bailiff, NP  LOS - 0   Chief Complaint  Patient presents with  . Dizziness        Subjective:    Toni [name] today has no fevers, no emesis,  No chest pain, continues to have orthopnea, dyspnea, hypoxia and worsening lower extremity edema  Assessment  & Plan :    Principal Problem:   Acute on chronic combined systolic and diastolic heart failure ([name]) Active Problems:   HYPOKALEMIA   Normocytic anemia   Essential hypertension   Elevated troponin   Anxiety with depression   Prolonged QT interval   Brief Summary 63 y.o. female with past medical history of severe MR (s/p MV repair at [facility] in 02/2015 with radical Repair with resection of P2 Prolapse, 26 mm Physio II Ring Annuloplasty and Tricuspid Valve Repair with 26 mm Ring Annuloplasty and Cox Maze IV Procedure, severe mitral stenosis by echo in 10/2018), mild nonobstructive CAD by cath in 02/2015, chronic combined systolic and diastolic CHF (EF 76-28% by echo in 10/2018), atrial fibrillation (no known recurrence since MAZE procedure in 2016, no longer on anticoagulation), HTN, and HLD  admitted on 06/18/2019 with acute on chronic CHF exacerbation in the setting of noncompliance with salt and fluid restriction and incomplete compliance with diuretics   A/p 1)HFrEF--- history of combined systolic and diastolic dysfunction CHF -patient now with acute on chronic CHF exacerbation, EF per echo this admission 50% (up from 40 to 45% about 7 months ago) -Patient admits to noncompliance with salt and fluid restriction and incomplete compliance with diuretics -IV Lasix 60 mg every 12 hours, daily weights and fluid input and output monitoring  -Continue losartan 100 mg daily -Continue metoprolol XL 100 mg twice daily  2)Valvular Heart Disease--- severe mitral stenosis--- s/p MV repair at [facility] in 02/2015 with radical Repair with resection of P2 Prolapse, 26 mm Physio II Ring Annuloplasty and Tricuspid Valve Repair with 26 mm Ring Annuloplasty and Cox Maze IV Procedure.  Echo this admission again shows severe mitral stenosis as outlined above.  - was evaluated by Dr. Roxy Manns with CT Surgery in 12/2016 and redo MVR was not recommended at that time given the significant risks. Continued medical management was recommended.   3)H/o Afib- s/p MAZE procedure at the time of MVR in 02/2015, appears to be in sinus, no Full anticoagulation -Continue Toprol-XL and aspirin -Patient with recent history of PVCs/NSVT -Prolonged QT noted , fluoxetine will be discontinued  4) hypoxic respiratory failure--- secondary to underlying COPD and CHF exacerbation, continue supplemental oxygen, continue IV Lasix for diuresis for CHF exacerbation -Patient is a reformed smoker with some component of COPD  5) anxiety and depression--- stable, fluoxetine discontinued due to prolonged QT  6) morbid obesity/OSA-patient is not on CPAP at home  Disposition/Need for in-Hospital Stay- patient unable to be discharged at this time due to requiring IV diuresis and monitoring of electrolytes and hemodynamics while aggressively diuresing her due to-significant dyspnea, hypoxia  Code Status : full  Family Communication:   NA (patient  is alert, awake and coherent)   Disposition Plan  : Home when medically improved  Consults  :  cardiology  DVT Prophylaxis  :  Lovenox -   SCDs    Lab Results  Component Value Date   PLT 154 06/18/2019    Inpatient Medications  Scheduled Meds: . allopurinol  100 mg Oral Daily  . amLODipine  5 mg Oral Daily  . aspirin EC  81 mg Oral Daily  . enoxaparin (LOVENOX) injection  0.5 mg/kg Subcutaneous Q24H  . furosemide  60 mg  Intravenous BID  . gabapentin  300 mg Oral q morning - 10a  . gabapentin  600 mg Oral QPM  . linaclotide  72 mcg Oral QAC breakfast  . losartan  100 mg Oral Daily  . magnesium oxide  400 mg Oral Daily  . metoprolol succinate  100 mg Oral BID  . pantoprazole  40 mg Oral Daily  . potassium chloride  20 mEq Oral BID   Continuous Infusions: PRN Meds:.acetaminophen, albuterol, baclofen, guaiFENesin, prochlorperazine    Anti-infectives (From admission, onward)   None        Objective:   Vitals:   06/19/19 0459 06/19/19 0527 06/19/19 1307 06/19/19 1430  BP:  136/64 125/70 125/72  Pulse:  (!) 58 (!) 57 61  Resp:  16  16  Temp:  98.1 F (36.7 C)  98.1 F (36.7 C)  TempSrc:  Oral  Oral  SpO2:  94%  95%  Weight: 104.7 kg     Height:        Wt Readings from Last 3 Encounters:  06/19/19 104.7 kg  05/01/19 107 kg  01/05/19 103.7 kg     Intake/Output Summary (Last 24 hours) at 06/19/2019 1535 Last data filed at 06/19/2019 1300 Gross per 24 hour  Intake 615.68 ml  Output -  Net 615.68 ml     Physical Exam  Gen:- Awake Alert, no conversational dyspnea at rest patient does have dyspnea on exertion HEENT:- [place].AT, No sclera icterus Nose- [place] 2 L/min Neck-Supple Neck,No JVD,.  Lungs-diminished in bases, no wheezing  CV- S1, S2 normal, regular , 2/6 SM Abd-  +ve B.Sounds, Abd Soft, No tenderness,    Extremity/Skin:-1+ pitting edema, pedal pulses present  Psych-affect is appropriate, oriented x3 Neuro-no new focal deficits, no tremors   Data Review:   Micro Results Recent Results (from the past 240 hour(s))  SARS Coronavirus 2 [facility] order, Performed in [facility] hospital lab) Nasopharyngeal Nasopharyngeal Swab     Status: None   Collection Time: 06/18/19 10:08 PM   Specimen: Nasopharyngeal Swab  Result Value Ref Range Status   SARS Coronavirus 2 NEGATIVE NEGATIVE Final    Comment: (NOTE) If result is NEGATIVE SARS-CoV-2 target nucleic acids are NOT DETECTED.  The SARS-CoV-2 RNA is generally detectable in upper and lower  respiratory specimens during the acute phase of infection. The lowest  concentration of SARS-CoV-2 viral copies this assay can detect is 250  copies / mL. A negative result does not preclude SARS-CoV-2 infection  and should not be used as the sole basis for treatment or other  patient management decisions.  A negative result may occur with  improper specimen collection / handling, submission of specimen other  than nasopharyngeal swab, presence of viral mutation(s) within the  areas targeted by this assay, and inadequate number of viral copies  (<250 copies / mL). A negative result must be combined with clinical  observations, patient history, and epidemiological information. If result  is POSITIVE SARS-CoV-2 target nucleic acids are DETECTED. The SARS-CoV-2 RNA is generally detectable in upper and lower  respiratory specimens dur ing the acute phase of infection.  Positive  results are indicative of active infection with SARS-CoV-2.  Clinical  correlation with patient history and other diagnostic information is  necessary to determine patient infection status.  Positive results do  not rule out bacterial infection or co-infection with other viruses. If result is PRESUMPTIVE POSTIVE SARS-CoV-2 nucleic acids MAY BE PRESENT.   A presumptive positive result was obtained on the submitted specimen  and confirmed on repeat testing.  While 2019 novel coronavirus  (SARS-CoV-2) nucleic acids may be present in the submitted sample  additional confirmatory testing may be necessary for epidemiological  and / or clinical management purposes  to differentiate between  SARS-CoV-2 and other Sarbecovirus currently known to infect humans.  If clinically indicated additional testing with an alternate test  methodology [phone]) is advised. The SARS-CoV-2 RNA is generally  detectable in upper and lower respiratory sp ecimens during the acute   phase of infection. The expected result is Negative. Fact Sheet for Patients:  StrictlyIdeas.no Fact Sheet for Healthcare Providers: BankingDealers.co.za This test is not yet approved or cleared by the [place] FDA and has been authorized for detection and/or diagnosis of SARS-CoV-2 by FDA under an Emergency Use Authorization (EUA).  This EUA will remain in effect (meaning this test can be used) for the duration of the COVID-19 declaration under Section 564(b)(1) of the Act, 21 U.S.C. section 360bbb-3(b)(1), unless the authorization is terminated or revoked sooner. Performed at [facility], [address], [place], [place] [address]    Radiology Reports Dg Chest Portable 1 View  Result Date: 06/18/2019 CLINICAL DATA:  Dyspnea EXAM: PORTABLE CHEST 1 VIEW COMPARISON:  September 26, 2018 FINDINGS: There is cardiomegaly. Overlying median sternotomy wires and prosthetic mitral valve is seen. Aortic knob calcifications. There is prominent pulmonary vasculature. No acute osseous abnormality. IMPRESSION: Cardiomegaly and pulmonary vascular congestion. Electronically Signed   By: Prudencio Pair M.D.   On: 06/18/2019 22:40    CBC Recent Labs  Lab 06/18/19 2208  WBC 5.1  HGB 10.4*  HCT 33.7*  PLT 154  MCV 94.9  MCH 29.3  MCHC 30.9  RDW 15.4  LYMPHSABS 1.2  MONOABS 0.4  EOSABS 0.1  BASOSABS 0.0    Chemistries  Recent Labs  Lab 06/18/19 2208 06/19/19 0556  NA 141 143  K 3.3* 3.6  CL 108 106  CO2 23 25  GLUCOSE 115* 99  BUN 33* 33*  CREATININE 1.22* 1.20*  CALCIUM 9.1 9.6  MG 2.0  --    ------------------------------------------------------------------------------------------------------------------ No results for input(s): CHOL, HDL, LDLCALC, TRIG, CHOLHDL, LDLDIRECT in the last 72 hours.  Lab Results  Component Value Date   HGBA1C 5.4 09/16/2017    ------------------------------------------------------------------------------------------------------------------ Recent Labs    06/18/19 2209  TSH 0.863   ------------------------------------------------------------------------------------------------------------------ No results for input(s): VITAMINB12, FOLATE, FERRITIN, TIBC, IRON, RETICCTPCT in the last 72 hours.  Coagulation profile No results for input(s): INR, PROTIME in the last 168 hours.  No results for input(s): DDIMER in the last 72 hours.  Cardiac Enzymes No results for input(s): CKMB, TROPONINI, MYOGLOBIN in the last 168 hours.  Invalid input(s): CK ------------------------------------------------------------------------------------------------------------------    Component Value Date/Time   BNP 1,113.0 (H) 06/18/2019 2208    Roxan Hockey M.D on 06/19/2019 at 3:35 PM  Go to [email or web address] - for contact info  [facility] - Office  [phone]

## 2019-06-19 NOTE — Progress Notes (Signed)
SATURATION QUALIFICATIONS: (This note is used to comply with regulatory documentation for home oxygen)  Patient Saturations on Room Air at Rest = 96%  Patient Saturations on Room Air while Ambulating = 89-90%  Please briefly explain why patient needs home oxygen: Patient ambulated approximately 200 ft, slight dyspnea noted that resolved with rest. SaO2 on RA after rest up to 93%.

## 2019-06-19 NOTE — Plan of Care (Signed)

## 2019-06-19 NOTE — Progress Notes (Signed)
Patient states she does not wear CPAP at night. RT educated patient and she states she will have RN contact RT if she changes her mind.

## 2019-06-19 NOTE — ED Notes (Signed)
ED TO INPATIENT HANDOFF REPORT  ED Nurse Name and Phone #: [phone]  S Name/Age/Gender Toni [name] 63 y.o. female Room/Bed: APA06/APA06  Code Status   Code Status: Full Code  Home/SNF/Other Home Patient oriented to: situation Is this baseline? Yes   Triage Complete: Triage complete  Chief Complaint Low oxygen   Triage Note Pt was seen at pcp today for dizziness x one month on her arrival her 02 on room air was 85% and they stated she was in a-fib. Pt sent here for evaluation   Allergies Allergies  Allergen Reactions  . Iodinated Diagnostic Agents Swelling    EYES    Level of Care/Admitting Diagnosis ED Disposition    ED Disposition Condition Comment   Admit  Hospital Area: [facility] [[id number]]  Level of Care: Telemetry [5]  Covid Evaluation: Confirmed COVID Negative  Diagnosis: Acute on chronic combined systolic and diastolic heart failure ([place]) [428.43.ICD-9-CM]  Admitting Physician: Reubin Milan [[id number]]  Attending Physician: Reubin Milan [[id number]]  PT Class (Do Not Modify): Observation [104]  PT Acc Code (Do Not Modify): Observation [10022]       B Medical/Surgery History Past Medical History:  Diagnosis Date  . Congestive heart failure, unspecified    Diastolic heart failure  . Degeneration of lumbar or lumbosacral intervertebral disc   . Depression   . Diverticulosis   . Dyspnea     chronic multifactorial large component of deconditioning  . Esophageal reflux   . Gout   . History of kidney stones   . Insomnia    Working third shift  . Mitral stenosis    Secondary to under-sized ring annuloplasty following mitral valve repair  . Mitral valve disorders(424.0)    Mild to moderate mitral regurgitation  . Mitral valve insufficiency and aortic valve insufficiency   . Morbid obesity ([place])   . Normocytic anemia   . Other chest pain   . Other dyspnea and respiratory abnormality   . Paroxysmal atrial fibrillation ([name])     Stable no recurrence  . Peripheral edema    chronic  . S/P Maze operation for atrial fibrillation 02/27/2015  . S/P mitral valve repair 02/27/2015   "radical repair" including resection of P2 segment of posterior leaflet with 26 mm Edwards Physio II annuloplasty ring  . S/P tricuspid valve repair 02/27/2015   26 mm annuloplasty ring  . Unspecified essential hypertension    Past Surgical History:  Procedure Laterality Date  . APPENDECTOMY    . AXILLARY LYMPH NODE DISSECTION Left 01/04/2018   Procedure: LEFT AXILLARY LYMPH NODE DISSECTION;  Surgeon: [name], MD;  Location: [facility];  Service: General;  Laterality: Left;  . BREAST BIOPSY Left 2009  . CARDIAC VALVE SURGERY  02/27/2015   Mikel Cella. Mitral Valve Repair. Radical Repair with resection of P2 Prolapse, 26 mm Physio II Ring Annuloplasty, Tricuspid Valve Repair (26 mm Ring Annuloplasty, Cox Maze IV Procedure on 02/27/2015  . CHOLECYSTECTOMY    . COLONOSCOPY  2011   Dr. Laural Golden: 75mm polyp from hepatic flexure (tubular adenoma)  . COLONOSCOPY N/A 11/10/2016   Dr. Gala Romney: Diverticulosis. Next colonoscopy 7 years, January 2025  . ESOPHAGOGASTRODUODENOSCOPY  2005    Dr. Gala Romney : normal esophagus s/p dilation. Small hiatal hernia noted. Felt improved after dilation.   . ESOPHAGOGASTRODUODENOSCOPY N/A 11/10/2016   Dr. Gala Romney: Moderate-sized hiatal hernia, esophagus normal status post empiric dilation for history of dysphagia  . HEEL SPUR SURGERY Right   . MALONEY DILATION N/A  11/10/2016   Procedure: Venia Minks DILATION;  Surgeon: Daneil Dolin, MD;  Location: [facility] END[facility] SUITE;  Service: Endoscopy;  Laterality: N/A;  . MINIMALLY INVASIVE TRICUSPID VALVE REPAIR  02/27/2015   [place]  . MITRAL VALVE REPAIR  02/27/2015   [place]  . PARTIAL MASTECTOMY WITH NEEDLE LOCALIZATION Left 01/04/2018   Procedure: LEFT PARTIAL MASTECTOMY STATUS POST NEEDLE LOCALIZATION;  Surgeon: [name], MD;   Location: [facility];  Service: General;  Laterality: Left;  . TEE WITHOUT CARDIOVERSION N/A 10/18/2016   Procedure: TRANSESOPHAGEAL ECHOCARDIOGRAM (TEE);  Surgeon: Arnoldo Lenis, MD;  Location: [facility];  Service: Endoscopy;  Laterality: N/A;  . TOTAL ABDOMINAL HYSTERECTOMY    . TOTAL KNEE ARTHROPLASTY Right   . TUBAL LIGATION       A IV Location/Drains/Wounds Patient Lines/Drains/Airways Status   Active Line/Drains/Airways    Name:   Placement date:   Placement time:   Site:   Days:   Peripheral IV 06/18/19 Right Antecubital   06/18/19    2244    Antecubital   1   Incision (Closed) 01/04/18 Breast Left   01/04/18    1029     531   Incision (Closed) 01/04/18 Axilla Left   01/04/18    1329     531          Intake/Output Last 24 hours No intake or output data in the 24 hours ending 06/19/19 0004  Labs/Imaging Results for orders placed or performed during the hospital encounter of 06/18/19 (from the past 48 hour(s))  Troponin I (High Sensitivity)     Status: Abnormal   Collection Time: 06/18/19 10:08 PM  Result Value Ref Range   Troponin I (High Sensitivity) 25 (H) <18 ng/L    Comment: (NOTE) Elevated high sensitivity troponin I (hsTnI) values and significant  changes across serial measurements may suggest ACS but many other  chronic and acute conditions are known to elevate hsTnI results.  Refer to the "Links" section for chest pain algorithms and additional  guidance. Performed at [facility], [address] [place], [place] [address]   CBC with Differential     Status: Abnormal   Collection Time: 06/18/19 10:08 PM  Result Value Ref Range   WBC 5.1 4.0 - 10.5 K/uL   RBC 3.55 (L) 3.87 - 5.11 MIL/uL   Hemoglobin 10.4 (L) 12.0 - 15.0 g/dL   HCT 33.7 (L) 36.0 - 46.0 %   MCV 94.9 80.0 - 100.0 fL   MCH 29.3 26.0 - 34.0 pg   MCHC 30.9 30.0 - 36.0 g/dL   RDW 15.4 11.5 - 15.5 %   Platelets 154 150 - 400 K/uL   nRBC 0.0 0.0 - 0.2 %   Neutrophils Relative % 68 %    Neutro Abs 3.5 1.7 - 7.7 K/uL   Lymphocytes Relative 23 %   Lymphs Abs 1.2 0.7 - 4.0 K/uL   Monocytes Relative 7 %   Monocytes Absolute 0.4 0.1 - 1.0 K/uL   Eosinophils Relative 2 %   Eosinophils Absolute 0.1 0.0 - 0.5 K/uL   Basophils Relative 0 %   Basophils Absolute 0.0 0.0 - 0.1 K/uL   Immature Granulocytes 0 %   Abs Immature Granulocytes 0.01 0.00 - 0.07 K/uL    Comment: Performed at [facility], [address], [place], [place] [address]  Brain natriuretic peptide     Status: Abnormal   Collection Time: 06/18/19 10:08 PM  Result Value  Ref Range   B Natriuretic Peptide 1,113.0 (H) 0.0 - 100.0 pg/mL    Comment: Performed at [facility], [address], [place], [place] [address]  SARS Coronavirus 2 [facility] order, Performed in [facility] hospital lab) Nasopharyngeal Nasopharyngeal Swab     Status: None   Collection Time: 06/18/19 10:08 PM   Specimen: Nasopharyngeal Swab  Result Value Ref Range   SARS Coronavirus 2 NEGATIVE NEGATIVE    Comment: (NOTE) If result is NEGATIVE SARS-CoV-2 target nucleic acids are NOT DETECTED. The SARS-CoV-2 RNA is generally detectable in upper and lower  respiratory specimens during the acute phase of infection. The lowest  concentration of SARS-CoV-2 viral copies this assay can detect is 250  copies / mL. A negative result does not preclude SARS-CoV-2 infection  and should not be used as the sole basis for treatment or other  patient management decisions.  A negative result may occur with  improper specimen collection / handling, submission of specimen other  than nasopharyngeal swab, presence of viral mutation(s) within the  areas targeted by this assay, and inadequate number of viral copies  (<250 copies / mL). A negative result must be combined with clinical  observations, patient history, and epidemiological information. If result is POSITIVE SARS-CoV-2 target nucleic acids are DETECTED. The SARS-CoV-2 RNA is generally detectable in upper  and lower  respiratory specimens dur ing the acute phase of infection.  Positive  results are indicative of active infection with SARS-CoV-2.  Clinical  correlation with patient history and other diagnostic information is  necessary to determine patient infection status.  Positive results do  not rule out bacterial infection or co-infection with other viruses. If result is PRESUMPTIVE POSTIVE SARS-CoV-2 nucleic acids MAY BE PRESENT.   A presumptive positive result was obtained on the submitted specimen  and confirmed on repeat testing.  While 2019 novel coronavirus  (SARS-CoV-2) nucleic acids may be present in the submitted sample  additional confirmatory testing may be necessary for epidemiological  and / or clinical management purposes  to differentiate between  SARS-CoV-2 and other Sarbecovirus currently known to infect humans.  If clinically indicated additional testing with an alternate test  methodology [phone]) is advised. The SARS-CoV-2 RNA is generally  detectable in upper and lower respiratory sp ecimens during the acute  phase of infection. The expected result is Negative. Fact Sheet for Patients:  StrictlyIdeas.no Fact Sheet for Healthcare Providers: BankingDealers.co.za This test is not yet approved or cleared by the [place] FDA and has been authorized for detection and/or diagnosis of SARS-CoV-2 by FDA under an Emergency Use Authorization (EUA).  This EUA will remain in effect (meaning this test can be used) for the duration of the COVID-19 declaration under Section 564(b)(1) of the Act, 21 U.S.C. section 360bbb-3(b)(1), unless the authorization is terminated or revoked sooner. Performed at [facility], [address] [place], [place] [address]   Magnesium     Status: None   Collection Time: 06/18/19 10:08 PM  Result Value Ref Range   Magnesium 2.0 1.7 - 2.4 mg/dL    Comment: Performed at [facility], [address], [place], [place] [address]  Basic metabolic panel     Status: Abnormal   Collection Time: 06/18/19 10:08 PM  Result Value Ref Range   Sodium 141 135 - 145 mmol/L   Potassium 3.3 (L) 3.5 - 5.1 mmol/L   Chloride 108 98 - 111 mmol/L   CO2 23 22 - 32 mmol/L   Glucose, Bld 115 (  H) 70 - 99 mg/dL   BUN 33 (H) 8 - 23 mg/dL   Creatinine, Ser 1.22 (H) 0.44 - 1.00 mg/dL   Calcium 9.1 8.9 - 10.3 mg/dL   GFR calc non Af Amer 46 (L) >60 mL/min   GFR calc Af Amer 54 (L) >60 mL/min   Anion gap 10 5 - 15    Comment: Performed at [facility], [address], [place], [place] [address]  TSH     Status: None   Collection Time: 06/18/19 10:09 PM  Result Value Ref Range   TSH 0.863 0.350 - 4.500 uIU/mL    Comment: Performed by a 3rd Generation assay with a functional sensitivity of <=0.01 uIU/mL. Performed at [facility], [address] [place], [place] [address]    Dg Chest Portable 1 View  Result Date: 06/18/2019 CLINICAL DATA:  Dyspnea EXAM: PORTABLE CHEST 1 VIEW COMPARISON:  September 26, 2018 FINDINGS: There is cardiomegaly. Overlying median sternotomy wires and prosthetic mitral valve is seen. Aortic knob calcifications. There is prominent pulmonary vasculature. No acute osseous abnormality. IMPRESSION: Cardiomegaly and pulmonary vascular congestion. Electronically Signed   By: Prudencio Pair M.D.   On: 06/18/2019 22:40    Pending Labs Unresulted Labs (From admission, onward)    Start     Ordered   06/25/19 0500  Creatinine, serum  (enoxaparin (LOVENOX)    CrCl >/= 30 ml/min)  Weekly,   R    Comments: while on enoxaparin therapy    06/19/19 0000   06/19/19 3419  Basic metabolic panel  Daily,   R     06/18/19 2356   06/19/19 0500  HIV antibody (Routine Testing)  Tomorrow morning,   R     06/19/19 0000          Vitals/Pain Today's Vitals   06/18/19 2050 06/18/19 2051 06/18/19 2330 06/19/19 0002  BP:   139/78 131/84  Pulse: 66  60 73  Resp: 17  18 (!) 22  Temp: 98.3 F (36.8 C)      SpO2: 92%  92% 92%  PainSc:  0-No pain      Isolation Precautions No active isolations  Medications Medications  magnesium sulfate IVPB 2 g 50 mL (2 g Intravenous New Bag/Given 06/19/19 0003)  furosemide (LASIX) injection 40 mg (has no administration in time range)  potassium chloride SA (K-DUR) CR tablet 20 mEq (has no administration in time range)  enoxaparin (LOVENOX) injection 40 mg (has no administration in time range)  prochlorperazine (COMPAZINE) injection 5 mg (has no administration in time range)  furosemide (LASIX) injection 80 mg (80 mg Intramuscular Given 06/18/19 2314)  potassium chloride SA (K-DUR) CR tablet 60 mEq (60 mEq Oral Given 06/18/19 2345)    Mobility walks Low fall risk   Focused Assessments    R Recommendations: See Admitting Provider Note  Report given to:   Additional Notes:Pt on 2lpm via nasal cannula.

## 2019-06-19 NOTE — Progress Notes (Signed)
    Repeat EKG shows prolonged QT at 574 ms by computer-generated read, similar results of 560 ms when personally calculated. Of note, she does have TWI along V1-V3 which is new when compared to her tracing from yesterday but similar to tracings in 01/2018. Initial and delta HS Troponin flat at 25 and 26 this admission. Will repeat with AM labs. Continue to monitor clinically. Would recommend holding QT-prolonging medications at this time. Will review with admitting team.    Signed, Erma Heritage, PA-C 06/19/2019, 12:37 PM Pager: [phone]

## 2019-06-19 NOTE — Consult Note (Addendum)
Cardiology Consult    Patient ID: Toni [name]; [id number]; September 09, 1956   Admit date: 06/18/2019 Date of Consult: 06/19/2019  Primary Care Provider: Dara Lords, NP Primary Cardiologist: Carlyle Dolly, MD   Patient Profile    Toni [name] is a 63 y.o. female with past medical history of severe MR (s/p MV repair at [facility] in 02/2015 with radical Repair with resection of P2 Prolapse, 26 mm Physio II Ring Annuloplasty and Tricuspid Valve Repair with 26 mm Ring Annuloplasty and Cox Maze IV Procedure, severe mitral stenosis by echo in 10/2018), mild nonobstructive CAD by cath in 02/2015, chronic combined systolic and diastolic CHF (EF 93-26% by echo in 10/2018), atrial fibrillation (no known recurrence since MAZE procedure in 2016, no longer on anticoagulation), HTN, and HLD who is being seen today for the evaluation of CHF at the request of Dr. Denton Brick.   History of Present Illness    Toni [name] was last examined by Toni Ferries, PA-C in 04/2019 and reported worsening dyspnea on exertion and orthopnea. Weight was 235 lbs (previously 228 lbs in 12/2018) and she was informed to take Torsemide 40mg  BID for 3 days then reduce back to her prior dosing of 40mg  daily alternating with 60mg  daily.   She presented to the ED on 06/18/2019 after being evaluated by her PCP and informed she was back in atrial fibrillation. She reported worsening edema and dyspnea for the past few weeks. Oxygen saturations were at 85% and ED evaluation was recommended.   She reports having progressive dyspnea on exertion and orthopnea for the past 3-4 weeks. Gets short of breath when walking to her mailbox. Denies any associated chest pain. She does experience occasional palpitations which can last for seconds at a time. She has also noted lower extremity edema and a dry cough. She weighs herself daily and says this increased to 230 lbs on her home scales this past weekend but decreased by 6 lbs in one day. Weight is  typically in the mid-220's per her report. She tries to take Torsemide 40mg  daily but says she rarely takes the 60mg  daily as this causes too frequent of urination and makes her feel weak. She does not follow a low-sodium diet and frequently consumes potato chips. Also consumes fast food a few times per week.    Initial labs show WBC 5.1, Hgb 10.4, platelets 154, Na+ 141, K+ 3.3, and creatine 1.22 (close to baseline). TSH 0.863. Mg 2.0. COVID negative. Initial and delta HS Troponin 25 and 26. BNP 1113. CXR showed cardiomegaly and pulmonary vascular congestion. EKG shows NSR, HR 66, inferior Q waves, and computer generated QTc 546 ms with similar results when manually calculated.   She received IV Lasix 80mg  BID in the ED and was scheduled to start IV Lasix 40mg  BID this AM. Weight recorded as 230 lbs this AM.   Past Medical History:  Diagnosis Date   Congestive heart failure, unspecified    Diastolic heart failure   Degeneration of lumbar or lumbosacral intervertebral disc    Depression    Diverticulosis    Dyspnea     chronic multifactorial large component of deconditioning   Esophageal reflux    Gout    History of kidney stones    Insomnia    Working third shift   Mitral stenosis    Secondary to under-sized ring annuloplasty following mitral valve repair   Mitral valve disorders(424.0)    Mild to moderate mitral regurgitation   Mitral valve  insufficiency and aortic valve insufficiency    Morbid obesity ([name])    Normocytic anemia    Other chest pain    Other dyspnea and respiratory abnormality    Paroxysmal atrial fibrillation ([name])    Stable no recurrence   Peripheral edema    chronic   S/P Maze operation for atrial fibrillation 02/27/2015   S/P mitral valve repair 02/27/2015   "radical repair" including resection of P2 segment of posterior leaflet with 26 mm Edwards Physio II annuloplasty ring   S/P tricuspid valve repair 02/27/2015   26 mm annuloplasty ring    Unspecified essential hypertension     Past Surgical History:  Procedure Laterality Date   APPENDECTOMY     AXILLARY LYMPH NODE DISSECTION Left 01/04/2018   Procedure: LEFT AXILLARY LYMPH NODE DISSECTION;  Surgeon: [name], MD;  Location: [facility];  Service: General;  Laterality: Left;   BREAST BIOPSY Left 2009   CARDIAC VALVE SURGERY  02/27/2015   Mikel Cella. Mitral Valve Repair. Radical Repair with resection of P2 Prolapse, 26 mm Physio II Ring Annuloplasty, Tricuspid Valve Repair (26 mm Ring Annuloplasty, Cox Maze IV Procedure on 02/27/2015   CHOLECYSTECTOMY     COLONOSCOPY  2011   Dr. Laural Golden: 48mm polyp from hepatic flexure (tubular adenoma)   COLONOSCOPY N/A 11/10/2016   Dr. Gala Romney: Diverticulosis. Next colonoscopy 7 years, January 2025   ESOPHAGOGASTRODUODENOSCOPY  2005    Dr. Gala Romney : normal esophagus s/p dilation. Small hiatal hernia noted. Felt improved after dilation.    ESOPHAGOGASTRODUODENOSCOPY N/A 11/10/2016   Dr. Gala Romney: Moderate-sized hiatal hernia, esophagus normal status post empiric dilation for history of dysphagia   HEEL SPUR SURGERY Right    MALONEY DILATION N/A 11/10/2016   Procedure: Venia Minks DILATION;  Surgeon: Daneil Dolin, MD;  Location: [facility];  Service: Endoscopy;  Laterality: N/A;   MINIMALLY INVASIVE TRICUSPID VALVE REPAIR  02/27/2015   [place]   MITRAL VALVE REPAIR  02/27/2015   [place]   PARTIAL MASTECTOMY WITH NEEDLE LOCALIZATION Left 01/04/2018   Procedure: LEFT PARTIAL MASTECTOMY STATUS POST NEEDLE LOCALIZATION;  Surgeon: [name], MD;  Location: [facility];  Service: General;  Laterality: Left;   TEE WITHOUT CARDIOVERSION N/A 10/18/2016   Procedure: TRANSESOPHAGEAL ECHOCARDIOGRAM (TEE);  Surgeon: Arnoldo Lenis, MD;  Location: [facility];  Service: Endoscopy;  Laterality: N/A;   TOTAL ABDOMINAL HYSTERECTOMY     TOTAL KNEE ARTHROPLASTY Right    TUBAL LIGATION        Home Medications:  Prior to Admission medications   Medication Sig Start Date End Date Taking? Authorizing Provider  acetaminophen (TYLENOL) 500 MG tablet Take 1,000 mg by mouth daily as needed for moderate pain or headache.   Yes [provider]  albuterol (PROVENTIL HFA;VENTOLIN HFA) 108 (90 Base) MCG/ACT inhaler Inhale 2 puffs into the lungs every 6 (six) hours as needed for wheezing or shortness of breath. 06/29/17  Yes Soyla Dryer, PA-C  amLODipine (NORVASC) 5 MG tablet Take 1 tablet (5 mg total) by mouth daily. 06/02/18  Yes Caren Macadam, MD  aspirin EC 81 MG tablet Take 1 tablet (81 mg total) by mouth daily. 11/15/17  Yes Strader, [place] M, PA-C  baclofen (LIORESAL) 20 MG tablet Take 20 mg by mouth daily as needed for muscle spasms.  04/09/19  Yes [provider]  FLUoxetine (PROZAC) 40 MG capsule TAKE 1 CAPSULE BY MOUTH EVERY DAY Patient taking differently: Take 40 mg by mouth  daily.  06/02/18  Yes Hagler, Apolonio Schneiders, MD  gabapentin (NEURONTIN) 300 MG capsule TAKE 1 CAPSULE BY MOUTH IN THE MORNING AND 2 CAPSULES IN THE EVENING Patient taking differently: Take 300-600 mg by mouth See admin instructions. 300mg  in the morning and 600mg  in the evening 06/21/18  Yes Hagler, Apolonio Schneiders, MD  guaiFENesin (MUCINEX) 600 MG 12 hr tablet Take 600 mg by mouth 2 (two) times daily as needed (congestion).   Yes [provider]  LINZESS 72 MCG capsule TAKE 1 CAPSULE (72 MCG TOTAL) BY MOUTH DAILY BEFORE BREAKFAST. Patient taking differently: Take 72 mcg by mouth daily before breakfast.  11/22/18  Yes Annitta Needs, NP  losartan (COZAAR) 100 MG tablet Take 1 tablet (100 mg total) by mouth daily. 11/30/17  Yes Tanda Rockers, MD  magnesium gluconate (MAGONATE) 500 MG tablet Take 500 mg by mouth daily.   Yes [provider]  metoprolol succinate (TOPROL-XL) 100 MG 24 hr tablet Take 1 tablet (100 mg total) by mouth 2 (two) times daily. 11/13/18  Yes Branch, Alphonse Guild, MD    omeprazole (PRILOSEC) 40 MG capsule TAKE 1 CAPSULE BY MOUTH EVERY DAY Patient taking differently: Take 40 mg by mouth daily.  04/25/18  Yes Caren Macadam, MD  torsemide (DEMADEX) 20 MG tablet Take 20 mg by mouth 2 (two) times daily.    Yes [provider]  trolamine salicylate (ASPERCREME) 10 % cream Apply 1 application topically as needed for muscle pain.   Yes [provider]  allopurinol (ZYLOPRIM) 100 MG tablet Take 100 mg by mouth daily. 05/17/19   [provider]  potassium chloride SA (K-DUR) 20 MEQ tablet Take 2 tablets (40 mEq total) by mouth daily. For 3 days Patient not taking: Reported on 06/18/2019 05/01/19   Barrett, Evelene Croon, PA-C    Inpatient Medications: Scheduled Meds:  allopurinol  100 mg Oral Daily   amLODipine  5 mg Oral Daily   aspirin EC  81 mg Oral Daily   enoxaparin (LOVENOX) injection  0.5 mg/kg Subcutaneous Q24H   FLUoxetine  40 mg Oral Daily   furosemide  60 mg Intravenous BID   gabapentin  300 mg Oral q morning - 10a   gabapentin  600 mg Oral QPM   linaclotide  72 mcg Oral QAC breakfast   losartan  100 mg Oral Daily   magnesium oxide  400 mg Oral Daily   metoprolol succinate  100 mg Oral BID   pantoprazole  40 mg Oral Daily   potassium chloride  20 mEq Oral BID   Continuous Infusions:  PRN Meds: acetaminophen, albuterol, baclofen, guaiFENesin, prochlorperazine  Allergies:    Allergies  Allergen Reactions   Iodinated Diagnostic Agents Swelling    EYES    Social History:   Social History   Socioeconomic History   Marital status: Single    Spouse name: Not on file   Number of children: Not on file   Years of education: Not on file   Highest education level: Not on file  Occupational History   Not on file  Social Needs   Financial resource strain: Not on file   Food insecurity    Worry: Not on file    Inability: Not on file   Transportation needs    Medical: Not on file    Non-medical: Not  on file  Tobacco Use   Smoking status: Former Smoker    Packs/day: 0.50    Years: 25.00    Pack years: 12.50  Types: Cigarettes    Quit date: 13    Years since quitting: 23.6   Smokeless tobacco: Former Systems developer    Types: [place] date: 1975   Tobacco comment: dipped tobacco during pregancy in 1975  Substance and Sexual Activity   Alcohol use: Yes   Drug use: No   Sexual activity: Yes    Partners: Male  Lifestyle   Physical activity    Days per week: Not on file    Minutes per session: Not on file   Stress: Not on file  Relationships   Social connections    Talks on phone: Not on file    Gets together: Not on file    Attends religious service: Not on file    Active member of club or organization: Not on file    Attends meetings of clubs or organizations: Not on file    Relationship status: Not on file   Intimate partner violence    Fear of current or ex partner: Not on file    Emotionally abused: Not on file    Physically abused: Not on file    Forced sexual activity: Not on file  Other Topics Concern   Not on file  Social History Narrative   Lives alone or son will stay with her at times. Cooks. Eats well.      Family History:    Family History  Problem Relation Age of Onset   Hypertension Mother    Heart disease Mother    Kidney disease Father    Stroke Father    Diabetes Sister    Kidney disease Sister    Heart disease Sister    Hypertension Sister    Heart disease Brother    Heart attack Son    Hypertension Son    Cancer Paternal Grandmother        Breast Cancer   Heart disease Brother    Colon cancer Neg Hx       Review of Systems    General:  No chills, fever, night sweats or weight changes.  Cardiovascular:  No chest pain, palpitations, paroxysmal nocturnal dyspnea. Positive for dyspnea on exertion, orthopnea, and edema.  Dermatological: No rash, lesions/masses Respiratory: No cough, Positive for dyspnea.    Urologic: No hematuria, dysuria Abdominal:   No nausea, vomiting, diarrhea, bright red blood per rectum, melena, or hematemesis Neurologic:  No visual changes, wkns, changes in mental status. All other systems reviewed and are otherwise negative except as noted above.  Physical Exam/Data    Vitals:   06/19/19 0032 06/19/19 0032 06/19/19 0459 06/19/19 0527  BP:  (!) 147/71  136/64  Pulse:  63  (!) 58  Resp:  16  16  Temp:  (!) 97.5 F (36.4 C)  98.1 F (36.7 C)  TempSrc:  Oral  Oral  SpO2: 97% 96%  94%  Weight: 104.7 kg  104.7 kg   Height: 5\' 7"  (1.702 m)       Intake/Output Summary (Last 24 hours) at 06/19/2019 0938 Last data filed at 06/19/2019 0900 Gross per 24 hour  Intake 375.68 ml  Output --  Net 375.68 ml   Filed Weights   06/19/19 0032 06/19/19 0459  Weight: 104.7 kg 104.7 kg   Body mass index is 36.15 kg/m.   General: Pleasant female appearing in NAD Psych: Normal affect. Neuro: Alert and oriented X 3. Moves all extremities spontaneously. HEENT: Normal  Neck: Supple without bruits or JVD. Lungs:  Resp regular  and unlabored, rales along bases bilaterally. Heart: RRR no s3, s4, 2/6 mid-diastolic murmur along Apex.  Abdomen: Soft, non-tender, non-distended, BS + x 4.  Extremities: No clubbing or cyanosis. 1+ pitting edema along lower extremities. DP/PT/Radials 2+ and equal bilaterally.  EKG:  The EKG was personally reviewed and demonstrates: NSR, HR 66, inferior Q waves, and computer generated QTc 546 ms with similar resulted when manually calculated.  Telemetry:  Telemetry was personally reviewed and demonstrates: NSR, HR in mid-50's to 60's with occasional PVC's. Episodes of NSVT up to 4 beats.    Labs/Studies     Relevant CV Studies:  Cardiac Catheterization: 02/2015 Diagnostic findings:  Left coronary artery: Left main angiographically normal Left anterior descending is a large vessel that wraps the apex. There is a large diagonal one vessel. Mild  irregularities noted in the LAD system Left circumflex artery is a large codominant artery. There is a large obtuse marginal one and a moderate-sized obtuse marginal 2 vessel. Mild irregularities noted in the circumflex system  Right coronary artery: Moderate sized codominant artery with no significant stenosis  Adverse events: The patient tolerated the procedure well with no complications noted  Left ventricular pressure 135/16 Aortic pressure 140/82 with a saturation of 84%   Calculations- cardiac index 2.73 Cardiac output 5.84   Echocardiogram: 10/2018 Study Conclusions  - Left ventricle: LVEF is depressed at approximately 40 to 45% with   hypokinesis worse in the inferior, inferoseptal walls. The cavity   size was normal. Wall thickness was normal. Systolic function was   mildly to moderately reduced. The estimated ejection fraction was   in the range of 40% to 45%. - Mitral valve: MV is thickened with restricted motion. Peak and   mean gradients through the valve are 21 and 5 mm Hg MVA by P T1/2   is 1.3 cm2 consistent with severe mitral stenosis Compared to   previous echo in 2018, MVA is smaller. - Left atrium: The atrium was severely dilated. - Right ventricle: Systolic function was mildly reduced. - Right atrium: The atrium was mildly dilated. - Pulmonary arteries: PA peak pressure: 46 mm Hg (S).   Laboratory Data:  Chemistry Recent Labs  Lab 06/18/19 2208 06/19/19 0556  NA 141 143  K 3.3* 3.6  CL 108 106  CO2 23 25  GLUCOSE 115* 99  BUN 33* 33*  CREATININE 1.22* 1.20*  CALCIUM 9.1 9.6  GFRNONAA 46* 47*  GFRAA 54* 55*  ANIONGAP 10 12    No results for input(s): PROT, ALBUMIN, AST, ALT, ALKPHOS, BILITOT in the last 168 hours. Hematology Recent Labs  Lab 06/18/19 2208  WBC 5.1  RBC 3.55*  HGB 10.4*  HCT 33.7*  MCV 94.9  MCH 29.3  MCHC 30.9  RDW 15.4  PLT 154   Cardiac EnzymesNo results for input(s): TROPONINI in the last 168 hours. No  results for input(s): TROPIPOC in the last 168 hours.  BNP Recent Labs  Lab 06/18/19 2208  BNP 1,113.0*    DDimer No results for input(s): DDIMER in the last 168 hours.  Radiology/Studies:  Dg Chest Portable 1 View  Result Date: 06/18/2019 CLINICAL DATA:  Dyspnea EXAM: PORTABLE CHEST 1 VIEW COMPARISON:  September 26, 2018 FINDINGS: There is cardiomegaly. Overlying median sternotomy wires and prosthetic mitral valve is seen. Aortic knob calcifications. There is prominent pulmonary vasculature. No acute osseous abnormality. IMPRESSION: Cardiomegaly and pulmonary vascular congestion. Electronically Signed   By: Prudencio Pair M.D.   On: 06/18/2019 22:40  Assessment & Plan    1. Acute on Chronic Combined Systolic and Diastolic CHF - she has a known reduced EF of 40-45% by echo in 10/2018. Presents with 3-4 weeks of worsening dyspnea on exertion, orthopnea, and edema in the setting of dietary and medication noncompliance. She has only been taking Torsemide 40mg  daily and sometimes only takes 20mg  daily instead of as prescribed as 40mg  daily alternating with 60mg  daily.  - BNP 1113. CXR showed cardiomegaly and pulmonary vascular congestion.  - agree with titration of IV Lasix to 60mg  BID. Follow I&O's along with daily weights. Repeat BMET in AM. Repeat echocardiogram to assess EF and her mitral valve. Importance of dietary and medication compliance reviewed with the patient.    2. History of Severe MR with Mitral Stenosis - she is s/p MV repair at [facility] in 02/2015 with radical Repair with resection of P2 Prolapse, 26 mm Physio II Ring Annuloplasty and Tricuspid Valve Repair with 26 mm Ring Annuloplasty and Cox Maze IV Procedure. Most recent echocardiogram in 10/2018 showed severe mitral stenosis as outlined above.  - was evaluated by Dr. Roxy Manns with CT Surgery in 12/2016 and redo MVR was not recommended at that time given the significant risks. Continued medical management was recommended.  -  would obtain a repeat echocardiogram to reassess valve gradients.   3. History of Atrial Fibrillation/ Palpitations - s/p MAZE procedure at the time of MVR in 2016. She does report occasional palpitations but telemetry thus far has shown PVC's with occasional episodes of NSVT up to 4 beats.  - continue to follow on telemetry. Could consider an event monitor as an outpatient. Most recent monitor in 2017 showed NSR with PVC's. She did have an EKG at her PCP's office yesterday and I have called their office and requested these records. Continue to follow on telemetry during admission.  - continue Toprol-XL. Keep Mg ~ 2.0 and K+ ~ 4.0 given episodes of NSVT.   4. HTN - BP has been stable since admission. Continue PTA Amlodipine, Losartan, and Toprol-XL.   5. Prolonged QT - initial EKG showed NSR, HR 66, inferior Q waves, and computer generated QTc 546 ms with similar results when manually calculated. Will recheck 12-Lead EKG this AM.    For questions or updates, please contact [place] Please consult [email or web address] for contact info under Cardiology/STEMI.  Signed, Erma Heritage, PA-C 06/19/2019, 9:38 AM Pager: [phone]  The patient was seen and examined, and I agree with the history, physical exam, assessment and plan as documented above, with modifications as noted below. I have also personally reviewed all relevant documentation, old records, labs, and both radiographic and cardiovascular studies. I have also independently interpreted old and new ECG's.  Briefly, this is a 63 year old woman who normally sees Dr. Harl Bowie with after mentioned cardiac history with respect to valvular heart disease.  I personally reviewed her most recent echocardiogram performed on 11/07/2018 which demonstrated mild to moderate reduced left ventricular systolic function, LVEF 40 to 45%, inferior and inferoseptal hypokinesis, with severe mitral stenosis, severe left atrial dilatation, and mild right  atrial dilatation with mildly reduced right ventricular systolic function.  She has been having increasing shortness of breath and bilateral leg swelling over the past several weeks.  She admitted to several providers including myself that rather than taking torsemide 60 mg daily she sometimes takes 40 mg and sometimes only 20 mg due to frequent urination.  She also consumes a diet rich in sodium including potato chips and  bacon.  She also consumes fast food.  Chest x-ray showed pulmonary vascular congestion with an elevated BNP of 1113.  High-sensitivity troponins were nonspecifically elevated.  She has been started on IV Lasix which has been increased to 60 mg twice daily.  At the time of my examination an echocardiogram is being performed.  She was evaluated in 2018 by Dr. Roxy Manns and she was felt not to be a suitable candidate for redo mitral valve replacement surgery.  This may need to be reconsidered if transvalvular gradients have increased further.  While she underwent a MAZE procedure in 2016, she is at high risk for the development of atrial fibrillation given severe left atrial enlargement and severe mitral stenosis.  Thus far, only PVCs have been noted on telemetry.  I personally reviewed ECG which shows sinus rhythm with old inferolateral infarct and prolonged QT interval.  A repeat ECG has been ordered.   Kate Sable, MD, [facility]  06/19/2019 10:12 AM

## 2019-06-19 NOTE — Clinical Social Work Note (Signed)
Toni [name] from [employer] advised that patient was seen at the [facility] and had indicated that recently orders had been sent to her for oxygen. She requested that patient's need for oxygen be assessed while in hospital. Attending notified.    Mohit Zirbes, Clydene Pugh, LCSW

## 2019-06-19 NOTE — Progress Notes (Signed)
*  PRELIMINARY RESULTS* Echocardiogram 2D Echocardiogram has been performed.  [name] 06/19/2019, 10:45 AM

## 2019-06-20 DIAGNOSIS — I5043 Acute on chronic combined systolic (congestive) and diastolic (congestive) heart failure: Secondary | ICD-10-CM | POA: Diagnosis not present

## 2019-06-20 DIAGNOSIS — Z9889 Other specified postprocedural states: Secondary | ICD-10-CM

## 2019-06-20 DIAGNOSIS — Z8679 Personal history of other diseases of the circulatory system: Secondary | ICD-10-CM

## 2019-06-20 DIAGNOSIS — R9431 Abnormal electrocardiogram [ECG] [EKG]: Secondary | ICD-10-CM | POA: Diagnosis not present

## 2019-06-20 DIAGNOSIS — I10 Essential (primary) hypertension: Secondary | ICD-10-CM | POA: Diagnosis not present

## 2019-06-20 DIAGNOSIS — R7989 Other specified abnormal findings of blood chemistry: Secondary | ICD-10-CM | POA: Diagnosis not present

## 2019-06-20 LAB — RENAL FUNCTION PANEL
Albumin: 3.8 g/dL (ref 3.5–5.0)
Anion gap: 10 (ref 5–15)
BUN: 28 mg/dL — ABNORMAL HIGH (ref 8–23)
CO2: 24 mmol/L (ref 22–32)
Calcium: 9.6 mg/dL (ref 8.9–10.3)
Chloride: 107 mmol/L (ref 98–111)
Creatinine, Ser: 1.03 mg/dL — ABNORMAL HIGH (ref 0.44–1.00)
GFR calc Af Amer: >60 mL/min (ref >60)
GFR calc non Af Amer: 57 mL/min — ABNORMAL LOW (ref >60)
Glucose, Bld: 115 mg/dL — ABNORMAL HIGH (ref 70–99)
Phosphorus: 3.5 mg/dL (ref 2.5–4.6)
Potassium: 3.5 mmol/L (ref 3.5–5.1)
Sodium: 141 mmol/L (ref 135–145)

## 2019-06-20 LAB — TROPONIN I (HIGH SENSITIVITY): Troponin I (High Sensitivity): 14 ng/L (ref <18)

## 2019-06-20 MED ORDER — ASPIRIN EC 81 MG PO TBEC: 81.0000 mg | DELAYED_RELEASE_TABLET | Freq: Every day | ORAL | 3 refills | Status: AC

## 2019-06-20 MED ORDER — POTASSIUM CHLORIDE CRYS ER 20 MEQ PO TBCR: 20.0000 meq | EXTENDED_RELEASE_TABLET | Freq: Every day | ORAL | 3 refills | Status: AC

## 2019-06-20 MED ORDER — TORSEMIDE 20 MG PO TABS: 20.0000 mg | ORAL_TABLET | ORAL | 5 refills | Status: DC

## 2019-06-20 MED ORDER — FLUOXETINE HCL 20 MG PO CAPS: 20.0000 mg | ORAL_CAPSULE | Freq: Every day | ORAL | 2 refills | Status: AC

## 2019-06-20 NOTE — Plan of Care (Signed)
  Problem: Education: Goal: Knowledge of General Education information will improve Description: Including pain rating scale, medication(s)/side effects and non-pharmacologic comfort measures 06/20/2019 1400 by Angelyn Punt, RN Outcome: Adequate for Discharge 06/20/2019 0804 by Angelyn Punt, RN Outcome: Progressing   Problem: Health Behavior/Discharge Planning: Goal: Ability to manage health-related needs will improve 06/20/2019 1400 by Angelyn Punt, RN Outcome: Adequate for Discharge 06/20/2019 0804 by Angelyn Punt, RN Outcome: Progressing   Problem: Clinical Measurements: Goal: Ability to maintain clinical measurements within normal limits will improve 06/20/2019 1400 by Angelyn Punt, RN Outcome: Adequate for Discharge 06/20/2019 0804 by Angelyn Punt, RN Outcome: Progressing Goal: Will remain free from infection 06/20/2019 1400 by Angelyn Punt, RN Outcome: Adequate for Discharge 06/20/2019 0804 by Angelyn Punt, RN Outcome: Progressing Goal: Diagnostic test results will improve 06/20/2019 1400 by Angelyn Punt, RN Outcome: Adequate for Discharge 06/20/2019 0804 by Angelyn Punt, RN Outcome: Progressing Goal: Respiratory complications will improve 06/20/2019 1400 by Angelyn Punt, RN Outcome: Adequate for Discharge 06/20/2019 0804 by Angelyn Punt, RN Outcome: Progressing Goal: Cardiovascular complication will be avoided 06/20/2019 1400 by Angelyn Punt, RN Outcome: Adequate for Discharge 06/20/2019 0804 by Angelyn Punt, RN Outcome: Progressing   Problem: Activity: Goal: Risk for activity intolerance will decrease 06/20/2019 1400 by Angelyn Punt, RN Outcome: Adequate for Discharge 06/20/2019 0804 by Angelyn Punt, RN Outcome: Progressing   Problem: Nutrition: Goal: Adequate nutrition will be maintained 06/20/2019 1400 by Angelyn Punt, RN Outcome: Adequate for  Discharge 06/20/2019 0804 by Angelyn Punt, RN Outcome: Progressing   Problem: Coping: Goal: Level of anxiety will decrease 06/20/2019 1400 by Angelyn Punt, RN Outcome: Adequate for Discharge 06/20/2019 0804 by Angelyn Punt, RN Outcome: Progressing   Problem: Elimination: Goal: Will not experience complications related to bowel motility 06/20/2019 1400 by Angelyn Punt, RN Outcome: Adequate for Discharge 06/20/2019 0804 by Angelyn Punt, RN Outcome: Progressing Goal: Will not experience complications related to urinary retention 06/20/2019 1400 by Angelyn Punt, RN Outcome: Adequate for Discharge 06/20/2019 0804 by Angelyn Punt, RN Outcome: Progressing   Problem: Pain Managment: Goal: General experience of comfort will improve 06/20/2019 1400 by Angelyn Punt, RN Outcome: Adequate for Discharge 06/20/2019 0804 by Angelyn Punt, RN Outcome: Progressing   Problem: Safety: Goal: Ability to remain free from injury will improve 06/20/2019 1400 by Angelyn Punt, RN Outcome: Adequate for Discharge 06/20/2019 0804 by Angelyn Punt, RN Outcome: Progressing   Problem: Skin Integrity: Goal: Risk for impaired skin integrity will decrease 06/20/2019 1400 by Angelyn Punt, RN Outcome: Adequate for Discharge 06/20/2019 0804 by Angelyn Punt, RN Outcome: Progressing

## 2019-06-20 NOTE — Discharge Summary (Signed)
Toni [name], is a 63 y.o. female  DOB [birth year]  MRN [id number].  Admission date:  06/18/2019  Admitting Physician  Reubin Milan, MD  Discharge Date:  06/20/2019   Primary MD  Dara Lords, NP  Recommendations for primary care physician for things to follow:   1)Very low-salt diet advised (avoid ham, bacon, sausage, chips and other high salt food items) 2)Weigh yourself daily, call if you gain more than 3 pounds in 1 day or more than 5 pounds in 1 week as your diuretic medications may need to be adjusted 3)Limit your Fluid  intake to no more than 60 ounces (1.8 Liters) per day 4)Take torsemide/Demadex 3 tablets (60 mg) on Tuesdays Thursdays Saturdays and Sundays, take 2 tablets 40 mg on Monday Wednesday Friday 5) take potassium supplementations as advised 6) use CPAP machine for sleep apnea as prescribed  Admission Diagnosis  Acute on chronic combined systolic and diastolic heart failure ([name]) [I50.43]   Discharge Diagnosis  Acute on chronic combined systolic and diastolic heart failure ([name]) [I50.43]    Principal Problem:   Acute on chronic combined systolic and diastolic heart failure ([name]) Active Problems:   HYPOKALEMIA   Normocytic anemia   Essential hypertension   Elevated troponin   Anxiety with depression   Prolonged QT interval      Past Medical History:  Diagnosis Date   Congestive heart failure, unspecified    Diastolic heart failure   Degeneration of lumbar or lumbosacral intervertebral disc    Depression    Diverticulosis    Dyspnea     chronic multifactorial large component of deconditioning   Esophageal reflux    Gout    History of kidney stones    Insomnia    Working third shift   Mitral stenosis    Secondary to under-sized ring annuloplasty following mitral valve repair   Mitral valve disorders(424.0)    Mild to moderate mitral regurgitation     Mitral valve insufficiency and aortic valve insufficiency    Morbid obesity ([name])    Normocytic anemia    Other chest pain    Other dyspnea and respiratory abnormality    Paroxysmal atrial fibrillation ([name])    Stable no recurrence   Peripheral edema    chronic   S/P Maze operation for atrial fibrillation 02/27/2015   S/P mitral valve repair 02/27/2015   "radical repair" including resection of P2 segment of posterior leaflet with 26 mm Edwards Physio II annuloplasty ring   S/P tricuspid valve repair 02/27/2015   26 mm annuloplasty ring   Unspecified essential hypertension     Past Surgical History:  Procedure Laterality Date   APPENDECTOMY     AXILLARY LYMPH NODE DISSECTION Left 01/04/2018   Procedure: LEFT AXILLARY LYMPH NODE DISSECTION;  Surgeon: [name], MD;  Location: [facility];  Service: General;  Laterality: Left;   BREAST BIOPSY Left 2009   CARDIAC VALVE SURGERY  02/27/2015   [facility] Cella. Mitral Valve Repair. Radical Repair with resection of P2 Prolapse,  26 mm Physio II Ring Annuloplasty, Tricuspid Valve Repair (26 mm Ring Annuloplasty, Cox Maze IV Procedure on 02/27/2015   CHOLECYSTECTOMY     COLONOSCOPY  2011   Dr. Laural Golden: 25mm polyp from hepatic flexure (tubular adenoma)   COLONOSCOPY N/A 11/10/2016   Dr. Gala Romney: Diverticulosis. Next colonoscopy 7 years, January 2025   ESOPHAGOGASTRODUODENOSCOPY  2005    Dr. Gala Romney : normal esophagus s/p dilation. Small hiatal hernia noted. Felt improved after dilation.    ESOPHAGOGASTRODUODENOSCOPY N/A 11/10/2016   Dr. Gala Romney: Moderate-sized hiatal hernia, esophagus normal status post empiric dilation for history of dysphagia   HEEL SPUR SURGERY Right    MALONEY DILATION N/A 11/10/2016   Procedure: Venia Minks DILATION;  Surgeon: Daneil Dolin, MD;  Location: [facility];  Service: Endoscopy;  Laterality: N/A;   MINIMALLY INVASIVE TRICUSPID VALVE REPAIR  02/27/2015   [place]   MITRAL VALVE  REPAIR  02/27/2015   [place]   PARTIAL MASTECTOMY WITH NEEDLE LOCALIZATION Left 01/04/2018   Procedure: LEFT PARTIAL MASTECTOMY STATUS POST NEEDLE LOCALIZATION;  Surgeon: [name], MD;  Location: [facility];  Service: General;  Laterality: Left;   TEE WITHOUT CARDIOVERSION N/A 10/18/2016   Procedure: TRANSESOPHAGEAL ECHOCARDIOGRAM (TEE);  Surgeon: Arnoldo Lenis, MD;  Location: [facility];  Service: Endoscopy;  Laterality: N/A;   TOTAL ABDOMINAL HYSTERECTOMY     TOTAL KNEE ARTHROPLASTY Right    TUBAL LIGATION       HPI  from the history and physical done on the day of admission:    Chief Complaint: Shortness of breath.  HPI: Toni [name] is a 63 y.o. female with medical history significant of chronic combined systolic and diastolic heart failure, mitral stenosis/mitral valve insufficiency with a history of annuloplasty, paroxysmal atrial fibrillation, history of Cox-Maze procedure, hypertension, DDD of the lumbar spine, anxiety, depression, insomnia, diverticulosis, GERD, gout, urolithiasis, morbid obesity, sleep apnea not on CPAP, former smoker that quit in 1997 who is coming into the emergency department due to several weeks of progressively worse dyspnea and lower extremity edema.  She doubled her dose of her diuretics yesterday and states that her weight decreased from 236 to 230 pounds.  She went to see her PCP today due to persistent dyspnea and had an O2 sat of 85% on room air.  She was subsequently advised to come to the emergency department.  She uses 2-3 pillows to sleep.  She denies chest pain, dizziness, diaphoresis, but complains of palpitations.  She denies fever, chills, sore throat, rhinorrhea, wheezing or hemoptysis.  Denies abdominal pain, nausea or emesis, diarrhea, melena or hematochezia.  She frequently gets constipation.  She complains of urgency when using diuretics, but denies dysuria or hematuria.  No polyuria, polydipsia,  polyphagia or blurred vision.  ED Course: Initial vital signs temperature 98.3 F, pulse 66, respirations 17, blood pressure 139/78 mmHg and O2 sats 92% on room air.  She received 60 mEq of potassium p.o. and 60 mg of furosemide IM in the ED.  CBC shows a white count of 5.1, hemoglobin 10.4 g/dL and platelets 154.  Troponin #1 was 25 and number 226 ng/L.  BNP was 1113.0 pg/mL.  Potassium was 3.3 mmol/L.  The rest of the electrolytes are within normal limits.  Glucose 115, BUN 33, creatinine 1.22 mg/dL. EKG was sinus rhythm with all inferolateral infarct and prolonged QT interval.  Chest radiograph showed cardiomegaly and pulmonary vascular congestion.   Hospital Course:    Brief  Summary 63 y.o.femalewith past medical history of severe MR (s/p MV repair at [facility] in 02/2015 with radical Repair with resection of P2 Prolapse, 26 mm Physio II Ring AnnuloplastyandTricuspid Valve Repairwith26 mm Ring AnnuloplastyandCox Maze IV Procedure, severe mitral stenosis by echo in 10/2018), mild nonobstructive CAD by cath in 02/2015, chronic combined systolic and diastolic CHF (EF 09-73% by echo in 10/2018), atrial fibrillation (no known recurrence since MAZE procedure in 2016, no longer on anticoagulation), HTN, and HLD admitted on 06/18/2019 with acute on chronic CHF exacerbation in the setting of noncompliance with salt and fluid restriction and incomplete compliance with diuretics   A/p 1)HFrEF--- history of combined systolic and diastolic dysfunction CHF -admitted with acute on chronic CHF exacerbation, EF per echo this admission 50% (up from 40 to 45% about 7 months ago) -Patient admits to noncompliance with salt and fluid restriction and incomplete compliance with diuretics -Responded well to IV  Lasix 60 mg every 12 hours,  -Weight is down almost 20 pounds -Fluid balance is negative -Discharge home on torsemide as prescribed -Continue losartan 100 mg daily -Continue metoprolol XL 100 mg  twice daily -Cardiology consult appreciated, please see discharge instructions  2)Valvular Heart Disease--- severe mitral stenosis--- s/p MV repair at [facility] in 02/2015 with radical Repair with resection of P2 Prolapse, 26 mm Physio II Ring AnnuloplastyandTricuspid Valve Repairwith26 mm Ring AnnuloplastyandCox Maze IV Procedure.  Echo this admission again shows severe mitral stenosis as outlined above.  - was evaluated by Dr. Roxy Manns with CT Surgery in 12/2016 and redo MVR was not recommended at that time given the significant risks. Continued medical management was recommended.   3)H/o Afib- s/p MAZE procedure at the time of MVR in 02/2015, appears to be in sinus, no Full anticoagulation -Continue Toprol-XL and aspirin -Patient with recent history of PVCs/NSVT -Prolonged QT noted , fluoxetine  was initially discontinued, may discharge on Prozac 20 mg daily down from 40 mg  4)Acute  hypoxic respiratory failure--- secondary to underlying COPD and CHF exacerbation,  -Patient is a reformed smoker with some component of COPD -Hypoxia resolved after treatment with IV diuretics for CHF exacerbation, -Patient does not meet criteria for home O2 at this time  5) anxiety and depression--- stable, fluoxetine  was initially discontinued, may discharge on Prozac 20 mg daily down from 40 mg due to prolonged QT  6) morbid obesity/OSA-patient is not on CPAP at home, patient apparently recently had a sleep study, she will follow-up with PCP to get his CPAP machine -Compliance with CPAP once again she is advised  Disposition/-overall stable, much improved, hypoxia has resolved  Code Status : full  Family Communication:   NA (patient is alert, awake and coherent)   Disposition Plan  : Home   Consults  :  cardiology  Discharge Condition: stable  Follow UP--PCP and cardiology as advised   Consults obtained -cardiology  Diet and Activity recommendation:  As advised  Discharge  Instructions    Discharge Instructions    Call MD for:  difficulty breathing, headache or visual disturbances   Complete by: As directed    Call MD for:  persistant dizziness or light-headedness   Complete by: As directed    Call MD for:  persistant nausea and vomiting   Complete by: As directed    Call MD for:  temperature >100.4   Complete by: As directed    Diet - low sodium heart healthy   Complete by: As directed    Discharge instructions   Complete  by: As directed    1)Very low-salt diet advised (avoid ham, bacon, sausage, chips and other high salt food items) 2)Weigh yourself daily, call if you gain more than 3 pounds in 1 day or more than 5 pounds in 1 week as your diuretic medications may need to be adjusted 3)Limit your Fluid  intake to no more than 60 ounces (1.8 Liters) per day 4)Take torsemide/Demadex 3 tablets (60 mg) on Tuesdays Thursdays Saturdays and Sundays, take 2 tablets 40 mg on Monday Wednesday Friday 5) take potassium supplementations as advised 6) use CPAP machine for sleep apnea as prescribed   Increase activity slowly   Complete by: As directed         Discharge Medications     Allergies as of 06/20/2019      Reactions   Iodinated Diagnostic Agents Swelling   EYES      Medication List    TAKE these medications   acetaminophen 500 MG tablet Commonly known as: TYLENOL Take 1,000 mg by mouth daily as needed for moderate pain or headache.   albuterol 108 (90 Base) MCG/ACT inhaler Commonly known as: VENTOLIN HFA Inhale 2 puffs into the lungs every 6 (six) hours as needed for wheezing or shortness of breath.   allopurinol 100 MG tablet Commonly known as: ZYLOPRIM Take 100 mg by mouth daily.   amLODipine 5 MG tablet Commonly known as: NORVASC Take 1 tablet (5 mg total) by mouth daily.   aspirin EC 81 MG tablet Take 1 tablet (81 mg total) by mouth daily with breakfast. What changed: when to take this   baclofen 20 MG tablet Commonly known  as: LIORESAL Take 20 mg by mouth daily as needed for muscle spasms.   FLUoxetine 20 MG capsule Commonly known as: PROZAC Take 1 capsule (20 mg total) by mouth daily. What changed:   medication strength  how much to take  how to take this  when to take this  additional instructions   gabapentin 300 MG capsule Commonly known as: NEURONTIN TAKE 1 CAPSULE BY MOUTH IN THE MORNING AND 2 CAPSULES IN THE EVENING What changed: See the new instructions.   guaiFENesin 600 MG 12 hr tablet Commonly known as: MUCINEX Take 600 mg by mouth 2 (two) times daily as needed (congestion).   Linzess 72 MCG capsule Generic drug: linaclotide TAKE 1 CAPSULE (72 MCG TOTAL) BY MOUTH DAILY BEFORE BREAKFAST. What changed: See the new instructions.   losartan 100 MG tablet Commonly known as: COZAAR Take 1 tablet (100 mg total) by mouth daily.   magnesium gluconate 500 MG tablet Commonly known as: MAGONATE Take 500 mg by mouth daily.   metoprolol succinate 100 MG 24 hr tablet Commonly known as: Toprol XL Take 1 tablet (100 mg total) by mouth 2 (two) times daily.   omeprazole 40 MG capsule Commonly known as: PRILOSEC TAKE 1 CAPSULE BY MOUTH EVERY DAY What changed: how much to take   potassium chloride SA 20 MEQ tablet Commonly known as: K-DUR Take 1 tablet (20 mEq total) by mouth daily. What changed:   how much to take  additional instructions   torsemide 20 MG tablet Commonly known as: DEMADEX Take 1 tablet (20 mg total) by mouth See admin instructions. Take 3 tablets (60 mg) on Tuesdays Thursdays Saturdays and Sundays, take 2 tablets 40 mg on Monday Wednesday Friday What changed:   when to take this  additional instructions   trolamine salicylate 10 % cream Commonly known as: ASPERCREME Apply 1  application topically as needed for muscle pain.       Major procedures and Radiology Reports - PLEASE review detailed and final reports for all details, in brief -   Dg Chest  Portable 1 View  Result Date: 06/18/2019 CLINICAL DATA:  Dyspnea EXAM: PORTABLE CHEST 1 VIEW COMPARISON:  September 26, 2018 FINDINGS: There is cardiomegaly. Overlying median sternotomy wires and prosthetic mitral valve is seen. Aortic knob calcifications. There is prominent pulmonary vasculature. No acute osseous abnormality. IMPRESSION: Cardiomegaly and pulmonary vascular congestion. Electronically Signed   By: Prudencio Pair M.D.   On: 06/18/2019 22:40    Micro Results   Recent Results (from the past 240 hour(s))  SARS Coronavirus 2 [facility] order, Performed in [facility] hospital lab) Nasopharyngeal Nasopharyngeal Swab     Status: None   Collection Time: 06/18/19 10:08 PM   Specimen: Nasopharyngeal Swab  Result Value Ref Range Status   SARS Coronavirus 2 NEGATIVE NEGATIVE Final    Comment: (NOTE) If result is NEGATIVE SARS-CoV-2 target nucleic acids are NOT DETECTED. The SARS-CoV-2 RNA is generally detectable in upper and lower  respiratory specimens during the acute phase of infection. The lowest  concentration of SARS-CoV-2 viral copies this assay can detect is 250  copies / mL. A negative result does not preclude SARS-CoV-2 infection  and should not be used as the sole basis for treatment or other  patient management decisions.  A negative result may occur with  improper specimen collection / handling, submission of specimen other  than nasopharyngeal swab, presence of viral mutation(s) within the  areas targeted by this assay, and inadequate number of viral copies  (<250 copies / mL). A negative result must be combined with clinical  observations, patient history, and epidemiological information. If result is POSITIVE SARS-CoV-2 target nucleic acids are DETECTED. The SARS-CoV-2 RNA is generally detectable in upper and lower  respiratory specimens dur ing the acute phase of infection.  Positive  results are indicative of active infection with SARS-CoV-2.  Clinical  correlation  with patient history and other diagnostic information is  necessary to determine patient infection status.  Positive results do  not rule out bacterial infection or co-infection with other viruses. If result is PRESUMPTIVE POSTIVE SARS-CoV-2 nucleic acids MAY BE PRESENT.   A presumptive positive result was obtained on the submitted specimen  and confirmed on repeat testing.  While 2019 novel coronavirus  (SARS-CoV-2) nucleic acids may be present in the submitted sample  additional confirmatory testing may be necessary for epidemiological  and / or clinical management purposes  to differentiate between  SARS-CoV-2 and other Sarbecovirus currently known to infect humans.  If clinically indicated additional testing with an alternate test  methodology [id number]) is advised. The SARS-CoV-2 RNA is generally  detectable in upper and lower respiratory sp ecimens during the acute  phase of infection. The expected result is Negative. Fact Sheet for Patients:  StrictlyIdeas.no Fact Sheet for Healthcare Providers: BankingDealers.co.za This test is not yet approved or cleared by the [place] FDA and has been authorized for detection and/or diagnosis of SARS-CoV-2 by FDA under an Emergency Use Authorization (EUA).  This EUA will remain in effect (meaning this test can be used) for the duration of the COVID-19 declaration under Section 564(b)(1) of the Act, 21 U.S.C. section 360bbb-3(b)(1), unless the authorization is terminated or revoked sooner. Performed at [facility], [address] [place], [place] [address]        Today   Subjective  Toni [name] today has no new complaints   -No shortness of breath at rest, -Hypoxia has resolved        Patient has been seen and examined prior to discharge   Objective   Blood pressure (!) 143/79, pulse 63, temperature 97.8 F (36.6 C), temperature source Oral, resp. rate 20, height 5\' 7"   (1.702 m), weight 103.8 kg, SpO2 96 %.   Intake/Output Summary (Last 24 hours) at 06/20/2019 1301 Last data filed at 06/20/2019 0900 Gross per 24 hour  Intake 480 ml  Output 2100 ml  Net -1620 ml    Exam Gen:- Awake Alert, no acute distress , speaking in complete sentences HEENT:- [place].AT, No sclera icterus Neck-Supple Neck,No JVD,.  Lungs-improved air movement, no rales no wheezing CV- S1, S2 normal, regular Abd-  +ve B.Sounds, Abd Soft, No tenderness,    Extremity/Skin:-Resolved edema,   good pulses Psych-affect is appropriate, oriented x3 Neuro-no new focal deficits, no tremors    Data Review   CBC w Diff:  Lab Results  Component Value Date   WBC 5.1 06/18/2019   HGB 10.4 (L) 06/18/2019   HCT 33.7 (L) 06/18/2019   PLT 154 06/18/2019   LYMPHOPCT 23 06/18/2019   MONOPCT 7 06/18/2019   EOSPCT 2 06/18/2019   BASOPCT 0 06/18/2019    CMP:  Lab Results  Component Value Date   NA 141 06/20/2019   K 3.5 06/20/2019   CL 107 06/20/2019   CO2 24 06/20/2019   BUN 28 (H) 06/20/2019   CREATININE 1.03 (H) 06/20/2019   CREATININE 1.09 (H) 05/16/2018   PROT 7.2 05/16/2018   ALBUMIN 3.8 06/20/2019   BILITOT 0.6 05/16/2018   ALKPHOS 152 (H) 01/02/2018   AST 25 05/16/2018   ALT 22 05/16/2018  .   Total Discharge time is about 33 minutes  Roxan Hockey M.D on 06/20/2019 at 1:01 PM  Go to [email or web address] -  for contact info  [facility] - Office  [phone]

## 2019-06-20 NOTE — Progress Notes (Addendum)
Progress Note  Patient Name: Toni [name] Date of Encounter: 06/20/2019  Primary Cardiologist: Carlyle Dolly, MD   Subjective   Patient is sitting comfortably in her chair.  She feels her breathing is back to baseline.  She denies palpitations.  Inpatient Medications    Scheduled Meds: . allopurinol  100 mg Oral Daily  . amLODipine  5 mg Oral Daily  . aspirin EC  81 mg Oral Daily  . enoxaparin (LOVENOX) injection  0.5 mg/kg Subcutaneous Q24H  . furosemide  60 mg Intravenous BID  . gabapentin  300 mg Oral q morning - 10a  . gabapentin  600 mg Oral QPM  . linaclotide  72 mcg Oral QAC breakfast  . losartan  100 mg Oral Daily  . magnesium oxide  400 mg Oral Daily  . metoprolol succinate  100 mg Oral BID  . pantoprazole  40 mg Oral Daily  . potassium chloride  20 mEq Oral BID   Continuous Infusions:  PRN Meds: acetaminophen, albuterol, baclofen, guaiFENesin, prochlorperazine   Vital Signs    Vitals:   06/19/19 2030 06/19/19 2125 06/20/19 0500 06/20/19 0618  BP:  128/63  (!) 143/79  Pulse:  66  63  Resp:  20  20  Temp:  98.3 F (36.8 C)  97.8 F (36.6 C)  TempSrc:  Oral  Oral  SpO2: 95% 94%  96%  Weight:   103.8 kg   Height:        Intake/Output Summary (Last 24 hours) at 06/20/2019 1030 Last data filed at 06/20/2019 0900 Gross per 24 hour  Intake 720 ml  Output 2100 ml  Net -1380 ml   Filed Weights   06/19/19 0032 06/19/19 0459 06/20/19 0500  Weight: 104.7 kg 104.7 kg 103.8 kg    Telemetry    Sinus rhythm with PVCs- Personally Reviewed  ECG    None today- Personally Reviewed  Physical Exam   GEN: No acute distress.   Neck: No JVD Cardiac: RRR, soft apical diastolic murmur Respiratory:  Faint crackles bilaterally. GI: Soft, nontender, non-distended  MS:  Trace bilateral lower extremity edema; No deformity. Neuro:  Nonfocal  Psych: Normal affect   Labs    Chemistry Recent Labs  Lab 06/18/19 2208 06/19/19 0556 06/20/19 0443  NA 141  143 141  K 3.3* 3.6 3.5  CL 108 106 107  CO2 23 25 24   GLUCOSE 115* 99 115*  BUN 33* 33* 28*  CREATININE 1.22* 1.20* 1.03*  CALCIUM 9.1 9.6 9.6  ALBUMIN  --   --  3.8  GFRNONAA 46* 47* 57*  GFRAA 54* 55* >60  ANIONGAP 10 12 10      Hematology Recent Labs  Lab 06/18/19 2208  WBC 5.1  RBC 3.55*  HGB 10.4*  HCT 33.7*  MCV 94.9  MCH 29.3  MCHC 30.9  RDW 15.4  PLT 154    Cardiac EnzymesNo results for input(s): TROPONINI in the last 168 hours. No results for input(s): TROPIPOC in the last 168 hours.   BNP Recent Labs  Lab 06/18/19 2208  BNP 1,113.0*     DDimer No results for input(s): DDIMER in the last 168 hours.   Radiology    Dg Chest Portable 1 View  Result Date: 06/18/2019 CLINICAL DATA:  Dyspnea EXAM: PORTABLE CHEST 1 VIEW COMPARISON:  September 26, 2018 FINDINGS: There is cardiomegaly. Overlying median sternotomy wires and prosthetic mitral valve is seen. Aortic knob calcifications. There is prominent pulmonary vasculature. No acute osseous abnormality. IMPRESSION: Cardiomegaly  and pulmonary vascular congestion. Electronically Signed   By: Prudencio Pair M.D.   On: 06/18/2019 22:40    Cardiac Studies   Echocardiogram 06/19/2019:   1. The left ventricle has a visually estimated ejection fraction of 50%. The cavity size was normal. There is mild concentric left ventricular hypertrophy. Diastolic dysfunction, grade indeterminate. Elevated left atrial and left ventricular  end-diastolic pressures.  2. The right ventricle has moderately reduced systolic function. The cavity was mildly enlarged. There is no increase in right ventricular wall thickness.  3. Left atrial size was severely dilated.  4. The aortic valve is tricuspid. Aortic valve regurgitation is moderate by color flow Doppler. Moderate aortic annular calcification noted.  5. The mitral valve is degenerative. Mild thickening of the mitral valve leaflet. There is moderate mitral annular calcification present.  Severe mitral valve stenosis. MVA 1.08 cm squared.  6. The tricuspid valve is grossly normal.  7. The aorta is normal in size and structure.  8. Mild to moderate pulmonic regurgitation.  9. Right atrial size was mildly dilated. 10. The inferior vena cava was dilated in size with >50% respiratory variability.  Patient Profile     63 y.o. female with past medical history of severe MR (s/p MV repair at [facility] in 02/2015 with radical Repair with resection of P2 Prolapse, 26 mm Physio II Ring Annuloplasty and Tricuspid Valve Repair with 26 mm Ring Annuloplasty and Cox Maze IV Procedure, severe mitral stenosis by echo in 10/2018), mild nonobstructive CAD by cath in 02/2015, chronic combined systolic and diastolic CHF (EF 49-44% by echo in 10/2018), atrial fibrillation (no known recurrence since MAZE procedure in 2016, no longer on anticoagulation), HTN, and HLD who is being seen today for the evaluation of CHF at the request of Dr. Denton Brick.   Assessment & Plan    1.  Acute on chronic combined heart failure LVEF 50% by echocardiogram yesterday.  Current decompensation likely due to dietary and medication noncompliance.  I instructed her on the importance of a low-sodium diet and to take medications as prescribed.  Over 1 L output in the last 24 hours.  Renal function is stable.  2.  History of severe mitral regurgitation with mitral stenosis She is s/p MV repair at [facility] in 02/2015 with radical Repair with resection of P2 Prolapse, 26 mm Physio II Ring Annuloplasty and Tricuspid Valve Repair with 26 mm Ring Annuloplasty and Cox Maze IV Procedure. Most recent echocardiogram in 10/2018 showed severe mitral stenosis as outlined above.  She was evaluated by Dr. Roxy Manns with CT Surgery in 12/2016 and redo MVR was not recommended at that time given the significant risks. Continued medical management was recommended.  Mean gradient is 12 mmHg with RVSP of 37 mmHg and severe left atrial enlargement.  3.   History of atrial fibrillation  While she underwent a MAZE procedure in 2016, she is at high risk for the development of atrial fibrillation given severe left atrial enlargement and severe mitral stenosis.  Thus far, only PVCs have been noted on telemetry.    4.  Hypertension Blood pressure is mildly elevated today but overall has been stable.  No changes to therapy.  5.  Prolonged QT Repeat ECG yesterday showed prolonged QT of 560 ms along with T wave inversions in V1 through V3 similar to tracings in March 2019.  Fluoxetine has been discontinued.   [facility] will sign off.   Medication Recommendations:  As above  Other recommendations (labs, testing, etc):  As  above Follow up as an outpatient:  With Dr. Harl Bowie  For questions or updates, please contact [place] Please consult [email or web address] for contact info under Cardiology/STEMI.      Signed, Kate Sable, MD  06/20/2019, 10:30 AM

## 2019-06-20 NOTE — Progress Notes (Signed)
Pt discharged via wheelchair to granddaughter's car. All personal belongings with patient, discharge instructions reviewed and patient stated understanding. VSS.

## 2019-06-20 NOTE — Discharge Instructions (Signed)
1)Very low-salt diet advised (avoid ham, bacon, sausage, chips and other high salt food items) 2)Weigh yourself daily, call if you gain more than 3 pounds in 1 day or more than 5 pounds in 1 week as your diuretic medications may need to be adjusted 3)Limit your Fluid  intake to no more than 60 ounces (1.8 Liters) per day 4)Take torsemide/Demadex 3 tablets (60 mg) on Tuesdays Thursdays Saturdays and Sundays, take 2 tablets 40 mg on Monday Wednesday Friday 5) take potassium supplementations as advised 6) use CPAP machine for sleep apnea as prescribed   Follow up with your Cardiologist (Dr. Harl Bowie) in about 1 month and your PCP Eustaquio Maize Dekoninck at [facility]) in about 1 to 2 weeks.

## 2019-06-20 NOTE — Plan of Care (Signed)
  Problem: Education: Goal: Knowledge of General Education information will improve Description: Including pain rating scale, medication(s)/side effects and non-pharmacologic comfort measures 06/20/2019 0804 by Angelyn Punt, RN Outcome: Progressing 06/19/2019 1855 by Angelyn Punt, RN Outcome: Progressing   Problem: Health Behavior/Discharge Planning: Goal: Ability to manage health-related needs will improve 06/20/2019 0804 by Angelyn Punt, RN Outcome: Progressing 06/19/2019 1855 by Angelyn Punt, RN Outcome: Progressing   Problem: Clinical Measurements: Goal: Ability to maintain clinical measurements within normal limits will improve 06/20/2019 0804 by Angelyn Punt, RN Outcome: Progressing 06/19/2019 1855 by Angelyn Punt, RN Outcome: Progressing Goal: Will remain free from infection 06/20/2019 0804 by Angelyn Punt, RN Outcome: Progressing 06/19/2019 1855 by Angelyn Punt, RN Outcome: Progressing Goal: Diagnostic test results will improve 06/20/2019 0804 by Angelyn Punt, RN Outcome: Progressing 06/19/2019 1855 by Angelyn Punt, RN Outcome: Progressing Goal: Respiratory complications will improve 06/20/2019 0804 by Angelyn Punt, RN Outcome: Progressing 06/19/2019 1855 by Angelyn Punt, RN Outcome: Progressing Goal: Cardiovascular complication will be avoided 06/20/2019 0804 by Angelyn Punt, RN Outcome: Progressing 06/19/2019 1855 by Angelyn Punt, RN Outcome: Progressing   Problem: Activity: Goal: Risk for activity intolerance will decrease 06/20/2019 0804 by Angelyn Punt, RN Outcome: Progressing 06/19/2019 1855 by Angelyn Punt, RN Outcome: Progressing   Problem: Nutrition: Goal: Adequate nutrition will be maintained 06/20/2019 0804 by Angelyn Punt, RN Outcome: Progressing 06/19/2019 1855 by Angelyn Punt, RN Outcome: Progressing   Problem: Coping: Goal: Level  of anxiety will decrease 06/20/2019 0804 by Angelyn Punt, RN Outcome: Progressing 06/19/2019 1855 by Angelyn Punt, RN Outcome: Progressing   Problem: Elimination: Goal: Will not experience complications related to bowel motility 06/20/2019 0804 by Angelyn Punt, RN Outcome: Progressing 06/19/2019 1855 by Angelyn Punt, RN Outcome: Progressing Goal: Will not experience complications related to urinary retention 06/20/2019 0804 by Angelyn Punt, RN Outcome: Progressing 06/19/2019 1855 by Angelyn Punt, RN Outcome: Progressing   Problem: Pain Managment: Goal: General experience of comfort will improve 06/20/2019 0804 by Angelyn Punt, RN Outcome: Progressing 06/19/2019 1855 by Angelyn Punt, RN Outcome: Progressing   Problem: Safety: Goal: Ability to remain free from injury will improve 06/20/2019 0804 by Angelyn Punt, RN Outcome: Progressing 06/19/2019 1855 by Angelyn Punt, RN Outcome: Progressing   Problem: Skin Integrity: Goal: Risk for impaired skin integrity will decrease 06/20/2019 0804 by Angelyn Punt, RN Outcome: Progressing 06/19/2019 1855 by Angelyn Punt, RN Outcome: Progressing

## 2019-07-31 ENCOUNTER — Telehealth: Payer: Self-pay | Admitting: Cardiology

## 2019-07-31 ENCOUNTER — Telehealth (INDEPENDENT_AMBULATORY_CARE_PROVIDER_SITE_OTHER): Payer: PRIVATE HEALTH INSURANCE | Admitting: Cardiology

## 2019-07-31 ENCOUNTER — Encounter: Payer: Self-pay | Admitting: Cardiology

## 2019-07-31 VITALS — BP 116/64 | Ht 67.0 in | Wt 233.0 lb

## 2019-07-31 DIAGNOSIS — I5022 Chronic systolic (congestive) heart failure: Secondary | ICD-10-CM

## 2019-07-31 DIAGNOSIS — Z9889 Other specified postprocedural states: Secondary | ICD-10-CM | POA: Diagnosis not present

## 2019-07-31 DIAGNOSIS — I050 Rheumatic mitral stenosis: Secondary | ICD-10-CM | POA: Diagnosis not present

## 2019-07-31 NOTE — Telephone Encounter (Signed)
Virtual Visit Pre-Appointment Phone Call  "(Name), I am calling you today to discuss your upcoming appointment. We are currently trying to limit exposure to the virus that causes COVID-19 by seeing patients at home rather than in the office."  1. "What is the BEST phone number to call the day of the visit?" - [phone] 2.   3. Do you have or have access to (through a family member/friend) a smartphone with video capability that we can use for your visit?" a. If yes - list this number in appt notes as cell (if different from BEST phone #) and list the appointment type as a VIDEO visit in appointment notes b. If no - list the appointment type as a PHONE visit in appointment notes  4. Confirm consent - "In the setting of the current Covid19 crisis, you are scheduled for a (phone or video) visit with your provider on (date) at (time).  Just as we do with many in-office visits, in order for you to participate in this visit, we must obtain consent.  If you'd like, I can send this to your mychart (if signed up) or email for you to review.  Otherwise, I can obtain your verbal consent now.  All virtual visits are billed to your insurance company just like a normal visit would be.  By agreeing to a virtual visit, we'd like you to understand that the technology does not allow for your provider to perform an examination, and thus may limit your provider's ability to fully assess your condition. If your provider identifies any concerns that need to be evaluated in person, we will make arrangements to do so.  Finally, though the technology is pretty good, we cannot assure that it will always work on either your or our end, and in the setting of a video visit, we may have to convert it to a phone-only visit.  In either situation, we cannot ensure that we have a secure connection.  Are you willing to proceed?" STAFF: Did the patient verbally acknowledge consent to telehealth visit? Document YES/NO  here:YES  5. Advise patient to be prepared - "Two hours prior to your appointment, go ahead and check your blood pressure, pulse, oxygen saturation, and your weight (if you have the equipment to check those) and write them all down. When your visit starts, your provider will ask you for this information. If you have an Apple Watch or Kardia device, please plan to have heart rate information ready on the day of your appointment. Please have a pen and paper handy nearby the day of the visit as well."  6. Give patient instructions for MyChart download to smartphone OR Doximity/Doxy.me as below if video visit (depending on what platform provider is using)  7. Inform patient they will receive a phone call 15 minutes prior to their appointment time (may be from unknown caller ID) so they should be prepared to answer    TELEPHONE CALL NOTE  Toni [name] has been deemed a candidate for a follow-up tele-health visit to limit community exposure during the Covid-19 pandemic. I spoke with the patient via phone to ensure availability of phone/video source, confirm preferred email & phone number, and discuss instructions and expectations.  I reminded Toni [name] to be prepared with any vital sign and/or heart rhythm information that could potentially be obtained via home monitoring, at the time of her visit. I reminded Toni [name] to expect a phone call prior to her visit.  [name] 07/31/2019 9:22 AM   INSTRUCTIONS FOR DOWNLOADING THE MYCHART APP TO SMARTPHONE  - The patient must first make sure to have activated MyChart and know their login information - If Apple, go to [employer] and type in MyChart in the search bar and download the app. If Android, ask patient to go to [employer] and type in [place] in the search bar and download the app. The app is free but as with any other app downloads, their phone may require them to verify saved payment information or Apple/Android password.   - The patient will need to then log into the app with their MyChart username and password, and select [facility] as their healthcare provider to link the account. When it is time for your visit, go to the MyChart app, find appointments, and click Begin Video Visit. Be sure to Select Allow for your device to access the Microphone and Camera for your visit. You will then be connected, and your provider will be with you shortly.  **If they have any issues connecting, or need assistance please contact MyChart service desk [phone])**  **If using a computer, in order to ensure the best quality for their visit they will need to use either of the following Internet Browsers: [employer], or Google Chrome**  IF USING DOXIMITY or DOXY.ME - The patient will receive a link just prior to their visit by text.     FULL LENGTH CONSENT FOR TELE-HEALTH VISIT   I hereby voluntarily request, consent and authorize [facility] and its employed or contracted physicians, physician assistants, nurse practitioners or other licensed health care professionals (the Practitioner), to provide me with telemedicine health care services (the Services") as deemed necessary by the treating Practitioner. I acknowledge and consent to receive the Services by the Practitioner via telemedicine. I understand that the telemedicine visit will involve communicating with the Practitioner through live audiovisual communication technology and the disclosure of certain medical information by electronic transmission. I acknowledge that I have been given the opportunity to request an in-person assessment or other available alternative prior to the telemedicine visit and am voluntarily participating in the telemedicine visit.  I understand that I have the right to withhold or withdraw my consent to the use of telemedicine in the course of my care at any time, without affecting my right to future care or treatment, and that  the Practitioner or I may terminate the telemedicine visit at any time. I understand that I have the right to inspect all information obtained and/or recorded in the course of the telemedicine visit and may receive copies of available information for a reasonable fee.  I understand that some of the potential risks of receiving the Services via telemedicine include:   Delay or interruption in medical evaluation due to technological equipment failure or disruption;  Information transmitted may not be sufficient (e.g. poor resolution of images) to allow for appropriate medical decision making by the Practitioner; and/or   In rare instances, security protocols could fail, causing a breach of personal health information.  Furthermore, I acknowledge that it is my responsibility to provide information about my medical history, conditions and care that is complete and accurate to the best of my ability. I acknowledge that Practitioner's advice, recommendations, and/or decision may be based on factors not within their control, such as incomplete or inaccurate data provided by me or distortions of diagnostic images or specimens that may result from electronic transmissions. I understand that the  practice of medicine is not an Chief Strategy Officer and that Practitioner makes no warranties or guarantees regarding treatment outcomes. I acknowledge that I will receive a copy of this consent concurrently upon execution via email to the email address I last provided but may also request a printed copy by calling the office of [place].    I understand that my insurance will be billed for this visit.   I have read or had this consent read to me.  I understand the contents of this consent, which adequately explains the benefits and risks of the Services being provided via telemedicine.   I have been provided ample opportunity to ask questions regarding this consent and the Services and have had my questions answered to  my satisfaction.  I give my informed consent for the services to be provided through the use of telemedicine in my medical care  By participating in this telemedicine visit I agree to the above.

## 2019-07-31 NOTE — Patient Instructions (Addendum)
Your physician recommends that you schedule a follow-up appointment in: [address] [place] has recommended you make the following change in your medication:   TAKE TORSEMIDE 60 MG DAILY FOR 4 DAYS THEN RESUME NORMAL DOSE   PLEASE CALL us Monday WITH AN UPDATE ON YOUR WEIGHTS   Thank you for choosing [facility]!!

## 2019-07-31 NOTE — Progress Notes (Signed)
Virtual Visit via Telephone Note   This visit type was conducted due to national recommendations for restrictions regarding the COVID-19 Pandemic (e.g. social distancing) in an effort to limit this patient's exposure and mitigate transmission in our community.  Due to her co-morbid illnesses, this patient is at least at moderate risk for complications without adequate follow up.  This format is felt to be most appropriate for this patient at this time.  The patient did not have access to video technology/had technical difficulties with video requiring transitioning to audio format only (telephone).  All issues noted in this document were discussed and addressed.  No physical exam could be performed with this format.  Please refer to the patient's chart for her  consent to telehealth for [facility].   Date:  07/31/2019   ID:  Toni [name], DOB 1956-11-01, MRN [id number]  Patient Location: Home Provider Location: Office  PCP:  Toni Lords, NP  Cardiologist:  Toni Dolly, MD  Electrophysiologist:  None   Evaluation Performed:  Follow-Up Visit  Chief Complaint:  Follow up visit  History of Present Illness:    Toni [name] is a 63 y.o. female seen today for a focused visit on history of mitral valve disease and chronic systolic/diastolic HF    1. Mitral regurgitation s/p, now with post repair elevated gradient.  - history of previous MV repair at [facility] 02/2015 - Radical Repair with resection of P2 Prolapse, 26 mm Physio II Ring Annuloplasty, Tricuspid Valve Repair (26 mm Ring Annuloplasty, Cox Maze IV Procedure on 02/27/2015 - cath prior to surgery showed no significant CAD - post repair echo 02/2015 showed no MR, mild TR. There are no reported gradients across the valves in the report  02/2015 PFTs normal(on care everywhere) -10/2017echo showed elevated gradients across her repaired MV and TV consistent with post repair stenosis.  - TEE 10/2016 showed  restricted motion of posterior MV leaflet with mitral stenosis, mean resting gradient of 8 mmHg. - 10/2017 echo LVEF 45-50%, mean grad MV 11 mmHg.PASP 58. Moderate RV dysfunction 10/2018 echo LVEF 40-45%, LVEF 40-45%, MV mean grad 5, PASP 46  06/2019 echo: LVEF 50%, mod RV dysfunction, moderate AI, severe mitral stenosis mean grad  12 mmHg - no recent SOb/DOE   2.Chronic systolic/diastolic HF - noted on [id number] CXR. BNP up to 2514.  - 10/2018 echo LVEF 40-45%, mean MV gradient 5 - last visit we increased torsemide to 60mg  daily, later changed to 60mg  alternating with 40mg  due to elevation in Cr which improved with change  -   - admission 06/2019 with volume overload in setting of poor dieatary and medication compliance - - SOB has improved. Some LE edema at times. Home weights are around 227 though up to 229 lbs. Discharge weight 228 lbs.  Takes torsemide 60mg  4 days week and 40 mg the other days.   3. Afib - no recurrecne since MAZE procedure, has not been on anticoag  - 14 day event monitor 2017 without afib ??heart monitor??     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    Past Medical History:  Diagnosis Date   Congestive heart failure, unspecified    Diastolic heart failure   Degeneration of lumbar or lumbosacral intervertebral disc    Depression    Diverticulosis    Dyspnea  chronic multifactorial large component of deconditioning   Esophageal reflux    Gout    History of kidney stones    Insomnia    Working third shift   Mitral stenosis    Secondary to under-sized ring annuloplasty following mitral valve repair   Mitral valve disorders(424.0)    Mild to moderate mitral regurgitation   Mitral valve insufficiency and aortic valve insufficiency    Morbid obesity ([name])    Normocytic anemia     Other chest pain    Other dyspnea and respiratory abnormality    Paroxysmal atrial fibrillation ([name])    Stable no recurrence   Peripheral edema    chronic   S/P Maze operation for atrial fibrillation 02/27/2015   S/P mitral valve repair 02/27/2015   "radical repair" including resection of P2 segment of posterior leaflet with 26 mm Edwards Physio II annuloplasty ring   S/P tricuspid valve repair 02/27/2015   26 mm annuloplasty ring   Unspecified essential hypertension    Past Surgical History:  Procedure Laterality Date   APPENDECTOMY     AXILLARY LYMPH NODE DISSECTION Left 01/04/2018   Procedure: LEFT AXILLARY LYMPH NODE DISSECTION;  Surgeon: [name], MD;  Location: [facility]S;  Service: General;  Laterality: Left;   BREAST BIOPSY Left 2009   CARDIAC VALVE SURGERY  02/27/2015   [facility]. Mitral Valve Repair. Radical Repair with resection of P2 Prolapse, 26 mm Physio II Ring Annuloplasty, Tricuspid Valve Repair (26 mm Ring Annuloplasty, Cox Maze IV Procedure on 02/27/2015   CHOLECYSTECTOMY     COLONOSCOPY  2011   Dr. Laural Golden: 72mm polyp from hepatic flexure (tubular adenoma)   COLONOSCOPY N/A 11/10/2016   Dr. Gala Romney: Diverticulosis. Next colonoscopy 7 years, January 2025   ESOPHAGOGASTRODUODENOSCOPY  2005    Dr. Gala Romney : normal esophagus s/p dilation. Small hiatal hernia noted. Felt improved after dilation.    ESOPHAGOGASTRODUODENOSCOPY N/A 11/10/2016   Dr. Gala Romney: Moderate-sized hiatal hernia, esophagus normal status post empiric dilation for history of dysphagia   HEEL SPUR SURGERY Right    MALONEY DILATION N/A 11/10/2016   Procedure: Venia Minks DILATION;  Surgeon: Daneil Dolin, MD;  Location: [facility];  Service: Endoscopy;  Laterality: N/A;   MINIMALLY INVASIVE TRICUSPID VALVE REPAIR  02/27/2015   [place]   MITRAL VALVE REPAIR  02/27/2015   [place]   PARTIAL MASTECTOMY WITH NEEDLE LOCALIZATION Left  01/04/2018   Procedure: LEFT PARTIAL MASTECTOMY STATUS POST NEEDLE LOCALIZATION;  Surgeon: [name], MD;  Location: [facility];  Service: General;  Laterality: Left;   TEE WITHOUT CARDIOVERSION N/A 10/18/2016   Procedure: TRANSESOPHAGEAL ECHOCARDIOGRAM (TEE);  Surgeon: Arnoldo Lenis, MD;  Location: [facility];  Service: Endoscopy;  Laterality: N/A;   TOTAL ABDOMINAL HYSTERECTOMY     TOTAL KNEE ARTHROPLASTY Right    TUBAL LIGATION       No outpatient medications have been marked as taking for the 07/31/19 encounter (Appointment) with Arnoldo Lenis, MD.     Allergies:   Iodinated diagnostic agents   Social History   Tobacco Use   Smoking status: Former Smoker    Packs/day: 0.50    Years: 25.00    Pack years: 12.50    Types: Cigarettes    Quit date: 1997    Years since quitting: 23.7   Smokeless tobacco: Former Systems developer    Types: [place] date: 1975   Tobacco comment: dipped tobacco during pregancy in  1975  Substance Use Topics   Alcohol use: Yes   Drug use: No     Family Hx: The patient's family history includes Cancer in her paternal grandmother; Diabetes in her sister; Heart attack in her son; Heart disease in her brother, brother, mother, and sister; Hypertension in her mother, sister, and son; Kidney disease in her father and sister; Stroke in her father. There is no history of Colon cancer.  ROS:   Please see the history of present illness.     All other systems reviewed and are negative.   Prior CV studies:   The following studies were reviewed today:  08/2016 echo Study Conclusions  - Left ventricle: The cavity size was normal. Wall thickness was increased in a pattern of mild LVH. Systolic function was normal. The estimated ejection fraction was in the range of 50% to 55%. Wall motion was normal; there were no regional wall motion abnormalities. The study was not technically sufficient to allow evaluation of LV diastolic  dysfunction due to atrial fibrillation. - Aortic valve: Mildly calcified annulus. Trileaflet; mildly thickened leaflets. There was mild regurgitation. Valve area (VTI): 2.47 cm^2. Valve area (Vmax): 2.47 cm^2. Valve area (Vmean): 2.43 cm^2. - Mitral valve: The is a MV anular ring present s/p mitral valve repair. There is an increased gradient across the MV. Mean gradient 12 mmHg. - Left atrium: The atrium was severely dilated. - Right ventricle: The cavity size was mildly dilated. Systolic function was mildly to moderately reduced. - Right atrium: The atrium was mildly dilated. - Tricuspid valve: A TV anular ring is present. Mean gradient across the TV of 7 mmHg. - Pulmonary arteries: Systolic pressure was mildly increased. PA peak pressure: 31 mm Hg (S). - Technically adequate study.   10/2016 TEE Study Conclusions  - Left ventricle: The cavity size was normal. Wall thickness was normal. Systolic function was normal. The estimated ejection fraction was in the range of 55% to 60%. - Mitral valve: The posterior leaflet is moderately thickened with severely restricted motion. There is a moderate to severe gradient across the MV of 8 mmHg. There was mild regurgitation. Mean gradient (D): 8 mm Hg. - Left atrium: The atrium was severely dilated. - Right atrium: No evidence of thrombus in the atrial cavity or appendage.   10/2017 echo Study Conclusions  - Left ventricle: The cavity size was normal. Wall thickness was increased in a pattern of mild LVH. Systolic function was mildly reduced. The estimated ejection fraction was in the range of 45% to 50%. Diffuse hypokinesis. The study is not technically sufficient to allow evaluation of LV diastolic function. - Aortic valve: Mildly calcified annulus. Trileaflet. Mean gradient (S): 3 mm Hg. Valve area (VTI): 2.21 cm^2. - Mitral valve: The findings are consistent with severe  stenosis. There was trivial regurgitation. Mean gradient (D): 11 mm Hg. Planimetered valve area: 0.9 cm^2. Valve area by continuity equation (using LVOT flow): 0.68 cm^2. - Left atrium: The atrium was severely dilated. - Right ventricle: The cavity size was mildly dilated. Systolic function was moderately reduced. - Right atrium: The atrium was mildly dilated. Central venous pressure (est): 15 mm Hg. - Atrial septum: No defect or patent foramen ovale was identified. - Tricuspid valve: There was mild regurgitation. - Pulmonary arteries: PA peak pressure: 58 mm Hg (S). - Pericardium, extracardiac: There was no pericardial effusion.  Impressions:  - Mild LVH with LVEF approximately 45-50%. Indeterminate diastolic function. Severe left atrial enlargement. Findings consistent with severe mitral stenosis  as detailed above. Moderately reduced right ventricular contraction. Mild tricuspid regurgitation with elevated PASP estimated 58 mmHg.   10/2018 echo Study Conclusions  - Left ventricle: LVEF is depressed at approximately 40 to 45% with hypokinesis worse in the inferior, inferoseptal walls. The cavity size was normal. Wall thickness was normal. Systolic function was mildly to moderately reduced. The estimated ejection fraction was in the range of 40% to 45%. - Mitral valve: MV is thickened with restricted motion. Peak and mean gradients through the valve are 21 and 5 mm Hg MVA by P T1/2 is 1.3 cm2 consistent with severe mitral stenosis Compared to previous echo in 2018, MVA is smaller. - Left atrium: The atrium was severely dilated. - Right ventricle: Systolic function was mildly reduced. - Right atrium: The atrium was mildly dilated. - Pulmonary arteries: PA peak pressure: 46 mm Hg (S).  Labs/Other Tests and Data Reviewed:    EKG:  No ECG reviewed.  Recent Labs: 06/18/2019: B Natriuretic Peptide 1,113.0; Hemoglobin 10.4; Magnesium 2.0;  Platelets 154; TSH 0.863 06/20/2019: BUN 28; Creatinine, Ser 1.03; Potassium 3.5; Sodium 141   Recent Lipid Panel Lab Results  Component Value Date/Time   CHOL 119 09/16/2017 11:48 AM   TRIG 70 09/16/2017 11:48 AM   HDL 37 (L) 09/16/2017 11:48 AM   CHOLHDL 3.2 09/16/2017 11:48 AM   LDLCALC 67 09/16/2017 11:48 AM    Wt Readings from Last 3 Encounters:  06/20/19 228 lb 13.4 oz (103.8 kg)  05/01/19 235 lb 12.8 oz (107 kg)  01/05/19 228 lb 9.6 oz (103.7 kg)     Objective:    Vital Signs:  LMP  (LMP Unknown)    Today's Vitals   07/31/19 1408  BP: 116/64  SpO2: 93%  Weight: 233 lb (105.7 kg)  Height: 5\' 7"  (1.702 m)   Body mass index is 36.49 kg/m. Normal affect. Normal speech pattern and tone. Comfortable, no apparent distress, no audible signs of SOB or wheezing.   ASSESSMENT & PLAN:    1. Valvular heart disease/SOB/ Chronic systolic/diastolic HF -weight trending up since recent discharge. I think a weight around 228 lbs has been good for her - she will take torsemide 60mg  daily x 4 days, update [place] on her weights on Monday      COVID-19 Education: The signs and symptoms of COVID-19 were discussed with the patient and how to seek care for testing (follow up with PCP or arrange E-visit).  The importance of social distancing was discussed today.  Time:   Today, I have spent 20 minutes with the patient with telehealth technology discussing the above problems.     Medication Adjustments/Labs and Tests Ordered: Current medicines are reviewed at length with the patient today.  Concerns regarding medicines are outlined above.   Tests Ordered: No orders of the defined types were placed in this encounter.   Medication Changes: No orders of the defined types were placed in this encounter.   Follow Up:  Virtual Visit in 2 month(s)  Signed, Toni Dolly, MD  07/31/2019 12:48 PM    [place]

## 2019-08-06 ENCOUNTER — Telehealth: Payer: Self-pay | Admitting: Cardiology

## 2019-08-06 DIAGNOSIS — I5022 Chronic systolic (congestive) heart failure: Secondary | ICD-10-CM

## 2019-08-06 DIAGNOSIS — Z79899 Other long term (current) drug therapy: Secondary | ICD-10-CM

## 2019-08-06 NOTE — Telephone Encounter (Signed)
Patient called to report her weights:   08-01-2019 234.1 08-02-2019 227.6 08-03-2019 230.1 08-04-2019 230 08-05-2019 229.6 08-06-2019 228.6

## 2019-08-07 ENCOUNTER — Other Ambulatory Visit: Payer: Self-pay | Admitting: *Deleted

## 2019-08-07 DIAGNOSIS — I5022 Chronic systolic (congestive) heart failure: Secondary | ICD-10-CM

## 2019-08-07 DIAGNOSIS — Z79899 Other long term (current) drug therapy: Secondary | ICD-10-CM

## 2019-08-07 MED ORDER — TORSEMIDE 20 MG PO TABS: 40.0000 mg | ORAL_TABLET | ORAL | 1 refills | Status: DC

## 2019-08-07 NOTE — Telephone Encounter (Signed)
Patient informed and verbalized understanding of plan. Lab order sent to [facility] lab per patient request.

## 2019-08-07 NOTE — Telephone Encounter (Signed)
Weights have improved. I would go back to torsemide 60mg  alternating days with 40mg . If weight goes above 230 lbs then take 60mg  daily until back down and then resume alternating days with 60mg  one day and 40mg  the next. Needs BMET/Mg in 1 week   Zandra Abts MD

## 2019-08-13 ENCOUNTER — Other Ambulatory Visit: Payer: Self-pay

## 2019-08-13 ENCOUNTER — Other Ambulatory Visit (HOSPITAL_COMMUNITY)
Admission: RE | Admit: 2019-08-13 | Discharge: 2019-08-13 | Disposition: A | Discharge status: Home / Self Care | Payer: Medicaid Other | Source: Ambulatory Visit | Attending: Cardiology | Admitting: Cardiology

## 2019-08-13 DIAGNOSIS — I5022 Chronic systolic (congestive) heart failure: Secondary | ICD-10-CM | POA: Insufficient documentation

## 2019-08-13 DIAGNOSIS — Z79899 Other long term (current) drug therapy: Secondary | ICD-10-CM | POA: Insufficient documentation

## 2019-08-13 LAB — BASIC METABOLIC PANEL
Anion gap: 8 (ref 5–15)
BUN: 37 mg/dL — ABNORMAL HIGH (ref 8–23)
CO2: 22 mmol/L (ref 22–32)
Calcium: 9.2 mg/dL (ref 8.9–10.3)
Chloride: 107 mmol/L (ref 98–111)
Creatinine, Ser: 1.45 mg/dL — ABNORMAL HIGH (ref 0.44–1.00)
GFR calc Af Amer: 44 mL/min — ABNORMAL LOW (ref >60)
GFR calc non Af Amer: 38 mL/min — ABNORMAL LOW (ref >60)
Glucose, Bld: 89 mg/dL (ref 70–99)
Potassium: 4.5 mmol/L (ref 3.5–5.1)
Sodium: 137 mmol/L (ref 135–145)

## 2019-08-13 LAB — MAGNESIUM: Magnesium: 2.3 mg/dL (ref 1.7–2.4)

## 2019-08-15 ENCOUNTER — Telehealth: Payer: Self-pay | Admitting: *Deleted

## 2019-08-15 DIAGNOSIS — I5022 Chronic systolic (congestive) heart failure: Secondary | ICD-10-CM

## 2019-08-15 NOTE — Telephone Encounter (Signed)
Pt verified she was taking torsemide 60 mg alternating with 40 mg will decrease 40 mg daily - will have labs done again in 3 weeks at [facility]

## 2019-08-15 NOTE — Telephone Encounter (Signed)
-----   Message from Arnoldo Lenis, MD sent at 08/14/2019  2:14 PM EDT ----- Current diuretic dosing is putting too much stress on the kidneys. Verify she is taking torsemide 60mg  alternative days with 40mg . If so lower to 40mg  daily,may take 60mg  on days she has edema. Repeat BMET/Mg in 3 weeks   Zandra Abts MD

## 2019-08-21 ENCOUNTER — Other Ambulatory Visit: Payer: Self-pay | Admitting: Cardiology

## 2019-08-24 ENCOUNTER — Other Ambulatory Visit: Payer: Self-pay | Admitting: Cardiology

## 2019-08-24 NOTE — Telephone Encounter (Signed)
°*  STAT* If patient is at the pharmacy, call can be transferred to refill team.   1. Which medications need to be refilled? (please list name of each medication and dose if known)   gabapentin (NEURONTIN) 300 MG     2. Which pharmacy/location (including street and city if local pharmacy) is medication to be sent to    [facility]  [place]   3. Do they need a 30 day or 90 day supply?     States Dr. Mannie Stabile called in medication but she is no longer in practice

## 2019-08-24 NOTE — Telephone Encounter (Signed)
Pt aware that we do not manage gabapentin and that pcp would need to refill

## 2019-09-05 ENCOUNTER — Other Ambulatory Visit (HOSPITAL_COMMUNITY)
Admission: RE | Admit: 2019-09-05 | Discharge: 2019-09-05 | Disposition: A | Discharge status: Home / Self Care | Payer: Medicaid Other | Source: Ambulatory Visit | Attending: Cardiology | Admitting: Cardiology

## 2019-09-05 DIAGNOSIS — I5022 Chronic systolic (congestive) heart failure: Secondary | ICD-10-CM | POA: Insufficient documentation

## 2019-09-05 LAB — BASIC METABOLIC PANEL
Anion gap: 9 (ref 5–15)
BUN: 31 mg/dL — ABNORMAL HIGH (ref 8–23)
CO2: 23 mmol/L (ref 22–32)
Calcium: 9.4 mg/dL (ref 8.9–10.3)
Chloride: 105 mmol/L (ref 98–111)
Creatinine, Ser: 1.33 mg/dL — ABNORMAL HIGH (ref 0.44–1.00)
GFR calc Af Amer: 48 mL/min — ABNORMAL LOW (ref >60)
GFR calc non Af Amer: 42 mL/min — ABNORMAL LOW (ref >60)
Glucose, Bld: 71 mg/dL (ref 70–99)
Potassium: 4 mmol/L (ref 3.5–5.1)
Sodium: 137 mmol/L (ref 135–145)

## 2019-09-14 ENCOUNTER — Telehealth: Payer: Self-pay | Admitting: *Deleted

## 2019-09-14 NOTE — Telephone Encounter (Signed)
Pt aware - routed to pcp  

## 2019-09-14 NOTE — Telephone Encounter (Signed)
-----   Message from Arnoldo Lenis, MD sent at 09/10/2019  3:55 PM EST ----- Kidney funciton has improved and back to prior range, continue current meds   J BranchMD

## 2019-10-09 ENCOUNTER — Other Ambulatory Visit (HOSPITAL_COMMUNITY): Payer: Self-pay | Admitting: General Practice

## 2019-10-09 ENCOUNTER — Other Ambulatory Visit: Payer: Self-pay | Admitting: General Practice

## 2019-10-09 DIAGNOSIS — M79606 Pain in leg, unspecified: Secondary | ICD-10-CM

## 2019-10-09 DIAGNOSIS — N183 Chronic kidney disease, stage 3 unspecified: Secondary | ICD-10-CM

## 2019-10-15 ENCOUNTER — Encounter: Payer: Self-pay | Admitting: Cardiology

## 2019-10-15 ENCOUNTER — Ambulatory Visit (INDEPENDENT_AMBULATORY_CARE_PROVIDER_SITE_OTHER): Payer: Self-pay | Admitting: Cardiology

## 2019-10-15 ENCOUNTER — Other Ambulatory Visit: Payer: Self-pay

## 2019-10-15 VITALS — BP 126/78 | HR 62 | Ht 67.0 in | Wt 237.2 lb

## 2019-10-15 DIAGNOSIS — I5022 Chronic systolic (congestive) heart failure: Secondary | ICD-10-CM

## 2019-10-15 DIAGNOSIS — Z9889 Other specified postprocedural states: Secondary | ICD-10-CM

## 2019-10-15 DIAGNOSIS — I480 Paroxysmal atrial fibrillation: Secondary | ICD-10-CM

## 2019-10-15 DIAGNOSIS — I10 Essential (primary) hypertension: Secondary | ICD-10-CM

## 2019-10-15 NOTE — Progress Notes (Signed)
Clinical Summary Toni [name] is a 63 y.o.female seen today for follow up of the following medical problems.      1. Mitral regurgitation s/p, now with post repair elevated gradient.  - history of previous MV repair at [facility] 02/2015 - Radical Repair with resection of P2 Prolapse, 26 mm Physio II Ring Annuloplasty, Tricuspid Valve Repair (26 mm Ring Annuloplasty, Cox Maze IV Procedure on 02/27/2015 - cath prior to surgery showed no significant CAD - post repair echo 02/2015 showed no MR, mild TR. There are no reported gradients across the valves in the report  02/2015 PFTs normal(on care everywhere) -10/2017echo showed elevated gradients across her repaired MV and TV consistent with post repair stenosis.  - TEE 10/2016 showed restricted motion of posterior MV leaflet with mitral stenosis, mean resting gradient of 8 mmHg. - 10/2017 echo LVEF 45-50%, mean grad MV 11 mmHg.PASP 58. Moderate RV dysfunction 10/2018 echo LVEF 40-45%, LVEF 40-45%, MV mean grad 5, PASP 46  06/2019 echo: LVEF 50%, mod RV dysfunction, moderate AI, severe mitral stenosis mean grad  12 mmHg  - breathing is up and down. Has some swelling at times. She takes torsemide 40mg  daily, will take 60mg  as needed. - home weights around 233 lbs. Lowest has been 228 lbs.    2.Chronic systolic/diastolic HF - noted on [id number] CXR. BNP up to 2514. - 10/2018 echo LVEF 40-45%, mean MV gradient 5 -previously we increased torsemide to 60mg  daily, later changed to 60mg  alternating with 40mg due to elevation in Cr which improved with change  - admission 06/2019 with volume overload in setting of poor dieatary and medication compliance      - elevation in Cr, we lowered torsemide to 40mg  daily, had been on torsemide alternating 60mg  and 40mg . Cr imrpoved back to prior range     3. Afib -no recurrecne since MAZE procedure, has not been on anticoag - 14 day event monitor 2017 without afib   - no recent  palpitaitons.   4. 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 Congestive heart failure, unspecified    Diastolic heart failure  . Degeneration of lumbar or lumbosacral intervertebral disc   . Depression   . Diverticulosis   . Dyspnea     chronic multifactorial large component of deconditioning  . Esophageal reflux   . Gout   . History of kidney stones   . Insomnia    Working third shift  . Mitral stenosis    Secondary to under-sized ring annuloplasty following mitral valve repair  . Mitral valve disorders(424.0)    Mild to moderate mitral regurgitation  . Mitral valve insufficiency and aortic valve insufficiency   . Morbid obesity ([place])   . Normocytic anemia   . Other chest pain   . Other dyspnea and respiratory abnormality   . Paroxysmal atrial fibrillation ([name])    Stable no recurrence  . Peripheral edema    chronic  . S/P Maze operation for atrial fibrillation 02/27/2015  . S/P mitral valve repair 02/27/2015   "radical repair" including resection of P2 segment of posterior leaflet with 26 mm Edwards Physio II annuloplasty ring  . S/P tricuspid valve repair 02/27/2015   26 mm annuloplasty ring  . Unspecified essential hypertension      Allergies  Allergen Reactions  . Iodinated Diagnostic Agents Swelling    EYES  Current Outpatient Medications  Medication Sig Dispense Refill  . acetaminophen (TYLENOL) 500 MG tablet Take 1,000 mg by mouth daily as needed for moderate pain or headache.    . albuterol (PROVENTIL HFA;VENTOLIN HFA) 108 (90 Base) MCG/ACT inhaler Inhale 2 puffs into the lungs every 6 (six) hours as needed for wheezing or shortness of breath. 1 Inhaler 3  . allopurinol (ZYLOPRIM) 100 MG tablet Take 100 mg by mouth 2 (two) times daily.     Marland Kitchen amLODipine (NORVASC) 5 MG tablet Take 1 tablet (5 mg total) by mouth daily. 90 tablet 1  . aspirin EC 81 MG  tablet Take 1 tablet (81 mg total) by mouth daily with breakfast. 90 tablet 3  . baclofen (LIORESAL) 20 MG tablet Take 20 mg by mouth daily as needed for muscle spasms.     Marland Kitchen FLUoxetine (PROZAC) 20 MG capsule Take 1 capsule (20 mg total) by mouth daily. 30 capsule 2  . gabapentin (NEURONTIN) 300 MG capsule TAKE 1 CAPSULE BY MOUTH IN THE MORNING AND 2 CAPSULES IN THE EVENING (Patient taking differently: Take 300-600 mg by mouth See admin instructions. 300mg  in the morning and 600mg  in the evening) 270 capsule 0  . guaiFENesin (MUCINEX) 600 MG 12 hr tablet Take 600 mg by mouth 2 (two) times daily as needed (congestion).    Marland Kitchen LINZESS 72 MCG capsule TAKE 1 CAPSULE (72 MCG TOTAL) BY MOUTH DAILY BEFORE BREAKFAST. (Patient taking differently: Take 72 mcg by mouth daily before breakfast. ) 30 capsule 11  . losartan (COZAAR) 100 MG tablet Take 1 tablet (100 mg total) by mouth daily. 30 tablet 11  . magnesium gluconate (MAGONATE) 500 MG tablet Take 500 mg by mouth daily.    . metoprolol succinate (TOPROL-XL) 100 MG 24 hr tablet TAKE 1 TABLET BY MOUTH TWICE A DAY 180 tablet 3  . omeprazole (PRILOSEC) 40 MG capsule TAKE 1 CAPSULE BY MOUTH EVERY DAY (Patient taking differently: Take 40 mg by mouth daily. ) 90 capsule 1  . potassium chloride SA (K-DUR) 20 MEQ tablet Take 1 tablet (20 mEq total) by mouth daily. 30 tablet 3  . torsemide (DEMADEX) 20 MG tablet Take 40 mg by mouth 2 (two) times daily.    Marland Kitchen trolamine salicylate (ASPERCREME) 10 % cream Apply 1 application topically as needed for muscle pain.     No current facility-administered medications for this visit.      Past Surgical History:  Procedure Laterality Date  . APPENDECTOMY    . AXILLARY LYMPH NODE DISSECTION Left 01/04/2018   Procedure: LEFT AXILLARY LYMPH NODE DISSECTION;  Surgeon: [name], MD;  Location: [facility];  Service: General;  Laterality: Left;  . BREAST BIOPSY Left 2009  . CARDIAC VALVE SURGERY  02/27/2015   [facility]. Mitral  Valve Repair. Radical Repair with resection of P2 Prolapse, 26 mm Physio II Ring Annuloplasty, Tricuspid Valve Repair (26 mm Ring Annuloplasty, Cox Maze IV Procedure on 02/27/2015  . CHOLECYSTECTOMY    . COLONOSCOPY  2011   Dr. Laural Golden: 17mm polyp from hepatic flexure (tubular adenoma)  . COLONOSCOPY N/A 11/10/2016   Dr. Gala Romney: Diverticulosis. Next colonoscopy 7 years, January 2025  . ESOPHAGOGASTRODUODENOSCOPY  2005    Dr. Gala Romney : normal esophagus s/p dilation. Small hiatal hernia noted. Felt improved after dilation.   . ESOPHAGOGASTRODUODENOSCOPY N/A 11/10/2016   Dr. Gala Romney: Moderate-sized hiatal hernia, esophagus normal status post empiric dilation for history of dysphagia  . HEEL SPUR SURGERY Right   . MALONEY DILATION  N/A 11/10/2016   Procedure: Venia Minks DILATION;  Surgeon: Daneil Dolin, MD;  Location: [facility];  Service: Endoscopy;  Laterality: N/A;  . MINIMALLY INVASIVE TRICUSPID VALVE REPAIR  02/27/2015   [place]  . MITRAL VALVE REPAIR  02/27/2015   [place]  . PARTIAL MASTECTOMY WITH NEEDLE LOCALIZATION Left 01/04/2018   Procedure: LEFT PARTIAL MASTECTOMY STATUS POST NEEDLE LOCALIZATION;  Surgeon: [name], MD;  Location: [facility];  Service: General;  Laterality: Left;  . TEE WITHOUT CARDIOVERSION N/A 10/18/2016   Procedure: TRANSESOPHAGEAL ECHOCARDIOGRAM (TEE);  Surgeon: Arnoldo Lenis, MD;  Location: [facility];  Service: Endoscopy;  Laterality: N/A;  . TOTAL ABDOMINAL HYSTERECTOMY    . TOTAL KNEE ARTHROPLASTY Right   . TUBAL LIGATION       Allergies  Allergen Reactions  . Iodinated Diagnostic Agents Swelling    EYES      Family History  Problem Relation Age of Onset  . Hypertension Mother   . Heart disease Mother   . Kidney disease Father   . Stroke Father   . Diabetes Sister   . Kidney disease Sister   . Heart disease Sister   . Hypertension Sister   . Heart disease Brother   . Heart attack Son    . Hypertension Son   . Cancer Paternal Grandmother        Breast Cancer  . Heart disease Brother   . Colon cancer Neg Hx      Social History Ms. Arbon reports that she quit smoking about 23 years ago. Her smoking use included cigarettes. She has a 12.50 pack-year smoking history. She quit smokeless tobacco use about 45 years ago.  Her smokeless tobacco use included chew. Ms. Debona reports current alcohol use.   Review of Systems CONSTITUTIONAL: No weight loss, fever, chills, weakness or fatigue.  HEENT: Eyes: No visual loss, blurred vision, double vision or yellow sclerae.No hearing loss, sneezing, congestion, runny nose or sore throat.  SKIN: No rash or itching.  CARDIOVASCULAR: per hpi RESPIRATORY: No shortness of breath, cough or sputum.  GASTROINTESTINAL: No anorexia, nausea, vomiting or diarrhea. No abdominal pain or blood.  GENITOURINARY: No burning on urination, no polyuria NEUROLOGICAL: No headache, dizziness, syncope, paralysis, ataxia, numbness or tingling in the extremities. No change in bowel or bladder control.  MUSCULOSKELETAL: No muscle, back pain, joint pain or stiffness.  LYMPHATICS: No enlarged nodes. No history of splenectomy.  PSYCHIATRIC: No history of depression or anxiety.  ENDOCRINOLOGIC: No reports of sweating, cold or heat intolerance. No polyuria or polydipsia.  Marland Kitchen   Physical Examination Today's Vitals   10/15/19 1057  BP: 126/78  Pulse: 62  SpO2: 96%  Weight: 237 lb 3.2 oz (107.6 kg)  Height: 5\' 7"  (1.702 m)   Body mass index is 37.15 kg/m.  Gen: resting comfortably, no acute distress HEENT: no scleral icterus, pupils equal round and reactive, no palptable cervical adenopathy,  CV: RRR, no m/r/g, no jvd Resp: Clear to auscultation bilaterally GI: abdomen is soft, non-tender, non-distended, normal bowel sounds, no hepatosplenomegaly MSK: extremities are warm, no edema.  Skin: warm, no rash Neuro:  no focal deficits Psych: appropriate affect    Diagnostic Studies 08/2016 echo Study Conclusions  - Left ventricle: The cavity size was normal. Wall thickness was increased in a pattern of mild LVH. Systolic function was normal. The estimated ejection fraction was in the range of 50% to 55%. Wall motion was normal; there were no  regional wall motion abnormalities. The study was not technically sufficient to allow evaluation of LV diastolic dysfunction due to atrial fibrillation. - Aortic valve: Mildly calcified annulus. Trileaflet; mildly thickened leaflets. There was mild regurgitation. Valve area (VTI): 2.47 cm^2. Valve area (Vmax): 2.47 cm^2. Valve area (Vmean): 2.43 cm^2. - Mitral valve: The is a MV anular ring present s/p mitral valve repair. There is an increased gradient across the MV. Mean gradient 12 mmHg. - Left atrium: The atrium was severely dilated. - Right ventricle: The cavity size was mildly dilated. Systolic function was mildly to moderately reduced. - Right atrium: The atrium was mildly dilated. - Tricuspid valve: A TV anular ring is present. Mean gradient across the TV of 7 mmHg. - Pulmonary arteries: Systolic pressure was mildly increased. PA peak pressure: 31 mm Hg (S). - Technically adequate study.   10/2016 TEE Study Conclusions  - Left ventricle: The cavity size was normal. Wall thickness was normal. Systolic function was normal. The estimated ejection fraction was in the range of 55% to 60%. - Mitral valve: The posterior leaflet is moderately thickened with severely restricted motion. There is a moderate to severe gradient across the MV of 8 mmHg. There was mild regurgitation. Mean gradient (D): 8 mm Hg. - Left atrium: The atrium was severely dilated. - Right atrium: No evidence of thrombus in the atrial cavity or appendage.   10/2017 echo Study Conclusions  - Left ventricle: The cavity size was normal. Wall thickness was increased in a  pattern of mild LVH. Systolic function was mildly reduced. The estimated ejection fraction was in the range of 45% to 50%. Diffuse hypokinesis. The study is not technically sufficient to allow evaluation of LV diastolic function. - Aortic valve: Mildly calcified annulus. Trileaflet. Mean gradient (S): 3 mm Hg. Valve area (VTI): 2.21 cm^2. - Mitral valve: The findings are consistent with severe stenosis. There was trivial regurgitation. Mean gradient (D): 11 mm Hg. Planimetered valve area: 0.9 cm^2. Valve area by continuity equation (using LVOT flow): 0.68 cm^2. - Left atrium: The atrium was severely dilated. - Right ventricle: The cavity size was mildly dilated. Systolic function was moderately reduced. - Right atrium: The atrium was mildly dilated. Central venous pressure (est): 15 mm Hg. - Atrial septum: No defect or patent foramen ovale was identified. - Tricuspid valve: There was mild regurgitation. - Pulmonary arteries: PA peak pressure: 58 mm Hg (S). - Pericardium, extracardiac: There was no pericardial effusion.  Impressions:  - Mild LVH with LVEF approximately 45-50%. Indeterminate diastolic function. Severe left atrial enlargement. Findings consistent with severe mitral stenosis as detailed above. Moderately reduced right ventricular contraction. Mild tricuspid regurgitation with elevated PASP estimated 58 mmHg.   10/2018 echo Study Conclusions  - Left ventricle: LVEF is depressed at approximately 40 to 45% with hypokinesis worse in the inferior, inferoseptal walls. The cavity size was normal. Wall thickness was normal. Systolic function was mildly to moderately reduced. The estimated ejection fraction was in the range of 40% to 45%. - Mitral valve: MV is thickened with restricted motion. Peak and mean gradients through the valve are 21 and 5 mm Hg MVA by P T1/2 is 1.3 cm2 consistent with severe mitral stenosis Compared to  previous echo in 2018, MVA is smaller. - Left atrium: The atrium was severely dilated. - Right ventricle: Systolic function was mildly reduced. - Right atrium: The atrium was mildly dilated. - Pulmonary arteries: PA peak pressure: 46 mm Hg (S).    Assessment and Plan  1.  Valvular heart disease/SOB/Chronic systolic/diastolic HF -home weights mild variation but overall stable between 228-233 lbs. Diuretic dosing has been limited by prior Cr elevation, remains on torsemide 40mg  daily but will take 60mg  as needed - continue current therapy   2. History of afib - no clear recurrence since prior MAZE procedure, has not been on anticoag - EKG todays shows NSR   3. HTN At goal, continue current meds   F/u 6 months   Arnoldo Lenis, M.D.

## 2019-10-15 NOTE — Patient Instructions (Signed)
Medication Instructions:  Continue all current medications.   Labwork: none  Testing/Procedures: none  Follow-Up: 6 months   Any Other Special Instructions Will Be Listed Below (If Applicable).   If you need a refill on your cardiac medications before your next appointment, please call your pharmacy.  

## 2019-10-15 NOTE — Addendum Note (Signed)
Addended by: Laurine Blazer on: 10/15/2019 01:45 PM   Modules accepted: Orders

## 2019-10-17 ENCOUNTER — Ambulatory Visit (HOSPITAL_COMMUNITY): Payer: Self-pay

## 2019-10-17 ENCOUNTER — Other Ambulatory Visit: Payer: Self-pay | Admitting: General Practice

## 2019-10-17 ENCOUNTER — Other Ambulatory Visit (HOSPITAL_COMMUNITY): Payer: Self-pay | Admitting: General Practice

## 2019-10-17 ENCOUNTER — Encounter (HOSPITAL_COMMUNITY): Payer: Self-pay

## 2019-12-10 ENCOUNTER — Ambulatory Visit (INDEPENDENT_AMBULATORY_CARE_PROVIDER_SITE_OTHER): Payer: Self-pay | Admitting: Nurse Practitioner

## 2019-12-10 ENCOUNTER — Encounter: Payer: Self-pay | Admitting: Nurse Practitioner

## 2019-12-10 ENCOUNTER — Other Ambulatory Visit: Payer: Self-pay

## 2019-12-10 VITALS — BP 96/60 | HR 68 | Temp 96.9°F | Ht 66.0 in | Wt 236.6 lb

## 2019-12-10 DIAGNOSIS — K219 Gastro-esophageal reflux disease without esophagitis: Secondary | ICD-10-CM

## 2019-12-10 DIAGNOSIS — R1310 Dysphagia, unspecified: Secondary | ICD-10-CM

## 2019-12-10 DIAGNOSIS — R1319 Other dysphagia: Secondary | ICD-10-CM

## 2019-12-10 MED ORDER — PANTOPRAZOLE SODIUM 40 MG PO TBEC: 40.0000 mg | DELAYED_RELEASE_TABLET | Freq: Two times a day (BID) | ORAL | 5 refills | Status: AC

## 2019-12-10 NOTE — Progress Notes (Signed)
Referring Provider: Dara Lords, NP Primary Care Physician:  Dara Lords, NP Primary GI:  Dr. Gala Romney  Chief Complaint  Patient presents with  . Dysphagia    Burning in esophagus,vomiting,food getting stuck    HPI:   Toni [name] is a 64 y.o. female who presents upon referral from primary care for dysphagia.  The patient was last seen in our office 05/10/2018 for esophageal dysphagia, GERD, constipation, anemia, elevated alkaline phosphatase.  EGD and colonoscopy in January 2018 with a personal history of colon polyps, diverticulosis next colonoscopy due in January 2025.  EGD performed for dysphagia.  Findings included moderate size hiatal hernia by normal esophagus status post Nissen.  Dilation only helped minimally for dysphagia and still has a fullness in the throat.  In July 2018 she had a barium pill esophagram showed mild prominence of the cricopharyngeus fold and esophagus widely patent.  No evidence of hiatal hernia or reflux.  At her last visit she noted some weight loss on diuretics.  More problems with dysphagia and feels like "phlegm in the throat" and now having pill dysphagia as well as solid food dysphagia.  Takes all her pills at once that she feels like they "piled up" if she takes them one at a time.  Linzess 72 mcg previously effective but with insurance change her co-pay is too expensive ($20).  History of elevated alkaline phosphatase and normocytic anemia.  Query anemia of chronic disease.  Recommended continue Linzess, omeprazole.  Add Zantac at bedtime which can be discontinued if no improvement in dysphagia.  Swallowing and chewing precautions given.  Update labs, follow-up in 6 months.  Completed 05/16/2018 including CBC with mild improvement in anemia (hemoglobin 10.7), remains normocytic.  Alkaline phosphatase essentially normal (mildly elevated at 133 compared to 130 upper limit normal).  Iron and ferritin normal.  Recommend continue to monitor and repeat CBC  in 4 months.  Review of CBC instances in our computer show essentially stable over the past year.  Due to insurance co-pay changes the patient was changed from Stacy to Amitiza 24 mcg 1-2 times daily.  She did not have a follow-up after her last visit per recommendation.  Today she states she's doing ok overall. GERD symptoms started worsening about a month ago. Symptoms are mostly in the evening when lying down with esophageal and throat burning, bitter taste. Infrequent day-time symptoms. Last meal of the day typically about 6:00 pm and lies down for sleep around 11:00 pm-12:00 am. Cannot identify specific food triggers. Also with solid food dysphagia, most recently a couple days ago and felt like "somethingwas stuck in my throat." At times coughs up phlegm and feels like that can get stuck as well. Also with solid pill dysphagia and has to drink copious fluids to get them to go down. Solid food dysphagia about once a day. Occasional regurgitation/vomiting. No odynophagia. Denies any other abdominal pain, N/V, hematochezia, melena, fever, chills, unintentional weight loss. Denies NSAIDs. Only uses Tylenol if needed. Denies URI or flu-like symptoms. Denies loss of sense of taste or smell. Denies chest pain, dyspnea, dizziness, lightheadedness, syncope, near syncope. Denies any other upper or lower GI symptoms.  Protonix has had some GERD improvement, only started this about 5 days ago.  Past Medical History:  Diagnosis Date  . Congestive heart failure, unspecified    Diastolic heart failure  . Degeneration of lumbar or lumbosacral intervertebral disc   . Depression   . Diverticulosis   . Dyspnea  chronic multifactorial large component of deconditioning  . Esophageal reflux   . Gout   . History of kidney stones   . Insomnia    Working third shift  . Mitral stenosis    Secondary to under-sized ring annuloplasty following mitral valve repair  . Mitral valve disorders(424.0)    Mild to  moderate mitral regurgitation  . Mitral valve insufficiency and aortic valve insufficiency   . Morbid obesity ([place])   . Normocytic anemia   . Other chest pain   . Other dyspnea and respiratory abnormality   . Paroxysmal atrial fibrillation ([name])    Stable no recurrence  . Peripheral edema    chronic  . S/P Maze operation for atrial fibrillation 02/27/2015  . S/P mitral valve repair 02/27/2015   "radical repair" including resection of P2 segment of posterior leaflet with 26 mm Edwards Physio II annuloplasty ring  . S/P tricuspid valve repair 02/27/2015   26 mm annuloplasty ring  . Unspecified essential hypertension     Past Surgical History:  Procedure Laterality Date  . APPENDECTOMY    . AXILLARY LYMPH NODE DISSECTION Left 01/04/2018   Procedure: LEFT AXILLARY LYMPH NODE DISSECTION;  Surgeon: [name], MD;  Location: [facility];  Service: General;  Laterality: Left;  . BREAST BIOPSY Left 2009  . CARDIAC VALVE SURGERY  02/27/2015   [facility] Cella. Mitral Valve Repair. Radical Repair with resection of P2 Prolapse, 26 mm Physio II Ring Annuloplasty, Tricuspid Valve Repair (26 mm Ring Annuloplasty, Cox Maze IV Procedure on 02/27/2015  . CHOLECYSTECTOMY    . COLONOSCOPY  2011   Dr. Laural Golden: 59mm polyp from hepatic flexure (tubular adenoma)  . COLONOSCOPY N/A 11/10/2016   Dr. Gala Romney: Diverticulosis. Next colonoscopy 7 years, January 2025  . ESOPHAGOGASTRODUODENOSCOPY  2005    Dr. Gala Romney : normal esophagus s/p dilation. Small hiatal hernia noted. Felt improved after dilation.   . ESOPHAGOGASTRODUODENOSCOPY N/A 11/10/2016   Dr. Gala Romney: Moderate-sized hiatal hernia, esophagus normal status post empiric dilation for history of dysphagia  . HEEL SPUR SURGERY Right   . MALONEY DILATION N/A 11/10/2016   Procedure: Venia Minks DILATION;  Surgeon: Daneil Dolin, MD;  Location: [facility];  Service: Endoscopy;  Laterality: N/A;  . MINIMALLY INVASIVE TRICUSPID VALVE REPAIR  02/27/2015   [place]  . MITRAL VALVE REPAIR  02/27/2015   [place]  . PARTIAL MASTECTOMY WITH NEEDLE LOCALIZATION Left 01/04/2018   Procedure: LEFT PARTIAL MASTECTOMY STATUS POST NEEDLE LOCALIZATION;  Surgeon: [name], MD;  Location: [facility];  Service: General;  Laterality: Left;  . TEE WITHOUT CARDIOVERSION N/A 10/18/2016   Procedure: TRANSESOPHAGEAL ECHOCARDIOGRAM (TEE);  Surgeon: Arnoldo Lenis, MD;  Location: [facility];  Service: Endoscopy;  Laterality: N/A;  . TOTAL ABDOMINAL HYSTERECTOMY    . TOTAL KNEE ARTHROPLASTY Right   . TUBAL LIGATION      Current Outpatient Medications  Medication Sig Dispense Refill  . acetaminophen (TYLENOL) 500 MG tablet Take 1,000 mg by mouth daily as needed for moderate pain or headache.    . albuterol (PROVENTIL HFA;VENTOLIN HFA) 108 (90 Base) MCG/ACT inhaler Inhale 2 puffs into the lungs every 6 (six) hours as needed for wheezing or shortness of breath. 1 Inhaler 3  . allopurinol (ZYLOPRIM) 100 MG tablet Take 100 mg by mouth daily.     Marland Kitchen amLODipine (NORVASC) 5 MG tablet Take 1 tablet (5 mg total) by mouth daily. 90 tablet 1  . aspirin EC 81  MG tablet Take 1 tablet (81 mg total) by mouth daily with breakfast. 90 tablet 3  . FLUoxetine (PROZAC) 20 MG capsule Take 1 capsule (20 mg total) by mouth daily. 30 capsule 2  . gabapentin (NEURONTIN) 300 MG capsule TAKE 1 CAPSULE BY MOUTH IN THE MORNING AND 2 CAPSULES IN THE EVENING (Patient taking differently: Take 300-600 mg by mouth See admin instructions. 300mg  in the morning and 600mg  in the evening) 270 capsule 0  . guaiFENesin (MUCINEX) 600 MG 12 hr tablet Take 600 mg by mouth 2 (two) times daily as needed (congestion).    Marland Kitchen LINZESS 72 MCG capsule TAKE 1 CAPSULE (72 MCG TOTAL) BY MOUTH DAILY BEFORE BREAKFAST. (Patient taking differently: Take 72 mcg by mouth daily before breakfast. ) 30 capsule 11  . losartan (COZAAR) 100 MG tablet Take 1 tablet (100 mg total) by mouth daily.  30 tablet 11  . magnesium gluconate (MAGONATE) 500 MG tablet Take 500 mg by mouth daily.    . metoprolol succinate (TOPROL-XL) 100 MG 24 hr tablet TAKE 1 TABLET BY MOUTH TWICE A DAY 180 tablet 3  . pantoprazole (PROTONIX) 40 MG tablet Take 40 mg by mouth daily.    . potassium chloride SA (K-DUR) 20 MEQ tablet Take 1 tablet (20 mEq total) by mouth daily. 30 tablet 3  . torsemide (DEMADEX) 20 MG tablet Take 40 mg by mouth daily.     Marland Kitchen trolamine salicylate (ASPERCREME) 10 % cream Apply 1 application topically as needed for muscle pain.     No current facility-administered medications for this visit.    Allergies as of 12/10/2019 - Review Complete 12/10/2019  Allergen Reaction Noted  . Iodinated diagnostic agents Swelling 09/01/2011    Family History  Problem Relation Age of Onset  . Hypertension Mother   . Heart disease Mother   . Kidney disease Father   . Stroke Father   . Diabetes Sister   . Kidney disease Sister   . Heart disease Sister   . Hypertension Sister   . Heart disease Brother   . Heart attack Son   . Hypertension Son   . Cancer Paternal Grandmother        Breast Cancer  . Heart disease Brother   . Colon cancer Neg Hx   . Gastric cancer Neg Hx   . Esophageal cancer Neg Hx     Social History   Socioeconomic History  . Marital status: Single    Spouse name: Not on file  . Number of children: Not on file  . Years of education: Not on file  . Highest education level: Not on file  Occupational History  . Not on file  Tobacco Use  . Smoking status: Former Smoker    Packs/day: 0.50    Years: 25.00    Pack years: 12.50    Types: Cigarettes    Quit date: 1997    Years since quitting: 24.1  . Smokeless tobacco: Former Systems developer    Types: Chew    Quit date: 10  . Tobacco comment: dipped tobacco during pregancy in 1975  Substance and Sexual Activity  . Alcohol use: Yes    Comment: a beer occasionally  . Drug use: No  . Sexual activity: Yes    Partners: Male   Other Topics Concern  . Not on file  Social History Narrative   Lives alone or son will stay with her at times. Cooks. Eats well.    Social Determinants of Health  Financial Resource Strain:   . Difficulty of Paying Living Expenses: Not on file  Food Insecurity:   . Worried About Charity fundraiser in the Last Year: Not on file  . Ran Out of Food in the Last Year: Not on file  Transportation Needs:   . Lack of Transportation (Medical): Not on file  . Lack of Transportation (Non-Medical): Not on file  Physical Activity:   . Days of Exercise per Week: Not on file  . Minutes of Exercise per Session: Not on file  Stress:   . Feeling of Stress : Not on file  Social Connections:   . Frequency of Communication with Friends and Family: Not on file  . Frequency of Social Gatherings with Friends and Family: Not on file  . Attends Religious Services: Not on file  . Active Member of Clubs or Organizations: Not on file  . Attends Archivist Meetings: Not on file  . Marital Status: Not on file    Review of Systems: General: Negative for anorexia, weight loss, fever, chills, fatigue, weakness. ENT: Negative for hoarseness. CV: Negative for chest pain, angina, palpitations, peripheral edema.  Respiratory: Negative for dyspnea at rest, cough, sputum, wheezing.  GI: See history of present illness.  Endo: Negative for unusual weight change.  Heme: Negative for bruising or bleeding. Allergy: Negative for rash or hives.   Physical Exam: BP 96/60   Pulse 68   Temp (!) 96.9 F (36.1 C)   Ht 5\' 6"  (1.676 m)   Wt 236 lb 9.6 oz (107.3 kg)   LMP  (LMP Unknown)   BMI 38.19 kg/m  General:   Alert and oriented. Pleasant and cooperative. Well-nourished and well-developed.  Eyes:  Without icterus, sclera clear and conjunctiva pink.  Ears:  Normal auditory acuity. Cardiovascular:  S1, S2 present without murmurs appreciated. Extremities without clubbing or edema. Respiratory:  Clear  to auscultation bilaterally. No wheezes, rales, or rhonchi. No distress.  Gastrointestinal:  +BS, soft, non-tender and non-distended. No HSM noted. No guarding or rebound. No masses appreciated.  Rectal:  Deferred  Musculoskalatal:  Symmetrical without gross deformities. Neurologic:  Alert and oriented x4;  grossly normal neurologically. Psych:  Alert and cooperative. Normal mood and affect. Heme/Lymph/Immune: No excessive bruising noted.    12/10/2019 10:27 AM   Disclaimer: This note was dictated with voice recognition software. Similar sounding words can inadvertently be transcribed and may not be corrected upon review.

## 2019-12-10 NOTE — Assessment & Plan Note (Addendum)
Worsening GERD symptoms as of the past month as per HPI.  Her primary care switch her from omeprazole to Protonix 40 mg once daily.  She has had some improvement.  Currently her symptoms are primarily in the evening when she lies down, despite adequate time between last meal and lying flat.  At this point I will increase her Protonix to twice a day given the degree of symptoms she is having and that her symptoms are mostly in the evening.  Recommend she continue to take her current medications supplied twice a day and call to request refill with the new prescription I will send in.  EGD will also help ensure that there is no other, more specific etiology.  Follow-up in 6 months.

## 2019-12-10 NOTE — Patient Instructions (Addendum)
Your health issues we discussed today were:   GERD (reflux/heartburn): 1. Take your current supply of Protonix 40 mg twice a day.  For sing in the morning and 30 minutes before your last meal today 2. When you run out, I am sending in a new prescription to your pharmacy for this twice a day dosing.  You can call the pharmacy and request that it be filled 3. Continue to avoid any specific triggers you identify 4. Let us know if you have any worsening or severe symptoms  Dysphagia (swallowing difficulties): 1. This is very possibly due to your chronic ongoing GERD symptoms 2. We will schedule an upper endoscopy with possible dilation to help with your symptoms 3. Further recommendations will follow your upper endoscopy 4. It is important that you take your time eating, chew adequately, eat slowly, keep plenty of fluids on hand to help food pass 5. I recommend eating softer foods for now (see instructions below) to help prevent food getting stuck 6. If food gets stuck in when I go forward, will not go backward, and lasts more than 2 to 3 hours she can proceed to the emergency department or call our office  Overall I recommend:  1. Continue taking your other current medications 2. Return for follow-up in 6 months 3. Call us if you have any questions or concerns   ---------------------------------------------------------------  COVID-19 Vaccine Information can be found at: [email or web address].uk For questions related to vaccine distribution or appointments, please email [email or web address] or call [phone].   ---------------------------------------------------------------   At [facility] we value your feedback. You may receive a survey about your visit today. Please share your experience as we strive to create trusting relationships with our patients to provide genuine, compassionate, quality care.  We appreciate  your understanding and patience as we review any laboratory studies, imaging, and other diagnostic tests that are ordered as we care for you. Our office policy is 5 business days for review of these results, and any emergent or urgent results are addressed in a timely manner for your best interest. If you do not hear from our office in 1 week, please contact us.   We also encourage the use of MyChart, which contains your medical information for your review as well. If you are not enrolled in this feature, an access code is on this after visit summary for your convenience. Thank you for allowing [place] to be involved in your care.  It was great to see you today!  I hope you have a great day!!      Dysphagia Eating Plan, Bite Size Food This diet plan is for people with moderate swallowing problems who have transitioned from pureed and minced foods. Bite size foods are soft and cut into small chunks so that they can be swallowed safely. On this eating plan, you may be instructed to drink liquids that are thickened. Work with your health care provider and your diet and nutrition specialist (dietitian) to make sure that you are following the diet safely and getting all the nutrients you need. What are tips for following this plan? General guidelines for foods   You may eat foods that are tender, soft, and moist.  Always test food texture before taking a bite. Poke food with a fork or spoon to make sure it is tender.  Food should be easy to cut and shew. Avoid large pieces of food that require a lot of chewing.  Take small bites. Each bite should be smaller than  your thumb nail (about 52mm by 15 mm).  If you were on pureed and minced food diet plans, you may eat any of the foods included in those diets.  Avoid foods that are very dry, hard, sticky, chewy, coarse, or crunchy.  If instructed by your health care provider, thicken liquids. Follow your health care provider's instructions for what  products to use, how to do this, and to what thickness. ? Your health care provider may recommend using a commercial thickener, rice cereal, or potato flakes. Ask your health care provider to recommend thickeners. ? Thickened liquids are usually a "pudding-like" consistency, or they may be as thick as honey or thick enough to eat with a spoon. Cooking  To moisten foods, you may add liquids while you are blending, mashing, or grinding your foods to the right consistency. These liquids include gravies, sauces, vegetable or fruit juice, milk, half and half, or water.  Strain extra liquid from foods before eating.  Reheat foods slowly to prevent a tough crust from forming.  Prepare foods in advance. Meal planning  Eat a variety of foods to get all the nutrients you need.  Some foods may be tolerated better than others. Work with your dietitian to identify which foods are safest for you to eat.  Follow your meal plan as told by your dietitian. What foods are allowed? Grains Moist breads without nuts or seeds. Biscuits, muffins, pancakes, and waffles that are well-moistened with syrup, jelly, margarine, or butter. Cooked cereals. Moist bread stuffing. Moist rice. Well-moistened cold cereal with small chunks. Well-cooked pasta, noodles, rice, and bread dressing in small pieces and thick sauce. Soft dumplings or spaetzle in small pieces and butter or gravy. Vegetables Soft, well-cooked vegetables in small pieces. Soft-cooked, mashed potatoes. Thickened vegetable juice. Fruits Canned or cooked fruits that are soft or moist and do not have skin or seeds. Fresh, soft bananas. Thickened fruit juices. Meat and other protein foods Tender, moist meats or poultry in small pieces. Moist meatballs or meatloaf. Fish without bones. Eggs or egg substitutes in small pieces. Tofu. Tempeh and meat alternatives in small pieces. Well-cooked, tender beans, peas, baked beans, and other legumes. Dairy Thickened  milk. Cream cheese. Yogurt. Cottage cheese. Sour cream. Small pieces of soft cheese. Fats and oils Butter. Oils. Margarine. Mayonnaise. Gravy. Spreads. Sweets and desserts Soft, smooth, moist desserts. Pudding. Custard. Moist cakes. Jam. Jelly. Honey. Preserves. Ask your health care provider whether you can have frozen desserts. Seasoning and other foods All seasonings and sweeteners. All sauces with small chunks. Prepared tuna, egg, or chicken salad without raw fruits or vegetables. Moist casseroles with small, tender pieces of meat. Soups with tender meat. What foods are not allowed? Grains Coarse or dry cereals. Dry breads. Toast. Crackers. Tough, crusty breads, such as [place] bread and baguettes. Dry pancakes, waffles, and muffins. Sticky rice. Dry bread stuffing. Granola. Popcorn. Chips. Vegetables All raw vegetables. Cooked corn. Rubbery or stiff cooked vegetables. Stringy vegetables, such as celery. Tough, crisp fried potatoes. Potato skins. Fruits Hard, crunchy, stringy, high-pulp, and juicy raw fruits such as apples, pineapple, papaya, and watermelon. Small, round fruits, such as grapes. Dried fruit and fruit leather. Meat and other protein foods Large pieces of meat. Dry, tough meats, such as bacon, sausage, and hot dogs. Chicken, [place], or fish with skin and bones. Crunchy peanut butter. Nuts. Seeds. Nut and seed butters. Dairy Yogurt with nuts, seeds, or large chunks. Large chunks of cheese. Frozen desserts and milk consistency not allowed by  your dietitian. Sweets and desserts Dry cakes. Chewy or dry cookies. Any desserts with nuts, seeds, dry fruits, coconut, pineapple, or anything dry, sticky, or hard. Chewy caramel. Licorice. Taffy-type candies. Ask your health care provider whether you can have frozen desserts. Seasoning and other foods Soups with tough or large chunks of meats, poultry, or vegetables. Corn or clam chowder. Smoothies with large chunks of fruit. Summary  Bite  size foods can be helpful for people with moderate swallowing problems.  On the dysphagia eating plan, you may eat foods that are soft, moist, and cut into pieces smaller than 20mm by 23mm.  You may be instructed to thicken liquids. Follow your health care provider's instructions about how to do this and to what consistency. This information is not intended to replace advice given to you by your health care provider. Make sure you discuss any questions you have with your health care provider. Document Revised: 02/15/2019 Document Reviewed: 02/04/2017 Elsevier Patient Education  [place].

## 2019-12-10 NOTE — Assessment & Plan Note (Signed)
Worsening solid food dysphagia and now with pill dysphagia.  Last EGD in 2018 status post dilation which did have some improvement afterward, per the patient.  Moderate hiatal hernia noted at that time though BPE did not show evidence of a hiatal hernia.  Query possible differentials including longstanding GERD/silent GERD, worsening reflux, hiatal hernia, esophagitis.  Unable to rule out, but less likely, carcinogenic process.  See changes to GERD management as per above.  We will proceed with an EGD with possible dilation.  Proceed with EGD on propofol/MAC with Dr. Gala Romney in near future: the risks, benefits, and alternatives have been discussed with the patient in detail. The patient states understanding and desires to proceed.  The patient is currently on Prozac and Neurontin.  No other anticoagulants, anxiolytics, chronic pain medications, antidepressants, antidiabetics, or iron supplements.  Her last colonoscopy was completed on conscious sedation but with preprocedure Phenergan.  At this point, given the previous need for Phenergan, we will schedule the procedure on propofol/MAC to promote adequate sedation.

## 2019-12-11 NOTE — Progress Notes (Signed)
Cc'ed to pcp °

## 2019-12-18 NOTE — Patient Instructions (Signed)
Toni [name]  12/18/2019     @PREFPERIOPPHARMACY @   Your procedure is scheduled on  2/182021 .  Report to [facility] at  1200  P.M.  Call this number if you have problems the morning of surgery:  [phone]   Remember:  Follow the diet instructions given to you by Dr Roseanne Kaufman office.                      Take these medicines the morning of surgery with A SIP OF WATER  Allopurinol, amlodipine, prozac, gabapentin, losartan, metoprolol, protonix. Use your inhalers before you come.    Do not wear jewelry, make-up or nail polish.  Do not wear lotions, powders, or perfumes. Please wear deodorant and brush your teeth.  Do not shave 48 hours prior to surgery.  Men may shave face and neck.  Do not bring valuables to the hospital.  [facility] is not responsible for any belongings or valuables.  Contacts, dentures or bridgework may not be worn into surgery.  Leave your suitcase in the car.  After surgery it may be brought to your room.  For patients admitted to the hospital, discharge time will be determined by your treatment team.  Patients discharged the day of surgery will not be allowed to drive home.   Name and phone number of your driver:   family Special instructions:  None  Please read over the following fact sheets that you were given. Anesthesia Post-op Instructions and Care and Recovery After Surgery       Upper Endoscopy, Adult, Care After This sheet gives you information about how to care for yourself after your procedure. Your health care provider may also give you more specific instructions. If you have problems or questions, contact your health care provider. What can I expect after the procedure? After the procedure, it is common to have:  A sore throat.  Mild stomach pain or discomfort.  Bloating.  Nausea. Follow these instructions at home:   Follow instructions from your health care provider about what to eat or drink after your  procedure.  Return to your normal activities as told by your health care provider. Ask your health care provider what activities are safe for you.  Take over-the-counter and prescription medicines only as told by your health care provider.  Do not drive for 24 hours if you were given a sedative during your procedure.  Keep all follow-up visits as told by your health care provider. This is important. Contact a health care provider if you have:  A sore throat that lasts longer than one day.  Trouble swallowing. Get help right away if:  You vomit blood or your vomit looks like coffee grounds.  You have: ? A fever. ? Bloody, black, or tarry stools. ? A severe sore throat or you cannot swallow. ? Difficulty breathing. ? Severe pain in your chest or abdomen. Summary  After the procedure, it is common to have a sore throat, mild stomach discomfort, bloating, and nausea.  Do not drive for 24 hours if you were given a sedative during the procedure.  Follow instructions from your health care provider about what to eat or drink after your procedure.  Return to your normal activities as told by your health care provider. This information is not intended to replace advice given to you by your health care provider. Make sure you discuss any questions you have with your  health care provider. Document Revised: 04/18/2018 Document Reviewed: 03/27/2018 Elsevier Patient Education  [place].  Esophageal Dilatation Esophageal dilatation, also called esophageal dilation, is a procedure to widen or open (dilate) a blocked or narrowed part of the esophagus. The esophagus is the part of the body that moves food and liquid from the mouth to the stomach. You may need this procedure if:  You have a buildup of scar tissue in your esophagus that makes it difficult, painful, or impossible to swallow. This can be caused by gastroesophageal reflux disease (GERD).  You have cancer of the  esophagus.  There is a problem with how food moves through your esophagus. In some cases, you may need this procedure repeated at a later time to dilate the esophagus gradually. Tell a health care provider about:  Any allergies you have.  All medicines you are taking, including vitamins, herbs, eye drops, creams, and over-the-counter medicines.  Any problems you or family members have had with anesthetic medicines.  Any blood disorders you have.  Any surgeries you have had.  Any medical conditions you have.  Any antibiotic medicines you are required to take before dental procedures.  Whether you are pregnant or may be pregnant. What are the risks? Generally, this is a safe procedure. However, problems may occur, including:  Bleeding due to a tear in the lining of the esophagus.  A hole (perforation) in the esophagus. What happens before the procedure?  Follow instructions from your health care provider about eating or drinking restrictions.  Ask your health care provider about changing or stopping your regular medicines. This is especially important if you are taking diabetes medicines or blood thinners.  Plan to have someone take you home from the hospital or clinic.  Plan to have a responsible adult care for you for at least 24 hours after you leave the hospital or clinic. This is important. What happens during the procedure?  You may be given a medicine to help you relax (sedative).  A numbing medicine may be sprayed into the back of your throat, or you may gargle the medicine.  Your health care provider may perform the dilatation using various surgical instruments, such as: ? Simple dilators. This instrument is carefully placed in the esophagus to stretch it. ? Guided wire bougies. This involves using an endoscope to insert a wire into the esophagus. A dilator is passed over this wire to enlarge the esophagus. Then the wire is removed. ? Balloon dilators. An endoscope  with a small balloon at the end is inserted into the esophagus. The balloon is inflated to stretch the esophagus and open it up. The procedure may vary among health care providers and hospitals. What happens after the procedure?  Your blood pressure, heart rate, breathing rate, and blood oxygen level will be monitored until the medicines you were given have worn off.  Your throat may feel slightly sore and numb. This will improve slowly over time.  You will not be allowed to eat or drink until your throat is no longer numb.  When you are able to drink, urinate, and sit on the edge of the bed without nausea or dizziness, you may be able to return home. Follow these instructions at home:  Take over-the-counter and prescription medicines only as told by your health care provider.  Do not drive for 24 hours if you were given a sedative during your procedure.  You should have a responsible adult with you for 24 hours after the  procedure.  Follow instructions from your health care provider about any eating or drinking restrictions.  Do not use any products that contain nicotine or tobacco, such as cigarettes and e-cigarettes. If you need help quitting, ask your health care provider.  Keep all follow-up visits as told by your health care provider. This is important. Get help right away if you:  Have a fever.  Have chest pain.  Have pain that is not relieved by medication.  Have trouble breathing.  Have trouble swallowing.  Vomit blood. Summary  Esophageal dilatation, also called esophageal dilation, is a procedure to widen or open (dilate) a blocked or narrowed part of the esophagus.  Plan to have someone take you home from the hospital or clinic.  For this procedure, a numbing medicine may be sprayed into the back of your throat, or you may gargle the medicine.  Do not drive for 24 hours if you were given a sedative during your procedure. This information is not intended to  replace advice given to you by your health care provider. Make sure you discuss any questions you have with your health care provider. Document Revised: 08/22/2019 Document Reviewed: 08/30/2017 Elsevier Patient Education  2020 [place] After These instructions provide you with information about caring for yourself after your procedure. Your health care provider may also give you more specific instructions. Your treatment has been planned according to current medical practices, but problems sometimes occur. Call your health care provider if you have any problems or questions after your procedure. What can I expect after the procedure? After your procedure, you may:  Feel sleepy for several hours.  Feel clumsy and have poor balance for several hours.  Feel forgetful about what happened after the procedure.  Have poor judgment for several hours.  Feel nauseous or vomit.  Have a sore throat if you had a breathing tube during the procedure. Follow these instructions at home: For at least 24 hours after the procedure:      Have a responsible adult stay with you. It is important to have someone help care for you until you are awake and alert.  Rest as needed.  Do not: ? Participate in activities in which you could fall or become injured. ? Drive. ? Use heavy machinery. ? Drink alcohol. ? Take sleeping pills or medicines that cause drowsiness. ? Make important decisions or sign legal documents. ? Take care of children on your own. Eating and drinking  Follow the diet that is recommended by your health care provider.  If you vomit, drink water, juice, or soup when you can drink without vomiting.  Make sure you have little or no nausea before eating solid foods. General instructions  Take over-the-counter and prescription medicines only as told by your health care provider.  If you have sleep apnea, surgery and certain medicines can increase  your risk for breathing problems. Follow instructions from your health care provider about wearing your sleep device: ? Anytime you are sleeping, including during daytime naps. ? While taking prescription pain medicines, sleeping medicines, or medicines that make you drowsy.  If you smoke, do not smoke without supervision.  Keep all follow-up visits as told by your health care provider. This is important. Contact a health care provider if:  You keep feeling nauseous or you keep vomiting.  You feel light-headed.  You develop a rash.  You have a fever. Get help right away if:  You have trouble breathing. Summary  For several hours after your procedure, you may feel sleepy and have poor judgment.  Have a responsible adult stay with you for at least 24 hours or until you are awake and alert. This information is not intended to replace advice given to you by your health care provider. Make sure you discuss any questions you have with your health care provider. Document Revised: 01/23/2018 Document Reviewed: 02/15/2016 Elsevier Patient Education  [place].

## 2019-12-24 ENCOUNTER — Telehealth: Payer: Self-pay | Admitting: Internal Medicine

## 2019-12-24 NOTE — Telephone Encounter (Signed)
PATIENT WANTS TO CANCEL PROCEDURE UNTIL SHE HAS INSURANCE

## 2019-12-24 NOTE — Telephone Encounter (Signed)
Called pt, informed her procedure will be cancelled. Advised her to call office when she is ready to have procedure. Informed endo scheduler.  FYI to EG.

## 2019-12-25 ENCOUNTER — Encounter (HOSPITAL_COMMUNITY): Payer: Self-pay

## 2019-12-25 ENCOUNTER — Encounter (HOSPITAL_COMMUNITY)
Admission: RE | Admit: 2019-12-25 | Discharge: 2019-12-25 | Disposition: A | Discharge status: Home / Self Care | Payer: Medicaid Other | Source: Ambulatory Visit | Attending: Internal Medicine | Admitting: Internal Medicine

## 2019-12-25 ENCOUNTER — Other Ambulatory Visit (HOSPITAL_COMMUNITY)
Admission: RE | Admit: 2019-12-25 | Discharge: 2019-12-25 | Disposition: A | Discharge status: Home / Self Care | Payer: Medicaid Other | Source: Ambulatory Visit | Attending: Internal Medicine | Admitting: Internal Medicine

## 2019-12-25 NOTE — Telephone Encounter (Signed)
Noted  

## 2019-12-27 ENCOUNTER — Encounter (HOSPITAL_COMMUNITY): Admission: RE | Payer: Self-pay | Source: Home / Self Care

## 2019-12-27 ENCOUNTER — Ambulatory Visit (HOSPITAL_COMMUNITY): Admission: RE | Admit: 2019-12-27 | Payer: Medicaid Other | Source: Home / Self Care | Admitting: Internal Medicine

## 2019-12-27 SURGERY — ESOPHAGOGASTRODUODENOSCOPY (EGD) WITH PROPOFOL
Anesthesia: Monitor Anesthesia Care

## 2020-04-14 ENCOUNTER — Encounter: Payer: Self-pay | Admitting: *Deleted

## 2020-04-14 ENCOUNTER — Encounter: Payer: Self-pay | Admitting: Cardiology

## 2020-04-14 ENCOUNTER — Telehealth (INDEPENDENT_AMBULATORY_CARE_PROVIDER_SITE_OTHER): Payer: Self-pay | Admitting: Cardiology

## 2020-04-14 VITALS — BP 116/59 | HR 52 | Resp 20 | Ht 67.0 in | Wt 227.0 lb

## 2020-04-14 DIAGNOSIS — Z9889 Other specified postprocedural states: Secondary | ICD-10-CM

## 2020-04-14 DIAGNOSIS — I5022 Chronic systolic (congestive) heart failure: Secondary | ICD-10-CM

## 2020-04-14 DIAGNOSIS — I480 Paroxysmal atrial fibrillation: Secondary | ICD-10-CM

## 2020-04-14 DIAGNOSIS — I10 Essential (primary) hypertension: Secondary | ICD-10-CM

## 2020-04-14 NOTE — Progress Notes (Signed)
Virtual Visit via Telephone Note   This visit type was conducted due to national recommendations for restrictions regarding the COVID-19 Pandemic (e.g. social distancing) in an effort to limit this patient's exposure and mitigate transmission in our community.  Due to her co-morbid illnesses, this patient is at least at moderate risk for complications without adequate follow up.  This format is felt to be most appropriate for this patient at this time.  The patient did not have access to video technology/had technical difficulties with video requiring transitioning to audio format only (telephone).  All issues noted in this document were discussed and addressed.  No physical exam could be performed with this format.  Please refer to the patient's chart for her  consent to telehealth for [facility].   The patient was identified using 2 identifiers.  Date:  04/14/2020   ID:  Toni [name], DOB 13-Feb-1956, MRN [id number]  Patient Location: Home Provider Location: Office  PCP:  Dara Lords, NP  Cardiologist:  Carlyle Dolly, MD  Electrophysiologist:  None   Evaluation Performed:  Follow-Up Visit  Chief Complaint:  Follow up visit  History of Present Illness:    Toni [name] is a 64 y.o. female seen today for follow up of the following medical problems.      1. Mitral regurgitations/p, now with post repair elevated gradient. - history of previous MV repair at [facility] 02/2015 - Radical Repair with resection of P2 Prolapse, 26 mm Physio II Ring Annuloplasty, Tricuspid Valve Repair (26 mm Ring Annuloplasty, Cox Maze IV Procedure on 02/27/2015 - cath prior to surgery showed no significant CAD - post repair echo 02/2015 showed no MR, mild TR. There are no reported gradients across the valves in the report  02/2015 PFTs normal(on care everywhere) -10/2017echo showed elevated gradients across her repaired MV and TV consistent with post repair stenosis.  - TEE  10/2016 showed restricted motion of posterior MV leaflet with mitral stenosis, mean resting gradient of 8 mmHg. - 10/2017 echo LVEF 45-50%, mean grad MV 11 mmHg.PASP 58. Moderate RV dysfunction 10/2018 echo LVEF 40-45%, LVEF 40-45%, MV mean grad 5, PASP 46  06/2019 echo: LVEF 50%, mod RV dysfunction, moderate AI, severe mitral stenosis mean grad 12 mmHg   - home weights stable around 227 lbs - no recent edema. Compliant with torsemide 40mg  daily, will take extra needed.  - breathing "doing ok", some SOB with high levels of exertion.    2.Chronic systolic/diastolicHF - noted on 29/9242 CXR. BNP up to 2514. - 10/2018 echo LVEF 40-45%, mean MV gradient 5 -previously we increased torsemide to 60mg  daily, later changed to 60mg  alternating with 40mg due to elevation in Cr which improved with change  - admission 06/2019 with volume overload in setting of poor dieatary and medication compliance     - elevation in Cr, we lowered torsemide to 40mg  daily, had been on torsemide alternating 60mg  and 40mg . Cr imrpoved back to prior range - recent labs with pcp - no recent edema     3. Afib -no recurrecne since MAZE procedure, has not been on anticoag - 14 day event monitor 2017 without afib   - no recent palpitations  4. HTN - she is compliant with meds  5. 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   Has completed covid vaccine x 2.    The patient does not have symptoms concerning  for COVID-19 infection (fever, chills, cough, or new shortness of breath).    Past Medical History:  Diagnosis Date  . Congestive heart failure, unspecified    Diastolic heart failure  . Degeneration of lumbar or lumbosacral intervertebral disc   . Depression   . Diverticulosis   . Dyspnea     chronic multifactorial large component of deconditioning  . Esophageal reflux   . Gout   . History of  kidney stones   . Insomnia    Working third shift  . Mitral stenosis    Secondary to under-sized ring annuloplasty following mitral valve repair  . Mitral valve disorders(424.0)    Mild to moderate mitral regurgitation  . Mitral valve insufficiency and aortic valve insufficiency   . Morbid obesity ([place])   . Normocytic anemia   . Other chest pain   . Other dyspnea and respiratory abnormality   . Paroxysmal atrial fibrillation ([name])    Stable no recurrence  . Peripheral edema    chronic  . S/P Maze operation for atrial fibrillation 02/27/2015  . S/P mitral valve repair 02/27/2015   "radical repair" including resection of P2 segment of posterior leaflet with 26 mm Edwards Physio II annuloplasty ring  . S/P tricuspid valve repair 02/27/2015   26 mm annuloplasty ring  . Unspecified essential hypertension    Past Surgical History:  Procedure Laterality Date  . APPENDECTOMY    . AXILLARY LYMPH NODE DISSECTION Left 01/04/2018   Procedure: LEFT AXILLARY LYMPH NODE DISSECTION;  Surgeon: [name], MD;  Location: [facility];  Service: General;  Laterality: Left;  . BREAST BIOPSY Left 2009  . CARDIAC VALVE SURGERY  02/27/2015   [facility]. Mitral Valve Repair. Radical Repair with resection of P2 Prolapse, 26 mm Physio II Ring Annuloplasty, Tricuspid Valve Repair (26 mm Ring Annuloplasty, Cox Maze IV Procedure on 02/27/2015  . CHOLECYSTECTOMY    . COLONOSCOPY  2011   Dr. Laural Golden: 53mm polyp from hepatic flexure (tubular adenoma)  . COLONOSCOPY N/A 11/10/2016   Dr. Gala Romney: Diverticulosis. Next colonoscopy 7 years, January 2025  . ESOPHAGOGASTRODUODENOSCOPY  2005    Dr. Gala Romney : normal esophagus s/p dilation. Small hiatal hernia noted. Felt improved after dilation.   . ESOPHAGOGASTRODUODENOSCOPY N/A 11/10/2016   Dr. Gala Romney: Moderate-sized hiatal hernia, esophagus normal status post empiric dilation for history of dysphagia  . HEEL SPUR SURGERY Right   . MALONEY DILATION N/A 11/10/2016   Procedure:  Venia Minks DILATION;  Surgeon: Daneil Dolin, MD;  Location: [facility];  Service: Endoscopy;  Laterality: N/A;  . MINIMALLY INVASIVE TRICUSPID VALVE REPAIR  02/27/2015   [place]  . MITRAL VALVE REPAIR  02/27/2015   [place]  . PARTIAL MASTECTOMY WITH NEEDLE LOCALIZATION Left 01/04/2018   Procedure: LEFT PARTIAL MASTECTOMY STATUS POST NEEDLE LOCALIZATION;  Surgeon: [name], MD;  Location: [facility];  Service: General;  Laterality: Left;  . TEE WITHOUT CARDIOVERSION N/A 10/18/2016   Procedure: TRANSESOPHAGEAL ECHOCARDIOGRAM (TEE);  Surgeon: Arnoldo Lenis, MD;  Location: [facility];  Service: Endoscopy;  Laterality: N/A;  . TOTAL ABDOMINAL HYSTERECTOMY    . TOTAL KNEE ARTHROPLASTY Right   . TUBAL LIGATION       Current Meds  Medication Sig  . acetaminophen (TYLENOL) 500 MG tablet Take 1,000 mg by mouth daily as needed for moderate pain or headache.  . albuterol (PROVENTIL HFA;VENTOLIN HFA) 108 (90 Base) MCG/ACT inhaler Inhale 2 puffs into the lungs every 6 (six)  hours as needed for wheezing or shortness of breath.  . allopurinol (ZYLOPRIM) 100 MG tablet Take 100 mg by mouth daily.   Marland Kitchen amLODipine (NORVASC) 5 MG tablet Take 1 tablet (5 mg total) by mouth daily.  Marland Kitchen aspirin EC 81 MG tablet Take 1 tablet (81 mg total) by mouth daily with breakfast.  . cholecalciferol (VITAMIN D3) 25 MCG (1000 UNIT) tablet Take 1,000 Units by mouth daily.  Marland Kitchen FLUoxetine (PROZAC) 20 MG capsule Take 1 capsule (20 mg total) by mouth daily.  Marland Kitchen gabapentin (NEURONTIN) 300 MG capsule TAKE 1 CAPSULE BY MOUTH IN THE MORNING AND 2 CAPSULES IN THE EVENING (Patient taking differently: Take 300-600 mg by mouth See admin instructions. 300mg  in the morning and 600mg  in the evening)  . guaiFENesin (MUCINEX) 600 MG 12 hr tablet Take 600 mg by mouth 2 (two) times daily as needed (congestion).  Marland Kitchen LINZESS 72 MCG capsule TAKE 1 CAPSULE (72 MCG TOTAL) BY MOUTH DAILY  BEFORE BREAKFAST. (Patient taking differently: Take 72 mcg by mouth daily before breakfast. )  . losartan (COZAAR) 100 MG tablet Take 1 tablet (100 mg total) by mouth daily.  . magnesium gluconate (MAGONATE) 500 MG tablet Take 500 mg by mouth daily.  . metoprolol succinate (TOPROL-XL) 100 MG 24 hr tablet TAKE 1 TABLET BY MOUTH TWICE A DAY  . pantoprazole (PROTONIX) 40 MG tablet Take 1 tablet (40 mg total) by mouth 2 (two) times daily before a meal.  . potassium chloride SA (K-DUR) 20 MEQ tablet Take 1 tablet (20 mEq total) by mouth daily.  Marland Kitchen torsemide (DEMADEX) 20 MG tablet Take 40 mg by mouth daily.   Marland Kitchen trolamine salicylate (ASPERCREME) 10 % cream Apply 1 application topically as needed for muscle pain.     Allergies:   Iodinated diagnostic agents   Social History   Tobacco Use  . Smoking status: Former Smoker    Packs/day: 0.50    Years: 25.00    Pack years: 12.50    Types: Cigarettes    Quit date: 1997    Years since quitting: 24.4  . Smokeless tobacco: Former Systems developer    Types: Chew    Quit date: 62  . Tobacco comment: dipped tobacco during pregancy in 1975  Substance Use Topics  . Alcohol use: Yes    Comment: a beer occasionally  . Drug use: No     Family Hx: The patient's family history includes Cancer in her paternal grandmother; Diabetes in her sister; Heart attack in her son; Heart disease in her brother, brother, mother, and sister; Hypertension in her mother, sister, and son; Kidney disease in her father and sister; Stroke in her father. There is no history of Colon cancer, Gastric cancer, or Esophageal cancer.  ROS:   Please see the history of present illness.     All other systems reviewed and are negative.   Prior CV studies:   The following studies were reviewed today:  08/2016 echo Study Conclusions  - Left ventricle: The cavity size was normal. Wall thickness was increased in a pattern of mild LVH. Systolic function was normal. The estimated  ejection fraction was in the range of 50% to 55%. Wall motion was normal; there were no regional wall motion abnormalities. The study was not technically sufficient to allow evaluation of LV diastolic dysfunction due to atrial fibrillation. - Aortic valve: Mildly calcified annulus. Trileaflet; mildly thickened leaflets. There was mild regurgitation. Valve area (VTI): 2.47 cm^2. Valve area (Vmax): 2.47 cm^2. Valve area (  Vmean): 2.43 cm^2. - Mitral valve: The is a MV anular ring present s/p mitral valve repair. There is an increased gradient across the MV. Mean gradient 12 mmHg. - Left atrium: The atrium was severely dilated. - Right ventricle: The cavity size was mildly dilated. Systolic function was mildly to moderately reduced. - Right atrium: The atrium was mildly dilated. - Tricuspid valve: A TV anular ring is present. Mean gradient across the TV of 7 mmHg. - Pulmonary arteries: Systolic pressure was mildly increased. PA peak pressure: 31 mm Hg (S). - Technically adequate study.   10/2016 TEE Study Conclusions  - Left ventricle: The cavity size was normal. Wall thickness was normal. Systolic function was normal. The estimated ejection fraction was in the range of 55% to 60%. - Mitral valve: The posterior leaflet is moderately thickened with severely restricted motion. There is a moderate to severe gradient across the MV of 8 mmHg. There was mild regurgitation. Mean gradient (D): 8 mm Hg. - Left atrium: The atrium was severely dilated. - Right atrium: No evidence of thrombus in the atrial cavity or appendage.   10/2017 echo Study Conclusions  - Left ventricle: The cavity size was normal. Wall thickness was increased in a pattern of mild LVH. Systolic function was mildly reduced. The estimated ejection fraction was in the range of 45% to 50%. Diffuse hypokinesis. The study is not technically sufficient to allow evaluation  of LV diastolic function. - Aortic valve: Mildly calcified annulus. Trileaflet. Mean gradient (S): 3 mm Hg. Valve area (VTI): 2.21 cm^2. - Mitral valve: The findings are consistent with severe stenosis. There was trivial regurgitation. Mean gradient (D): 11 mm Hg. Planimetered valve area: 0.9 cm^2. Valve area by continuity equation (using LVOT flow): 0.68 cm^2. - Left atrium: The atrium was severely dilated. - Right ventricle: The cavity size was mildly dilated. Systolic function was moderately reduced. - Right atrium: The atrium was mildly dilated. Central venous pressure (est): 15 mm Hg. - Atrial septum: No defect or patent foramen ovale was identified. - Tricuspid valve: There was mild regurgitation. - Pulmonary arteries: PA peak pressure: 58 mm Hg (S). - Pericardium, extracardiac: There was no pericardial effusion.  Impressions:  - Mild LVH with LVEF approximately 45-50%. Indeterminate diastolic function. Severe left atrial enlargement. Findings consistent with severe mitral stenosis as detailed above. Moderately reduced right ventricular contraction. Mild tricuspid regurgitation with elevated PASP estimated 58 mmHg.   10/2018 echo Study Conclusions  - Left ventricle: LVEF is depressed at approximately 40 to 45% with hypokinesis worse in the inferior, inferoseptal walls. The cavity size was normal. Wall thickness was normal. Systolic function was mildly to moderately reduced. The estimated ejection fraction was in the range of 40% to 45%. - Mitral valve: MV is thickened with restricted motion. Peak and mean gradients through the valve are 21 and 5 mm Hg MVA by P T1/2 is 1.3 cm2 consistent with severe mitral stenosis Compared to previous echo in 2018, MVA is smaller. - Left atrium: The atrium was severely dilated. - Right ventricle: Systolic function was mildly reduced. - Right atrium: The atrium was mildly dilated. - Pulmonary  arteries: PA peak pressure: 46 mm Hg (S).   Labs/Other Tests and Data Reviewed:    EKG:  No ECG reviewed.  Recent Labs: 06/18/2019: B Natriuretic Peptide 1,113.0; Hemoglobin 10.4; Platelets 154; TSH 0.863 08/13/2019: Magnesium 2.3 09/05/2019: BUN 31; Creatinine, Ser 1.33; Potassium 4.0; Sodium 137   Recent Lipid Panel Lab Results  Component Value Date/Time  CHOL 119 09/16/2017 11:48 AM   TRIG 70 09/16/2017 11:48 AM   HDL 37 (L) 09/16/2017 11:48 AM   CHOLHDL 3.2 09/16/2017 11:48 AM   LDLCALC 67 09/16/2017 11:48 AM    Wt Readings from Last 3 Encounters:  04/14/20 227 lb (103 kg)  12/10/19 236 lb 9.6 oz (107.3 kg)  10/15/19 237 lb 3.2 oz (107.6 kg)     Objective:    Vital Signs:  BP (!) 116/59   Pulse (!) 52   Resp 20   Ht 5\' 7"  (1.702 m)   Wt 227 lb (103 kg)   LMP  (LMP Unknown)   BMI 35.55 kg/m    Normal affect. Normal speech pattern and tone. Comfortable, no apparent distress. No audible signs of sob or wheezing.   ASSESSMENT & PLAN:    1. Valvular heart disease/SOB/Chronic systolic/diastolicHF - Diuretic dosing has been limited by prior Cr elevation, remains on torsemide 40mg  daily but will take 60mg  as needed - weights stable, on lower range of where they have been - continue current diuretic therapy   2. History of afib - no clear recurrence since prior MAZE procedure, has not been on anticoag - no symptoms, continue to monitor  3. HTN - she is at goal, continue current meds  COVID-19 Education: The signs and symptoms of COVID-19 were discussed with the patient and how to seek care for testing (follow up with PCP or arrange E-visit).  The importance of social distancing was discussed today.  Time:   Today, I have spent 20 minutes with the patient with telehealth technology discussing the above problems.     Medication Adjustments/Labs and Tests Ordered: Current medicines are reviewed at length with the patient today.  Concerns regarding medicines  are outlined above.   Tests Ordered: No orders of the defined types were placed in this encounter.   Medication Changes: No orders of the defined types were placed in this encounter.   Follow Up:  In Person in 6 month(s)  Signed, Carlyle Dolly, MD  04/14/2020 10:12 AM          Arnoldo Lenis, M.D.

## 2020-04-14 NOTE — Patient Instructions (Signed)

## 2020-05-21 ENCOUNTER — Other Ambulatory Visit: Payer: Self-pay | Admitting: Cardiology

## 2020-06-09 NOTE — Progress Notes (Signed)
Referring Provider: Dara Lords, NP Primary Care Physician:  Dara Lords, NP Primary GI:  Dr. Gala Romney  Chief Complaint  Patient presents with  . Gastroesophageal Reflux    f/u. occas issue  . Dysphagia    f/u.    HPI:   Toni [name] is a 64 y.o. female who presents for follow-up on GERD.  The patient was last seen in our office 12/10/2019 for esophageal dysphagia and GERD.  Previous EGD and colonoscopy in January 2018 with personal history of colon polyps, diverticulosis, next colonoscopy due in January 2025.  EGD performed for dysphagia with moderate size hiatal hernia and normal esophagus status post Nissen.  Dilation only helped minimally for dysphagia and still has fullness in the throat.  July 2018 she had a barium pill esophagram showed mild prominence of the cricopharyngeus fold and the esophagus was widely patent.  No evidence of hiatal hernia or reflux.  At her last visit GERD symptoms started worsening a month prior mostly in the evening when lying down with esophageal and throat burning, bitter taste.  Notes infrequent daytime symptoms.  Last meal the day typically 6 PM and does not lie down for sleep until 11 PM.  Also with solid food dysphagia most recently couple days prior to felt like "something was stuck in my throat" and at times coughs up phlegm which she feels gets stuck as well.  Also noted pill dysphagia and has to drink copious fluids to get him to go down.  Her symptoms occur about once a day, occasional regurgitation/vomiting.  No odynophagia.  No other overt GI complaints.  Denies NSAIDs.  Protonix has had some GERD improvement but only started about 5 days prior.  Recommended increase Protonix to 40 mg twice a day, we will send a new prescription when she runs out of her current supply, avoid any specific triggers, schedule EGD with possible dilation, chewing and swallowing precautions were reviewed, stick to softer foods for now, ER precautions given.   Follow-up in 6 months.  The patient's EGD was scheduled for 12/27/2019 but she called our office on 12/24/2019 to cancel until she has insurance.  Today she states she is doing okay overall. GERD symptoms doing pretty good but occasionally has a lot of phlegm on the right side. Does not have allergies that she's aware of. Takes Mucinex which will help. Has talked with primary care about it, previously recommended Mucinex. She still has to drink lots of water with her pills because of the phlegm. Minimal to no GERD symptoms, currently taking Protonix 40 mg daily with occasional prn dose in the afternoon/evening. Linzess 72 mcg daily controls constipation well. Denies abdominal pain, N/V, hematochezia, melena, fever, chills, unintentional weight loss. Denies URI or flu-like symptoms. Denies loss of sense of taste or smell. The patient has received COVID-19 vaccination(s). Denies chest pain, dyspnea, dizziness, lightheadedness, syncope, near syncope. Denies any other upper or lower GI symptoms.  Past Medical History:  Diagnosis Date  . Congestive heart failure, unspecified    Diastolic heart failure  . Degeneration of lumbar or lumbosacral intervertebral disc   . Depression   . Diverticulosis   . Dyspnea     chronic multifactorial large component of deconditioning  . Esophageal reflux   . Gout   . History of kidney stones   . Insomnia    Working third shift  . Mitral stenosis    Secondary to under-sized ring annuloplasty following mitral valve repair  . Mitral  valve disorders(424.0)    Mild to moderate mitral regurgitation  . Mitral valve insufficiency and aortic valve insufficiency   . Morbid obesity ([place])   . Normocytic anemia   . Other chest pain   . Other dyspnea and respiratory abnormality   . Paroxysmal atrial fibrillation ([name])    Stable no recurrence  . Peripheral edema    chronic  . S/P Maze operation for atrial fibrillation 02/27/2015  . S/P mitral valve repair 02/27/2015    "radical repair" including resection of P2 segment of posterior leaflet with 26 mm Edwards Physio II annuloplasty ring  . S/P tricuspid valve repair 02/27/2015   26 mm annuloplasty ring  . Unspecified essential hypertension     Past Surgical History:  Procedure Laterality Date  . APPENDECTOMY    . AXILLARY LYMPH NODE DISSECTION Left 01/04/2018   Procedure: LEFT AXILLARY LYMPH NODE DISSECTION;  Surgeon: [name], MD;  Location: [facility];  Service: General;  Laterality: Left;  . BREAST BIOPSY Left 2009  . CARDIAC VALVE SURGERY  02/27/2015   [facility] Cella. Mitral Valve Repair. Radical Repair with resection of P2 Prolapse, 26 mm Physio II Ring Annuloplasty, Tricuspid Valve Repair (26 mm Ring Annuloplasty, [facility] Maze IV Procedure on 02/27/2015  . CHOLECYSTECTOMY    . COLONOSCOPY  2011   Dr. Laural Golden: 17mm polyp from hepatic flexure (tubular adenoma)  . COLONOSCOPY N/A 11/10/2016   Dr. Gala Romney: Diverticulosis. Next colonoscopy 7 years, January 2025  . ESOPHAGOGASTRODUODENOSCOPY  2005    Dr. Gala Romney : normal esophagus s/p dilation. Small hiatal hernia noted. Felt improved after dilation.   . ESOPHAGOGASTRODUODENOSCOPY N/A 11/10/2016   Dr. Gala Romney: Moderate-sized hiatal hernia, esophagus normal status post empiric dilation for history of dysphagia  . HEEL SPUR SURGERY Right   . MALONEY DILATION N/A 11/10/2016   Procedure: Venia Minks DILATION;  Surgeon: Daneil Dolin, MD;  Location: [facility];  Service: Endoscopy;  Laterality: N/A;  . MINIMALLY INVASIVE TRICUSPID VALVE REPAIR  02/27/2015   [place]  . MITRAL VALVE REPAIR  02/27/2015   [place]  . PARTIAL MASTECTOMY WITH NEEDLE LOCALIZATION Left 01/04/2018   Procedure: LEFT PARTIAL MASTECTOMY STATUS POST NEEDLE LOCALIZATION;  Surgeon: [name], MD;  Location: [facility];  Service: General;  Laterality: Left;  . TEE WITHOUT CARDIOVERSION N/A 10/18/2016   Procedure: TRANSESOPHAGEAL ECHOCARDIOGRAM (TEE);   Surgeon: Arnoldo Lenis, MD;  Location: [facility];  Service: Endoscopy;  Laterality: N/A;  . TOTAL ABDOMINAL HYSTERECTOMY    . TOTAL KNEE ARTHROPLASTY Right   . TUBAL LIGATION      Current Outpatient Medications  Medication Sig Dispense Refill  . acetaminophen (TYLENOL) 500 MG tablet Take 1,000 mg by mouth daily as needed for moderate pain or headache.    . albuterol (PROVENTIL HFA;VENTOLIN HFA) 108 (90 Base) MCG/ACT inhaler Inhale 2 puffs into the lungs every 6 (six) hours as needed for wheezing or shortness of breath. 1 Inhaler 3  . allopurinol (ZYLOPRIM) 100 MG tablet Take 300 mg by mouth daily.     Marland Kitchen amLODipine (NORVASC) 5 MG tablet Take 1 tablet (5 mg total) by mouth daily. 90 tablet 1  . aspirin EC 81 MG tablet Take 1 tablet (81 mg total) by mouth daily with breakfast. 90 tablet 3  . cholecalciferol (VITAMIN D3) 25 MCG (1000 UNIT) tablet Take 1,000 Units by mouth daily.    Marland Kitchen FLUoxetine (PROZAC) 20 MG capsule Take 1 capsule (20 mg total) by mouth daily.  30 capsule 2  . gabapentin (NEURONTIN) 300 MG capsule TAKE 1 CAPSULE BY MOUTH IN THE MORNING AND 2 CAPSULES IN THE EVENING (Patient taking differently: Take 300-600 mg by mouth See admin instructions. 300mg  in the morning and 600mg  in the evening) 270 capsule 0  . guaiFENesin (MUCINEX) 600 MG 12 hr tablet Take 600 mg by mouth 2 (two) times daily as needed (congestion).    Marland Kitchen LINZESS 72 MCG capsule TAKE 1 CAPSULE (72 MCG TOTAL) BY MOUTH DAILY BEFORE BREAKFAST. (Patient taking differently: Take 72 mcg by mouth daily before breakfast. ) 30 capsule 11  . losartan (COZAAR) 100 MG tablet Take 1 tablet (100 mg total) by mouth daily. 30 tablet 11  . magnesium gluconate (MAGONATE) 500 MG tablet Take 500 mg by mouth daily.    . metoprolol succinate (TOPROL-XL) 100 MG 24 hr tablet TAKE 1 TABLET BY MOUTH TWICE A DAY 180 tablet 3  . pantoprazole (PROTONIX) 40 MG tablet Take 1 tablet (40 mg total) by mouth 2 (two) times daily before a meal. 60  tablet 5  . potassium chloride SA (K-DUR) 20 MEQ tablet Take 1 tablet (20 mEq total) by mouth daily. 30 tablet 3  . torsemide (DEMADEX) 20 MG tablet TAKE 3 TABLETS ALTERNATING DAYS WITH 2 TABLETS. IF WEIGHT GOES ABOVE 230 LBS THEN TAKE 3 TABLETS DAILY UNTIL BACK DOWN AND THEN RESUME ALTERNATING DAYS WITH 3 TABLETS ONE DAY AND 2 TABLETS THE NEXT 230 tablet 1  . trolamine salicylate (ASPERCREME) 10 % cream Apply 1 application topically as needed for muscle pain.     No current facility-administered medications for this visit.    Allergies as of 06/10/2020 - Review Complete 06/10/2020  Allergen Reaction Noted  . Iodinated diagnostic agents Swelling 09/01/2011    Family History  Problem Relation Age of Onset  . Hypertension Mother   . Heart disease Mother   . Kidney disease Father   . Stroke Father   . Diabetes Sister   . Kidney disease Sister   . Heart disease Sister   . Hypertension Sister   . Heart disease Brother   . Heart attack Son   . Hypertension Son   . Cancer Paternal Grandmother        Breast Cancer  . Heart disease Brother   . Colon cancer Neg Hx   . Gastric cancer Neg Hx   . Esophageal cancer Neg Hx     Social History   Socioeconomic History  . Marital status: Single    Spouse name: Not on file  . Number of children: Not on file  . Years of education: Not on file  . Highest education level: Not on file  Occupational History  . Not on file  Tobacco Use  . Smoking status: Former Smoker    Packs/day: 0.50    Years: 25.00    Pack years: 12.50    Types: Cigarettes    Quit date: 1997    Years since quitting: 24.6  . Smokeless tobacco: Former Systems developer    Types: Chew    Quit date: 25  . Tobacco comment: dipped tobacco during pregancy in 1975  Vaping Use  . Vaping Use: Never used  Substance and Sexual Activity  . Alcohol use: Yes    Comment: a beer occasionally  . Drug use: No  . Sexual activity: Yes    Partners: Male  Other Topics Concern  . Not on file   Social History Narrative   Lives alone or son  will stay with her at times. Cooks. Eats well.    Social Determinants of Health   Financial Resource Strain:   . Difficulty of Paying Living Expenses:   Food Insecurity:   . Worried About Charity fundraiser in the Last Year:   . Arboriculturist in the Last Year:   Transportation Needs:   . Film/video editor (Medical):   Marland Kitchen Lack of Transportation (Non-Medical):   Physical Activity:   . Days of Exercise per Week:   . Minutes of Exercise per Session:   Stress:   . Feeling of Stress :   Social Connections:   . Frequency of Communication with Friends and Family:   . Frequency of Social Gatherings with Friends and Family:   . Attends Religious Services:   . Active Member of Clubs or Organizations:   . Attends Archivist Meetings:   Marland Kitchen Marital Status:     Subjective: Review of Systems  Constitutional: Negative for chills, fever, malaise/fatigue and weight loss.  HENT: Negative for congestion and sore throat.   Respiratory: Negative for cough and shortness of breath.   Cardiovascular: Negative for chest pain and palpitations.  Gastrointestinal: Positive for heartburn (If she forgets to take her medication). Negative for abdominal pain, blood in stool, diarrhea, melena, nausea and vomiting.  Musculoskeletal: Negative for joint pain and myalgias.  Skin: Negative for rash.  Neurological: Negative for dizziness and weakness.  Endo/Heme/Allergies: Does not bruise/bleed easily.  Psychiatric/Behavioral: Negative for depression. The patient is not nervous/anxious.   All other systems reviewed and are negative.    Objective: BP 114/78   Pulse 65   Temp 97.8 F (36.6 C)   Ht 5\' 7"  (1.702 m)   Wt 228 lb 12.8 oz (103.8 kg)   LMP  (LMP Unknown)   BMI 35.84 kg/m  Physical Exam Vitals and nursing note reviewed.  Constitutional:      General: She is not in acute distress.    Appearance: Normal appearance. She is  well-developed. She is obese. She is not ill-appearing, toxic-appearing or diaphoretic.  HENT:     Head: Normocephalic and atraumatic.     Nose: No congestion or rhinorrhea.  Eyes:     General: No scleral icterus. Cardiovascular:     Rate and Rhythm: Normal rate and regular rhythm.     Heart sounds: Normal heart sounds.  Pulmonary:     Effort: Pulmonary effort is normal. No respiratory distress.     Breath sounds: Normal breath sounds.  Abdominal:     General: Bowel sounds are normal.     Palpations: Abdomen is soft. There is no hepatomegaly, splenomegaly or mass.     Tenderness: There is no abdominal tenderness. There is no guarding or rebound.     Hernia: No hernia is present.  Skin:    General: Skin is warm and dry.     Coloration: Skin is not jaundiced.     Findings: No rash.  Neurological:     General: No focal deficit present.     Mental Status: She is alert and oriented to person, place, and time.  Psychiatric:        Attention and Perception: Attention normal.        Mood and Affect: Mood normal.        Speech: Speech normal.        Behavior: Behavior normal.        Thought Content: Thought content normal.  Cognition and Memory: Cognition and memory normal.       06/10/2020 9:41 AM   Disclaimer: This note was dictated with voice recognition software. Similar sounding words can inadvertently be transcribed and may not be corrected upon review.

## 2020-06-10 ENCOUNTER — Other Ambulatory Visit: Payer: Self-pay

## 2020-06-10 ENCOUNTER — Encounter: Payer: Self-pay | Admitting: Nurse Practitioner

## 2020-06-10 ENCOUNTER — Ambulatory Visit (INDEPENDENT_AMBULATORY_CARE_PROVIDER_SITE_OTHER): Payer: Medicare HMO | Admitting: Nurse Practitioner

## 2020-06-10 VITALS — BP 114/78 | HR 65 | Temp 97.8°F | Ht 67.0 in | Wt 228.8 lb

## 2020-06-10 DIAGNOSIS — R1319 Other dysphagia: Secondary | ICD-10-CM

## 2020-06-10 DIAGNOSIS — K5900 Constipation, unspecified: Secondary | ICD-10-CM | POA: Diagnosis not present

## 2020-06-10 DIAGNOSIS — K219 Gastro-esophageal reflux disease without esophagitis: Secondary | ICD-10-CM

## 2020-06-10 DIAGNOSIS — R1310 Dysphagia, unspecified: Secondary | ICD-10-CM

## 2020-06-10 NOTE — Assessment & Plan Note (Signed)
GERD symptoms currently well managed on Protonix 40 mg 1-2 times a day.  Most days she does well with once a day, occasionally will take a second dose in the afternoon as needed.  Recommend she continue her current medications and follow-up in 1 year, call for any worsening or severe symptoms before then.

## 2020-06-10 NOTE — Assessment & Plan Note (Signed)
Currently doing well with her dysphagia symptoms, only occasional pill dysphagia if she has significant phlegm in her throat.  This is over, with copious fluids with pills.  Recommend she continue this, continue PPI as per above.  Follow-up in 1 year.  Call for any worsening or severe symptoms.

## 2020-06-10 NOTE — Assessment & Plan Note (Signed)
Currently well managed on Linzess 72 mcg daily.  Recommend she continue her current medications and follow-up in 1 year.  Call for any worsening symptoms prior to then.

## 2020-06-10 NOTE — Patient Instructions (Signed)
Your health issues we discussed today were:   GERD (reflux/heartburn) with dysphagia (swallowing difficulties): 1. Continue taking Protonix as you have been 2. Call us if you have any worsening or severe symptoms  Constipation: 1. Continue taking Linzess 72 mcg daily 2. Call us if you have any worsening or severe symptoms  Overall I recommend:  1. Continue your other medications otherwise 2. Return for follow-up in 1 year 3. Call us if you have any questions or concerns   ---------------------------------------------------------------  I am glad you have gotten your COVID-19 vaccination!  Even though you are fully vaccinated you should continue to follow CDC and state/local guidelines.  ---------------------------------------------------------------   At [facility] we value your feedback. You may receive a survey about your visit today. Please share your experience as we strive to create trusting relationships with our patients to provide genuine, compassionate, quality care.  We appreciate your understanding and patience as we review any laboratory studies, imaging, and other diagnostic tests that are ordered as we care for you. Our office policy is 5 business days for review of these results, and any emergent or urgent results are addressed in a timely manner for your best interest. If you do not hear from our office in 1 week, please contact us.   We also encourage the use of MyChart, which contains your medical information for your review as well. If you are not enrolled in this feature, an access code is on this after visit summary for your convenience. Thank you for allowing [place] to be involved in your care.  It was great to see you today!  I hope you have a great Summer!!

## 2020-06-25 DIAGNOSIS — I509 Heart failure, unspecified: Secondary | ICD-10-CM | POA: Diagnosis not present

## 2020-06-25 DIAGNOSIS — I10 Essential (primary) hypertension: Secondary | ICD-10-CM | POA: Diagnosis not present

## 2020-06-27 ENCOUNTER — Other Ambulatory Visit: Payer: Self-pay | Admitting: Cardiology

## 2020-07-10 ENCOUNTER — Other Ambulatory Visit: Payer: Self-pay | Admitting: Cardiology

## 2020-07-25 ENCOUNTER — Encounter (HOSPITAL_COMMUNITY): Payer: Self-pay | Admitting: *Deleted

## 2020-07-25 ENCOUNTER — Other Ambulatory Visit: Payer: Self-pay

## 2020-07-25 ENCOUNTER — Emergency Department (HOSPITAL_COMMUNITY): Payer: Medicare HMO

## 2020-07-25 ENCOUNTER — Emergency Department (HOSPITAL_COMMUNITY)
Admission: EM | Admit: 2020-07-25 | Discharge: 2020-07-25 | Disposition: A | Discharge status: Home / Self Care | Payer: Medicare HMO | Attending: Emergency Medicine | Admitting: Emergency Medicine

## 2020-07-25 DIAGNOSIS — S0990XA Unspecified injury of head, initial encounter: Secondary | ICD-10-CM | POA: Diagnosis not present

## 2020-07-25 DIAGNOSIS — Z79899 Other long term (current) drug therapy: Secondary | ICD-10-CM | POA: Insufficient documentation

## 2020-07-25 DIAGNOSIS — Z7951 Long term (current) use of inhaled steroids: Secondary | ICD-10-CM | POA: Diagnosis not present

## 2020-07-25 DIAGNOSIS — Z87891 Personal history of nicotine dependence: Secondary | ICD-10-CM | POA: Diagnosis not present

## 2020-07-25 DIAGNOSIS — I5043 Acute on chronic combined systolic (congestive) and diastolic (congestive) heart failure: Secondary | ICD-10-CM | POA: Diagnosis not present

## 2020-07-25 DIAGNOSIS — I110 Hypertensive heart disease with heart failure: Secondary | ICD-10-CM | POA: Diagnosis not present

## 2020-07-25 DIAGNOSIS — R519 Headache, unspecified: Secondary | ICD-10-CM | POA: Diagnosis not present

## 2020-07-25 DIAGNOSIS — Z96651 Presence of right artificial knee joint: Secondary | ICD-10-CM | POA: Insufficient documentation

## 2020-07-25 DIAGNOSIS — I6389 Other cerebral infarction: Secondary | ICD-10-CM | POA: Diagnosis not present

## 2020-07-25 DIAGNOSIS — Z7982 Long term (current) use of aspirin: Secondary | ICD-10-CM | POA: Insufficient documentation

## 2020-07-25 DIAGNOSIS — R9082 White matter disease, unspecified: Secondary | ICD-10-CM | POA: Diagnosis not present

## 2020-07-25 LAB — BASIC METABOLIC PANEL
Anion gap: 7 (ref 5–15)
BUN: 24 mg/dL — ABNORMAL HIGH (ref 8–23)
CO2: 23 mmol/L (ref 22–32)
Calcium: 9.3 mg/dL (ref 8.9–10.3)
Chloride: 108 mmol/L (ref 98–111)
Creatinine, Ser: 1.18 mg/dL — ABNORMAL HIGH (ref 0.44–1.00)
GFR calc Af Amer: 56 mL/min — ABNORMAL LOW (ref >60)
GFR calc non Af Amer: 48 mL/min — ABNORMAL LOW (ref >60)
Glucose, Bld: 83 mg/dL (ref 70–99)
Potassium: 4 mmol/L (ref 3.5–5.1)
Sodium: 138 mmol/L (ref 135–145)

## 2020-07-25 LAB — CBC WITH DIFFERENTIAL/PLATELET
Abs Immature Granulocytes: 0.01 10*3/uL (ref 0.00–0.07)
Basophils Absolute: 0 10*3/uL (ref 0.0–0.1)
Basophils Relative: 1 %
Eosinophils Absolute: 0.1 10*3/uL (ref 0.0–0.5)
Eosinophils Relative: 2 %
HCT: 36.4 % (ref 36.0–46.0)
Hemoglobin: 11.4 g/dL — ABNORMAL LOW (ref 12.0–15.0)
Immature Granulocytes: 0 %
Lymphocytes Relative: 24 %
Lymphs Abs: 1.3 10*3/uL (ref 0.7–4.0)
MCH: 31.4 pg (ref 26.0–34.0)
MCHC: 31.3 g/dL (ref 30.0–36.0)
MCV: 100.3 fL — ABNORMAL HIGH (ref 80.0–100.0)
Monocytes Absolute: 0.4 10*3/uL (ref 0.1–1.0)
Monocytes Relative: 8 %
Neutro Abs: 3.5 10*3/uL (ref 1.7–7.7)
Neutrophils Relative %: 65 %
Platelets: 124 10*3/uL — ABNORMAL LOW (ref 150–400)
RBC: 3.63 MIL/uL — ABNORMAL LOW (ref 3.87–5.11)
RDW: 15 % (ref 11.5–15.5)
WBC: 5.4 10*3/uL (ref 4.0–10.5)
nRBC: 0 % (ref 0.0–0.2)

## 2020-07-25 MED ORDER — ACETAMINOPHEN 325 MG PO TABS
650.0000 mg | ORAL_TABLET | Freq: Once | ORAL | Status: AC
  Administered 2020-07-25: 650 mg via ORAL
  Filled 2020-07-25: qty 2

## 2020-07-25 MED ORDER — PROCHLORPERAZINE EDISYLATE 10 MG/2ML IJ SOLN
10.0000 mg | Freq: Once | INTRAMUSCULAR | Status: AC
  Administered 2020-07-25: 10 mg via INTRAMUSCULAR
  Filled 2020-07-25: qty 2

## 2020-07-25 MED ORDER — PROCHLORPERAZINE EDISYLATE 10 MG/2ML IJ SOLN: 10.0000 mg | Freq: Once | INTRAMUSCULAR | Status: DC

## 2020-07-25 NOTE — ED Triage Notes (Signed)
Pt c/o headache that started this am when she woke up; pt describes it as a throbbing pain and worse headache she has had; pt also c/o some right side pain for the last couple of days

## 2020-07-25 NOTE — Discharge Instructions (Addendum)
You were seen here for a headache.  Lab work and imaging all look reassuring.  I recommend over-the-counter pain medications like ibuprofen and or Tylenol every 6 as needed.  I also recommend when these headaches come on to isolate yourself and stay in a dark room away from loud noises and give your brain a mental break.  Please stay hydrated as this may have caused your headache.  I want you to follow-up with your primary care provider for further evaluation management of your headaches if they persist or get worse.  I want to come back to emergency department if you develop severe headaches, change in vision, numbness or weakness in your extremities, slurring of your words, become dizzy or lose balance, chest pain, shortness of breath, severe abdominal pain, uncontrolled nausea, vomiting or diarrhea as these symptoms require further evaluation management.

## 2020-07-25 NOTE — ED Provider Notes (Signed)
[facility] EMERGENCY DEPARTMENT Provider Note   CSN: [id number] Arrival date & time: 07/25/20  1049     History Chief Complaint  Patient presents with   Headache    Toni [name] is a 64 y.o. female.  HPI   Patient with significant medical history of congestive heart failure, diverticulosis, gout, mitral valve stenosis, proximal A. fib, hypertension presents emergency department with chief complaint of headache x1 day.  Patient states when she woke up this morning she had a severe headache on the top of her head, she states its a throbbing sensation which is constant.  She has no associated symptoms like change in vision, slurring of her speech, loss of balance, paresthesias or weakness in upper or lower extremities, nausea or vomiting.  She denies recent falls, hitting her head, not on anticoags.  She states she has had migraines in the past but never has had the top of her head hurt like this before.  She is not taking any medications for this pain.  She denies any recent sick contacts, is Covid vaccinated, no recent travels.  Patient denies fever, chills, shortness breath, chest pain, abdominal pain, nausea, vomiting, diarrhea, pedal edema.  Past Medical History:  Diagnosis Date   Congestive heart failure, unspecified    Diastolic heart failure   Degeneration of lumbar or lumbosacral intervertebral disc    Depression    Diverticulosis    Dyspnea     chronic multifactorial large component of deconditioning   Esophageal reflux    Gout    History of kidney stones    Insomnia    Working third shift   Mitral stenosis    Secondary to under-sized ring annuloplasty following mitral valve repair   Mitral valve disorders(424.0)    Mild to moderate mitral regurgitation   Mitral valve insufficiency and aortic valve insufficiency    Morbid obesity ([name])    Normocytic anemia    Other chest pain    Other dyspnea and respiratory abnormality    Paroxysmal atrial  fibrillation ([name])    Stable no recurrence   Peripheral edema    chronic   S/P Maze operation for atrial fibrillation 02/27/2015   S/P mitral valve repair 02/27/2015   "radical repair" including resection of P2 segment of posterior leaflet with 26 mm Edwards Physio II annuloplasty ring   S/P tricuspid valve repair 02/27/2015   26 mm annuloplasty ring   Unspecified essential hypertension     Patient Active Problem List   Diagnosis Date Noted   Acute on chronic combined systolic and diastolic heart failure ([place]) 06/18/2019   Anxiety with depression 06/18/2019   Prolonged QT interval 06/18/2019   Elevated alkaline phosphatase level 05/10/2018   Breast mass, left 01/04/2018   Left breast mass    Adenopathy    Upper airway cough syndrome 11/24/2017   Esophageal reflux 11/10/2017   Neuropathic pain 09/16/2017   Anemia of unknown etiology 08/17/2017   Anxiety 08/17/2017   Globus sensation 05/10/2017   PNA (pneumonia) 04/18/2017   CAP (community acquired pneumonia) 04/17/2017   Elevated troponin 04/17/2017   Mitral stenosis    Dysphagia 10/20/2016   Constipation 10/20/2016   S/P mitral valve repair 02/27/2015   S/P Maze operation for atrial fibrillation 02/27/2015   S/P tricuspid valve repair 02/27/2015   Chronic kidney disease, stage II (mild)    Insomnia 09/02/2011   Chronic back pain 09/02/2011   EDEMA 01/18/2011   HYPOKALEMIA 09/17/2010   Normocytic anemia 09/11/2010   Chronic  diastolic heart failure ([place]) 08/28/2010   OTH NONSPECIFIC ABNORM CV SYSTEM FUNCTION STUDY 08/11/2010   PRECORDIAL PAIN 07/30/2010   DOE (dyspnea on exertion) 12/26/2009   ACUTE ON CHRONIC DIASTOLIC HEART FAILURE 36/64/4034   MITRAL REGURGITATION 07/07/2009   Essential hypertension 07/07/2009   ATRIAL FIBRILLATION 07/07/2009    Past Surgical History:  Procedure Laterality Date   APPENDECTOMY     AXILLARY LYMPH NODE DISSECTION Left 01/04/2018   Procedure:  LEFT AXILLARY LYMPH NODE DISSECTION;  Surgeon: [name], MD;  Location: [facility];  Service: General;  Laterality: Left;   BREAST BIOPSY Left 2009   CARDIAC VALVE SURGERY  02/27/2015   Mikel Cella. Mitral Valve Repair. Radical Repair with resection of P2 Prolapse, 26 mm Physio II Ring Annuloplasty, Tricuspid Valve Repair (26 mm Ring Annuloplasty, Cox Maze IV Procedure on 02/27/2015   CHOLECYSTECTOMY     COLONOSCOPY  2011   Dr. Laural Golden: 73mm polyp from hepatic flexure (tubular adenoma)   COLONOSCOPY N/A 11/10/2016   Dr. Gala Romney: Diverticulosis. Next colonoscopy 7 years, January 2025   ESOPHAGOGASTRODUODENOSCOPY  2005    Dr. Gala Romney : normal esophagus s/p dilation. Small hiatal hernia noted. Felt improved after dilation.    ESOPHAGOGASTRODUODENOSCOPY N/A 11/10/2016   Dr. Gala Romney: Moderate-sized hiatal hernia, esophagus normal status post empiric dilation for history of dysphagia   HEEL SPUR SURGERY Right    MALONEY DILATION N/A 11/10/2016   Procedure: Venia Minks DILATION;  Surgeon: Daneil Dolin, MD;  Location: [facility];  Service: Endoscopy;  Laterality: N/A;   MINIMALLY INVASIVE TRICUSPID VALVE REPAIR  02/27/2015   [place]   MITRAL VALVE REPAIR  02/27/2015   [place]   PARTIAL MASTECTOMY WITH NEEDLE LOCALIZATION Left 01/04/2018   Procedure: LEFT PARTIAL MASTECTOMY STATUS POST NEEDLE LOCALIZATION;  Surgeon: [name], MD;  Location: [facility];  Service: General;  Laterality: Left;   TEE WITHOUT CARDIOVERSION N/A 10/18/2016   Procedure: TRANSESOPHAGEAL ECHOCARDIOGRAM (TEE);  Surgeon: Arnoldo Lenis, MD;  Location: [facility];  Service: Endoscopy;  Laterality: N/A;   TOTAL ABDOMINAL HYSTERECTOMY     TOTAL KNEE ARTHROPLASTY Right    TUBAL LIGATION       OB History   No obstetric history on file.     Family History  Problem Relation Age of Onset   Hypertension Mother    Heart disease Mother    Kidney disease  Father    Stroke Father    Diabetes Sister    Kidney disease Sister    Heart disease Sister    Hypertension Sister    Heart disease Brother    Heart attack Son    Hypertension Son    Cancer Paternal Grandmother        Breast Cancer   Heart disease Brother    Colon cancer Neg Hx    Gastric cancer Neg Hx    Esophageal cancer Neg Hx     Social History   Tobacco Use   Smoking status: Former Smoker    Packs/day: 0.50    Years: 25.00    Pack years: 12.50    Types: Cigarettes    Quit date: 1997    Years since quitting: 24.7   Smokeless tobacco: Former Systems developer    Types: [place] date: 1975   Tobacco comment: dipped tobacco during pregancy in [place] Use   Vaping Use: Never used  Substance Use Topics   Alcohol use: Yes  Comment: a beer occasionally   Drug use: No    Home Medications Prior to Admission medications   Medication Sig Start Date End Date Taking? Authorizing Provider  torsemide (DEMADEX) 20 MG tablet TAKE 3 TABLETS ALTERNATING DAYS WITH 2 TABLETS. IF WEIGHT GOES ABOVE 230 LBS THEN TAKE 3 TABLETS DAILY UNTIL BACK DOWN AND THEN RESUME ALTERNATING DAYS WITH 3 TABLETS ONE DAY AND 2 TABLETS THE NEXT 07/10/20   Arnoldo Lenis, MD  acetaminophen (TYLENOL) 500 MG tablet Take 1,000 mg by mouth daily as needed for moderate pain or headache.    [provider]  albuterol (PROVENTIL HFA;VENTOLIN HFA) 108 (90 Base) MCG/ACT inhaler Inhale 2 puffs into the lungs every 6 (six) hours as needed for wheezing or shortness of breath. 06/29/17   Soyla Dryer, PA-C  allopurinol (ZYLOPRIM) 100 MG tablet Take 300 mg by mouth daily.  05/17/19   [provider]  amLODipine (NORVASC) 5 MG tablet Take 1 tablet (5 mg total) by mouth daily. 06/02/18   Caren Macadam, MD  aspirin EC 81 MG tablet Take 1 tablet (81 mg total) by mouth daily with breakfast. 06/20/19   Roxan Hockey, MD  cholecalciferol (VITAMIN D3) 25 MCG (1000 UNIT) tablet Take 1,000  Units by mouth daily.    [provider]  FLUoxetine (PROZAC) 20 MG capsule Take 1 capsule (20 mg total) by mouth daily. 06/20/19 06/19/20  Roxan Hockey, MD  gabapentin (NEURONTIN) 300 MG capsule TAKE 1 CAPSULE BY MOUTH IN THE MORNING AND 2 CAPSULES IN THE EVENING Patient taking differently: Take 300-600 mg by mouth See admin instructions. 300mg  in the morning and 600mg  in the evening 06/21/18   Caren Macadam, MD  guaiFENesin (MUCINEX) 600 MG 12 hr tablet Take 600 mg by mouth 2 (two) times daily as needed (congestion).    [provider]  LINZESS 72 MCG capsule TAKE 1 CAPSULE (72 MCG TOTAL) BY MOUTH DAILY BEFORE BREAKFAST. Patient taking differently: Take 72 mcg by mouth daily before breakfast.  11/22/18   Annitta Needs, NP  losartan (COZAAR) 100 MG tablet Take 1 tablet (100 mg total) by mouth daily. 11/30/17   Tanda Rockers, MD  magnesium gluconate (MAGONATE) 500 MG tablet Take 500 mg by mouth daily.    [provider]  metoprolol succinate (TOPROL-XL) 100 MG 24 hr tablet TAKE 1 TABLET BY MOUTH TWICE A DAY 08/21/19   Arnoldo Lenis, MD  pantoprazole (PROTONIX) 40 MG tablet Take 1 tablet (40 mg total) by mouth 2 (two) times daily before a meal. 12/10/19   Carlis Stable, NP  potassium chloride SA (K-DUR) 20 MEQ tablet Take 1 tablet (20 mEq total) by mouth daily. 06/20/19   Roxan Hockey, MD  trolamine salicylate (ASPERCREME) 10 % cream Apply 1 application topically as needed for muscle pain.    [provider]    Allergies    Iodinated diagnostic agents  Review of Systems   Review of Systems  Constitutional: Negative for chills and fever.  HENT: Negative for congestion, tinnitus, trouble swallowing and voice change.   Eyes: Negative for visual disturbance.  Respiratory: Negative for cough and shortness of breath.   Cardiovascular: Negative for chest pain and palpitations.  Gastrointestinal: Negative for abdominal pain, diarrhea, nausea and vomiting.    Genitourinary: Negative for enuresis, flank pain and frequency.  Musculoskeletal: Negative for back pain.  Skin: Negative for rash.  Neurological: Positive for headaches. Negative for dizziness, seizures and numbness.  Hematological: Does not bruise/bleed easily.  Physical Exam Updated Vital Signs BP (!) 144/68    Pulse 61    Temp 98.2 F (36.8 C) (Oral)    Resp 20    Ht 5\' 7"  (1.702 m)    Wt 102.1 kg    LMP  (LMP Unknown)    SpO2 96%    BMI 35.24 kg/m   Physical Exam Vitals and nursing note reviewed.  Constitutional:      General: She is not in acute distress.    Appearance: Normal appearance. She is not ill-appearing or diaphoretic.  HENT:     Head: Normocephalic and atraumatic.     Comments: Head was evaluated, no gross abnormalities noted, slight tender to palpation along the top of her head, no crepitus, battle sign or raccoon eyes noted.    Nose: No congestion or rhinorrhea.     Mouth/Throat:     Mouth: Mucous membranes are moist.     Pharynx: Oropharynx is clear.  Eyes:     General: No visual field deficit or scleral icterus.    Conjunctiva/sclera: Conjunctivae normal.     Pupils: Pupils are equal, round, and reactive to light.  Cardiovascular:     Rate and Rhythm: Normal rate and regular rhythm.     Pulses: Normal pulses.     Heart sounds: No murmur heard.  No friction rub. No gallop.   Pulmonary:     Effort: Pulmonary effort is normal. No respiratory distress.     Breath sounds: No wheezing, rhonchi or rales.  Abdominal:     General: There is no distension.     Palpations: Abdomen is soft.     Tenderness: There is no abdominal tenderness. There is no right CVA tenderness, left CVA tenderness or guarding.  Musculoskeletal:        General: No swelling or tenderness.     Right lower leg: No edema.     Left lower leg: No edema.  Skin:    General: Skin is warm and dry.     Capillary Refill: Capillary refill takes less than 2 seconds.     Findings: No rash.   Neurological:     General: No focal deficit present.     Mental Status: She is alert and oriented to person, place, and time.     GCS: GCS eye subscore is 4. GCS verbal subscore is 5. GCS motor subscore is 6.     Cranial Nerves: Cranial nerves are intact. No cranial nerve deficit or facial asymmetry.     Sensory: Sensation is intact. No sensory deficit.     Motor: Motor function is intact. No weakness or pronator drift.     Coordination: Coordination is intact. Romberg sign negative. Finger-Nose-Finger Test and Heel to [facility] Test normal.  Psychiatric:        Mood and Affect: Mood normal.     ED Results / Procedures / Treatments   Labs (all labs ordered are listed, but only abnormal results are displayed) Labs Reviewed  CBC WITH DIFFERENTIAL/PLATELET - Abnormal; Notable for the following components:      Result Value   RBC 3.63 (*)    Hemoglobin 11.4 (*)    MCV 100.3 (*)    Platelets 124 (*)    All other components within normal limits  BASIC METABOLIC PANEL - Abnormal; Notable for the following components:   BUN 24 (*)    Creatinine, Ser 1.18 (*)    GFR calc non Af Amer 48 (*)    GFR calc Af  Amer 56 (*)    All other components within normal limits    EKG None  Radiology CT Head Wo Contrast  Result Date: 07/25/2020 CLINICAL DATA:  Head trauma EXAM: CT HEAD WITHOUT CONTRAST TECHNIQUE: Contiguous axial images were obtained from the base of the skull through the vertex without intravenous contrast. COMPARISON:  None. FINDINGS: Brain: Ventricle size normal. Mild patchy white matter hypodensity bilaterally appears chronic. Small chronic infarct right superior cerebellum. Negative for acute infarct, hemorrhage, mass. Vascular: Negative for hyperdense vessel Skull: Negative Sinuses/Orbits: Paranasal sinuses clear.  Negative orbit. Other: None IMPRESSION: No acute abnormality. Mild chronic ischemic changes in the white matter and right cerebellum. Electronically Signed   By: Franchot Gallo M.D.   On: 07/25/2020 13:52    Procedures Procedures (including critical care time)  Medications Ordered in ED Medications  prochlorperazine (COMPAZINE) injection 10 mg (10 mg Intramuscular Given 07/25/20 1408)  acetaminophen (TYLENOL) tablet 650 mg (650 mg Oral Given 07/25/20 1437)    ED Course  I have reviewed the triage vital signs and the nursing notes.  Pertinent labs & imaging results that were available during my care of the patient were reviewed by me and considered in my medical decision making (see chart for details).    MDM Rules/Calculators/A&P                          I have personally reviewed all imaging, labs and have interpreted them.  Patient presents to the emergency department with chief complaint of headache x1 day.  She was alert and oriented, did not appear to be in acute distress, vital signs reassuring.  On exam patient had an ongoing headache, neuro exam benign no focal deficits noted, no crepitus felt during palpation of patient's head, lung sounds are clear bilaterally, abdomen soft nontender to palpation, no pedal edema noted.  Will order screening labs, scan patient's head, and provide migraine medication.  CBC does not show leukocytosis, does show macrocytic anemia which appears to be at baseline for patient.  CMP does not show Electra abnormalities, no signs of metabolic acidosis, slight AKI and elevated bun which appears to be at baseline for patient.CT head without contrast shows no acute abnormalities.  Patient evaluated after providing Compazine and Tylenol she states her head is feeling better.  I have low suspicion for systemic infection or meningitis as patient was nontoxic-appearing, vital signs reassuring, no leukocytosis seen on CBC, no torticollis, patient is fully vaccinated.  Low suspicion for CVA, intracranial head bleed or mass as there is no focal deficits noted on exam, imaging does not show any acute abnormalities.  Low suspicion for  hypertensive urgency as BP stayed within normal limits.  I suspect patient is having from a migraine and will recommend over-the-counter pain medication and follow with  care provider for further evaluation management.  Patient appears to be resting company, vital signs reassuring, no indication for hospital admission.  Patient is given at home care and strict return precautions.  Patient verbalized that she understood and agreed to plan. Final Clinical Impression(s) / ED Diagnoses Final diagnoses:  Bad headache    Rx / DC Orders ED Discharge Orders    None       Marcello Fennel, PA-C 07/25/20 1611    Virgel Manifold, MD 07/27/20 2043

## 2020-08-06 DIAGNOSIS — Z23 Encounter for immunization: Secondary | ICD-10-CM | POA: Diagnosis not present

## 2020-08-06 DIAGNOSIS — R519 Headache, unspecified: Secondary | ICD-10-CM | POA: Diagnosis not present

## 2020-08-06 DIAGNOSIS — N3281 Overactive bladder: Secondary | ICD-10-CM | POA: Diagnosis not present

## 2020-08-07 ENCOUNTER — Other Ambulatory Visit (HOSPITAL_COMMUNITY): Payer: Self-pay | Admitting: General Practice

## 2020-08-07 ENCOUNTER — Other Ambulatory Visit: Payer: Self-pay

## 2020-08-07 ENCOUNTER — Ambulatory Visit (HOSPITAL_COMMUNITY)
Admission: RE | Admit: 2020-08-07 | Discharge: 2020-08-07 | Disposition: A | Discharge status: Home / Self Care | Payer: Medicare HMO | Source: Ambulatory Visit | Attending: General Practice | Admitting: General Practice

## 2020-08-07 DIAGNOSIS — G8929 Other chronic pain: Secondary | ICD-10-CM

## 2020-08-07 DIAGNOSIS — R519 Headache, unspecified: Secondary | ICD-10-CM | POA: Insufficient documentation

## 2020-08-11 ENCOUNTER — Other Ambulatory Visit: Payer: Self-pay | Admitting: General Practice

## 2020-08-11 ENCOUNTER — Other Ambulatory Visit (HOSPITAL_COMMUNITY): Payer: Self-pay | Admitting: General Practice

## 2020-08-11 DIAGNOSIS — R519 Headache, unspecified: Secondary | ICD-10-CM

## 2020-08-14 IMAGING — DX DG ANKLE COMPLETE 3+V*L*
3 series · 3 of 3 positions shown · non-contrast
Comparison: Concurrently obtained radiographs of the foot and knee

CLINICAL DATA: 62-year-old female with left ankle pain after
falling in her kitchen the other day.

EXAM:
LEFT ANKLE COMPLETE - 3+ VIEW

[ankle ap]
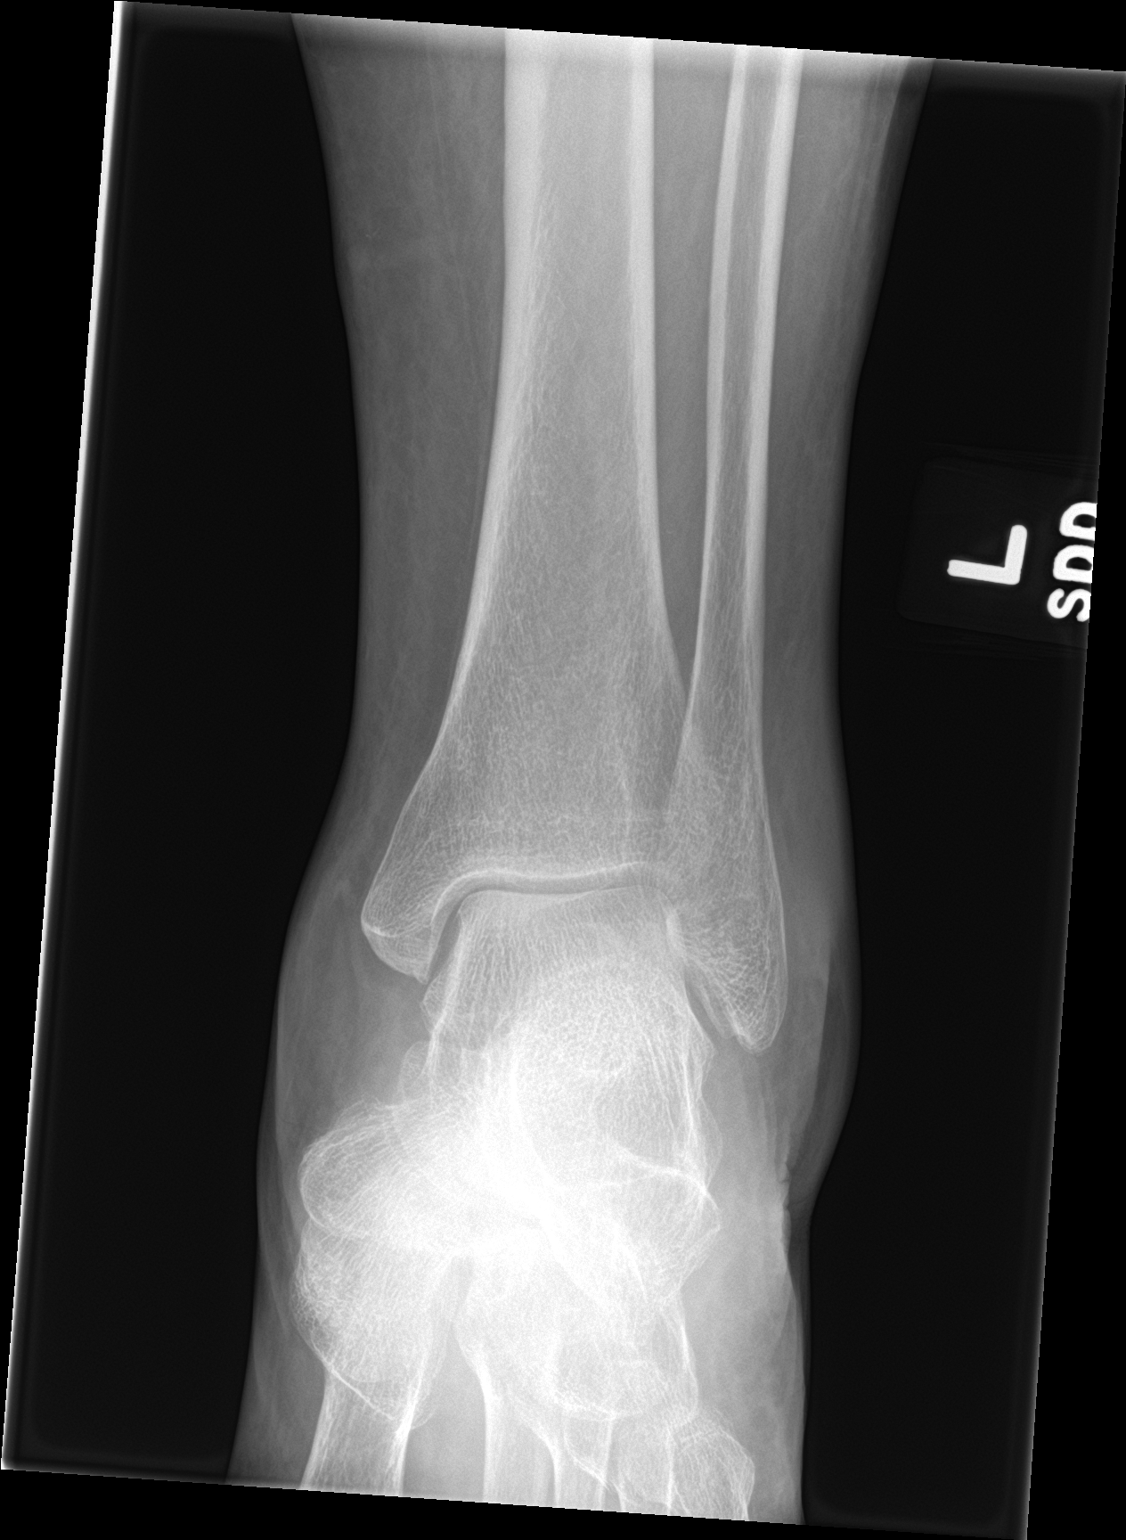

[ankle obl]
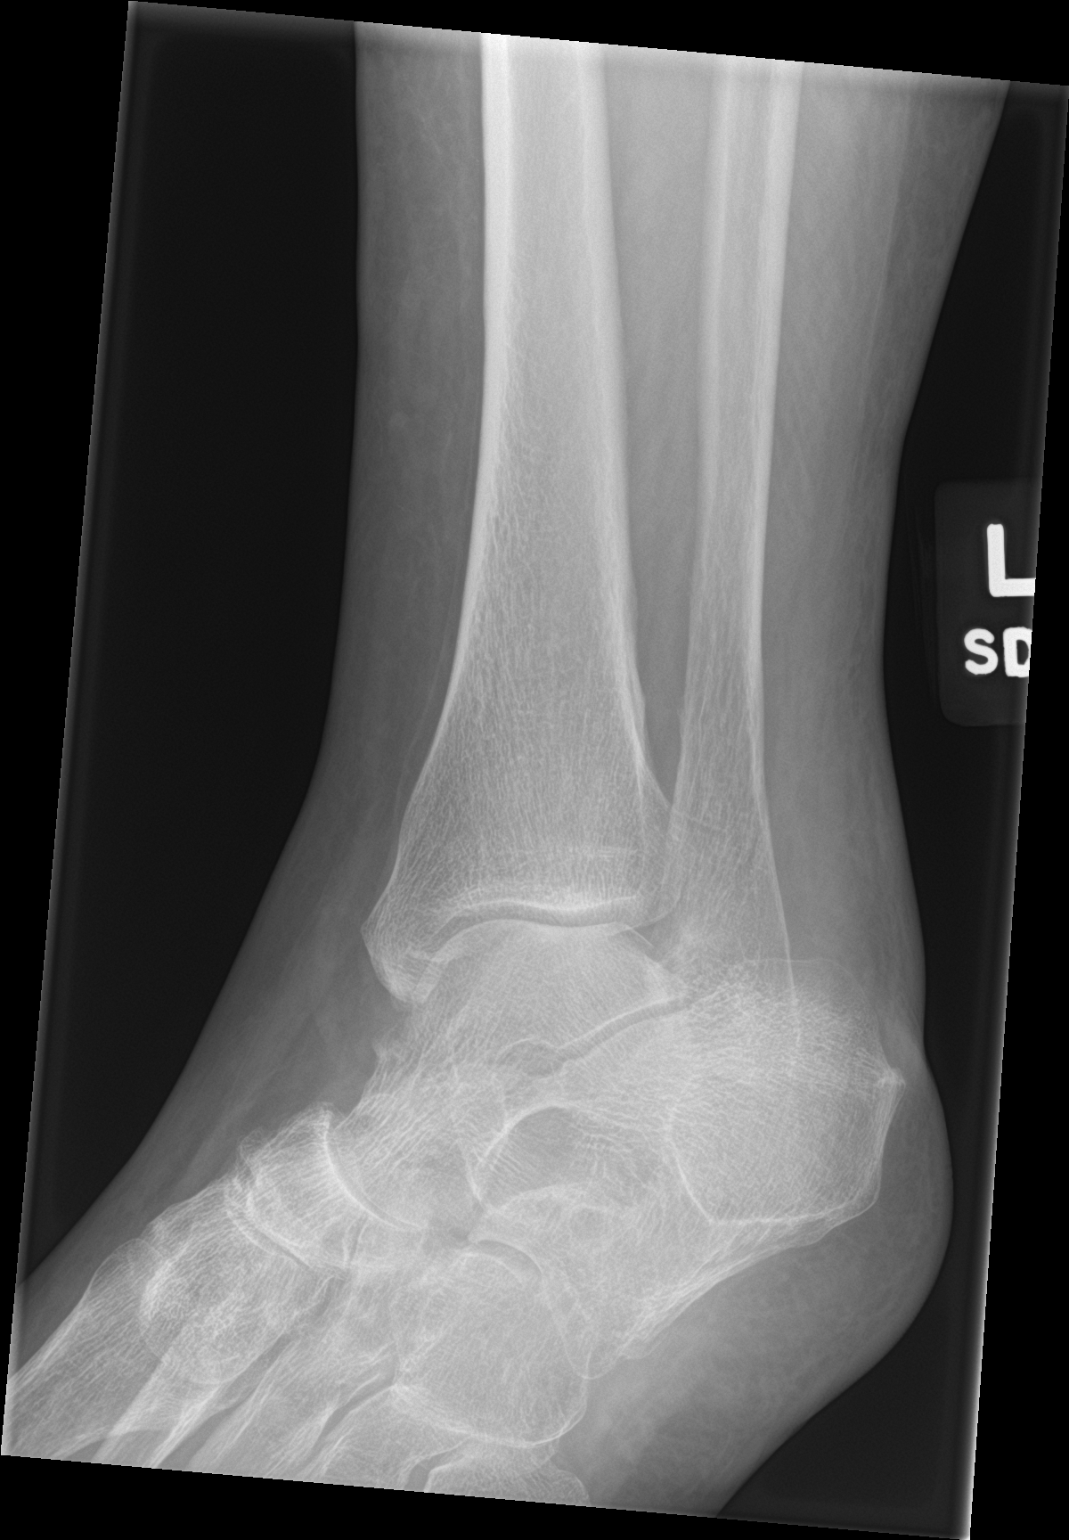

[ankle lat]
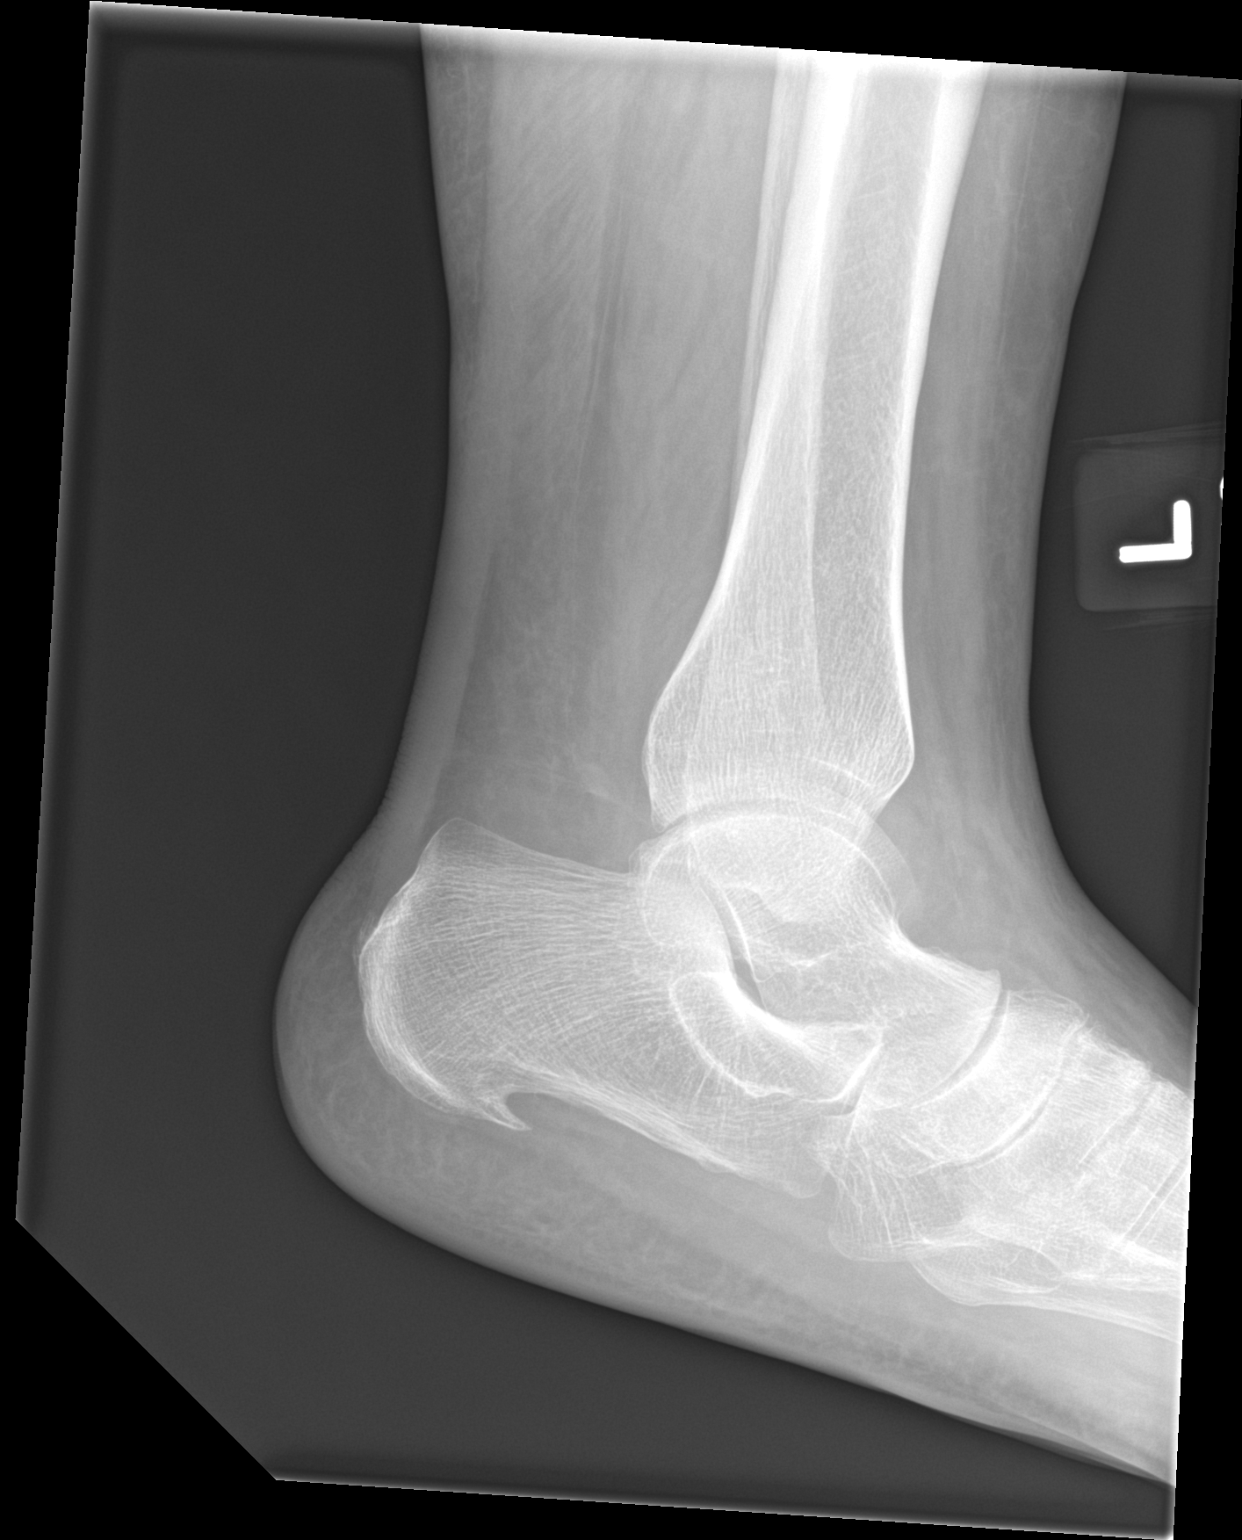

[3 of 3 positions shown; findings below may reference images not displayed]

FINDINGS: Incompletely imaged lucencies at the bases of the second, third and
fourth metatarsals. Otherwise, no acute fracture or malalignment.
Soft tissue swelling is present diffusely about the ankle. The ankle
mortise remains congruent in the talar dome is intact. Mild
degenerative changes present in the midfoot. Loss of the normal
plantar arch. Small calcaneal spur.
IMPRESSION: 1. No acute fracture, malalignment or ankle joint effusion.
2. See concurrently obtained but separately dictated radiographs of
the foot for description of metatarsal fractures.
3. Loss of the normal plantar arch. Diagnosis of pes planus usually
requires a weight-bearing lateral radiograph of the foot. However,
the current nonweightbearing images are suggestive.

## 2020-08-14 IMAGING — DX DG FOOT COMPLETE 3+V*L*
3 series · 3 of 3 positions shown · non-contrast
Comparison: Concurrently obtained radiographs of the left ankle and
left knee.

CLINICAL DATA: 62-year-old female with left foot pain after falling
in her kitchen the other day.

EXAM:
LEFT FOOT - COMPLETE 3+ VIEW

[foot ap]
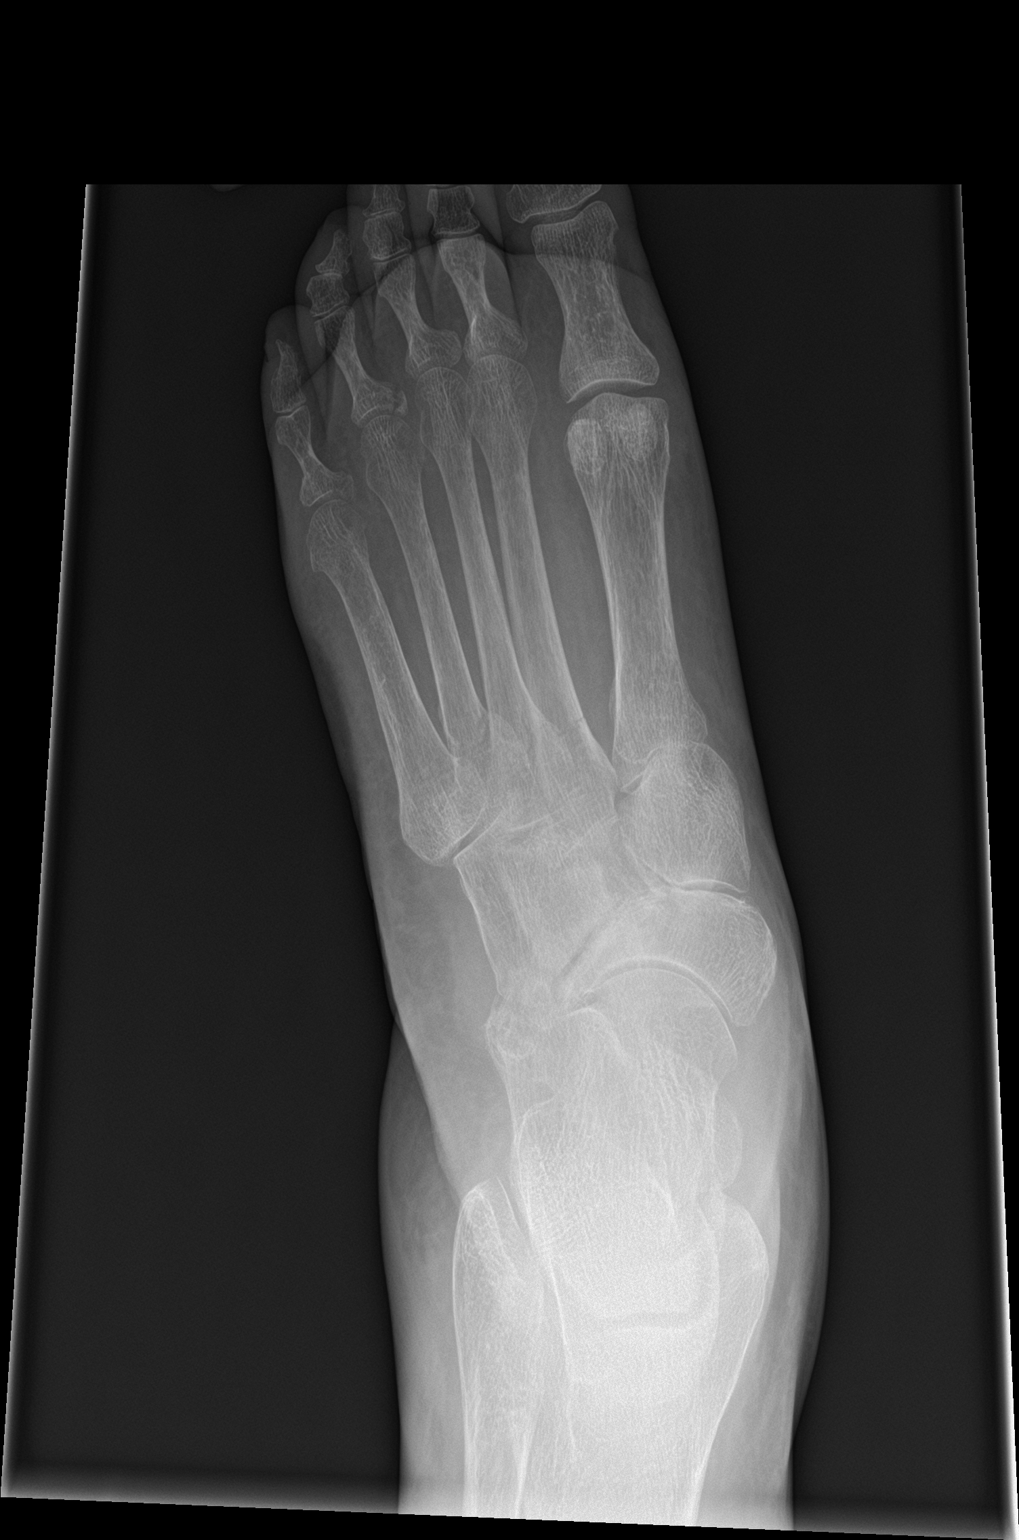

[foot obl]
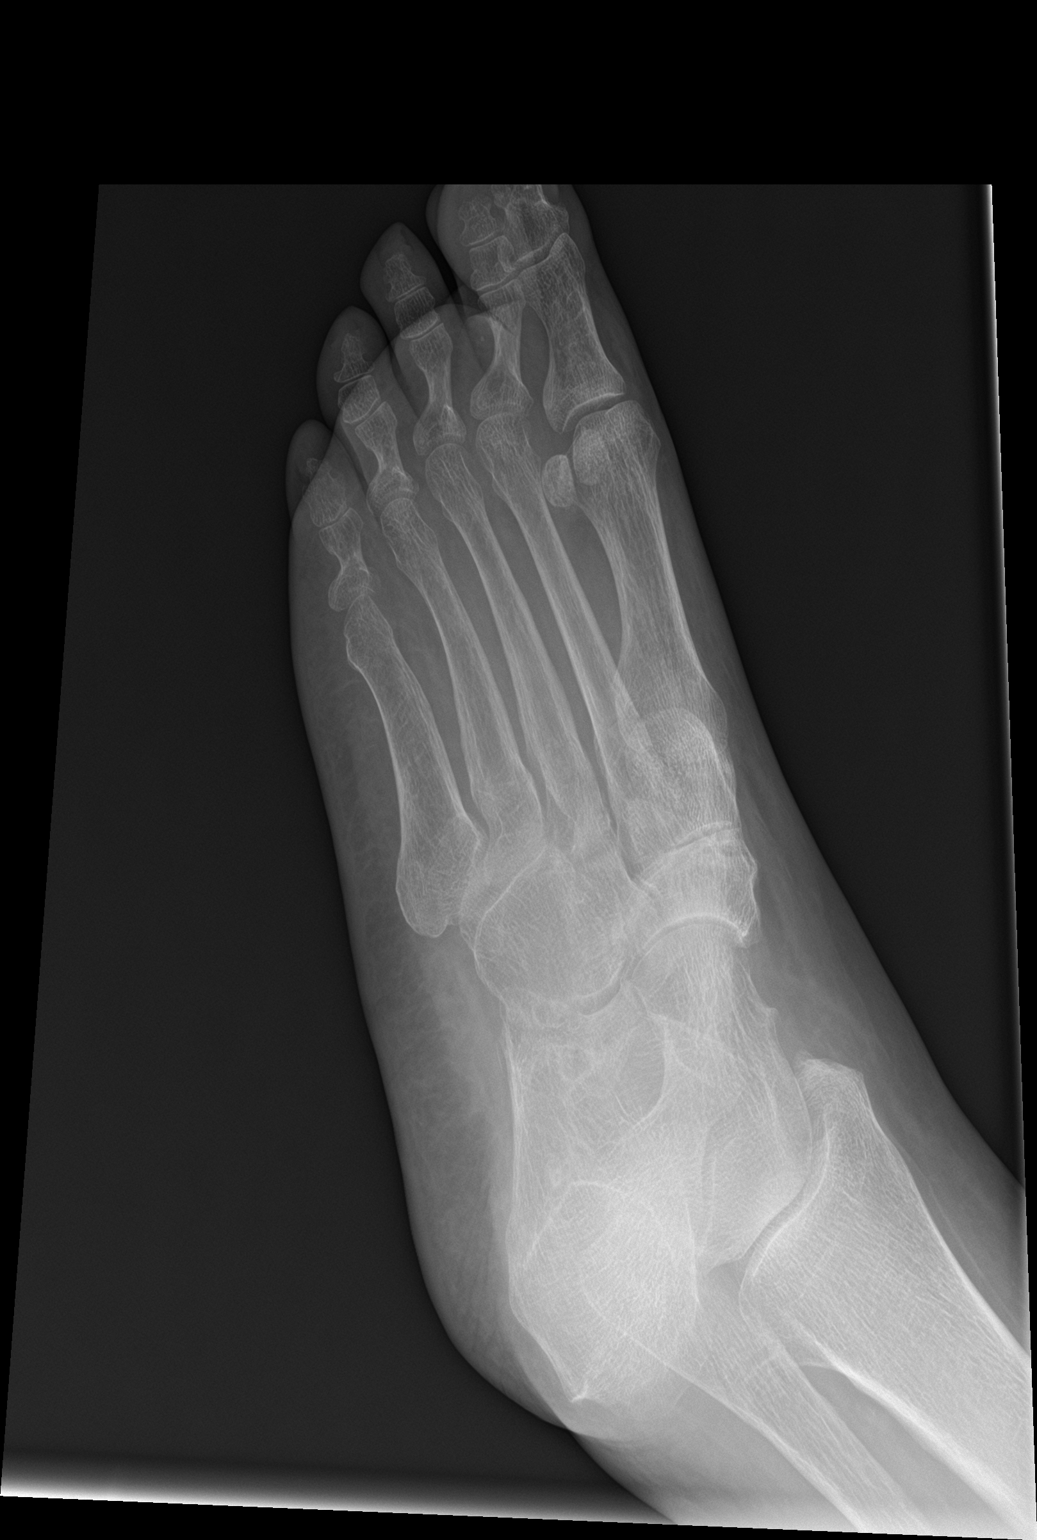

[foot lat]
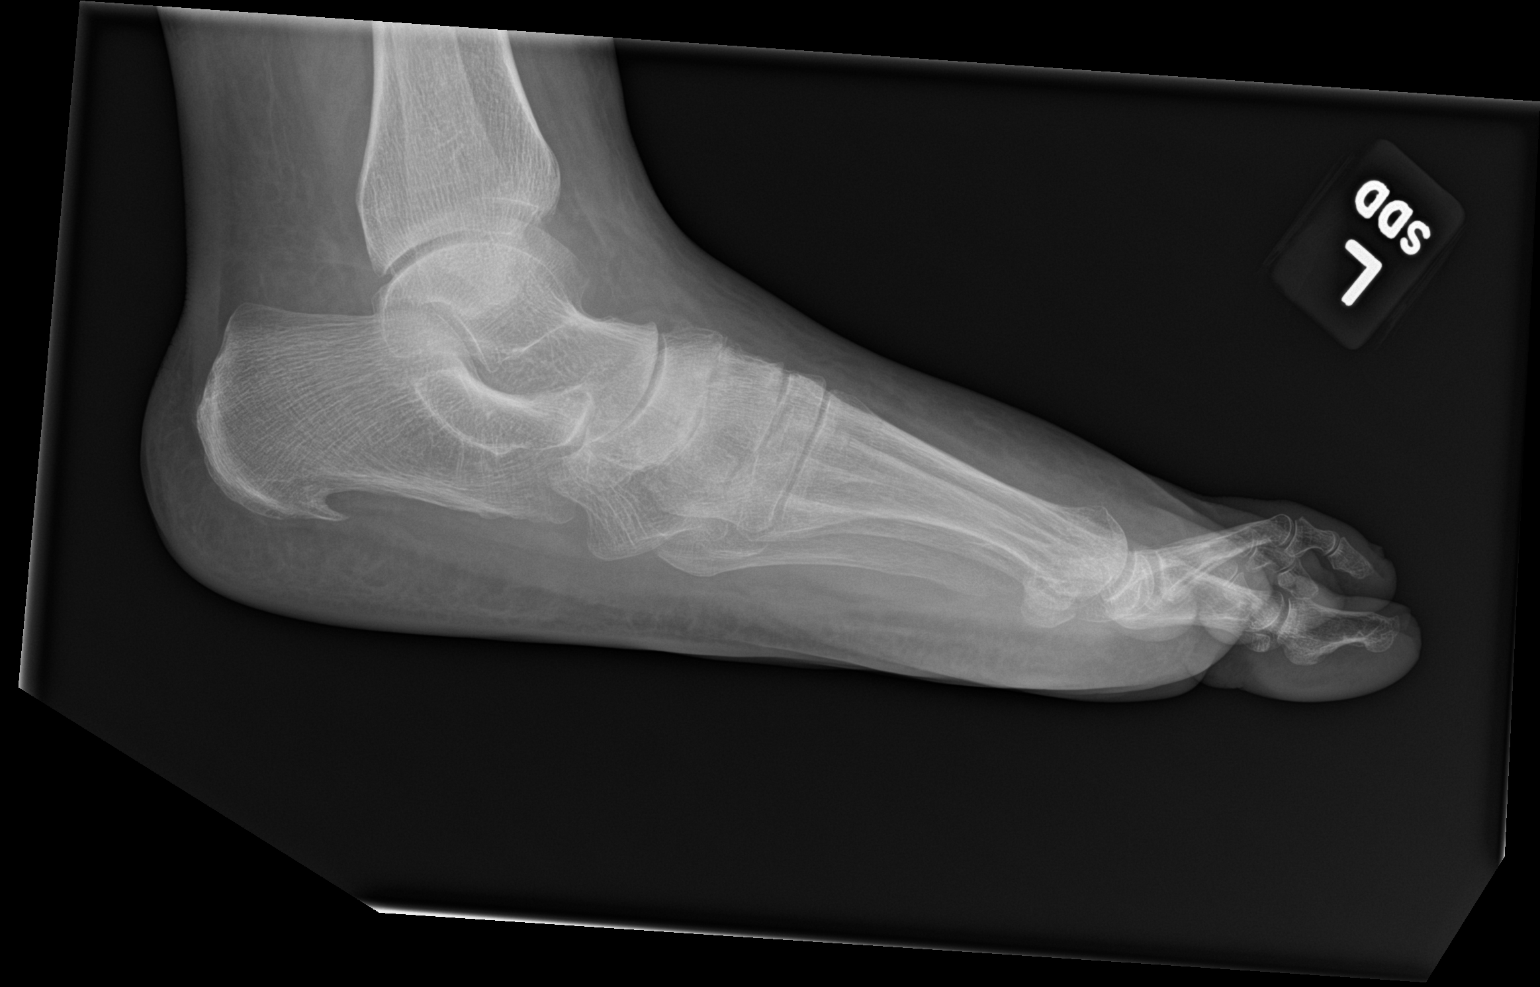

[3 of 3 positions shown; findings below may reference images not displayed]

FINDINGS: Linear lucencies are present throughout the bases of the second
third and fourth metatarsals consistent with nondisplaced fractures.
Additionally, there are nondisplaced fractures throughout the medial
base of the proximal phalanx of the fourth and fifth digits. The
bones appear diffusely osteopenic. Diffuse soft tissue swelling is
present around the forefoot. There appears to be loss of the normal
foot arch. Query pes planus.
IMPRESSION: 1. Acute nondisplaced fractures through the bases of the second,
third and fourth metatarsals.
2. Acute minimally displaced fractures through the medial aspect of
the base of the proximal phalanx of the fourth and fifth digits.
3. Loss of the normal plantar arch, query pes planus?

## 2020-08-20 DIAGNOSIS — R519 Headache, unspecified: Secondary | ICD-10-CM | POA: Diagnosis not present

## 2020-08-20 DIAGNOSIS — M792 Neuralgia and neuritis, unspecified: Secondary | ICD-10-CM | POA: Diagnosis not present

## 2020-08-20 DIAGNOSIS — Z79899 Other long term (current) drug therapy: Secondary | ICD-10-CM | POA: Diagnosis not present

## 2020-08-20 DIAGNOSIS — Z23 Encounter for immunization: Secondary | ICD-10-CM | POA: Diagnosis not present

## 2020-09-19 DIAGNOSIS — Z1231 Encounter for screening mammogram for malignant neoplasm of breast: Secondary | ICD-10-CM | POA: Diagnosis not present

## 2020-09-24 DIAGNOSIS — M792 Neuralgia and neuritis, unspecified: Secondary | ICD-10-CM | POA: Diagnosis not present

## 2020-10-15 DIAGNOSIS — I509 Heart failure, unspecified: Secondary | ICD-10-CM | POA: Diagnosis not present

## 2020-10-15 DIAGNOSIS — K219 Gastro-esophageal reflux disease without esophagitis: Secondary | ICD-10-CM | POA: Diagnosis not present

## 2020-10-15 DIAGNOSIS — G8929 Other chronic pain: Secondary | ICD-10-CM | POA: Diagnosis not present

## 2020-10-15 DIAGNOSIS — I110 Hypertensive heart disease with heart failure: Secondary | ICD-10-CM | POA: Diagnosis not present

## 2020-10-15 DIAGNOSIS — R69 Illness, unspecified: Secondary | ICD-10-CM | POA: Diagnosis not present

## 2020-10-15 DIAGNOSIS — E876 Hypokalemia: Secondary | ICD-10-CM | POA: Diagnosis not present

## 2020-10-15 DIAGNOSIS — G629 Polyneuropathy, unspecified: Secondary | ICD-10-CM | POA: Diagnosis not present

## 2020-10-15 DIAGNOSIS — K5900 Constipation, unspecified: Secondary | ICD-10-CM | POA: Diagnosis not present

## 2020-10-15 DIAGNOSIS — I10 Essential (primary) hypertension: Secondary | ICD-10-CM | POA: Diagnosis not present

## 2020-10-15 DIAGNOSIS — J45909 Unspecified asthma, uncomplicated: Secondary | ICD-10-CM | POA: Diagnosis not present

## 2020-10-15 DIAGNOSIS — E261 Secondary hyperaldosteronism: Secondary | ICD-10-CM | POA: Diagnosis not present

## 2020-10-23 ENCOUNTER — Other Ambulatory Visit: Payer: Self-pay | Admitting: Cardiology

## 2020-10-28 ENCOUNTER — Other Ambulatory Visit: Payer: Self-pay

## 2020-10-28 ENCOUNTER — Ambulatory Visit (INDEPENDENT_AMBULATORY_CARE_PROVIDER_SITE_OTHER): Payer: Medicare HMO | Admitting: Cardiology

## 2020-10-28 ENCOUNTER — Encounter: Payer: Self-pay | Admitting: Cardiology

## 2020-10-28 ENCOUNTER — Encounter: Payer: Self-pay | Admitting: *Deleted

## 2020-10-28 VITALS — BP 142/80 | HR 67 | Ht 67.0 in | Wt 233.8 lb

## 2020-10-28 DIAGNOSIS — Z9889 Other specified postprocedural states: Secondary | ICD-10-CM | POA: Diagnosis not present

## 2020-10-28 DIAGNOSIS — M79605 Pain in left leg: Secondary | ICD-10-CM

## 2020-10-28 DIAGNOSIS — M79604 Pain in right leg: Secondary | ICD-10-CM

## 2020-10-28 DIAGNOSIS — I10 Essential (primary) hypertension: Secondary | ICD-10-CM | POA: Diagnosis not present

## 2020-10-28 DIAGNOSIS — I480 Paroxysmal atrial fibrillation: Secondary | ICD-10-CM | POA: Diagnosis not present

## 2020-10-28 NOTE — Progress Notes (Signed)
Clinical Summary Ms. Ledvina is a 64 y.o.female seen today for follow up of the following medical problems.    1. Mitral regurgitations/p, now with post repair elevated gradient. - history of previous MV repair at [facility] 02/2015 - Radical Repair with resection of P2 Prolapse, 26 mm Physio II Ring Annuloplasty, Tricuspid Valve Repair (26 mm Ring Annuloplasty, Cox Maze IV Procedure on 02/27/2015 - cath prior to surgery showed no significant CAD - post repair echo 02/2015 showed no MR, mild TR. There are no reported gradients across the valves in the report  02/2015 PFTs normal(on care everywhere) -10/2017echo showed elevated gradients across her repaired MV and TV consistent with post repair stenosis.  - TEE 10/2016 showed restricted motion of posterior MV leaflet with mitral stenosis, mean resting gradient of 8 mmHg. - 10/2017 echo LVEF 45-50%, mean grad MV 11 mmHg.PASP 58. Moderate RV dysfunction 10/2018 echo LVEF 40-45%, LVEF 40-45%, MV mean grad 5, PASP 46  06/2019 echo: LVEF 50%, mod RV dysfunction, moderate AI, severe mitral stenosis mean grad 12 mmHg    - chronic SOB slightly worsened. Some recent LE edema.  - has missed some fluid pills at times, usually when busy running errands - home weights 225-230 lbs - taking torsemide 60mg  alt days 40mg    2. Leg pains - front of thighs bilateral, occurs with walking - cramping like pain bilateral legs,resolves with rest.    3.Chronic systolic/diastolicHF - noted on [id number] CXR. BNP up to 2514. - 10/2018 echo LVEF 40-45%, mean MV gradient 5 -previouslywe increased torsemide to 60mg  daily, later changed to 60mg  alternating with 40mg due to elevation in Cr which improved with change  - admission 06/2019 with volume overload in setting of poor dieatary and medication compliance - elevation in Cr, we lowered torsemide to 40mg  daily, had been on torsemide alternating 60mg  and 40mg . Cr imrpoved back to  prior range  - taking torsemide 60mg  alt days with 40mg      4. Afib -no recurrecne since MAZE procedure, has not been on anticoag - 14 day event monitor 2017 without afib   - denies any palpitations.   5. HTN - compliant with meds   6. 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   Has completed covid vaccine x 2.     Past Medical History:  Diagnosis Date  . Congestive heart failure, unspecified    Diastolic heart failure  . Degeneration of lumbar or lumbosacral intervertebral disc   . Depression   . Diverticulosis   . Dyspnea     chronic multifactorial large component of deconditioning  . Esophageal reflux   . Gout   . History of kidney stones   . Insomnia    Working third shift  . Mitral stenosis    Secondary to under-sized ring annuloplasty following mitral valve repair  . Mitral valve disorders(424.0)    Mild to moderate mitral regurgitation  . Mitral valve insufficiency and aortic valve insufficiency   . Morbid obesity ([place])   . Normocytic anemia   . Other chest pain   . Other dyspnea and respiratory abnormality   . Paroxysmal atrial fibrillation ([name])    Stable no recurrence  . Peripheral edema    chronic  . S/P Maze operation for atrial fibrillation 02/27/2015  . S/P mitral valve repair 02/27/2015   "radical repair" including resection of P2 segment of posterior leaflet with  26 mm Edwards Physio II annuloplasty ring  . S/P tricuspid valve repair 02/27/2015   26 mm annuloplasty ring  . Unspecified essential hypertension      Allergies  Allergen Reactions  . Iodinated Diagnostic Agents Swelling    EYES     Current Outpatient Medications  Medication Sig Dispense Refill  . torsemide (DEMADEX) 20 MG tablet TAKE 3 TABLETS ALTERNATING DAYS WITH 2 TABLETS. IF WEIGHT GOES ABOVE 230 LBS THEN TAKE 3 TABLETS DAILY UNTIL BACK DOWN AND THEN RESUME  ALTERNATING DAYS WITH 3 TABLETS ONE DAY AND 2 TABLETS THE NEXT 690 tablet 1  . acetaminophen (TYLENOL) 500 MG tablet Take 1,000 mg by mouth daily as needed for moderate pain or headache.    . albuterol (PROVENTIL HFA;VENTOLIN HFA) 108 (90 Base) MCG/ACT inhaler Inhale 2 puffs into the lungs every 6 (six) hours as needed for wheezing or shortness of breath. 1 Inhaler 3  . allopurinol (ZYLOPRIM) 100 MG tablet Take 300 mg by mouth daily.     Marland Kitchen amLODipine (NORVASC) 5 MG tablet Take 1 tablet (5 mg total) by mouth daily. 90 tablet 1  . aspirin EC 81 MG tablet Take 1 tablet (81 mg total) by mouth daily with breakfast. 90 tablet 3  . cholecalciferol (VITAMIN D3) 25 MCG (1000 UNIT) tablet Take 1,000 Units by mouth daily.    Marland Kitchen FLUoxetine (PROZAC) 20 MG capsule Take 1 capsule (20 mg total) by mouth daily. 30 capsule 2  . gabapentin (NEURONTIN) 300 MG capsule TAKE 1 CAPSULE BY MOUTH IN THE MORNING AND 2 CAPSULES IN THE EVENING (Patient taking differently: Take 300-600 mg by mouth See admin instructions. 300mg  in the morning and 600mg  in the evening) 270 capsule 0  . guaiFENesin (MUCINEX) 600 MG 12 hr tablet Take 600 mg by mouth 2 (two) times daily as needed (congestion).    Marland Kitchen LINZESS 72 MCG capsule TAKE 1 CAPSULE (72 MCG TOTAL) BY MOUTH DAILY BEFORE BREAKFAST. (Patient taking differently: Take 72 mcg by mouth daily before breakfast. ) 30 capsule 11  . losartan (COZAAR) 100 MG tablet Take 1 tablet (100 mg total) by mouth daily. 30 tablet 11  . magnesium gluconate (MAGONATE) 500 MG tablet Take 500 mg by mouth daily.    . metoprolol succinate (TOPROL-XL) 100 MG 24 hr tablet TAKE 1 TABLET BY MOUTH TWICE A DAY 180 tablet 3  . pantoprazole (PROTONIX) 40 MG tablet Take 1 tablet (40 mg total) by mouth 2 (two) times daily before a meal. 60 tablet 5  . potassium chloride SA (K-DUR) 20 MEQ tablet Take 1 tablet (20 mEq total) by mouth daily. 30 tablet 3  . trolamine salicylate (ASPERCREME) 10 % cream Apply 1 application  topically as needed for muscle pain.     No current facility-administered medications for this visit.     Past Surgical History:  Procedure Laterality Date  . APPENDECTOMY    . AXILLARY LYMPH NODE DISSECTION Left 01/04/2018   Procedure: LEFT AXILLARY LYMPH NODE DISSECTION;  Surgeon: [name], MD;  Location: [facility];  Service: General;  Laterality: Left;  . BREAST BIOPSY Left 2009  . CARDIAC VALVE SURGERY  02/27/2015   [facility]. Mitral Valve Repair. Radical Repair with resection of P2 Prolapse, 26 mm Physio II Ring Annuloplasty, Tricuspid Valve Repair (26 mm Ring Annuloplasty, Cox Maze IV Procedure on 02/27/2015  . CHOLECYSTECTOMY    . COLONOSCOPY  2011   Dr. Laural Golden: 58mm polyp from hepatic flexure (tubular adenoma)  . COLONOSCOPY N/A 11/10/2016  Dr. Gala Romney: Diverticulosis. Next colonoscopy 7 years, January 2025  . ESOPHAGOGASTRODUODENOSCOPY  2005    Dr. Gala Romney : normal esophagus s/p dilation. Small hiatal hernia noted. Felt improved after dilation.   . ESOPHAGOGASTRODUODENOSCOPY N/A 11/10/2016   Dr. Gala Romney: Moderate-sized hiatal hernia, esophagus normal status post empiric dilation for history of dysphagia  . HEEL SPUR SURGERY Right   . MALONEY DILATION N/A 11/10/2016   Procedure: Venia Minks DILATION;  Surgeon: Daneil Dolin, MD;  Location: [facility];  Service: Endoscopy;  Laterality: N/A;  . MINIMALLY INVASIVE TRICUSPID VALVE REPAIR  02/27/2015   [place]  . MITRAL VALVE REPAIR  02/27/2015   [place]  . PARTIAL MASTECTOMY WITH NEEDLE LOCALIZATION Left 01/04/2018   Procedure: LEFT PARTIAL MASTECTOMY STATUS POST NEEDLE LOCALIZATION;  Surgeon: [name], MD;  Location: [facility];  Service: General;  Laterality: Left;  . TEE WITHOUT CARDIOVERSION N/A 10/18/2016   Procedure: TRANSESOPHAGEAL ECHOCARDIOGRAM (TEE);  Surgeon: Arnoldo Lenis, MD;  Location: [facility];  Service: Endoscopy;  Laterality: N/A;  . TOTAL ABDOMINAL  HYSTERECTOMY    . TOTAL KNEE ARTHROPLASTY Right   . TUBAL LIGATION       Allergies  Allergen Reactions  . Iodinated Diagnostic Agents Swelling    EYES      Family History  Problem Relation Age of Onset  . Hypertension Mother   . Heart disease Mother   . Kidney disease Father   . Stroke Father   . Diabetes Sister   . Kidney disease Sister   . Heart disease Sister   . Hypertension Sister   . Heart disease Brother   . Heart attack Son   . Hypertension Son   . Cancer Paternal Grandmother        Breast Cancer  . Heart disease Brother   . Colon cancer Neg Hx   . Gastric cancer Neg Hx   . Esophageal cancer Neg Hx      Social History Ms. Ciervo reports that she quit smoking about 24 years ago. Her smoking use included cigarettes. She has a 12.50 pack-year smoking history. She quit smokeless tobacco use about 47 years ago.  Her smokeless tobacco use included chew. Ms. Vanlenten reports current alcohol use.   Review of Systems CONSTITUTIONAL: No weight loss, fever, chills, weakness or fatigue.  HEENT: Eyes: No visual loss, blurred vision, double vision or yellow sclerae.No hearing loss, sneezing, congestion, runny nose or sore throat.  SKIN: No rash or itching.  CARDIOVASCULAR: per hpi RESPIRATORY: No shortness of breath, cough or sputum.  GASTROINTESTINAL: No anorexia, nausea, vomiting or diarrhea. No abdominal pain or blood.  GENITOURINARY: No burning on urination, no polyuria NEUROLOGICAL: No headache, dizziness, syncope, paralysis, ataxia, numbness or tingling in the extremities. No change in bowel or bladder control.  MUSCULOSKELETAL: No muscle, back pain, joint pain or stiffness.  LYMPHATICS: No enlarged nodes. No history of splenectomy.  PSYCHIATRIC: No history of depression or anxiety.  ENDOCRINOLOGIC: No reports of sweating, cold or heat intolerance. No polyuria or polydipsia.  Marland Kitchen   Physical Examination Today's Vitals   10/28/20 1259  BP: (!) 142/80  Pulse: 67   SpO2: 93%  Weight: 233 lb 12.8 oz (106.1 kg)  Height: 5\' 7"  (1.702 m)   Body mass index is 36.62 kg/m.  Gen: resting comfortably, no acute distress HEENT: no scleral icterus, pupils equal round and reactive, no palptable cervical adenopathy,  CV: RRR, no m/r/g, no jvd Resp: Clear to auscultation  bilaterally GI: abdomen is soft, non-tender, non-distended, normal bowel sounds, no hepatosplenomegaly MSK: extremities are warm, no edema.  Skin: warm, no rash Neuro:  no focal deficits Psych: appropriate affect   Diagnostic Studies 08/2016 echo Study Conclusions  - Left ventricle: The cavity size was normal. Wall thickness was increased in a pattern of mild LVH. Systolic function was normal. The estimated ejection fraction was in the range of 50% to 55%. Wall motion was normal; there were no regional wall motion abnormalities. The study was not technically sufficient to allow evaluation of LV diastolic dysfunction due to atrial fibrillation. - Aortic valve: Mildly calcified annulus. Trileaflet; mildly thickened leaflets. There was mild regurgitation. Valve area (VTI): 2.47 cm^2. Valve area (Vmax): 2.47 cm^2. Valve area (Vmean): 2.43 cm^2. - Mitral valve: The is a MV anular ring present s/p mitral valve repair. There is an increased gradient across the MV. Mean gradient 12 mmHg. - Left atrium: The atrium was severely dilated. - Right ventricle: The cavity size was mildly dilated. Systolic function was mildly to moderately reduced. - Right atrium: The atrium was mildly dilated. - Tricuspid valve: A TV anular ring is present. Mean gradient across the TV of 7 mmHg. - Pulmonary arteries: Systolic pressure was mildly increased. PA peak pressure: 31 mm Hg (S). - Technically adequate study.   10/2016 TEE Study Conclusions  - Left ventricle: The cavity size was normal. Wall thickness was normal. Systolic function was normal. The estimated  ejection fraction was in the range of 55% to 60%. - Mitral valve: The posterior leaflet is moderately thickened with severely restricted motion. There is a moderate to severe gradient across the MV of 8 mmHg. There was mild regurgitation. Mean gradient (D): 8 mm Hg. - Left atrium: The atrium was severely dilated. - Right atrium: No evidence of thrombus in the atrial cavity or appendage.   10/2017 echo Study Conclusions  - Left ventricle: The cavity size was normal. Wall thickness was increased in a pattern of mild LVH. Systolic function was mildly reduced. The estimated ejection fraction was in the range of 45% to 50%. Diffuse hypokinesis. The study is not technically sufficient to allow evaluation of LV diastolic function. - Aortic valve: Mildly calcified annulus. Trileaflet. Mean gradient (S): 3 mm Hg. Valve area (VTI): 2.21 cm^2. - Mitral valve: The findings are consistent with severe stenosis. There was trivial regurgitation. Mean gradient (D): 11 mm Hg. Planimetered valve area: 0.9 cm^2. Valve area by continuity equation (using LVOT flow): 0.68 cm^2. - Left atrium: The atrium was severely dilated. - Right ventricle: The cavity size was mildly dilated. Systolic function was moderately reduced. - Right atrium: The atrium was mildly dilated. Central venous pressure (est): 15 mm Hg. - Atrial septum: No defect or patent foramen ovale was identified. - Tricuspid valve: There was mild regurgitation. - Pulmonary arteries: PA peak pressure: 58 mm Hg (S). - Pericardium, extracardiac: There was no pericardial effusion.  Impressions:  - Mild LVH with LVEF approximately 45-50%. Indeterminate diastolic function. Severe left atrial enlargement. Findings consistent with severe mitral stenosis as detailed above. Moderately reduced right ventricular contraction. Mild tricuspid regurgitation with elevated PASP estimated 58 mmHg.   10/2018  echo Study Conclusions  - Left ventricle: LVEF is depressed at approximately 40 to 45% with hypokinesis worse in the inferior, inferoseptal walls. The cavity size was normal. Wall thickness was normal. Systolic function was mildly to moderately reduced. The estimated ejection fraction was in the range of 40% to 45%. - Mitral valve: MV  is thickened with restricted motion. Peak and mean gradients through the valve are 21 and 5 mm Hg MVA by P T1/2 is 1.3 cm2 consistent with severe mitral stenosis Compared to previous echo in 2018, MVA is smaller. - Left atrium: The atrium was severely dilated. - Right ventricle: Systolic function was mildly reduced. - Right atrium: The atrium was mildly dilated. - Pulmonary arteries: PA peak pressure: 46 mm Hg (S).    Assessment and Plan  1. Valvular heart disease/SOB/Chronic systolic/diastolicHF - Diuretic dosing has been limited by prior Cr elevation - overall stable home weights and symptoms, continue current therapy.    2. History of afib - no clear recurrence since prior MAZE procedure, has not been on anticoag - no recent symptoms, EKG today shows SR  3. HTN - manual repeat 134/80, essentially at goal,. Continue current meds  4. Leg pains - obtain ABIs  F/u 6 months     Arnoldo Lenis, M.D.

## 2020-10-28 NOTE — Patient Instructions (Signed)
Your physician wants you to follow-up in: 6 MONTHS WITH DR BRANCH You will receive a reminder letter in the mail two months in advance. If you don't receive a letter, please call our office to schedule the follow-up appointment.  Your physician recommends that you continue on your current medications as directed. Please refer to the Current Medication list given to you today.  Your physician has requested that you have an ankle brachial index (ABI). During this test an ultrasound and blood pressure cuff are used to evaluate the arteries that supply the arms and legs with blood. Allow thirty minutes for this exam. There are no restrictions or special instructions.  Thank you for choosing [facility]!!    

## 2020-10-28 NOTE — Addendum Note (Signed)
Addended by: Julian Hy T on: 10/28/2020 03:34 PM   Modules accepted: Orders

## 2020-10-29 ENCOUNTER — Other Ambulatory Visit: Payer: Self-pay | Admitting: Cardiology

## 2020-10-29 ENCOUNTER — Ambulatory Visit (INDEPENDENT_AMBULATORY_CARE_PROVIDER_SITE_OTHER): Payer: Medicare HMO

## 2020-10-29 DIAGNOSIS — I739 Peripheral vascular disease, unspecified: Secondary | ICD-10-CM

## 2021-01-06 DIAGNOSIS — Z79899 Other long term (current) drug therapy: Secondary | ICD-10-CM | POA: Diagnosis not present

## 2021-01-14 DIAGNOSIS — M549 Dorsalgia, unspecified: Secondary | ICD-10-CM | POA: Diagnosis not present

## 2021-01-14 DIAGNOSIS — M792 Neuralgia and neuritis, unspecified: Secondary | ICD-10-CM | POA: Diagnosis not present

## 2021-01-14 DIAGNOSIS — I5022 Chronic systolic (congestive) heart failure: Secondary | ICD-10-CM | POA: Diagnosis not present

## 2021-01-14 DIAGNOSIS — E785 Hyperlipidemia, unspecified: Secondary | ICD-10-CM | POA: Diagnosis not present

## 2021-01-14 DIAGNOSIS — I509 Heart failure, unspecified: Secondary | ICD-10-CM | POA: Diagnosis not present

## 2021-01-14 DIAGNOSIS — N1831 Chronic kidney disease, stage 3a: Secondary | ICD-10-CM | POA: Diagnosis not present

## 2021-01-14 DIAGNOSIS — I10 Essential (primary) hypertension: Secondary | ICD-10-CM | POA: Diagnosis not present

## 2021-04-15 DIAGNOSIS — M549 Dorsalgia, unspecified: Secondary | ICD-10-CM | POA: Diagnosis not present

## 2021-04-15 DIAGNOSIS — I10 Essential (primary) hypertension: Secondary | ICD-10-CM | POA: Diagnosis not present

## 2021-04-15 DIAGNOSIS — N1831 Chronic kidney disease, stage 3a: Secondary | ICD-10-CM | POA: Diagnosis not present

## 2021-04-15 DIAGNOSIS — I509 Heart failure, unspecified: Secondary | ICD-10-CM | POA: Diagnosis not present

## 2021-04-15 DIAGNOSIS — I5022 Chronic systolic (congestive) heart failure: Secondary | ICD-10-CM | POA: Diagnosis not present

## 2021-04-15 DIAGNOSIS — E785 Hyperlipidemia, unspecified: Secondary | ICD-10-CM | POA: Diagnosis not present

## 2021-05-01 ENCOUNTER — Encounter: Payer: Self-pay | Admitting: Cardiology

## 2021-05-01 ENCOUNTER — Encounter: Payer: Self-pay | Admitting: *Deleted

## 2021-05-01 ENCOUNTER — Other Ambulatory Visit: Payer: Self-pay

## 2021-05-01 ENCOUNTER — Ambulatory Visit (INDEPENDENT_AMBULATORY_CARE_PROVIDER_SITE_OTHER): Payer: Medicare HMO | Admitting: Cardiology

## 2021-05-01 VITALS — BP 130/70 | HR 66 | Ht 67.0 in | Wt 224.0 lb

## 2021-05-01 DIAGNOSIS — R0602 Shortness of breath: Secondary | ICD-10-CM

## 2021-05-01 DIAGNOSIS — I4891 Unspecified atrial fibrillation: Secondary | ICD-10-CM

## 2021-05-01 DIAGNOSIS — I5022 Chronic systolic (congestive) heart failure: Secondary | ICD-10-CM

## 2021-05-01 DIAGNOSIS — Z9889 Other specified postprocedural states: Secondary | ICD-10-CM | POA: Diagnosis not present

## 2021-05-01 NOTE — Patient Instructions (Signed)

## 2021-05-01 NOTE — Progress Notes (Signed)
Clinical Summary Toni [name] is a 65 y.o.femaleseen today for follow up of the following medical problems.         1. Mitral regurgitation s/p, now with post repair elevated gradient.  - history of previous MV repair at [facility] 02/2015 - Radical Repair with resection of P2 Prolapse, 26 mm Physio II Ring Annuloplasty, Tricuspid Valve Repair (26 mm Ring Annuloplasty, Cox Maze IV Procedure on 02/27/2015 - cath prior to surgery showed no significant CAD - post repair echo 02/2015 showed no MR, mild TR. There are no reported gradients across the valves in the report     02/2015 PFTs normal(on care everywhere)  -08/2016 echo showed elevated gradients across her repaired MV and TV consistent with post repair stenosis. - TEE 10/2016 showed restricted motion of posterior MV leaflet with mitral stenosis, mean resting gradient of 8 mmHg.  - 10/2017 echo LVEF 45-50%, mean grad MV 11 mmHg.PASP 58. Moderate RV dysfunction  10/2018 echo LVEF 40-45%, LVEF 40-45%, MV mean grad 5, PASP 46    06/2019 echo: LVEF 50%, mod RV dysfunction, moderate AI, severe mitral stenosis mean grad  12 mmHg     - chronic SOB has progressed to some degree - swelling is controlled - home weights stable 220-222 lbs and stable.  - takes torsemide about 4 times a week, just 20mg .       2. Leg pains - front of thighs bilateral, occurs with walking - cramping like pain bilateral legs,resolves with rest.    10/2020 ABI: normal bilateral   3.Chronic systolic/diastolic HF - noted on 61/6073 CXR. BNP up to 2514.  - 10/2018 echo LVEF 40-45%, mean MV gradient 5 -previously we increased torsemide to 60mg  daily, later changed to 60mg  alternating with 40mg  due to elevation in Cr which improved with change   - admission 06/2019 with volume overload in setting of poor dieatary and medication compliance  - elevation in Cr, we lowered torsemide to 40mg  daily, had been on torsemide alternating 60mg  and 40mg . Cr imrpoved back  to prior range   -currently taking torsemide 20mg  3-4 times a week, which is controlling her weight and fluid.           4. Afib - no recurrecne since MAZE procedure, has not been on anticoag  - 14 day event monitor 2017 without afib     - no recent palpitations      5. 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     Past Medical History:  Diagnosis Date   Congestive heart failure, unspecified    Diastolic heart failure   Degeneration of lumbar or lumbosacral intervertebral disc    Depression    Diverticulosis    Dyspnea     chronic multifactorial large component of deconditioning   Esophageal reflux    Gout    History of kidney stones    Insomnia    Working third shift   Mitral stenosis    Secondary to under-sized ring annuloplasty following mitral valve repair   Mitral valve disorders(424.0)    Mild to moderate mitral regurgitation   Mitral valve insufficiency and aortic valve insufficiency    Morbid obesity ([name])    Normocytic anemia    Other chest pain    Other dyspnea and respiratory abnormality    Paroxysmal atrial fibrillation ([name])    Stable no recurrence   Peripheral edema  chronic   S/P Maze operation for atrial fibrillation 02/27/2015   S/P mitral valve repair 02/27/2015   "radical repair" including resection of P2 segment of posterior leaflet with 26 mm Edwards Physio II annuloplasty ring   S/P tricuspid valve repair 02/27/2015   26 mm annuloplasty ring   Unspecified essential hypertension      Allergies  Allergen Reactions   Iodinated Diagnostic Agents Swelling    EYES     Current Outpatient Medications  Medication Sig Dispense Refill   acetaminophen (TYLENOL) 500 MG tablet Take 1,000 mg by mouth daily as needed for moderate pain or headache.     Acetaminophen-Codeine 300-30 MG tablet Take 1 tablet by mouth 2 (two) times daily as needed.     albuterol (PROVENTIL  HFA;VENTOLIN HFA) 108 (90 Base) MCG/ACT inhaler Inhale 2 puffs into the lungs every 6 (six) hours as needed for wheezing or shortness of breath. 1 Inhaler 3   allopurinol (ZYLOPRIM) 100 MG tablet Take 300 mg by mouth daily.      amLODipine (NORVASC) 5 MG tablet Take 1 tablet (5 mg total) by mouth daily. 90 tablet 1   aspirin EC 81 MG tablet Take 1 tablet (81 mg total) by mouth daily with breakfast. 90 tablet 3   cholecalciferol (VITAMIN D3) 25 MCG (1000 UNIT) tablet Take 1,000 Units by mouth daily.     FLUoxetine (PROZAC) 20 MG capsule Take 1 capsule (20 mg total) by mouth daily. 30 capsule 2   gabapentin (NEURONTIN) 300 MG capsule TAKE 1 CAPSULE BY MOUTH IN THE MORNING AND 2 CAPSULES IN THE EVENING (Patient taking differently: Take 300-600 mg by mouth See admin instructions. 300mg  in the morning and 600mg  in the evening) 270 capsule 0   guaiFENesin (MUCINEX) 600 MG 12 hr tablet Take 600 mg by mouth 2 (two) times daily as needed (congestion).     LINZESS 72 MCG capsule TAKE 1 CAPSULE (72 MCG TOTAL) BY MOUTH DAILY BEFORE BREAKFAST. (Patient taking differently: Take 72 mcg by mouth daily before breakfast.) 30 capsule 11   losartan (COZAAR) 100 MG tablet Take 1 tablet (100 mg total) by mouth daily. 30 tablet 11   magnesium gluconate (MAGONATE) 500 MG tablet Take 500 mg by mouth daily.     metoprolol succinate (TOPROL-XL) 100 MG 24 hr tablet TAKE 1 TABLET BY MOUTH TWICE A DAY 180 tablet 3   pantoprazole (PROTONIX) 40 MG tablet Take 1 tablet (40 mg total) by mouth 2 (two) times daily before a meal. 60 tablet 5   potassium chloride SA (K-DUR) 20 MEQ tablet Take 1 tablet (20 mEq total) by mouth daily. 30 tablet 3   torsemide (DEMADEX) 20 MG tablet TAKE 3 TABLETS ALTERNATING DAYS WITH 2 TABLETS. IF WEIGHT GOES ABOVE 230 LBS THEN TAKE 3 TABLETS DAILY UNTIL BACK DOWN AND THEN RESUME ALTERNATING DAYS WITH 3 TABLETS ONE DAY AND 2 TABLETS THE NEXT 690 tablet 1   trolamine salicylate (ASPERCREME) 10 % cream Apply 1  application topically as needed for muscle pain.     No current facility-administered medications for this visit.     Past Surgical History:  Procedure Laterality Date   APPENDECTOMY     AXILLARY LYMPH NODE DISSECTION Left 01/04/2018   Procedure: LEFT AXILLARY LYMPH NODE DISSECTION;  Surgeon: [name], MD;  Location: [facility];  Service: General;  Laterality: Left;   BREAST BIOPSY Left 2009   CARDIAC VALVE SURGERY  02/27/2015   Mikel Cella. Mitral Valve Repair. Radical Repair with resection  of P2 Prolapse, 26 mm Physio II Ring Annuloplasty, Tricuspid Valve Repair (26 mm Ring Annuloplasty, Cox Maze IV Procedure on 02/27/2015   CHOLECYSTECTOMY     COLONOSCOPY  2011   Dr. Laural Golden: 40mm polyp from hepatic flexure (tubular adenoma)   COLONOSCOPY N/A 11/10/2016   Dr. Gala Romney: Diverticulosis. Next colonoscopy 7 years, January 2025   ESOPHAGOGASTRODUODENOSCOPY  2005    Dr. Gala Romney : normal esophagus s/p dilation. Small hiatal hernia noted. Felt improved after dilation.    ESOPHAGOGASTRODUODENOSCOPY N/A 11/10/2016   Dr. Gala Romney: Moderate-sized hiatal hernia, esophagus normal status post empiric dilation for history of dysphagia   HEEL SPUR SURGERY Right    MALONEY DILATION N/A 11/10/2016   Procedure: Venia Minks DILATION;  Surgeon: Daneil Dolin, MD;  Location: [facility];  Service: Endoscopy;  Laterality: N/A;   MINIMALLY INVASIVE TRICUSPID VALVE REPAIR  02/27/2015   [place]   MITRAL VALVE REPAIR  02/27/2015   [place]   PARTIAL MASTECTOMY WITH NEEDLE LOCALIZATION Left 01/04/2018   Procedure: LEFT PARTIAL MASTECTOMY STATUS POST NEEDLE LOCALIZATION;  Surgeon: [name], MD;  Location: [facility];  Service: General;  Laterality: Left;   TEE WITHOUT CARDIOVERSION N/A 10/18/2016   Procedure: TRANSESOPHAGEAL ECHOCARDIOGRAM (TEE);  Surgeon: Arnoldo Lenis, MD;  Location: [facility];  Service: Endoscopy;  Laterality: N/A;   TOTAL ABDOMINAL  HYSTERECTOMY     TOTAL KNEE ARTHROPLASTY Right    TUBAL LIGATION       Allergies  Allergen Reactions   Iodinated Diagnostic Agents Swelling    EYES      Family History  Problem Relation Age of Onset   Hypertension Mother    Heart disease Mother    Kidney disease Father    Stroke Father    Diabetes Sister    Kidney disease Sister    Heart disease Sister    Hypertension Sister    Heart disease Brother    Heart attack Son    Hypertension Son    Cancer Paternal Grandmother        Breast Cancer   Heart disease Brother    Colon cancer Neg Hx    Gastric cancer Neg Hx    Esophageal cancer Neg Hx      Social History Toni [name] reports that she quit smoking about 25 years ago. Her smoking use included cigarettes. She has a 12.50 pack-year smoking history. She quit smokeless tobacco use about 47 years ago.  Her smokeless tobacco use included chew. Toni [name] reports current alcohol use.   Review of Systems CONSTITUTIONAL: No weight loss, fever, chills, weakness or fatigue.  HEENT: Eyes: No visual loss, blurred vision, double vision or yellow sclerae.No hearing loss, sneezing, congestion, runny nose or sore throat.  SKIN: No rash or itching.  CARDIOVASCULAR: per hpi RESPIRATORY: per hpi GASTROINTESTINAL: No anorexia, nausea, vomiting or diarrhea. No abdominal pain or blood.  GENITOURINARY: No burning on urination, no polyuria NEUROLOGICAL: No headache, dizziness, syncope, paralysis, ataxia, numbness or tingling in the extremities. No change in bowel or bladder control.  MUSCULOSKELETAL: No muscle, back pain, joint pain or stiffness.  LYMPHATICS: No enlarged nodes. No history of splenectomy.  PSYCHIATRIC: No history of depression or anxiety.  ENDOCRINOLOGIC: No reports of sweating, cold or heat intolerance. No polyuria or polydipsia.  Marland Kitchen   Physical Examination Today's Vitals   05/01/21 1056  BP: 130/70  Pulse: 66  SpO2: 95%  Weight: 224 lb (101.6 kg)  Height: 5\' 7"   (  1.702 m)   Body mass index is 35.08 kg/m.  Gen: resting comfortably, no acute distress HEENT: no scleral icterus, pupils equal round and reactive, no palptable cervical adenopathy,  CV: RRR, no mr/g, no jvd Resp: Clear to auscultation bilaterally GI: abdomen is soft, non-tender, non-distended, normal bowel sounds, no hepatosplenomegaly MSK: extremities are warm, no edema.  Skin: warm, no rash Neuro:  no focal deficits Psych: appropriate affect   Diagnostic Studies  08/2016 echo Study Conclusions   - Left ventricle: The cavity size was normal. Wall thickness was   increased in a pattern of mild LVH. Systolic function was normal.   The estimated ejection fraction was in the range of 50% to 55%.   Wall motion was normal; there were no regional wall motion   abnormalities. The study was not technically sufficient to allow   evaluation of LV diastolic dysfunction due to atrial   fibrillation. - Aortic valve: Mildly calcified annulus. Trileaflet; mildly   thickened leaflets. There was mild regurgitation. Valve area   (VTI): 2.47 cm^2. Valve area (Vmax): 2.47 cm^2. Valve area   (Vmean): 2.43 cm^2. - Mitral valve: The is a MV anular ring present s/p mitral valve   repair. There is an increased gradient across the MV. Mean   gradient 12 mmHg. - Left atrium: The atrium was severely dilated. - Right ventricle: The cavity size was mildly dilated. Systolic   function was mildly to moderately reduced. - Right atrium: The atrium was mildly dilated. - Tricuspid valve: A TV anular ring is present. Mean gradient   across the TV of 7 mmHg. - Pulmonary arteries: Systolic pressure was mildly increased. PA   peak pressure: 31 mm Hg (S). - Technically adequate study.     10/2016 TEE Study Conclusions   - Left ventricle: The cavity size was normal. Wall thickness was   normal. Systolic function was normal. The estimated ejection   fraction was in the range of 55% to 60%. - Mitral valve:  The posterior leaflet is moderately thickened with   severely restricted motion. There is a moderate to severe   gradient across the MV of 8 mmHg. There was mild regurgitation.   Mean gradient (D): 8 mm Hg. - Left atrium: The atrium was severely dilated. - Right atrium: No evidence of thrombus in the atrial cavity or   appendage.     10/2017 echo Study Conclusions   - Left ventricle: The cavity size was normal. Wall thickness was   increased in a pattern of mild LVH. Systolic function was mildly   reduced. The estimated ejection fraction was in the range of 45%   to 50%. Diffuse hypokinesis. The study is not technically   sufficient to allow evaluation of LV diastolic function. - Aortic valve: Mildly calcified annulus. Trileaflet. Mean gradient   (S): 3 mm Hg. Valve area (VTI): 2.21 cm^2. - Mitral valve: The findings are consistent with severe stenosis.   There was trivial regurgitation. Mean gradient (D): 11 mm Hg.   Planimetered valve area: 0.9 cm^2. Valve area by continuity   equation (using LVOT flow): 0.68 cm^2. - Left atrium: The atrium was severely dilated. - Right ventricle: The cavity size was mildly dilated. Systolic   function was moderately reduced. - Right atrium: The atrium was mildly dilated. Central venous   pressure (est): 15 mm Hg. - Atrial septum: No defect or patent foramen ovale was identified. - Tricuspid valve: There was mild regurgitation. - Pulmonary arteries: PA peak pressure: 58 mm  Hg (S). - Pericardium, extracardiac: There was no pericardial effusion.   Impressions:   - Mild LVH with LVEF approximately 45-50%. Indeterminate diastolic   function. Severe left atrial enlargement. Findings consistent   with severe mitral stenosis as detailed above. Moderately reduced   right ventricular contraction. Mild tricuspid regurgitation with   elevated PASP estimated 58 mmHg.     10/2018 echo Study Conclusions   - Left ventricle: LVEF is depressed at  approximately 40 to 45% with   hypokinesis worse in the inferior, inferoseptal walls. The cavity   size was normal. Wall thickness was normal. Systolic function was   mildly to moderately reduced. The estimated ejection fraction was   in the range of 40% to 45%. - Mitral valve: MV is thickened with restricted motion. Peak and   mean gradients through the valve are 21 and 5 mm Hg MVA by P T1/2   is 1.3 cm2 consistent with severe mitral stenosis Compared to   previous echo in 2018, MVA is smaller. - Left atrium: The atrium was severely dilated. - Right ventricle: Systolic function was mildly reduced. - Right atrium: The atrium was mildly dilated. - Pulmonary arteries: PA peak pressure: 46 mm Hg (S).  10/2020 ABI Summary:  Right: Resting right ankle-brachial index is within normal range. No  evidence of significant right lower extremity arterial disease. The right  toe-brachial index is normal.   Left: Resting left ankle-brachial index is within normal range. No  evidence of significant left lower extremity arterial disease. The left  toe-brachial index is normal.    Assessment and Plan   1. Valvular heart disease/SOB/ Chronic systolic/diastolic HF - Diuretic dosing has been limited by prior Cr elevation. Actually doing well by taking just 20mg  of torsemide 3-4 times a week at this time, we have given her leeway on her diuretic dosing - with some progresion in SOB will repeat ehco, reassess LV function and prior MV repair which has had elevated gradients.      2. History of afib - no clear recurrence since prior MAZE procedure, has not been on anticoag - no symptoms, continue to monitor.    3. HTN - at goal, continue current meds       Arnoldo Lenis, M.D.

## 2021-05-18 ENCOUNTER — Other Ambulatory Visit: Payer: Self-pay

## 2021-05-18 ENCOUNTER — Ambulatory Visit (INDEPENDENT_AMBULATORY_CARE_PROVIDER_SITE_OTHER): Payer: Medicare HMO

## 2021-05-18 DIAGNOSIS — R0602 Shortness of breath: Secondary | ICD-10-CM | POA: Diagnosis not present

## 2021-05-18 LAB — ECHOCARDIOGRAM COMPLETE
Area-P 1/2: 1.13 cm2
Calc EF: 51.6 %
MV VTI: 0.61 cm2
S' Lateral: 4.3 cm
Single Plane A2C EF: 52 %
Single Plane A4C EF: 53.6 %

## 2021-05-21 ENCOUNTER — Other Ambulatory Visit: Payer: Medicare HMO

## 2021-06-01 DIAGNOSIS — J069 Acute upper respiratory infection, unspecified: Secondary | ICD-10-CM | POA: Diagnosis not present

## 2021-06-08 ENCOUNTER — Encounter: Payer: Self-pay | Admitting: Internal Medicine

## 2021-06-11 ENCOUNTER — Telehealth: Payer: Self-pay | Admitting: *Deleted

## 2021-06-11 NOTE — Telephone Encounter (Signed)
-----   Message from Arnoldo Lenis, MD sent at 06/08/2021  4:42 PM EDT ----- Echo overall looks good. Heart pumping function remains normal. The increased pressure across her mitral valve repair is stable from last check and has not progressed   Zandra Abts MD

## 2021-06-11 NOTE — Telephone Encounter (Signed)
Laurine Blazer, LPN  [id number]  [id number] AM EDT Back to Top     Notified, copy to pcp.

## 2021-07-17 DIAGNOSIS — Z79899 Other long term (current) drug therapy: Secondary | ICD-10-CM | POA: Diagnosis not present

## 2021-07-17 DIAGNOSIS — M549 Dorsalgia, unspecified: Secondary | ICD-10-CM | POA: Diagnosis not present

## 2021-07-17 DIAGNOSIS — I10 Essential (primary) hypertension: Secondary | ICD-10-CM | POA: Diagnosis not present

## 2021-07-17 DIAGNOSIS — R350 Frequency of micturition: Secondary | ICD-10-CM | POA: Diagnosis not present

## 2021-07-17 DIAGNOSIS — I5022 Chronic systolic (congestive) heart failure: Secondary | ICD-10-CM | POA: Diagnosis not present

## 2021-07-17 DIAGNOSIS — E559 Vitamin D deficiency, unspecified: Secondary | ICD-10-CM | POA: Diagnosis not present

## 2021-07-17 DIAGNOSIS — N3281 Overactive bladder: Secondary | ICD-10-CM | POA: Diagnosis not present

## 2021-07-17 DIAGNOSIS — N1831 Chronic kidney disease, stage 3a: Secondary | ICD-10-CM | POA: Diagnosis not present

## 2021-07-17 DIAGNOSIS — E785 Hyperlipidemia, unspecified: Secondary | ICD-10-CM | POA: Diagnosis not present

## 2021-07-18 DIAGNOSIS — Z008 Encounter for other general examination: Secondary | ICD-10-CM | POA: Diagnosis not present

## 2021-07-18 DIAGNOSIS — R69 Illness, unspecified: Secondary | ICD-10-CM | POA: Diagnosis not present

## 2021-07-18 DIAGNOSIS — I110 Hypertensive heart disease with heart failure: Secondary | ICD-10-CM | POA: Diagnosis not present

## 2021-07-18 DIAGNOSIS — E261 Secondary hyperaldosteronism: Secondary | ICD-10-CM | POA: Diagnosis not present

## 2021-07-18 DIAGNOSIS — I509 Heart failure, unspecified: Secondary | ICD-10-CM | POA: Diagnosis not present

## 2021-07-18 DIAGNOSIS — G629 Polyneuropathy, unspecified: Secondary | ICD-10-CM | POA: Diagnosis not present

## 2021-07-18 DIAGNOSIS — G8929 Other chronic pain: Secondary | ICD-10-CM | POA: Diagnosis not present

## 2021-07-18 DIAGNOSIS — J9610 Chronic respiratory failure, unspecified whether with hypoxia or hypercapnia: Secondary | ICD-10-CM | POA: Diagnosis not present

## 2021-07-18 DIAGNOSIS — I951 Orthostatic hypotension: Secondary | ICD-10-CM | POA: Diagnosis not present

## 2021-09-01 DIAGNOSIS — J069 Acute upper respiratory infection, unspecified: Secondary | ICD-10-CM | POA: Diagnosis not present

## 2021-09-01 DIAGNOSIS — R109 Unspecified abdominal pain: Secondary | ICD-10-CM | POA: Diagnosis not present

## 2021-09-01 DIAGNOSIS — J029 Acute pharyngitis, unspecified: Secondary | ICD-10-CM | POA: Diagnosis not present

## 2021-09-01 DIAGNOSIS — R0602 Shortness of breath: Secondary | ICD-10-CM | POA: Diagnosis not present

## 2021-09-01 DIAGNOSIS — R110 Nausea: Secondary | ICD-10-CM | POA: Diagnosis not present

## 2021-09-02 ENCOUNTER — Other Ambulatory Visit: Payer: Self-pay

## 2021-09-02 ENCOUNTER — Other Ambulatory Visit (HOSPITAL_COMMUNITY): Payer: Self-pay | Admitting: Family Medicine

## 2021-09-02 ENCOUNTER — Ambulatory Visit (HOSPITAL_COMMUNITY)
Admission: RE | Admit: 2021-09-02 | Discharge: 2021-09-02 | Disposition: A | Discharge status: Home / Self Care | Payer: Medicare HMO | Source: Ambulatory Visit | Attending: Family Medicine | Admitting: Family Medicine

## 2021-09-02 DIAGNOSIS — R0602 Shortness of breath: Secondary | ICD-10-CM | POA: Insufficient documentation

## 2021-09-02 DIAGNOSIS — I517 Cardiomegaly: Secondary | ICD-10-CM | POA: Diagnosis not present

## 2021-09-10 ENCOUNTER — Inpatient Hospital Stay (HOSPITAL_COMMUNITY)
Admission: EM | Admit: 2021-09-10 | Discharge: 2021-10-08 | DRG: 252 | Disposition: E | Payer: Medicare HMO | Attending: Internal Medicine | Admitting: Internal Medicine

## 2021-09-10 ENCOUNTER — Emergency Department (HOSPITAL_COMMUNITY): Payer: Medicare HMO | Admitting: Anesthesiology

## 2021-09-10 ENCOUNTER — Encounter (HOSPITAL_COMMUNITY): Payer: Self-pay

## 2021-09-10 ENCOUNTER — Other Ambulatory Visit: Payer: Self-pay

## 2021-09-10 ENCOUNTER — Encounter (HOSPITAL_COMMUNITY): Admission: EM | Disposition: E | Payer: Self-pay | Source: Home / Self Care | Attending: Vascular Surgery

## 2021-09-10 ENCOUNTER — Emergency Department (HOSPITAL_COMMUNITY): Payer: Medicare HMO

## 2021-09-10 DIAGNOSIS — Z66 Do not resuscitate: Secondary | ICD-10-CM | POA: Diagnosis not present

## 2021-09-10 DIAGNOSIS — R0689 Other abnormalities of breathing: Secondary | ICD-10-CM | POA: Diagnosis not present

## 2021-09-10 DIAGNOSIS — I743 Embolism and thrombosis of arteries of the lower extremities: Secondary | ICD-10-CM | POA: Diagnosis not present

## 2021-09-10 DIAGNOSIS — R001 Bradycardia, unspecified: Secondary | ICD-10-CM | POA: Diagnosis not present

## 2021-09-10 DIAGNOSIS — Z452 Encounter for adjustment and management of vascular access device: Secondary | ICD-10-CM | POA: Diagnosis not present

## 2021-09-10 DIAGNOSIS — I351 Nonrheumatic aortic (valve) insufficiency: Secondary | ICD-10-CM | POA: Diagnosis present

## 2021-09-10 DIAGNOSIS — I513 Intracardiac thrombosis, not elsewhere classified: Secondary | ICD-10-CM | POA: Diagnosis not present

## 2021-09-10 DIAGNOSIS — I480 Paroxysmal atrial fibrillation: Secondary | ICD-10-CM | POA: Diagnosis not present

## 2021-09-10 DIAGNOSIS — I468 Cardiac arrest due to other underlying condition: Secondary | ICD-10-CM | POA: Diagnosis not present

## 2021-09-10 DIAGNOSIS — I5042 Chronic combined systolic (congestive) and diastolic (congestive) heart failure: Secondary | ICD-10-CM | POA: Diagnosis present

## 2021-09-10 DIAGNOSIS — I745 Embolism and thrombosis of iliac artery: Secondary | ICD-10-CM | POA: Diagnosis present

## 2021-09-10 DIAGNOSIS — K219 Gastro-esophageal reflux disease without esophagitis: Secondary | ICD-10-CM | POA: Diagnosis present

## 2021-09-10 DIAGNOSIS — I517 Cardiomegaly: Secondary | ICD-10-CM | POA: Diagnosis not present

## 2021-09-10 DIAGNOSIS — E872 Acidosis, unspecified: Secondary | ICD-10-CM | POA: Diagnosis not present

## 2021-09-10 DIAGNOSIS — F05 Delirium due to known physiological condition: Secondary | ICD-10-CM | POA: Diagnosis present

## 2021-09-10 DIAGNOSIS — Z91041 Radiographic dye allergy status: Secondary | ICD-10-CM

## 2021-09-10 DIAGNOSIS — I471 Supraventricular tachycardia: Secondary | ICD-10-CM | POA: Diagnosis not present

## 2021-09-10 DIAGNOSIS — R680 Hypothermia, not associated with low environmental temperature: Secondary | ICD-10-CM | POA: Diagnosis not present

## 2021-09-10 DIAGNOSIS — I10 Essential (primary) hypertension: Secondary | ICD-10-CM | POA: Diagnosis not present

## 2021-09-10 DIAGNOSIS — J9601 Acute respiratory failure with hypoxia: Secondary | ICD-10-CM | POA: Diagnosis not present

## 2021-09-10 DIAGNOSIS — I700 Atherosclerosis of aorta: Secondary | ICD-10-CM | POA: Diagnosis present

## 2021-09-10 DIAGNOSIS — Z8249 Family history of ischemic heart disease and other diseases of the circulatory system: Secondary | ICD-10-CM

## 2021-09-10 DIAGNOSIS — Z79899 Other long term (current) drug therapy: Secondary | ICD-10-CM

## 2021-09-10 DIAGNOSIS — I70223 Atherosclerosis of native arteries of extremities with rest pain, bilateral legs: Secondary | ICD-10-CM | POA: Diagnosis present

## 2021-09-10 DIAGNOSIS — Z951 Presence of aortocoronary bypass graft: Secondary | ICD-10-CM

## 2021-09-10 DIAGNOSIS — R578 Other shock: Secondary | ICD-10-CM | POA: Diagnosis not present

## 2021-09-10 DIAGNOSIS — Z6837 Body mass index (BMI) 37.0-37.9, adult: Secondary | ICD-10-CM

## 2021-09-10 DIAGNOSIS — Z8673 Personal history of transient ischemic attack (TIA), and cerebral infarction without residual deficits: Secondary | ICD-10-CM

## 2021-09-10 DIAGNOSIS — D65 Disseminated intravascular coagulation [defibrination syndrome]: Secondary | ICD-10-CM | POA: Diagnosis not present

## 2021-09-10 DIAGNOSIS — I2510 Atherosclerotic heart disease of native coronary artery without angina pectoris: Secondary | ICD-10-CM | POA: Diagnosis present

## 2021-09-10 DIAGNOSIS — I70229 Atherosclerosis of native arteries of extremities with rest pain, unspecified extremity: Secondary | ICD-10-CM | POA: Diagnosis not present

## 2021-09-10 DIAGNOSIS — R0902 Hypoxemia: Secondary | ICD-10-CM | POA: Diagnosis not present

## 2021-09-10 DIAGNOSIS — I214 Non-ST elevation (NSTEMI) myocardial infarction: Secondary | ICD-10-CM | POA: Diagnosis not present

## 2021-09-10 DIAGNOSIS — W19XXXA Unspecified fall, initial encounter: Secondary | ICD-10-CM | POA: Diagnosis present

## 2021-09-10 DIAGNOSIS — F418 Other specified anxiety disorders: Secondary | ICD-10-CM | POA: Diagnosis present

## 2021-09-10 DIAGNOSIS — Z515 Encounter for palliative care: Secondary | ICD-10-CM

## 2021-09-10 DIAGNOSIS — Z20822 Contact with and (suspected) exposure to covid-19: Secondary | ICD-10-CM | POA: Diagnosis present

## 2021-09-10 DIAGNOSIS — J90 Pleural effusion, not elsewhere classified: Secondary | ICD-10-CM | POA: Diagnosis not present

## 2021-09-10 DIAGNOSIS — D62 Acute posthemorrhagic anemia: Secondary | ICD-10-CM | POA: Diagnosis not present

## 2021-09-10 DIAGNOSIS — I255 Ischemic cardiomyopathy: Secondary | ICD-10-CM | POA: Diagnosis present

## 2021-09-10 DIAGNOSIS — R570 Cardiogenic shock: Secondary | ICD-10-CM | POA: Diagnosis not present

## 2021-09-10 DIAGNOSIS — I110 Hypertensive heart disease with heart failure: Secondary | ICD-10-CM | POA: Diagnosis present

## 2021-09-10 DIAGNOSIS — M62262 Nontraumatic ischemic infarction of muscle, left lower leg: Secondary | ICD-10-CM | POA: Diagnosis not present

## 2021-09-10 DIAGNOSIS — I70203 Unspecified atherosclerosis of native arteries of extremities, bilateral legs: Secondary | ICD-10-CM | POA: Diagnosis not present

## 2021-09-10 DIAGNOSIS — I420 Dilated cardiomyopathy: Secondary | ICD-10-CM | POA: Diagnosis not present

## 2021-09-10 DIAGNOSIS — Z9049 Acquired absence of other specified parts of digestive tract: Secondary | ICD-10-CM

## 2021-09-10 DIAGNOSIS — M79604 Pain in right leg: Secondary | ICD-10-CM | POA: Diagnosis not present

## 2021-09-10 DIAGNOSIS — R52 Pain, unspecified: Secondary | ICD-10-CM | POA: Diagnosis not present

## 2021-09-10 DIAGNOSIS — Z90710 Acquired absence of both cervix and uterus: Secondary | ICD-10-CM

## 2021-09-10 DIAGNOSIS — Z9012 Acquired absence of left breast and nipple: Secondary | ICD-10-CM

## 2021-09-10 DIAGNOSIS — M109 Gout, unspecified: Secondary | ICD-10-CM | POA: Diagnosis present

## 2021-09-10 DIAGNOSIS — I469 Cardiac arrest, cause unspecified: Secondary | ICD-10-CM | POA: Diagnosis not present

## 2021-09-10 DIAGNOSIS — R9431 Abnormal electrocardiogram [ECG] [EKG]: Secondary | ICD-10-CM

## 2021-09-10 DIAGNOSIS — I130 Hypertensive heart and chronic kidney disease with heart failure and stage 1 through stage 4 chronic kidney disease, or unspecified chronic kidney disease: Secondary | ICD-10-CM | POA: Diagnosis not present

## 2021-09-10 DIAGNOSIS — J811 Chronic pulmonary edema: Secondary | ICD-10-CM | POA: Diagnosis not present

## 2021-09-10 DIAGNOSIS — I8290 Acute embolism and thrombosis of unspecified vein: Secondary | ICD-10-CM | POA: Diagnosis not present

## 2021-09-10 DIAGNOSIS — D631 Anemia in chronic kidney disease: Secondary | ICD-10-CM | POA: Diagnosis not present

## 2021-09-10 DIAGNOSIS — Z7982 Long term (current) use of aspirin: Secondary | ICD-10-CM

## 2021-09-10 DIAGNOSIS — I5082 Biventricular heart failure: Secondary | ICD-10-CM | POA: Diagnosis present

## 2021-09-10 DIAGNOSIS — Z87891 Personal history of nicotine dependence: Secondary | ICD-10-CM

## 2021-09-10 DIAGNOSIS — I5043 Acute on chronic combined systolic (congestive) and diastolic (congestive) heart failure: Secondary | ICD-10-CM | POA: Diagnosis not present

## 2021-09-10 DIAGNOSIS — R202 Paresthesia of skin: Secondary | ICD-10-CM | POA: Diagnosis not present

## 2021-09-10 DIAGNOSIS — Z9911 Dependence on respirator [ventilator] status: Secondary | ICD-10-CM

## 2021-09-10 DIAGNOSIS — M62261 Nontraumatic ischemic infarction of muscle, right lower leg: Secondary | ICD-10-CM | POA: Diagnosis not present

## 2021-09-10 DIAGNOSIS — Z743 Need for continuous supervision: Secondary | ICD-10-CM | POA: Diagnosis not present

## 2021-09-10 DIAGNOSIS — Z952 Presence of prosthetic heart valve: Secondary | ICD-10-CM

## 2021-09-10 DIAGNOSIS — Z96651 Presence of right artificial knee joint: Secondary | ICD-10-CM | POA: Diagnosis present

## 2021-09-10 DIAGNOSIS — N182 Chronic kidney disease, stage 2 (mild): Secondary | ICD-10-CM | POA: Diagnosis not present

## 2021-09-10 DIAGNOSIS — R579 Shock, unspecified: Secondary | ICD-10-CM | POA: Diagnosis not present

## 2021-09-10 HISTORY — PX: APPLICATION OF WOUND VAC: SHX5189

## 2021-09-10 HISTORY — PX: THROMBECTOMY FEMORAL ARTERY: SHX6406

## 2021-09-10 HISTORY — PX: FASCIOTOMY: SHX132

## 2021-09-10 HISTORY — DX: Chronic combined systolic (congestive) and diastolic (congestive) heart failure: I50.42

## 2021-09-10 LAB — CBC WITH DIFFERENTIAL/PLATELET
Abs Immature Granulocytes: 0.05 10*3/uL (ref 0.00–0.07)
Basophils Absolute: 0 10*3/uL (ref 0.0–0.1)
Basophils Relative: 0 %
Eosinophils Absolute: 0.2 10*3/uL (ref 0.0–0.5)
Eosinophils Relative: 1 %
HCT: 38.6 % (ref 36.0–46.0)
Hemoglobin: 12.3 g/dL (ref 12.0–15.0)
Immature Granulocytes: 0 %
Lymphocytes Relative: 18 %
Lymphs Abs: 2.2 10*3/uL (ref 0.7–4.0)
MCH: 32.5 pg (ref 26.0–34.0)
MCHC: 31.9 g/dL (ref 30.0–36.0)
MCV: 102.1 fL — ABNORMAL HIGH (ref 80.0–100.0)
Monocytes Absolute: 0.5 10*3/uL (ref 0.1–1.0)
Monocytes Relative: 4 %
Neutro Abs: 8.9 10*3/uL — ABNORMAL HIGH (ref 1.7–7.7)
Neutrophils Relative %: 77 %
Platelets: 165 10*3/uL (ref 150–400)
RBC: 3.78 MIL/uL — ABNORMAL LOW (ref 3.87–5.11)
RDW: 15.4 % (ref 11.5–15.5)
WBC: 11.8 10*3/uL — ABNORMAL HIGH (ref 4.0–10.5)
nRBC: 0 % (ref 0.0–0.2)

## 2021-09-10 LAB — RESP PANEL BY RT-PCR (FLU A&B, COVID) ARPGX2
Influenza A by PCR: NEGATIVE
Influenza B by PCR: NEGATIVE
SARS Coronavirus 2 by RT PCR: NEGATIVE

## 2021-09-10 LAB — BASIC METABOLIC PANEL
Anion gap: 10 (ref 5–15)
BUN: 25 mg/dL — ABNORMAL HIGH (ref 8–23)
CO2: 18 mmol/L — ABNORMAL LOW (ref 22–32)
Calcium: 9.5 mg/dL (ref 8.9–10.3)
Chloride: 114 mmol/L — ABNORMAL HIGH (ref 98–111)
Creatinine, Ser: 1.14 mg/dL — ABNORMAL HIGH (ref 0.44–1.00)
GFR, Estimated: 53 mL/min — ABNORMAL LOW (ref >60)
Glucose, Bld: 132 mg/dL — ABNORMAL HIGH (ref 70–99)
Potassium: 4.2 mmol/L (ref 3.5–5.1)
Sodium: 142 mmol/L (ref 135–145)

## 2021-09-10 LAB — LACTIC ACID, PLASMA: Lactic Acid, Venous: 2.1 mmol/L (ref 0.5–1.9)

## 2021-09-10 LAB — CBG MONITORING, ED: Glucose-Capillary: 144 mg/dL — ABNORMAL HIGH (ref 70–99)

## 2021-09-10 SURGERY — THROMBECTOMY, ARTERY, FEMORAL
Anesthesia: General | Site: Leg Lower | Laterality: Bilateral

## 2021-09-10 MED ORDER — SUCCINYLCHOLINE CHLORIDE 200 MG/10ML IV SOSY
PREFILLED_SYRINGE | INTRAVENOUS | Status: DC | PRN
  Administered 2021-09-10: 120 mg via INTRAVENOUS

## 2021-09-10 MED ORDER — METHYLPREDNISOLONE SODIUM SUCC 125 MG IJ SOLR
INTRAMUSCULAR | Status: AC
  Administered 2021-09-10: 125 mg via INTRAVENOUS
  Filled 2021-09-10: qty 2

## 2021-09-10 MED ORDER — DIPHENHYDRAMINE HCL 50 MG/ML IJ SOLN
INTRAMUSCULAR | Status: AC
  Administered 2021-09-10: 25 mg via INTRAVENOUS
  Filled 2021-09-10: qty 1

## 2021-09-10 MED ORDER — DIPHENHYDRAMINE HCL 50 MG/ML IJ SOLN: 25.0000 mg | Freq: Once | INTRAMUSCULAR | Status: AC

## 2021-09-10 MED ORDER — FENTANYL CITRATE (PF) 250 MCG/5ML IJ SOLN
INTRAMUSCULAR | Status: DC | PRN
Start: 2021-09-10 — End: 2021-09-11
  Administered 2021-09-10: 100 ug via INTRAVENOUS
  Administered 2021-09-11 (×3): 50 ug via INTRAVENOUS

## 2021-09-10 MED ORDER — EPINEPHRINE 0.3 MG/0.3ML IJ SOAJ
0.3000 mg | Freq: Once | INTRAMUSCULAR | Status: DC
  Filled 2021-09-10: qty 0.3

## 2021-09-10 MED ORDER — IOHEXOL 350 MG/ML SOLN
100.0000 mL | Freq: Once | INTRAVENOUS | Status: AC | PRN
  Administered 2021-09-10: 100 mL via INTRAVENOUS

## 2021-09-10 MED ORDER — LIDOCAINE 2% (20 MG/ML) 5 ML SYRINGE
INTRAMUSCULAR | Status: DC | PRN
  Administered 2021-09-10: 80 mg via INTRAVENOUS

## 2021-09-10 MED ORDER — METHYLPREDNISOLONE SODIUM SUCC 125 MG IJ SOLR: 125.0000 mg | Freq: Once | INTRAMUSCULAR | Status: AC

## 2021-09-10 MED ORDER — PHENYLEPHRINE 40 MCG/ML (10ML) SYRINGE FOR IV PUSH (FOR BLOOD PRESSURE SUPPORT)
PREFILLED_SYRINGE | INTRAVENOUS | Status: DC | PRN
  Administered 2021-09-10: 80 ug via INTRAVENOUS

## 2021-09-10 MED ORDER — HYDROMORPHONE HCL 1 MG/ML IJ SOLN
0.5000 mg | Freq: Once | INTRAMUSCULAR | Status: DC
  Filled 2021-09-10: qty 1

## 2021-09-10 MED ORDER — PROPOFOL 10 MG/ML IV BOLUS
INTRAVENOUS | Status: AC
  Filled 2021-09-10: qty 20

## 2021-09-10 MED ORDER — MORPHINE SULFATE (PF) 4 MG/ML IV SOLN
4.0000 mg | Freq: Once | INTRAVENOUS | Status: AC
  Administered 2021-09-10: 4 mg via INTRAVENOUS
  Filled 2021-09-10: qty 1

## 2021-09-10 MED ORDER — HEPARIN (PORCINE) 25000 UT/250ML-% IV SOLN
1500.0000 [IU]/h | INTRAVENOUS | Status: DC
  Administered 2021-09-10: 1500 [IU]/h via INTRAVENOUS
  Filled 2021-09-10: qty 250

## 2021-09-10 MED ORDER — FENTANYL CITRATE (PF) 250 MCG/5ML IJ SOLN
INTRAMUSCULAR | Status: AC
  Filled 2021-09-10: qty 5

## 2021-09-10 MED ORDER — HEPARIN BOLUS VIA INFUSION
5200.0000 [IU] | Freq: Once | INTRAVENOUS | Status: AC
  Administered 2021-09-10: 5200 [IU] via INTRAVENOUS

## 2021-09-10 MED ORDER — PROPOFOL 10 MG/ML IV BOLUS
INTRAVENOUS | Status: DC | PRN
  Administered 2021-09-10: 50 mg via INTRAVENOUS
  Administered 2021-09-10: 100 mg via INTRAVENOUS

## 2021-09-10 SURGICAL SUPPLY — 62 items
BAG COUNTER SPONGE SURGICOUNT (BAG) ×3 IMPLANT
BAG SURGICOUNT SPONGE COUNTING (BAG) ×1
BANDAGE ESMARK 6X9 LF (GAUZE/BANDAGES/DRESSINGS) IMPLANT
BLADE SURG 15 STRL LF DISP TIS (BLADE) ×2 IMPLANT
BLADE SURG 15 STRL SS (BLADE) ×4
BNDG ELASTIC 4X5.8 VLCR STR LF (GAUZE/BANDAGES/DRESSINGS) IMPLANT
BNDG ESMARK 6X9 LF (GAUZE/BANDAGES/DRESSINGS)
CANISTER SUCT 3000ML PPV (MISCELLANEOUS) ×4 IMPLANT
CANISTER WOUND CARE 500ML ATS (WOUND CARE) ×8 IMPLANT
CATH EMB 4FR 80CM (CATHETERS) ×4 IMPLANT
CLIP VESOCCLUDE MED 24/CT (CLIP) ×4 IMPLANT
CLIP VESOCCLUDE SM WIDE 24/CT (CLIP) ×4 IMPLANT
CUFF TOURN SGL QUICK 24 (TOURNIQUET CUFF)
CUFF TOURN SGL QUICK 34 (TOURNIQUET CUFF)
CUFF TOURN SGL QUICK 42 (TOURNIQUET CUFF) IMPLANT
CUFF TRNQT CYL 24X4X16.5-23 (TOURNIQUET CUFF) IMPLANT
CUFF TRNQT CYL 34X4.125X (TOURNIQUET CUFF) IMPLANT
DERMABOND ADVANCED (GAUZE/BANDAGES/DRESSINGS) ×2
DERMABOND ADVANCED .7 DNX12 (GAUZE/BANDAGES/DRESSINGS) ×2 IMPLANT
DRAIN CHANNEL 15F RND FF W/TCR (WOUND CARE) IMPLANT
DRAPE INCISE IOBAN 66X45 STRL (DRAPES) ×8 IMPLANT
DRAPE X-RAY CASS 24X20 (DRAPES) IMPLANT
DRSG COVADERM 4X8 (GAUZE/BANDAGES/DRESSINGS) ×8 IMPLANT
DRSG VAC ATS LRG SENSATRAC (GAUZE/BANDAGES/DRESSINGS) ×8 IMPLANT
ELECT REM PT RETURN 9FT ADLT (ELECTROSURGICAL) ×4
ELECTRODE REM PT RTRN 9FT ADLT (ELECTROSURGICAL) ×2 IMPLANT
EVACUATOR SILICONE 100CC (DRAIN) IMPLANT
GAUZE 4X4 16PLY ~~LOC~~+RFID DBL (SPONGE) ×4 IMPLANT
GLOVE SURG POLYISO LF SZ7.5 (GLOVE) ×4 IMPLANT
GLOVE SURG UNDER POLY LF SZ7.5 (GLOVE) ×4 IMPLANT
GOWN STRL REUS W/ TWL LRG LVL3 (GOWN DISPOSABLE) ×4 IMPLANT
GOWN STRL REUS W/ TWL XL LVL3 (GOWN DISPOSABLE) ×2 IMPLANT
GOWN STRL REUS W/TWL LRG LVL3 (GOWN DISPOSABLE) ×8
GOWN STRL REUS W/TWL XL LVL3 (GOWN DISPOSABLE) ×4
HEMOSTAT SNOW SURGICEL 2X4 (HEMOSTASIS) IMPLANT
KIT BASIN OR (CUSTOM PROCEDURE TRAY) ×4 IMPLANT
KIT TURNOVER KIT B (KITS) ×4 IMPLANT
MARKER GRAFT CORONARY BYPASS (MISCELLANEOUS) IMPLANT
NS IRRIG 1000ML POUR BTL (IV SOLUTION) ×8 IMPLANT
PACK PERIPHERAL VASCULAR (CUSTOM PROCEDURE TRAY) ×4 IMPLANT
PAD ARMBOARD 7.5X6 YLW CONV (MISCELLANEOUS) ×8 IMPLANT
SET COLLECT BLD 21X3/4 12 (NEEDLE) IMPLANT
SPONGE T-LAP 18X18 ~~LOC~~+RFID (SPONGE) ×16 IMPLANT
STAPLER VISISTAT 35W (STAPLE) ×4 IMPLANT
STOPCOCK 4 WAY LG BORE MALE ST (IV SETS) IMPLANT
SUT ETHILON 3 0 PS 1 (SUTURE) IMPLANT
SUT PROLENE 5 0 C 1 24 (SUTURE) ×8 IMPLANT
SUT PROLENE 6 0 BV (SUTURE) ×12 IMPLANT
SUT PROLENE 7 0 BV 1 (SUTURE) IMPLANT
SUT SILK 2 0 SH (SUTURE) ×4 IMPLANT
SUT SILK 3 0 (SUTURE)
SUT SILK 3-0 18XBRD TIE 12 (SUTURE) IMPLANT
SUT VIC AB 2-0 CT1 27 (SUTURE) ×16
SUT VIC AB 2-0 CT1 TAPERPNT 27 (SUTURE) ×8 IMPLANT
SUT VIC AB 3-0 SH 27 (SUTURE) ×8
SUT VIC AB 3-0 SH 27X BRD (SUTURE) ×4 IMPLANT
SUT VICRYL 4-0 PS2 18IN ABS (SUTURE) ×8 IMPLANT
TOWEL GREEN STERILE (TOWEL DISPOSABLE) ×4 IMPLANT
TRAY FOLEY MTR SLVR 16FR STAT (SET/KITS/TRAYS/PACK) ×4 IMPLANT
TUBING EXTENTION W/L.L. (IV SETS) IMPLANT
UNDERPAD 30X36 HEAVY ABSORB (UNDERPADS AND DIAPERS) ×4 IMPLANT
WATER STERILE IRR 1000ML POUR (IV SOLUTION) ×4 IMPLANT

## 2021-09-10 NOTE — ED Notes (Signed)
Epi-Pen given to care link RN for transport per Dr. Sabra Heck

## 2021-09-10 NOTE — ED Provider Notes (Signed)
  Physical Exam  BP (!) 168/116   Pulse (!) 104   Temp (!) 97.1 F (36.2 C) (Oral)   Resp 18   Ht 5\' 7"  (1.702 m)   Wt 108.9 kg   LMP  (LMP Unknown)   SpO2 95% Comment: 6L  BMI 37.59 kg/m   Physical Exam  ED Course/Procedures     Procedures  MDM   Nursing staff alerted me to come and assess the patient promptly.  Patient was transferred from [facility], ER with critical limb ischemia in the right lower extremity.  Vascular surgery is already on board.  When I went to assess the patient, she reported that her breathing was getting a little bit more labored.  Patient was diaphoretic.   I noticed that the patient was having almost a blank stare, however she responded to her verbal stimuli immediately.  Patient has no focal neurodeficits.  Cranial nerves II through XII intact, upper extremity strength is 4+ out of 5, gross sensory exam over the face and upper extremities is normal.  She is noted to have right lower extremity that is pale.  Given that she has an LV thrombus, one of the thought was that maybe she is having some embolic event to the brain.  However the exam is quite nonfocal at this time, and vascular surgery is wanting to take the patient to the OR immediately in order to salvage the limb.  I think that is the best next step.  Patient probably will benefit with a neuro consultation when she is admitted.  Additionally, patient was given EpiPen for severe allergic reaction to contrast.  O2 sats are normal.  I do not think that is contributing, she will be intubated soon.       Varney Biles, MD 09/12/2021 [phone]

## 2021-09-10 NOTE — Consult Note (Signed)
[facility] OF [place]  ASSESSMENT / PLAN: 65 y.o. female with bilateral lower extremity acute limb ischemia. Right leg has rutherford 2B vs 3 symptoms. Left lower extremity rutherford 2A symptoms.  I counseled her extensively that she has a life and limb threatening event.  The right leg may not be salvageable.  We will plan to perform bilateral lower extremity thrombectomies via femoral approach.  She will also need bilateral 4 compartment fasciotomies.  She is chronically ill and may become acutely ill after revascularization.  She will require admission to the ICU overnight.  CHIEF COMPLAINT: Right worse than left leg weakness  HISTORY OF PRESENT ILLNESS: Toni [name] is a 65 y.o. female who presents as transfer from [facility] for evaluation of bilateral lower extremity acute limb ischemia.  The patient reports she went to use the bathroom earlier today when she developed acute onset of pain in her lower extremities.  She became acutely weak on the right side with numbness and fell to the ground.  She was not able to move her right leg after this.  She was taken to [facility] where evaluation was initiated and the ER team expeditiously worked the patient up for acute limb ischemia.  CT angiogram was performed.  On my evaluation in the [facility], ER, the patient is somewhat delirious.  She is able to focus and answer simple questions.  She appears acutely ill.  She is not able to move her right leg.  She is able to move her left leg.  She has pain in both legs.  VASCULAR SURGICAL HISTORY: None  VASCULAR RISK FACTORS: Negative history of stroke / transient ischemic attack. Positive history of coronary artery disease.  Complex cardiac surgery for mitral insufficiency at [facility]. Negative history of diabetes mellitus. Last A1c 5.4. Positive history of smoking.  Positive history of hypertension.  Negative history of chronic kidney disease.  Last  GFR 54.  Negative history of chronic obstructive pulmonary disease.  FUNCTIONAL STATUS: ECOG performance status: (0) Fully active, able to carry on all predisease performance without restriction Ambulatory status: Ambulatory within the community without limits  Past Medical History:  Diagnosis Date   Congestive heart failure, unspecified    Diastolic heart failure   Degeneration of lumbar or lumbosacral intervertebral disc    Depression    Diverticulosis    Dyspnea     chronic multifactorial large component of deconditioning   Esophageal reflux    Gout    History of kidney stones    Insomnia    Working third shift   Mitral stenosis    Secondary to under-sized ring annuloplasty following mitral valve repair   Mitral valve disorders(424.0)    Mild to moderate mitral regurgitation   Mitral valve insufficiency and aortic valve insufficiency    Morbid obesity ([name])    Normocytic anemia    Other chest pain    Other dyspnea and respiratory abnormality    Paroxysmal atrial fibrillation ([name])    Stable no recurrence   Peripheral edema    chronic   S/P Maze operation for atrial fibrillation 02/27/2015   S/P mitral valve repair 02/27/2015   "radical repair" including resection of P2 segment of posterior leaflet with 26 mm Edwards Physio II annuloplasty ring   S/P tricuspid valve repair 02/27/2015   26 mm annuloplasty ring   Unspecified essential hypertension     Past Surgical History:  Procedure Laterality Date   APPENDECTOMY  AXILLARY LYMPH NODE DISSECTION Left 01/04/2018   Procedure: LEFT AXILLARY LYMPH NODE DISSECTION;  Surgeon: [name], MD;  Location: [facility] ORS;  Service: General;  Laterality: Left;   BREAST BIOPSY Left 2009   CARDIAC VALVE SURGERY  02/27/2015   [facility]. Mitral Valve Repair. Radical Repair with resection of P2 Prolapse, 26 mm Physio II Ring Annuloplasty, Tricuspid Valve Repair (26 mm Ring Annuloplasty, Cox Maze IV Procedure on 02/27/2015    CHOLECYSTECTOMY     COLONOSCOPY  2011   Dr. Laural Golden: 58mm polyp from hepatic flexure (tubular adenoma)   COLONOSCOPY N/A 11/10/2016   Dr. Gala Romney: Diverticulosis. Next colonoscopy 7 years, January 2025   ESOPHAGOGASTRODUODENOSCOPY  2005    Dr. Gala Romney : normal esophagus s/p dilation. Small hiatal hernia noted. Felt improved after dilation.    ESOPHAGOGASTRODUODENOSCOPY N/A 11/10/2016   Dr. Gala Romney: Moderate-sized hiatal hernia, esophagus normal status post empiric dilation for history of dysphagia   HEEL SPUR SURGERY Right    MALONEY DILATION N/A 11/10/2016   Procedure: Venia Minks DILATION;  Surgeon: Daneil Dolin, MD;  Location: [facility] ENDO SUITE;  Service: Endoscopy;  Laterality: N/A;   MINIMALLY INVASIVE TRICUSPID VALVE REPAIR  02/27/2015   [place]   MITRAL VALVE REPAIR  02/27/2015   [place]   PARTIAL MASTECTOMY WITH NEEDLE LOCALIZATION Left 01/04/2018   Procedure: LEFT PARTIAL MASTECTOMY STATUS POST NEEDLE LOCALIZATION;  Surgeon: [name], MD;  Location: [facility] ORS;  Service: General;  Laterality: Left;   TEE WITHOUT CARDIOVERSION N/A 10/18/2016   Procedure: TRANSESOPHAGEAL ECHOCARDIOGRAM (TEE);  Surgeon: Arnoldo Lenis, MD;  Location: [facility] ENDO SUITE;  Service: Endoscopy;  Laterality: N/A;   TOTAL ABDOMINAL HYSTERECTOMY     TOTAL KNEE ARTHROPLASTY Right    TUBAL LIGATION      Family History  Problem Relation Age of Onset   Hypertension Mother    Heart disease Mother    Kidney disease Father    Stroke Father    Diabetes Sister    Kidney disease Sister    Heart disease Sister    Hypertension Sister    Heart disease Brother    Heart attack Son    Hypertension Son    Cancer Paternal Grandmother        Breast Cancer   Heart disease Brother    Colon cancer Neg Hx    Gastric cancer Neg Hx    Esophageal cancer Neg Hx     Social History   Socioeconomic History   Marital status: Single    Spouse name: Not on file   Number of  children: Not on file   Years of education: Not on file   Highest education level: Not on file  Occupational History   Not on file  Tobacco Use   Smoking status: Former    Packs/day: 0.50    Years: 25.00    Pack years: 12.50    Types: Cigarettes    Quit date: 1997    Years since quitting: 25.8   Smokeless tobacco: Former    Types: Chew    Quit date: 1975   Tobacco comments:    dipped tobacco during pregancy in [place] Use   Vaping Use: Never used  Substance and Sexual Activity   Alcohol use: Yes    Comment: a beer occasionally   Drug use: No   Sexual activity: Yes    Partners: Male  Other Topics Concern   Not on file  Social History Narrative  Lives alone or son will stay with her at times. Cooks. Eats well.    Social Determinants of Health   Financial Resource Strain: Not on file  Food Insecurity: Not on file  Transportation Needs: Not on file  Physical Activity: Not on file  Stress: Not on file  Social Connections: Not on file  Intimate Partner Violence: Not on file    Allergies  Allergen Reactions   Iodinated Diagnostic Agents Swelling    EYES    Current Facility-Administered Medications  Medication Dose Route Frequency Provider Last Rate Last Admin   EPINEPHrine (EPI-PEN) injection 0.3 mg  0.3 mg Intramuscular Once Noemi Chapel, MD       heparin ADULT infusion 100 units/mL (25000 units/253mL)  1,500 Units/hr Intravenous Continuous Wendee Beavers, RPH 15 mL/hr at 09/27/2021 2132 1,500 Units/hr at 09/09/2021 2132   HYDROmorphone (DILAUDID) injection 0.5 mg  0.5 mg Intravenous Once Marcello Fennel, PA-C       Current Outpatient Medications  Medication Sig Dispense Refill   acetaminophen (TYLENOL) 500 MG tablet Take 1,000 mg by mouth daily as needed for moderate pain or headache.     albuterol (PROVENTIL HFA;VENTOLIN HFA) 108 (90 Base) MCG/ACT inhaler Inhale 2 puffs into the lungs every 6 (six) hours as needed for wheezing or shortness of breath. 1  Inhaler 3   allopurinol (ZYLOPRIM) 100 MG tablet Take 300 mg by mouth daily.      allopurinol (ZYLOPRIM) 300 MG tablet Take 300 mg by mouth daily.     amLODipine (NORVASC) 5 MG tablet Take 1 tablet (5 mg total) by mouth daily. 90 tablet 1   aspirin EC 81 MG tablet Take 1 tablet (81 mg total) by mouth daily with breakfast. 90 tablet 3   cholecalciferol (VITAMIN D3) 25 MCG (1000 UNIT) tablet Take 1,000 Units by mouth daily.     FLUoxetine (PROZAC) 20 MG capsule Take 1 capsule (20 mg total) by mouth daily. 30 capsule 2   gabapentin (NEURONTIN) 300 MG capsule TAKE 1 CAPSULE BY MOUTH IN THE MORNING AND 2 CAPSULES IN THE EVENING (Patient taking differently: Take 300-600 mg by mouth See admin instructions. 300mg  in the morning and 600mg  in the evening) 270 capsule 0   guaiFENesin (MUCINEX) 600 MG 12 hr tablet Take 600 mg by mouth 2 (two) times daily as needed (congestion).     LINZESS 72 MCG capsule TAKE 1 CAPSULE (72 MCG TOTAL) BY MOUTH DAILY BEFORE BREAKFAST. (Patient taking differently: Take 72 mcg by mouth daily before breakfast.) 30 capsule 11   losartan (COZAAR) 100 MG tablet Take 1 tablet (100 mg total) by mouth daily. 30 tablet 11   magnesium gluconate (MAGONATE) 500 MG tablet Take 500 mg by mouth daily.     metoprolol succinate (TOPROL-XL) 100 MG 24 hr tablet TAKE 1 TABLET BY MOUTH TWICE A DAY 180 tablet 3   pantoprazole (PROTONIX) 40 MG tablet Take 1 tablet (40 mg total) by mouth 2 (two) times daily before a meal. 60 tablet 5   potassium chloride SA (K-DUR) 20 MEQ tablet Take 1 tablet (20 mEq total) by mouth daily. 30 tablet 3   torsemide (DEMADEX) 20 MG tablet TAKE 3 TABLETS ALTERNATING DAYS WITH 2 TABLETS. IF WEIGHT GOES ABOVE 230 LBS THEN TAKE 3 TABLETS DAILY UNTIL BACK DOWN AND THEN RESUME ALTERNATING DAYS WITH 3 TABLETS ONE DAY AND 2 TABLETS THE NEXT 690 tablet 1   trolamine salicylate (ASPERCREME) 10 % cream Apply 1 application topically as needed for muscle  pain.      REVIEW OF SYSTEMS:   Not able to obtain - acuity of condition.  PHYSICAL EXAM Vitals:   10/07/2021 2226 09/30/2021 2230 10/03/2021 2241 09/18/2021 2242  BP: (!) 183/91 (!) 167/113 (!) 168/116   Pulse: 69 (!) 107 (!) 104   Resp: 20 18 18    Temp: (!) 97.1 F (36.2 C)     TempSrc: Oral     SpO2: 92% 93% 95% 95%  Weight:      Height:        Constitutional: Chronically ill appearing. In distress. Appears well3 nourished.  Neurologic: CN intact. No focal findings. RLE insensate with dense motor loss. LLE normal motor / sensory function. Psychiatric:  Mood and affect symmetric and appropriate. Eyes:  No icterus. No conjunctival pallor. Ears, nose, throat:  mucous membranes moist. Midline trachea.  Cardiac: Irregular rate and rhythm.  Respiratory: On 6L [place].  Abdominal: soft, non-tender, non-distended.  Peripheral vascular: No palpable pedal pulses. Extremity: no edema. no cyanosis. no pallor.  Skin: no gangrene. no ulceration.  Lymphatic: no Stemmer's sign. no palpable lymphadenopathy.  PERTINENT LABORATORY AND RADIOLOGIC DATA  Most recent CBC CBC Latest Ref Rng & Units 09/23/2021 07/25/2020 06/18/2019  WBC 4.0 - 10.5 K/uL 11.8(H) 5.4 5.1  Hemoglobin 12.0 - 15.0 g/dL 12.3 11.4(L) 10.4(L)  Hematocrit 36.0 - 46.0 % 38.6 36.4 33.7(L)  Platelets 150 - 400 K/uL 165 124(L) 154     Most recent CMP CMP Latest Ref Rng & Units 09/16/2021 07/25/2020 09/05/2019  Glucose 70 - 99 mg/dL 132(H) 83 71  BUN 8 - 23 mg/dL 25(H) 24(H) 31(H)  Creatinine 0.44 - 1.00 mg/dL 1.14(H) 1.18(H) 1.33(H)  Sodium 135 - 145 mmol/L 142 138 137  Potassium 3.5 - 5.1 mmol/L 4.2 4.0 4.0  Chloride 98 - 111 mmol/L 114(H) 108 105  CO2 22 - 32 mmol/L 18(L) 23 23  Calcium 8.9 - 10.3 mg/dL 9.5 9.3 9.4  Total Protein 6.1 - 8.1 g/dL - - -  Total Bilirubin 0.2 - 1.2 mg/dL - - -  Alkaline Phos 38 - 126 U/L - - -  AST 10 - 35 U/L - - -  ALT 6 - 29 U/L - - -    Renal function Estimated Creatinine Clearance: 60.9 mL/min (A) (by C-G formula based on  SCr of 1.14 mg/dL (H)).  Hgb A1c MFr Bld (% of total Hgb)  Date Value  09/16/2017 5.4    LDL Cholesterol (Calc)  Date Value Ref Range Status  09/16/2017 67 mg/dL (calc) Final    Comment:    Reference range: <100 . Desirable range <100 mg/dL for primary prevention;   <70 mg/dL for patients with CHD or diabetic patients  with > or = 2 CHD risk factors. Marland Kitchen LDL-C is now calculated using the Martin-Hopkins  calculation, which is a validated novel method providing  better accuracy than the Friedewald equation in the  estimation of LDL-C.  Cresenciano Genre et al. Annamaria Helling. 4010;272(53): 2061-2068  (http://education.[employer].com/faq/FAQ164)     CT angiogram reviewed in detail.  She has an LV thrombus which is likely the source of her acute limb ischemia.  She has bilateral occlusive thrombus at the bifurcation the common femoral arteries bilaterally.  On the right side there is no reconstitution of contrast.  On the left side the superficial femoral artery does reconstitute several centimeters distal to the femoral bifurcation.Yevonne Aline Stanford Breed, MD [facility] Phone Number: [phone] 09/17/2021 11:11 PM  Total time spent on preparing this encounter including chart review, data review, collecting history, examining the patient, coordinating care for this new patient, 60 minutes.  Portions of this report may have been transcribed using voice recognition software.  Every effort has been made to ensure accuracy; however, inadvertent computerized transcription errors may still be present.

## 2021-09-10 NOTE — ED Notes (Signed)
Pt resp labored, pt diaphoretic, denies CP, endorses SOB. MD made aware.

## 2021-09-10 NOTE — Anesthesia Preprocedure Evaluation (Addendum)
Anesthesia Evaluation  Patient identified by MRN, date of birth, ID band Patient awake    Reviewed: Allergy & Precautions, NPO status , Patient's Chart, lab work & pertinent test results, reviewed documented beta blocker date and time   Airway Mallampati: II  TM Distance: >3 FB Neck ROM: Full    Dental  (+) Dental Advisory Given, Poor Dentition, Missing   Pulmonary shortness of breath, former smoker,      unstable     Cardiovascular hypertension, Pt. on medications and Pt. on home beta blockers + Peripheral Vascular Disease, +CHF and + DOE  + dysrhythmias Atrial Fibrillation + Valvular Problems/Murmurs (s/p MVR, TVR; Mitral Stenosis) AI and MR  Rhythm:Regular Rate:Tachycardia  Echo 05/18/21: 1. Left ventricular ejection fraction, by estimation, is 50 to 55%. The  left ventricle has low normal function. The left ventricle demonstrates  regional wall motion abnormalities (see scoring diagram/findings for  description). Left ventricular diastolic  parameters are indeterminate.  2. Right ventricular systolic function is moderately reduced. The right  ventricular size is moderately enlarged. There is moderately elevated  pulmonary artery systolic pressure. The estimated right ventricular  systolic pressure is 50.2 mmHg.  3. Left atrial size was severely dilated.  4. The mitral valve is abnormal. Mild mitral valve regurgitation.  Moderate to severe mitral stenosis (moderate by planimetry). The mean  mitral valve gradient is 11.8 mmHg. There is a 26 mm Edwards prosthetic  annuloplasty ring present in the mitral  position.  5. The tricuspid valve is has been repaired/replaced. The tricuspid valve  is status post repair with an annuloplasty ring.  6. The aortic valve is tricuspid. Aortic valve regurgitation is not  visualized.  7. The inferior vena cava is dilated in size with >50% respiratory  variability, suggesting right  atrial pressure of 8 mmHg.    Neuro/Psych PSYCHIATRIC DISORDERS Anxiety Depression negative neurological ROS     GI/Hepatic Neg liver ROS, GERD  Medicated,  Endo/Other  negative endocrine ROSObesity   Renal/GU Renal InsufficiencyRenal disease     Musculoskeletal  (+) Arthritis ,   Abdominal   Peds  Hematology negative hematology ROS (+)   Anesthesia Other Findings Day of surgery medications reviewed with the patient.  Reproductive/Obstetrics                           Anesthesia Physical Anesthesia Plan  ASA: 4 and emergent  Anesthesia Plan: General   Post-op Pain Management:    Induction: Intravenous  PONV Risk Score and Plan: 3 and Dexamethasone and Ondansetron  Airway Management Planned: Oral ETT  Additional Equipment: Arterial line  Intra-op Plan:   Post-operative Plan: Possible Post-op intubation/ventilation  Informed Consent: I have reviewed the patients History and Physical, chart, labs and discussed the procedure including the risks, benefits and alternatives for the proposed anesthesia with the patient or authorized representative who has indicated his/her understanding and acceptance.     Dental advisory given, Only emergency history available and History available from chart only  Plan Discussed with: CRNA  Anesthesia Plan Comments:         Anesthesia Quick Evaluation

## 2021-09-10 NOTE — ED Notes (Signed)
Dr Stanford Breed  Vascular Surgery paged to Dr Sabra Heck @ [phone]

## 2021-09-10 NOTE — ED Notes (Signed)
Pharmacy contacted to verify heparin gtt.

## 2021-09-10 NOTE — ED Triage Notes (Addendum)
Pt BIB [facility] w/ c/o bilateral leg pain. States she went to the bathroom and when she sat down her legs went numb. Pt states she has pain that starts in the feet and radiates to her hips. Pt is unable to move right leg much in triage. Pt is cool and diaphoretic.

## 2021-09-10 NOTE — ED Notes (Signed)
Patient transported to CT with RN 

## 2021-09-10 NOTE — ED Notes (Addendum)
Pt states she cannot feel sensation on right leg from the knee down. Dr. Sabra Heck notified

## 2021-09-10 NOTE — Progress Notes (Signed)
ANTICOAGULATION CONSULT NOTE - Initial Consult  Pharmacy Consult for Heparin Indication: pulmonary embolus  Allergies  Allergen Reactions   Iodinated Diagnostic Agents Swelling    EYES    Patient Measurements: Height: 5\' 7"  (170.2 cm) Weight: 108.9 kg (240 lb) IBW/kg (Calculated) : 61.6 Heparin Dosing Weight: 87 kg  Vital Signs: Temp: 97.5 F (36.4 C) (11/03 2031) BP: 171/95 (11/03 2100) Pulse Rate: 111 (11/03 2100)  Labs: Recent Labs    10/05/2021 2057  HGB 12.3  HCT 38.6  PLT 165    CrCl cannot be calculated (Patient's most recent lab result is older than the maximum 21 days allowed.).   Medical History: Past Medical History:  Diagnosis Date   Congestive heart failure, unspecified    Diastolic heart failure   Degeneration of lumbar or lumbosacral intervertebral disc    Depression    Diverticulosis    Dyspnea     chronic multifactorial large component of deconditioning   Esophageal reflux    Gout    History of kidney stones    Insomnia    Working third shift   Mitral stenosis    Secondary to under-sized ring annuloplasty following mitral valve repair   Mitral valve disorders(424.0)    Mild to moderate mitral regurgitation   Mitral valve insufficiency and aortic valve insufficiency    Morbid obesity ([name])    Normocytic anemia    Other chest pain    Other dyspnea and respiratory abnormality    Paroxysmal atrial fibrillation ([name])    Stable no recurrence   Peripheral edema    chronic   S/P Maze operation for atrial fibrillation 02/27/2015   S/P mitral valve repair 02/27/2015   "radical repair" including resection of P2 segment of posterior leaflet with 26 mm Edwards Physio II annuloplasty ring   S/P tricuspid valve repair 02/27/2015   26 mm annuloplasty ring   Unspecified essential hypertension     Medications:  See Epic.   Patient reports she does not take an anticoagulant /blood thinner except for Aspirin, per RN report.  Assessment: 64 y.o female  presented to [facility] ED with acute onset of pain in her bilateral legs.  She has a history of congestive heart failure, hypertension, history of atrial fibrillation status post Maze procedure, not anticoagulated.  Pharmacy consulted to start Heparin infusion for Pulmonary embolus.  CT angio: Embolic occlusion of the common femoral arteries bilaterally. Left ventricular thrombus, likely the source of lower extremity embolic occlusion. Suspected splenic infarct.  Patient to be transferred to [facility].   Goal of Therapy:  Heparin level 0.3-0.7 units/ml Monitor platelets by anticoagulation protocol: Yes    Plan:  Heparin 5200 units IV bolus (60 unit/kg using adjusted body weight) Heparin drip at 1500 units/hr (17 units/kg/hr) Check 6 hour heparin level Daily heparin level and CBC  Thank you for allowing pharmacy to be part of this patients care team.  Nicole Cella, [place] Pharmacist 09/30/2021,9:23 PM

## 2021-09-10 NOTE — ED Provider Notes (Signed)
[facility] EMERGENCY DEPARTMENT Provider Note   CSN: [id number] Arrival date & time: 10/02/2021  2022     History Chief Complaint  Patient presents with   Leg Pain    Toni [name] is a 65 y.o. female.   Leg Pain  Patient is an ill-appearing 65 year old female, she has a history of congestive heart failure, hypertension, history of atrial fibrillation status post Maze procedure, not anticoagulated.  Reports she was in her usual state of health, went to use the bathroom, when she stood up from using the commode she felt acute onset of pain in her bilateral legs, her right leg went weak and numb and she fell to the ground.  The patient is not able to stand up, and able to move her leg without severe pain.  She denies chest pain shortness of breath fevers or chills.  The patient arrives by paramedic transport tachycardic and in severe discomfort.  His right leg greater than left leg.  Past Medical History:  Diagnosis Date   Congestive heart failure, unspecified    Diastolic heart failure   Degeneration of lumbar or lumbosacral intervertebral disc    Depression    Diverticulosis    Dyspnea     chronic multifactorial large component of deconditioning   Esophageal reflux    Gout    History of kidney stones    Insomnia    Working third shift   Mitral stenosis    Secondary to under-sized ring annuloplasty following mitral valve repair   Mitral valve disorders(424.0)    Mild to moderate mitral regurgitation   Mitral valve insufficiency and aortic valve insufficiency    Morbid obesity ([name])    Normocytic anemia    Other chest pain    Other dyspnea and respiratory abnormality    Paroxysmal atrial fibrillation ([name])    Stable no recurrence   Peripheral edema    chronic   S/P Maze operation for atrial fibrillation 02/27/2015   S/P mitral valve repair 02/27/2015   "radical repair" including resection of P2 segment of posterior leaflet with 26 mm Edwards Physio II annuloplasty ring    S/P tricuspid valve repair 02/27/2015   26 mm annuloplasty ring   Unspecified essential hypertension     Patient Active Problem List   Diagnosis Date Noted   Acute on chronic combined systolic and diastolic heart failure ([place]) 06/18/2019   Anxiety with depression 06/18/2019   Prolonged QT interval 06/18/2019   Elevated alkaline phosphatase level 05/10/2018   Breast mass, left 01/04/2018   Left breast mass    Adenopathy    Upper airway cough syndrome 11/24/2017   Esophageal reflux 11/10/2017   Neuropathic pain 09/16/2017   Anemia of unknown etiology 08/17/2017   Anxiety 08/17/2017   Globus sensation 05/10/2017   PNA (pneumonia) 04/18/2017   CAP (community acquired pneumonia) 04/17/2017   Elevated troponin 04/17/2017   Mitral stenosis    Dysphagia 10/20/2016   Constipation 10/20/2016   S/P mitral valve repair 02/27/2015   S/P Maze operation for atrial fibrillation 02/27/2015   S/P tricuspid valve repair 02/27/2015   Chronic kidney disease, stage II (mild)    Insomnia 09/02/2011   Chronic back pain 09/02/2011   EDEMA 01/18/2011   HYPOKALEMIA 09/17/2010   Normocytic anemia 09/11/2010   Chronic diastolic heart failure ([place]) 08/28/2010   OTH NONSPECIFIC ABNORM CV SYSTEM FUNCTION STUDY 08/11/2010   PRECORDIAL PAIN 07/30/2010   DOE (dyspnea on exertion) 12/26/2009   ACUTE ON CHRONIC DIASTOLIC HEART FAILURE 34/19/6222  MITRAL REGURGITATION 07/07/2009   Essential hypertension 07/07/2009   ATRIAL FIBRILLATION 07/07/2009    Past Surgical History:  Procedure Laterality Date   APPENDECTOMY     AXILLARY LYMPH NODE DISSECTION Left 01/04/2018   Procedure: LEFT AXILLARY LYMPH NODE DISSECTION;  Surgeon: [name], MD;  Location: [facility] ORS;  Service: General;  Laterality: Left;   BREAST BIOPSY Left 2009   CARDIAC VALVE SURGERY  02/27/2015   Mikel Cella. Mitral Valve Repair. Radical Repair with resection of P2 Prolapse, 26 mm Physio II Ring Annuloplasty, Tricuspid Valve Repair (26  mm Ring Annuloplasty, Cox Maze IV Procedure on 02/27/2015   CHOLECYSTECTOMY     COLONOSCOPY  2011   Dr. Laural Golden: 30mm polyp from hepatic flexure (tubular adenoma)   COLONOSCOPY N/A 11/10/2016   Dr. Gala Romney: Diverticulosis. Next colonoscopy 7 years, January 2025   ESOPHAGOGASTRODUODENOSCOPY  2005    Dr. Gala Romney : normal esophagus s/p dilation. Small hiatal hernia noted. Felt improved after dilation.    ESOPHAGOGASTRODUODENOSCOPY N/A 11/10/2016   Dr. Gala Romney: Moderate-sized hiatal hernia, esophagus normal status post empiric dilation for history of dysphagia   HEEL SPUR SURGERY Right    MALONEY DILATION N/A 11/10/2016   Procedure: Venia Minks DILATION;  Surgeon: Daneil Dolin, MD;  Location: [facility] ENDO SUITE;  Service: Endoscopy;  Laterality: N/A;   MINIMALLY INVASIVE TRICUSPID VALVE REPAIR  02/27/2015   [place]   MITRAL VALVE REPAIR  02/27/2015   [facility]   PARTIAL MASTECTOMY WITH NEEDLE LOCALIZATION Left 01/04/2018   Procedure: LEFT PARTIAL MASTECTOMY STATUS POST NEEDLE LOCALIZATION;  Surgeon: [name], MD;  Location: [facility];  Service: General;  Laterality: Left;   TEE WITHOUT CARDIOVERSION N/A 10/18/2016   Procedure: TRANSESOPHAGEAL ECHOCARDIOGRAM (TEE);  Surgeon: Arnoldo Lenis, MD;  Location: [facility];  Service: Endoscopy;  Laterality: N/A;   TOTAL ABDOMINAL HYSTERECTOMY     TOTAL KNEE ARTHROPLASTY Right    TUBAL LIGATION       OB History   No obstetric history on file.     Family History  Problem Relation Age of Onset   Hypertension Mother    Heart disease Mother    Kidney disease Father    Stroke Father    Diabetes Sister    Kidney disease Sister    Heart disease Sister    Hypertension Sister    Heart disease Brother    Heart attack Son    Hypertension Son    Cancer Paternal Grandmother        Breast Cancer   Heart disease Brother    Colon cancer Neg Hx    Gastric cancer Neg Hx    Esophageal cancer Neg Hx      Social History   Tobacco Use   Smoking status: Former    Packs/day: 0.50    Years: 25.00    Pack years: 12.50    Types: Cigarettes    Quit date: 1997    Years since quitting: 25.8   Smokeless tobacco: Former    Types: Chew    Quit date: 1975   Tobacco comments:    dipped tobacco during pregancy in 1975  Vaping Use   Vaping Use: Never used  Substance Use Topics   Alcohol use: Yes    Comment: a beer occasionally   Drug use: No    Home Medications Prior to Admission medications   Medication Sig Start Date End Date Taking? Authorizing Provider  acetaminophen (TYLENOL) 500 MG tablet Take 1,000  mg by mouth daily as needed for moderate pain or headache.    [provider]  albuterol (PROVENTIL HFA;VENTOLIN HFA) 108 (90 Base) MCG/ACT inhaler Inhale 2 puffs into the lungs every 6 (six) hours as needed for wheezing or shortness of breath. 06/29/17   Soyla Dryer, PA-C  allopurinol (ZYLOPRIM) 100 MG tablet Take 300 mg by mouth daily.  05/17/19   [provider]  allopurinol (ZYLOPRIM) 300 MG tablet Take 300 mg by mouth daily. 02/23/21   [provider]  amLODipine (NORVASC) 5 MG tablet Take 1 tablet (5 mg total) by mouth daily. 06/02/18   Caren Macadam, MD  aspirin EC 81 MG tablet Take 1 tablet (81 mg total) by mouth daily with breakfast. 06/20/19   Roxan Hockey, MD  cholecalciferol (VITAMIN D3) 25 MCG (1000 UNIT) tablet Take 1,000 Units by mouth daily.    [provider]  FLUoxetine (PROZAC) 20 MG capsule Take 1 capsule (20 mg total) by mouth daily. 06/20/19 05/01/21  Roxan Hockey, MD  gabapentin (NEURONTIN) 300 MG capsule TAKE 1 CAPSULE BY MOUTH IN THE MORNING AND 2 CAPSULES IN THE EVENING Patient taking differently: Take 300-600 mg by mouth See admin instructions. 300mg  in the morning and 600mg  in the evening 06/21/18   Caren Macadam, MD  guaiFENesin (MUCINEX) 600 MG 12 hr tablet Take 600 mg by mouth 2 (two) times daily as needed  (congestion).    [provider]  LINZESS 72 MCG capsule TAKE 1 CAPSULE (72 MCG TOTAL) BY MOUTH DAILY BEFORE BREAKFAST. Patient taking differently: Take 72 mcg by mouth daily before breakfast. 11/22/18   Annitta Needs, NP  losartan (COZAAR) 100 MG tablet Take 1 tablet (100 mg total) by mouth daily. 11/30/17   Tanda Rockers, MD  magnesium gluconate (MAGONATE) 500 MG tablet Take 500 mg by mouth daily.    [provider]  metoprolol succinate (TOPROL-XL) 100 MG 24 hr tablet TAKE 1 TABLET BY MOUTH TWICE A DAY 10/23/20   Arnoldo Lenis, MD  pantoprazole (PROTONIX) 40 MG tablet Take 1 tablet (40 mg total) by mouth 2 (two) times daily before a meal. 12/10/19   Carlis Stable, NP  potassium chloride SA (K-DUR) 20 MEQ tablet Take 1 tablet (20 mEq total) by mouth daily. 06/20/19   Roxan Hockey, MD  torsemide (DEMADEX) 20 MG tablet TAKE 3 TABLETS ALTERNATING DAYS WITH 2 TABLETS. IF WEIGHT GOES ABOVE 230 LBS THEN TAKE 3 TABLETS DAILY UNTIL BACK DOWN AND THEN RESUME ALTERNATING DAYS WITH 3 TABLETS ONE DAY AND 2 TABLETS THE NEXT 07/10/20   Arnoldo Lenis, MD  trolamine salicylate (ASPERCREME) 10 % cream Apply 1 application topically as needed for muscle pain.    [provider]    Allergies    Iodinated diagnostic agents  Review of Systems   Review of Systems  All other systems reviewed and are negative.  Physical Exam Updated Vital Signs BP (!) 171/95   Pulse (!) 111   Temp (!) 97.5 F (36.4 C)   Resp (!) 26   Ht 1.702 m (5\' 7" )   Wt 108.9 kg   LMP  (LMP Unknown)   SpO2 90%   BMI 37.59 kg/m   Physical Exam Vitals and nursing note reviewed.  Constitutional:      General: She is in acute distress.     Appearance: She is well-developed.  HENT:     Head: Normocephalic and atraumatic.     Mouth/Throat:     Pharynx:  No oropharyngeal exudate.  Eyes:     General: No scleral icterus.       Right eye: No discharge.        Left eye: No discharge.      Conjunctiva/sclera: Conjunctivae normal.     Pupils: Pupils are equal, round, and reactive to light.  Neck:     Thyroid: No thyromegaly.     Vascular: No JVD.  Cardiovascular:     Rate and Rhythm: Regular rhythm. Tachycardia present.     Heart sounds: Normal heart sounds. No murmur heard.   No friction rub. No gallop.  Pulmonary:     Effort: Pulmonary effort is normal. No respiratory distress.     Breath sounds: Normal breath sounds. No wheezing or rales.  Abdominal:     General: Bowel sounds are normal. There is no distension.     Palpations: Abdomen is soft. There is no mass.     Tenderness: There is no abdominal tenderness.  Musculoskeletal:        General: Tenderness present. Normal range of motion.     Cervical back: Normal range of motion and neck supple.     Right lower leg: Edema present.     Left lower leg: Edema present.     Comments: The patient has tenderness in the right lower extremity with manipulation of the leg, she has no palpable pulses and no dopplerable pulses.  Lymphadenopathy:     Cervical: No cervical adenopathy.  Skin:    General: Skin is warm and dry.     Findings: No erythema or rash.  Neurological:     Mental Status: She is alert.     Coordination: Coordination normal.     Comments: Decreased sensation below the level of the knee on the right, left lower extremity has normal sensation, she is able to lift the leg but with some weakness, bilateral upper extremities with normal sensation and strength, cranial nerves III through XII are normal, memory is intact  Psychiatric:        Behavior: Behavior normal.    ED Results / Procedures / Treatments   Labs (all labs ordered are listed, but only abnormal results are displayed) Labs Reviewed  CBC WITH DIFFERENTIAL/PLATELET - Abnormal; Notable for the following components:      Result Value   WBC 11.8 (*)    RBC 3.78 (*)    MCV 102.1 (*)    Neutro Abs 8.9 (*)    All other components within normal  limits  RESP PANEL BY RT-PCR (FLU A&B, COVID) [id number]  BASIC METABOLIC PANEL  LACTIC ACID, PLASMA    EKG EKG Interpretation  Date/Time:  Thursday September 10 2021 20:31:05 EDT Ventricular Rate:  111 PR Interval:  239 QRS Duration: 96 QT Interval:  391 QTC Calculation: 532 R Axis:   80 Text Interpretation: Sinus or ectopic atrial tachycardia Ventricular premature complex Prolonged PR interval Inferior infarct, old Lateral leads are also involved Prolonged QT interval Baseline wander in lead(s) V1 Confirmed by Noemi Chapel [phone]) on 10/07/2021 8:50:02 PM  Radiology No results found.  Procedures .Critical Care Performed by: Noemi Chapel, MD Authorized by: Noemi Chapel, MD   Critical care provider statement:    Critical care time (minutes):  45   Critical care time was exclusive of:  Separately billable procedures and treating other patients   Critical care was necessary to treat or prevent imminent or life-threatening deterioration of the following conditions:  Circulatory failure   Critical care was time  spent personally by me on the following activities:  Development of treatment plan with patient or surrogate, discussions with consultants, evaluation of patient's response to treatment, examination of patient, obtaining history from patient or surrogate, review of old charts, re-evaluation of patient's condition, pulse oximetry, ordering and review of radiographic studies, ordering and review of laboratory studies and ordering and performing treatments and interventions   Care discussed with: admitting provider     Medications Ordered in ED Medications  EPINEPHrine (EPI-PEN) injection 0.3 mg (has no administration in time range)  morphine 4 MG/ML injection 4 mg (4 mg Intravenous Given 09/22/2021 2055)  methylPREDNISolone sodium succinate (SOLU-MEDROL) 125 mg/2 mL injection 125 mg (125 mg Intravenous Given 09/15/2021 2100)  diphenhydrAMINE (BENADRYL) injection 25 mg (25 mg Intravenous  Given 09/27/2021 2101)  iohexol (OMNIPAQUE) 350 MG/ML injection 100 mL (100 mLs Intravenous Contrast Given 09/17/2021 2120)    ED Course  I have reviewed the triage vital signs and the nursing notes.  Pertinent labs & imaging results that were available during my care of the patient were reviewed by me and considered in my medical decision making (see chart for details).    MDM Rules/Calculators/A&P                           The patient does have femoral pulses, she has dopplerable pulses at the bilateral femoral areas.  I have done a bedside Doppler of her right lower extremity and there does appear to be compressible popliteal vein and compressible femoral veins, she does have some cobblestoning of subcutaneous edema up and down the right and left legs.  I discussed the case with Dr. Stanford Breed of the vascular surgery service who states that he will come to see the patient in the emergency department.  He was made aware the patient is at [facility] and will be coming by ambulance after angiogram ordered.  Patient will get heparin at this time as well, she has 2 IVs including a 18-gauge in her left antecubital fossa.  She is tachycardic but not febrile.  CT angiogram does not fact on my interpretation show that the patient has a mid femoral or proximal femoral arterial occlusion with no distal reconstitution that I can detect.  The left side has what appears to be some occlusion but some reconstitution.  The patient is ongoingly tachycardic tachypneic and in pain, she has been given pain medication and now heparin drip.  I discussed the case with the vascular surgeon who has accepted, Dr. Kathrynn Humble in the emergency department is excepted in transfer to the ER at [facility].  The patient is critically ill  The patient did not have any reaction to the contrast dye, EpiPen will be on standby for transport  Final Clinical Impression(s) / ED Diagnoses Final diagnoses:  Critical limb ischemia of both lower  extremities ([place])     Noemi Chapel, MD 09/22/2021 2122

## 2021-09-10 NOTE — ED Notes (Signed)
[facility] arrived to transport pt 

## 2021-09-10 NOTE — ED Notes (Signed)
Dr Miller at bedside. 

## 2021-09-10 NOTE — ED Notes (Signed)
Date and time results received: 09/16/2021 2134  Test: Lactic Acid  Critical Value: 2.1  Name of Provider Notified: Dr. Sabra Heck  Orders Received? Or Actions Taken?: Acknowledged

## 2021-09-11 ENCOUNTER — Inpatient Hospital Stay (HOSPITAL_COMMUNITY): Payer: Medicare HMO

## 2021-09-11 ENCOUNTER — Encounter (HOSPITAL_COMMUNITY): Payer: Self-pay | Admitting: Vascular Surgery

## 2021-09-11 DIAGNOSIS — Z515 Encounter for palliative care: Secondary | ICD-10-CM | POA: Diagnosis not present

## 2021-09-11 DIAGNOSIS — I70229 Atherosclerosis of native arteries of extremities with rest pain, unspecified extremity: Secondary | ICD-10-CM | POA: Diagnosis not present

## 2021-09-11 DIAGNOSIS — I513 Intracardiac thrombosis, not elsewhere classified: Secondary | ICD-10-CM | POA: Diagnosis not present

## 2021-09-11 DIAGNOSIS — I743 Embolism and thrombosis of arteries of the lower extremities: Secondary | ICD-10-CM | POA: Diagnosis not present

## 2021-09-11 DIAGNOSIS — D65 Disseminated intravascular coagulation [defibrination syndrome]: Secondary | ICD-10-CM | POA: Diagnosis not present

## 2021-09-11 DIAGNOSIS — I517 Cardiomegaly: Secondary | ICD-10-CM | POA: Diagnosis not present

## 2021-09-11 DIAGNOSIS — I82890 Acute embolism and thrombosis of other specified veins: Secondary | ICD-10-CM | POA: Diagnosis not present

## 2021-09-11 DIAGNOSIS — J9601 Acute respiratory failure with hypoxia: Secondary | ICD-10-CM | POA: Diagnosis not present

## 2021-09-11 DIAGNOSIS — R570 Cardiogenic shock: Secondary | ICD-10-CM | POA: Diagnosis not present

## 2021-09-11 DIAGNOSIS — Z452 Encounter for adjustment and management of vascular access device: Secondary | ICD-10-CM | POA: Diagnosis not present

## 2021-09-11 DIAGNOSIS — I255 Ischemic cardiomyopathy: Secondary | ICD-10-CM | POA: Diagnosis not present

## 2021-09-11 DIAGNOSIS — I480 Paroxysmal atrial fibrillation: Secondary | ICD-10-CM | POA: Diagnosis not present

## 2021-09-11 DIAGNOSIS — R0689 Other abnormalities of breathing: Secondary | ICD-10-CM | POA: Diagnosis not present

## 2021-09-11 DIAGNOSIS — I70223 Atherosclerosis of native arteries of extremities with rest pain, bilateral legs: Secondary | ICD-10-CM | POA: Diagnosis not present

## 2021-09-11 DIAGNOSIS — I468 Cardiac arrest due to other underlying condition: Secondary | ICD-10-CM | POA: Diagnosis not present

## 2021-09-11 DIAGNOSIS — D62 Acute posthemorrhagic anemia: Secondary | ICD-10-CM | POA: Diagnosis not present

## 2021-09-11 DIAGNOSIS — I469 Cardiac arrest, cause unspecified: Secondary | ICD-10-CM

## 2021-09-11 DIAGNOSIS — I5082 Biventricular heart failure: Secondary | ICD-10-CM

## 2021-09-11 DIAGNOSIS — R579 Shock, unspecified: Secondary | ICD-10-CM

## 2021-09-11 DIAGNOSIS — I8290 Acute embolism and thrombosis of unspecified vein: Secondary | ICD-10-CM | POA: Diagnosis not present

## 2021-09-11 DIAGNOSIS — Z66 Do not resuscitate: Secondary | ICD-10-CM | POA: Diagnosis not present

## 2021-09-11 DIAGNOSIS — R578 Other shock: Secondary | ICD-10-CM | POA: Diagnosis not present

## 2021-09-11 DIAGNOSIS — J90 Pleural effusion, not elsewhere classified: Secondary | ICD-10-CM | POA: Diagnosis not present

## 2021-09-11 DIAGNOSIS — I214 Non-ST elevation (NSTEMI) myocardial infarction: Secondary | ICD-10-CM | POA: Diagnosis not present

## 2021-09-11 DIAGNOSIS — Z20822 Contact with and (suspected) exposure to covid-19: Secondary | ICD-10-CM | POA: Diagnosis not present

## 2021-09-11 LAB — CBC WITH DIFFERENTIAL/PLATELET
Abs Immature Granulocytes: 0.07 K/uL (ref 0.00–0.07)
Abs Immature Granulocytes: 0.36 10*3/uL — ABNORMAL HIGH (ref 0.00–0.07)
Basophils Absolute: 0 K/uL (ref 0.0–0.1)
Basophils Absolute: 0 10*3/uL (ref 0.0–0.1)
Basophils Relative: 0 %
Basophils Relative: 0 %
Eosinophils Absolute: 0 K/uL (ref 0.0–0.5)
Eosinophils Absolute: 0 10*3/uL (ref 0.0–0.5)
Eosinophils Relative: 0 %
Eosinophils Relative: 0 %
HCT: 21.8 % — ABNORMAL LOW (ref 36.0–46.0)
HCT: 28.3 % — ABNORMAL LOW (ref 36.0–46.0)
Hemoglobin: 6.7 g/dL — CL (ref 12.0–15.0)
Hemoglobin: 9.4 g/dL — ABNORMAL LOW (ref 12.0–15.0)
Immature Granulocytes: 1 %
Immature Granulocytes: 2 %
Lymphocytes Relative: 11 %
Lymphocytes Relative: 10 %
Lymphs Abs: 0.8 K/uL (ref 0.7–4.0)
Lymphs Abs: 1.8 10*3/uL (ref 0.7–4.0)
MCH: 31 pg (ref 26.0–34.0)
MCH: 30.5 pg (ref 26.0–34.0)
MCHC: 30.7 g/dL (ref 30.0–36.0)
MCHC: 33.2 g/dL (ref 30.0–36.0)
MCV: 100.9 fL — ABNORMAL HIGH (ref 80.0–100.0)
MCV: 91.9 fL (ref 80.0–100.0)
Monocytes Absolute: 0.1 K/uL (ref 0.1–1.0)
Monocytes Absolute: 0.7 10*3/uL (ref 0.1–1.0)
Monocytes Relative: 1 %
Monocytes Relative: 4 %
Neutro Abs: 7 K/uL (ref 1.7–7.7)
Neutro Abs: 15.4 10*3/uL — ABNORMAL HIGH (ref 1.7–7.7)
Neutrophils Relative %: 87 %
Neutrophils Relative %: 84 %
Platelets: 104 K/uL — ABNORMAL LOW (ref 150–400)
Platelets: 61 10*3/uL — ABNORMAL LOW (ref 150–400)
RBC: 2.16 MIL/uL — ABNORMAL LOW (ref 3.87–5.11)
RBC: 3.08 MIL/uL — ABNORMAL LOW (ref 3.87–5.11)
RDW: 15.4 % (ref 11.5–15.5)
RDW: 16.7 % — ABNORMAL HIGH (ref 11.5–15.5)
WBC: 8 K/uL (ref 4.0–10.5)
WBC: 18.3 10*3/uL — ABNORMAL HIGH (ref 4.0–10.5)
nRBC: 0 % (ref 0.0–0.2)
nRBC: 0.3 % — ABNORMAL HIGH (ref 0.0–0.2)

## 2021-09-11 LAB — POCT I-STAT 7, (LYTES, BLD GAS, ICA,H+H)
Acid-base deficit: 7 mmol/L — ABNORMAL HIGH (ref 0.0–2.0)
Acid-base deficit: 9 mmol/L — ABNORMAL HIGH (ref 0.0–2.0)
Acid-base deficit: 3 mmol/L — ABNORMAL HIGH (ref 0.0–2.0)
Acid-base deficit: 14 mmol/L — ABNORMAL HIGH (ref 0.0–2.0)
Acid-base deficit: 12 mmol/L — ABNORMAL HIGH (ref 0.0–2.0)
Acid-base deficit: 10 mmol/L — ABNORMAL HIGH (ref 0.0–2.0)
Bicarbonate: 18.2 mmol/L — ABNORMAL LOW (ref 20.0–28.0)
Bicarbonate: 19.9 mmol/L — ABNORMAL LOW (ref 20.0–28.0)
Bicarbonate: 21.2 mmol/L (ref 20.0–28.0)
Bicarbonate: 12.3 mmol/L — ABNORMAL LOW (ref 20.0–28.0)
Bicarbonate: 13.2 mmol/L — ABNORMAL LOW (ref 20.0–28.0)
Bicarbonate: 13.7 mmol/L — ABNORMAL LOW (ref 20.0–28.0)
Calcium, Ion: 1.24 mmol/L (ref 1.15–1.40)
Calcium, Ion: 1.27 mmol/L (ref 1.15–1.40)
Calcium, Ion: 1.21 mmol/L (ref 1.15–1.40)
Calcium, Ion: 1.05 mmol/L — ABNORMAL LOW (ref 1.15–1.40)
Calcium, Ion: 1.13 mmol/L — ABNORMAL LOW (ref 1.15–1.40)
Calcium, Ion: 0.99 mmol/L — ABNORMAL LOW (ref 1.15–1.40)
HCT: 26 % — ABNORMAL LOW (ref 36.0–46.0)
HCT: 34 % — ABNORMAL LOW (ref 36.0–46.0)
HCT: 25 % — ABNORMAL LOW (ref 36.0–46.0)
HCT: 27 % — ABNORMAL LOW (ref 36.0–46.0)
HCT: 30 % — ABNORMAL LOW (ref 36.0–46.0)
HCT: 27 % — ABNORMAL LOW (ref 36.0–46.0)
Hemoglobin: 11.6 g/dL — ABNORMAL LOW (ref 12.0–15.0)
Hemoglobin: 8.8 g/dL — ABNORMAL LOW (ref 12.0–15.0)
Hemoglobin: 8.5 g/dL — ABNORMAL LOW (ref 12.0–15.0)
Hemoglobin: 9.2 g/dL — ABNORMAL LOW (ref 12.0–15.0)
Hemoglobin: 10.2 g/dL — ABNORMAL LOW (ref 12.0–15.0)
Hemoglobin: 9.2 g/dL — ABNORMAL LOW (ref 12.0–15.0)
O2 Saturation: 100 %
O2 Saturation: 99 %
O2 Saturation: 99 %
O2 Saturation: 98 %
O2 Saturation: 99 %
O2 Saturation: 98 %
Patient temperature: 35
Patient temperature: 35.6
Patient temperature: 34.5
Patient temperature: 35.3
Patient temperature: 35.8
Potassium: 4.9 mmol/L (ref 3.5–5.1)
Potassium: 5 mmol/L (ref 3.5–5.1)
Potassium: 4.9 mmol/L (ref 3.5–5.1)
Potassium: 4.8 mmol/L (ref 3.5–5.1)
Potassium: 4.7 mmol/L (ref 3.5–5.1)
Potassium: 5.5 mmol/L — ABNORMAL HIGH (ref 3.5–5.1)
Sodium: 143 mmol/L (ref 135–145)
Sodium: 143 mmol/L (ref 135–145)
Sodium: 143 mmol/L (ref 135–145)
Sodium: 140 mmol/L (ref 135–145)
Sodium: 140 mmol/L (ref 135–145)
Sodium: 139 mmol/L (ref 135–145)
TCO2: 19 mmol/L — ABNORMAL LOW (ref 22–32)
TCO2: 21 mmol/L — ABNORMAL LOW (ref 22–32)
TCO2: 22 mmol/L (ref 22–32)
TCO2: 13 mmol/L — ABNORMAL LOW (ref 22–32)
TCO2: 14 mmol/L — ABNORMAL LOW (ref 22–32)
TCO2: 14 mmol/L — ABNORMAL LOW (ref 22–32)
pCO2 arterial: 40.4 mmHg (ref 32.0–48.0)
pCO2 arterial: 42 mmHg (ref 32.0–48.0)
pCO2 arterial: 34.6 mmHg (ref 32.0–48.0)
pCO2 arterial: 27.8 mmHg — ABNORMAL LOW (ref 32.0–48.0)
pCO2 arterial: 26.4 mmHg — ABNORMAL LOW (ref 32.0–48.0)
pCO2 arterial: 22.9 mmHg — ABNORMAL LOW (ref 32.0–48.0)
pH, Arterial: 7.25 — ABNORMAL LOW (ref 7.350–7.450)
pH, Arterial: 7.276 — ABNORMAL LOW (ref 7.350–7.450)
pH, Arterial: 7.396 (ref 7.350–7.450)
pH, Arterial: 7.239 — ABNORMAL LOW (ref 7.350–7.450)
pH, Arterial: 7.297 — ABNORMAL LOW (ref 7.350–7.450)
pH, Arterial: 7.38 (ref 7.350–7.450)
pO2, Arterial: 143 mmHg — ABNORMAL HIGH (ref 83.0–108.0)
pO2, Arterial: 206 mmHg — ABNORMAL HIGH (ref 83.0–108.0)
pO2, Arterial: 129 mmHg — ABNORMAL HIGH (ref 83.0–108.0)
pO2, Arterial: 102 mmHg (ref 83.0–108.0)
pO2, Arterial: 156 mmHg — ABNORMAL HIGH (ref 83.0–108.0)
pO2, Arterial: 101 mmHg (ref 83.0–108.0)

## 2021-09-11 LAB — ECHOCARDIOGRAM LIMITED
Calc EF: 40 %
MV VTI: 0.54 cm2
Single Plane A2C EF: 36.4 %
Single Plane A4C EF: 38.2 %

## 2021-09-11 LAB — GLOBAL TEG PANEL
CFF Max Amplitude: 17.4 mm (ref 15–32)
CK with Heparinase (R): 4.9 min (ref 4.3–8.3)
Citrated Functional Fibrinogen: 317.5 mg/dL (ref 278–581)
Citrated Kaolin (K): 1.8 min (ref 0.8–2.1)
Citrated Kaolin (MA): 55.4 mm (ref 52–69)
Citrated Kaolin (R): 5.8 min (ref 4.6–9.1)
Citrated Kaolin Angle: 70.7 deg (ref 63–78)
Citrated Rapid TEG (MA): 50.2 mm — ABNORMAL LOW (ref 52–70)

## 2021-09-11 LAB — POCT ACTIVATED CLOTTING TIME
Activated Clotting Time: 207 seconds
Activated Clotting Time: 237 seconds

## 2021-09-11 LAB — BRAIN NATRIURETIC PEPTIDE: B Natriuretic Peptide: 2815.7 pg/mL — ABNORMAL HIGH (ref 0.0–100.0)

## 2021-09-11 LAB — DIC (DISSEMINATED INTRAVASCULAR COAGULATION)PANEL
D-Dimer, Quant: 4.21 ug/mL-FEU — ABNORMAL HIGH (ref 0.00–0.50)
Fibrinogen: 166 mg/dL — ABNORMAL LOW (ref 210–475)
INR: 1.6 — ABNORMAL HIGH (ref 0.8–1.2)
Platelets: 88 10*3/uL — ABNORMAL LOW (ref 150–400)
Prothrombin Time: 18.8 seconds — ABNORMAL HIGH (ref 11.4–15.2)
Smear Review: NONE SEEN
aPTT: 52 seconds — ABNORMAL HIGH (ref 24–36)

## 2021-09-11 LAB — COMPREHENSIVE METABOLIC PANEL
ALT: 21 U/L (ref 0–44)
AST: 33 U/L (ref 15–41)
Albumin: 2.2 g/dL — ABNORMAL LOW (ref 3.5–5.0)
Alkaline Phosphatase: 77 U/L (ref 38–126)
Anion gap: 7 (ref 5–15)
BUN: 23 mg/dL (ref 8–23)
CO2: 19 mmol/L — ABNORMAL LOW (ref 22–32)
Calcium: 8.1 mg/dL — ABNORMAL LOW (ref 8.9–10.3)
Chloride: 113 mmol/L — ABNORMAL HIGH (ref 98–111)
Creatinine, Ser: 1.09 mg/dL — ABNORMAL HIGH (ref 0.44–1.00)
GFR, Estimated: 56 mL/min — ABNORMAL LOW (ref >60)
Glucose, Bld: 147 mg/dL — ABNORMAL HIGH (ref 70–99)
Potassium: 4.9 mmol/L (ref 3.5–5.1)
Sodium: 139 mmol/L (ref 135–145)
Total Bilirubin: 0.6 mg/dL (ref 0.3–1.2)
Total Protein: 4.3 g/dL — ABNORMAL LOW (ref 6.5–8.1)

## 2021-09-11 LAB — FIBRINOGEN: Fibrinogen: 179 mg/dL — ABNORMAL LOW (ref 210–475)

## 2021-09-11 LAB — PREPARE RBC (CROSSMATCH)

## 2021-09-11 LAB — GLUCOSE, CAPILLARY
Glucose-Capillary: 105 mg/dL — ABNORMAL HIGH (ref 70–99)
Glucose-Capillary: 250 mg/dL — ABNORMAL HIGH (ref 70–99)
Glucose-Capillary: 232 mg/dL — ABNORMAL HIGH (ref 70–99)

## 2021-09-11 LAB — MRSA NEXT GEN BY PCR, NASAL: MRSA by PCR Next Gen: NOT DETECTED

## 2021-09-11 LAB — HEMOGLOBIN AND HEMATOCRIT, BLOOD
HCT: 26.2 % — ABNORMAL LOW (ref 36.0–46.0)
HCT: 31.9 % — ABNORMAL LOW (ref 36.0–46.0)
Hemoglobin: 8.5 g/dL — ABNORMAL LOW (ref 12.0–15.0)
Hemoglobin: 10.3 g/dL — ABNORMAL LOW (ref 12.0–15.0)

## 2021-09-11 LAB — LACTIC ACID, PLASMA
Lactic Acid, Venous: 5.4 mmol/L (ref 0.5–1.9)
Lactic Acid, Venous: 7.7 mmol/L (ref 0.5–1.9)
Lactic Acid, Venous: 7.2 mmol/L (ref 0.5–1.9)

## 2021-09-11 LAB — TROPONIN I (HIGH SENSITIVITY)
Troponin I (High Sensitivity): 981 ng/L (ref <18)
Troponin I (High Sensitivity): 1860 ng/L
Troponin I (High Sensitivity): 2697 ng/L (ref <18)
Troponin I (High Sensitivity): 3681 ng/L (ref <18)

## 2021-09-11 LAB — LIPID PANEL
Cholesterol: 64 mg/dL (ref 0–200)
HDL: 20 mg/dL — ABNORMAL LOW (ref >40)
LDL Cholesterol: 36 mg/dL (ref 0–99)
Total CHOL/HDL Ratio: 3.2 RATIO
Triglycerides: 41 mg/dL (ref <150)
VLDL: 8 mg/dL (ref 0–40)

## 2021-09-11 LAB — COOXEMETRY PANEL
Carboxyhemoglobin: 0.9 % (ref 0.5–1.5)
Methemoglobin: 1.1 % (ref 0.0–1.5)
O2 Saturation: 34.2 %
Total hemoglobin: 10.2 g/dL — ABNORMAL LOW (ref 12.0–16.0)

## 2021-09-11 LAB — PROTIME-INR
INR: 1.5 — ABNORMAL HIGH (ref 0.8–1.2)
INR: 1.5 — ABNORMAL HIGH (ref 0.8–1.2)
Prothrombin Time: 17.9 seconds — ABNORMAL HIGH (ref 11.4–15.2)
Prothrombin Time: 17.9 seconds — ABNORMAL HIGH (ref 11.4–15.2)

## 2021-09-11 LAB — HEPARIN LEVEL (UNFRACTIONATED): Heparin Unfractionated: 0.4 IU/mL (ref 0.30–0.70)

## 2021-09-11 LAB — ABO/RH: ABO/RH(D): O POS

## 2021-09-11 MED ORDER — POTASSIUM CHLORIDE CRYS ER 20 MEQ PO TBCR: 20.0000 meq | EXTENDED_RELEASE_TABLET | Freq: Every day | ORAL | Status: DC | PRN

## 2021-09-11 MED ORDER — SODIUM CHLORIDE 0.9% IV SOLUTION: Freq: Once | INTRAVENOUS | Status: AC

## 2021-09-11 MED ORDER — SODIUM CHLORIDE 0.9 % IV SOLN
250.0000 mL | INTRAVENOUS | Status: DC
  Administered 2021-09-11: 250 mL via INTRAVENOUS

## 2021-09-11 MED ORDER — ACETAMINOPHEN 650 MG RE SUPP: 650.0000 mg | Freq: Four times a day (QID) | RECTAL | Status: DC | PRN

## 2021-09-11 MED ORDER — SODIUM BICARBONATE 8.4 % IV SOLN
INTRAVENOUS | Status: AC
  Administered 2021-09-11: 100 meq via INTRAVENOUS
  Filled 2021-09-11: qty 100

## 2021-09-11 MED ORDER — GLYCOPYRROLATE 1 MG PO TABS
1.0000 mg | ORAL_TABLET | ORAL | Status: DC | PRN
  Filled 2021-09-11: qty 1

## 2021-09-11 MED ORDER — NOREPINEPHRINE 4 MG/250ML-% IV SOLN
0.0000 ug/min | INTRAVENOUS | Status: DC
  Administered 2021-09-11: 30 ug/min via INTRAVENOUS
  Administered 2021-09-11: 40 ug/min via INTRAVENOUS
  Filled 2021-09-11: qty 250

## 2021-09-11 MED ORDER — POLYVINYL ALCOHOL 1.4 % OP SOLN: 1.0000 [drp] | Freq: Four times a day (QID) | OPHTHALMIC | Status: DC | PRN

## 2021-09-11 MED ORDER — CEFAZOLIN SODIUM-DEXTROSE 2-4 GM/100ML-% IV SOLN
2.0000 g | Freq: Three times a day (TID) | INTRAVENOUS | Status: DC
  Administered 2021-09-11: 2 g via INTRAVENOUS
  Filled 2021-09-11 (×2): qty 100

## 2021-09-11 MED ORDER — GUAIFENESIN-DM 100-10 MG/5ML PO SYRP: 15.0000 mL | ORAL_SOLUTION | ORAL | Status: DC | PRN

## 2021-09-11 MED ORDER — CHLORHEXIDINE GLUCONATE CLOTH 2 % EX PADS
6.0000 | MEDICATED_PAD | Freq: Every day | CUTANEOUS | Status: DC
  Administered 2021-09-11: 6 via TOPICAL

## 2021-09-11 MED ORDER — GLYCOPYRROLATE 0.2 MG/ML IJ SOLN: 0.2000 mg | INTRAMUSCULAR | Status: DC | PRN

## 2021-09-11 MED ORDER — ROCURONIUM BROMIDE 100 MG/10ML IV SOLN
INTRAVENOUS | Status: DC | PRN
  Administered 2021-09-11: 60 mg via INTRAVENOUS
  Administered 2021-09-11: 40 mg via INTRAVENOUS

## 2021-09-11 MED ORDER — HEPARIN (PORCINE) 25000 UT/250ML-% IV SOLN: 800.0000 [IU]/h | INTRAVENOUS | Status: DC

## 2021-09-11 MED ORDER — DOBUTAMINE IN D5W 4-5 MG/ML-% IV SOLN
2.5000 ug/kg/min | INTRAVENOUS | Status: DC
  Administered 2021-09-11: 2.5 ug/kg/min via INTRAVENOUS
  Filled 2021-09-11: qty 250

## 2021-09-11 MED ORDER — PHENOL 1.4 % MT LIQD: 1.0000 | OROMUCOSAL | Status: DC | PRN

## 2021-09-11 MED ORDER — MIDAZOLAM-SODIUM CHLORIDE 100-0.9 MG/100ML-% IV SOLN
0.5000 mg/h | INTRAVENOUS | Status: DC
  Administered 2021-09-11: 0.5 mg/h via INTRAVENOUS
  Filled 2021-09-11: qty 100

## 2021-09-11 MED ORDER — LACTATED RINGERS IV SOLN
INTRAVENOUS | Status: DC
Start: 2021-09-11 — End: 2021-09-12

## 2021-09-11 MED ORDER — CEFAZOLIN SODIUM-DEXTROSE 2-3 GM-%(50ML) IV SOLR
INTRAVENOUS | Status: DC | PRN
  Administered 2021-09-11: 2 g via INTRAVENOUS

## 2021-09-11 MED ORDER — HEPARIN SODIUM (PORCINE) 1000 UNIT/ML IJ SOLN
INTRAMUSCULAR | Status: DC | PRN
  Administered 2021-09-11 (×2): 5000 [IU] via INTRAVENOUS
  Administered 2021-09-11: 3000 [IU] via INTRAVENOUS

## 2021-09-11 MED ORDER — POLYETHYLENE GLYCOL 3350 17 G PO PACK: 17.0000 g | PACK | Freq: Every day | ORAL | Status: DC | PRN

## 2021-09-11 MED ORDER — HYDROMORPHONE HCL 1 MG/ML IJ SOLN: 0.5000 mg | INTRAMUSCULAR | Status: DC | PRN

## 2021-09-11 MED ORDER — HYDRALAZINE HCL 20 MG/ML IJ SOLN: 5.0000 mg | INTRAMUSCULAR | Status: DC | PRN

## 2021-09-11 MED ORDER — MIDAZOLAM HCL 2 MG/2ML IJ SOLN: 1.0000 mg | INTRAMUSCULAR | Status: DC | PRN

## 2021-09-11 MED ORDER — VASOPRESSIN 20 UNITS/100 ML INFUSION FOR SHOCK
0.0000 [IU]/min | INTRAVENOUS | Status: DC
  Administered 2021-09-11: 0.03 [IU]/min via INTRAVENOUS
  Filled 2021-09-11: qty 100

## 2021-09-11 MED ORDER — NOREPINEPHRINE 16 MG/250ML-% IV SOLN
0.0000 ug/min | INTRAVENOUS | Status: DC
  Administered 2021-09-11: 29 ug/min via INTRAVENOUS
  Filled 2021-09-11: qty 250

## 2021-09-11 MED ORDER — EPINEPHRINE 1 MG/10ML IJ SOSY
1.0000 mg | PREFILLED_SYRINGE | Freq: Once | INTRAMUSCULAR | Status: AC
  Administered 2021-09-11: 1 mg via INTRAVENOUS

## 2021-09-11 MED ORDER — ACETAMINOPHEN 10 MG/ML IV SOLN
INTRAVENOUS | Status: AC
  Filled 2021-09-11: qty 100

## 2021-09-11 MED ORDER — PERFLUTREN LIPID MICROSPHERE
1.0000 mL | INTRAVENOUS | Status: AC | PRN
  Administered 2021-09-11: 3 mL via INTRAVENOUS
  Filled 2021-09-11: qty 10

## 2021-09-11 MED ORDER — ACETAMINOPHEN 325 MG RE SUPP: 325.0000 mg | RECTAL | Status: DC | PRN

## 2021-09-11 MED ORDER — HEPARIN SODIUM (PORCINE) 1000 UNIT/ML IJ SOLN
INTRAMUSCULAR | Status: AC
  Filled 2021-09-11: qty 2

## 2021-09-11 MED ORDER — PROPOFOL 1000 MG/100ML IV EMUL
INTRAVENOUS | Status: AC
  Filled 2021-09-11: qty 100

## 2021-09-11 MED ORDER — FENTANYL BOLUS VIA INFUSION
25.0000 ug | INTRAVENOUS | Status: DC | PRN
  Filled 2021-09-11: qty 100

## 2021-09-11 MED ORDER — ORAL CARE MOUTH RINSE: 15.0000 mL | OROMUCOSAL | Status: DC

## 2021-09-11 MED ORDER — PROPOFOL 1000 MG/100ML IV EMUL
5.0000 ug/kg/min | INTRAVENOUS | Status: DC
  Administered 2021-09-11: 25 ug/kg/min via INTRAVENOUS
  Filled 2021-09-11 (×2): qty 100

## 2021-09-11 MED ORDER — PANTOPRAZOLE SODIUM 40 MG IV SOLR
40.0000 mg | Freq: Every day | INTRAVENOUS | Status: DC
  Administered 2021-09-11: 40 mg via INTRAVENOUS
  Filled 2021-09-11: qty 40

## 2021-09-11 MED ORDER — FENTANYL CITRATE PF 50 MCG/ML IJ SOSY
25.0000 ug | PREFILLED_SYRINGE | Freq: Once | INTRAMUSCULAR | Status: AC
  Administered 2021-09-11: 25 ug via INTRAVENOUS

## 2021-09-11 MED ORDER — SODIUM BICARBONATE 8.4 % IV SOLN
INTRAVENOUS | Status: AC
  Filled 2021-09-11: qty 50

## 2021-09-11 MED ORDER — LABETALOL HCL 5 MG/ML IV SOLN: 10.0000 mg | INTRAVENOUS | Status: DC | PRN

## 2021-09-11 MED ORDER — ALUM & MAG HYDROXIDE-SIMETH 200-200-20 MG/5ML PO SUSP: 15.0000 mL | ORAL | Status: DC | PRN

## 2021-09-11 MED ORDER — NOREPINEPHRINE 4 MG/250ML-% IV SOLN
2.0000 ug/min | INTRAVENOUS | Status: DC
Start: 2021-09-11 — End: 2021-09-11
  Administered 2021-09-11: 25 ug/min via INTRAVENOUS
  Administered 2021-09-11: 30 ug/min via INTRAVENOUS
  Filled 2021-09-11 (×2): qty 250

## 2021-09-11 MED ORDER — SODIUM BICARBONATE 8.4 % IV SOLN
INTRAVENOUS | Status: DC | PRN
  Administered 2021-09-11: 50 meq via INTRAVENOUS

## 2021-09-11 MED ORDER — PROPOFOL 500 MG/50ML IV EMUL
INTRAVENOUS | Status: DC | PRN
  Administered 2021-09-11: 3300 ug via INTRAVENOUS

## 2021-09-11 MED ORDER — SODIUM BICARBONATE 8.4 % IV SOLN
INTRAVENOUS | Status: DC
  Filled 2021-09-11 (×2): qty 1000

## 2021-09-11 MED ORDER — ROCURONIUM BROMIDE 10 MG/ML (PF) SYRINGE
PREFILLED_SYRINGE | INTRAVENOUS | Status: AC
  Filled 2021-09-11: qty 10

## 2021-09-11 MED ORDER — BISACODYL 10 MG RE SUPP: 10.0000 mg | Freq: Every day | RECTAL | Status: DC | PRN

## 2021-09-11 MED ORDER — EPHEDRINE SULFATE 50 MG/ML IJ SOLN
INTRAMUSCULAR | Status: DC | PRN
  Administered 2021-09-11 (×2): 5 mg via INTRAVENOUS

## 2021-09-11 MED ORDER — DOCUSATE SODIUM 50 MG/5ML PO LIQD: 100.0000 mg | Freq: Two times a day (BID) | ORAL | Status: DC

## 2021-09-11 MED ORDER — FENTANYL 2500MCG IN NS 250ML (10MCG/ML) PREMIX INFUSION
25.0000 ug/h | INTRAVENOUS | Status: DC
  Administered 2021-09-11: 25 ug/h via INTRAVENOUS
  Filled 2021-09-11: qty 250

## 2021-09-11 MED ORDER — SODIUM CHLORIDE 0.9 % IV SOLN
500.0000 mL | Freq: Once | INTRAVENOUS | Status: AC | PRN
  Administered 2021-09-11: 500 mL via INTRAVENOUS

## 2021-09-11 MED ORDER — EPINEPHRINE 1 MG/10ML IJ SOSY: 0.1000 mg | PREFILLED_SYRINGE | Freq: Once | INTRAMUSCULAR | Status: DC

## 2021-09-11 MED ORDER — POLYETHYLENE GLYCOL 3350 17 G PO PACK: 17.0000 g | PACK | Freq: Every day | ORAL | Status: DC

## 2021-09-11 MED ORDER — EPHEDRINE 5 MG/ML INJ
INTRAVENOUS | Status: AC
  Filled 2021-09-11: qty 5

## 2021-09-11 MED ORDER — METOPROLOL TARTRATE 5 MG/5ML IV SOLN: 2.0000 mg | INTRAVENOUS | Status: DC | PRN

## 2021-09-11 MED ORDER — PHENYLEPHRINE 40 MCG/ML (10ML) SYRINGE FOR IV PUSH (FOR BLOOD PRESSURE SUPPORT)
PREFILLED_SYRINGE | INTRAVENOUS | Status: AC
  Filled 2021-09-11: qty 10

## 2021-09-11 MED ORDER — SODIUM CHLORIDE 0.9% IV SOLUTION: Freq: Once | INTRAVENOUS | Status: DC

## 2021-09-11 MED ORDER — INSULIN ASPART 100 UNIT/ML IJ SOLN
0.0000 [IU] | Freq: Three times a day (TID) | INTRAMUSCULAR | Status: DC
  Administered 2021-09-11 (×2): 5 [IU] via SUBCUTANEOUS

## 2021-09-11 MED ORDER — SUCCINYLCHOLINE CHLORIDE 200 MG/10ML IV SOSY
PREFILLED_SYRINGE | INTRAVENOUS | Status: AC
  Filled 2021-09-11: qty 10

## 2021-09-11 MED ORDER — LACTATED RINGERS IV SOLN: INTRAVENOUS | Status: DC | PRN

## 2021-09-11 MED ORDER — OXYCODONE-ACETAMINOPHEN 5-325 MG PO TABS: 1.0000 | ORAL_TABLET | ORAL | Status: DC | PRN

## 2021-09-11 MED ORDER — MAGNESIUM SULFATE 2 GM/50ML IV SOLN: 2.0000 g | Freq: Every day | INTRAVENOUS | Status: DC | PRN

## 2021-09-11 MED ORDER — MILRINONE LACTATE IN DEXTROSE 20-5 MG/100ML-% IV SOLN: 0.2500 ug/kg/min | INTRAVENOUS | Status: DC

## 2021-09-11 MED ORDER — HEPARIN 6000 UNIT IRRIGATION SOLUTION
Status: DC | PRN
  Administered 2021-09-11: 1

## 2021-09-11 MED ORDER — CHLORHEXIDINE GLUCONATE 0.12% ORAL RINSE (MEDLINE KIT)
15.0000 mL | Freq: Two times a day (BID) | OROMUCOSAL | Status: DC
  Administered 2021-09-11: 15 mL via OROMUCOSAL

## 2021-09-11 MED ORDER — DOCUSATE SODIUM 100 MG PO CAPS: 100.0000 mg | ORAL_CAPSULE | Freq: Every day | ORAL | Status: DC

## 2021-09-11 MED ORDER — LIDOCAINE 2% (20 MG/ML) 5 ML SYRINGE
INTRAMUSCULAR | Status: AC
  Filled 2021-09-11: qty 5

## 2021-09-11 MED ORDER — NOREPINEPHRINE 4 MG/250ML-% IV SOLN
INTRAVENOUS | Status: AC
  Administered 2021-09-11: 30 ug/min via INTRAVENOUS
  Filled 2021-09-11: qty 250

## 2021-09-11 MED ORDER — ACETAMINOPHEN 325 MG PO TABS: 650.0000 mg | ORAL_TABLET | Freq: Four times a day (QID) | ORAL | Status: DC | PRN

## 2021-09-11 MED ORDER — EPHEDRINE 5 MG/ML INJ
INTRAVENOUS | Status: AC
  Filled 2021-09-11: qty 10

## 2021-09-11 MED ORDER — SODIUM BICARBONATE 8.4 % IV SOLN: 100.0000 meq | Freq: Once | INTRAVENOUS | Status: AC

## 2021-09-11 MED ORDER — 0.9 % SODIUM CHLORIDE (POUR BTL) OPTIME
TOPICAL | Status: DC | PRN
  Administered 2021-09-11: 2000 mL

## 2021-09-11 MED ORDER — ACETAMINOPHEN 325 MG PO TABS: 325.0000 mg | ORAL_TABLET | ORAL | Status: DC | PRN

## 2021-09-11 MED ORDER — DIPHENHYDRAMINE HCL 50 MG/ML IJ SOLN: 25.0000 mg | INTRAMUSCULAR | Status: DC | PRN

## 2021-09-12 LAB — PREPARE FRESH FROZEN PLASMA: Unit division: 0

## 2021-09-12 LAB — BPAM FFP
Blood Product Expiration Date: 202211062359
Blood Product Expiration Date: 202211082359
Blood Product Expiration Date: 202211092359
ISSUE DATE / TIME: 202211040738
ISSUE DATE / TIME: 202211040738
Unit Type and Rh: 5100
Unit Type and Rh: 6200
Unit Type and Rh: 6200

## 2021-09-12 LAB — BPAM RBC
Blood Product Expiration Date: 202211212359
Blood Product Expiration Date: 202211272359
Blood Product Expiration Date: 202211282359
Blood Product Expiration Date: 202211282359
Blood Product Expiration Date: 202211282359
Blood Product Expiration Date: 202211282359
ISSUE DATE / TIME: 202211010915
ISSUE DATE / TIME: 202211010915
ISSUE DATE / TIME: 202211040432
ISSUE DATE / TIME: 202211040432
ISSUE DATE / TIME: 202211040554
ISSUE DATE / TIME: 202211040554
Unit Type and Rh: 5100
Unit Type and Rh: 5100
Unit Type and Rh: 5100
Unit Type and Rh: 5100
Unit Type and Rh: 5100
Unit Type and Rh: 5100

## 2021-09-12 LAB — TYPE AND SCREEN
ABO/RH(D): O POS
Antibody Screen: NEGATIVE
Unit division: 0
Unit division: 0
Unit division: 0
Unit division: 0
Unit division: 0
Unit division: 0

## 2021-09-12 LAB — BPAM CRYOPRECIPITATE
Blood Product Expiration Date: 202211041500
ISSUE DATE / TIME: 202211040949
Unit Type and Rh: 5100

## 2021-09-12 LAB — PREPARE CRYOPRECIPITATE: Unit division: 0

## 2021-09-12 LAB — HEMOGLOBIN A1C
Hgb A1c MFr Bld: 5.3 % (ref 4.8–5.6)
Mean Plasma Glucose: 105 mg/dL

## 2021-09-12 LAB — CALCIUM, IONIZED: Calcium, Ionized, Serum: 3.9 mg/dL — ABNORMAL LOW (ref 4.5–5.6)

## 2021-09-14 NOTE — Anesthesia Postprocedure Evaluation (Signed)
Anesthesia Post Note  Patient: DESHAWNDA ACREY  Procedure(s) Performed: BILATERAL THROMBECTOMY ILIO-FEMORAL ARTERY (Bilateral: Groin) BILATERAL LOWER LEG FASCIOTOMY (Bilateral: Leg Lower) APPLICATION OF WOUND VAC BILATERAL LOWER LEGS (Bilateral: Leg Lower)     Patient location during evaluation: SICU Anesthesia Type: General Level of consciousness: sedated Pain management: pain level controlled Vital Signs Assessment: post-procedure vital signs reviewed and stable Respiratory status: patient remains intubated per anesthesia plan Cardiovascular status: stable Postop Assessment: no apparent nausea or vomiting Anesthetic complications: no   No notable events documented.  Last Vitals:  Vitals:   2021/09/15 1700 09-15-21 1715  BP:    Pulse: 90   Resp: (!) 0 (!) 0  Temp:    SpO2: 100%     Last Pain:  Vitals:   2021-09-15 1600  TempSrc:   PainSc: 0-No pain                 Catalina Gravel

## 2021-09-15 LAB — SURGICAL PATHOLOGY

## 2021-09-28 MED FILL — Medication: Qty: 1 | Status: AC

## 2021-10-08 NOTE — Anesthesia Procedure Notes (Signed)
Arterial Line Insertion Start/End11-25-22 12:00 AM, 10-02-2021 12:09 AM Performed by: Josephine Igo, CRNA, CRNA  Patient location: Pre-op. Preanesthetic checklist: patient identified, IV checked, surgical consent, monitors and equipment checked, pre-op evaluation, timeout performed and anesthesia consent Lidocaine 1% used for infiltration Right, radial was placed Catheter size: 20 G Hand hygiene performed  and maximum sterile barriers used   Attempts: 1 Procedure performed without using ultrasound guided technique. Ultrasound Notes:anatomy identified, needle tip was noted to be adjacent to the nerve/plexus identified and no ultrasound evidence of intravascular and/or intraneural injection Following insertion, dressing applied and Biopatch. Post procedure assessment: normal and unchanged

## 2021-10-08 NOTE — Consult Note (Signed)
Cardiology Consultation:   Patient ID: GLORIOUS FLICKER MRN: [id number]; DOB: [birth year]  Admit date: 10/07/2021 Date of Consult: 09-15-2021  PCP:  Gwenlyn Saran, [place] Prov[facility] Cardiologist:  Carlyle Dolly, MD        Patient Profile:   JAHAIRA EARNHART is a 65 y.o. female with a hx of valvular heart disease s/p MV repair and TV repair at [facility] 2016, no significant CAD on preop cath, chronic combined CHF with varying degrees of LV dysfunction, PAF s/p Cox-MAZE procedure at time of valve surgery without known recurrence, HTN, esophageal reflux, obesity who is being seen 15-Sep-2021 for the evaluation of LV thrombus at the request of Dr. Hardin Negus.  History of Present Illness:   Ms. Rossa follows with Dr. Harl Bowie as an outpatient. Per notes she has a history of Radical Repair with resection of P2 Prolapse, 26 mm Physio II Ring Annuloplasty, Tricuspid Valve Repair (26 mm Ring Annuloplasty, Cox Maze IV Procedure on 02/27/2015. Cath prior to surgery showed no significant CAD. She has prior normal ABIs 10/2020 done for leg pain. She has not had any known recurrence of atrial fib since her MAZE procedure and event monitor in 2017 was without atrial fib. She has had varying degrees of LV dysfunction and residual mitral valve disease with EF in the mid 40s-50s over the last few years. Last echo 05/18/21 EF 50-55%, moderately reduced RV function, moderately elevated PASP, severe LAE, mild MR, moderate-severe MS, prior MV/TV rings, no significant AI, dilated IVC.   She presented to [facility] with severe pain/decreased sensation of the right leg and bilateral leg weakness concerning for critical limb ischemia. Bedside doppler of RLE demonstrated compressible popliteal and femoral veins with bilateral subcutaneous edema. Heparin was given. CTA was completed showing embolic occlusion of bilateral CFA without reconstitution of the RLE, some reconstitution of LLE as well  as LV thrombus (likely source of embolic occlusions). She was subsequently transferred to [facility] for VVS evaluation and intervention. She was taken to OR overnight for bilateral iliofemoral thrombectomy and bilateral four compartment fasciotomies (LE WV x 2 placed). Post-operatively the patient was noted to have ongoing issues with hypoxia and was left intubated following surgery. She arrived to the ICU on Levophed and had significant bleeding from her wound vac sites (>1L) so they were removed. Critical care is concerned for hemorrhagic shock, possible PE, and has ordered CT head for stroke as well. She is pending CTA when more stable. As part of her workup yesterday her CT aorta/bifem showed 2.2cm LV thrombus with moderately enlarged LV. She has required multiple blood transfusions. Cardiology is asked to consult for LV thrombus. Shortly before we arrived the patient had a PEA arrest and achieved ROSC with CPR for 1 minute; nursing reports 2 rounds of epi and bicarb. Rhythm on telemetry looked to be NSR/junctional rhythm HR 40s-50s then subsequently post epi she was tachycardic and hypertensive with short runs SVT. Critical care attended her code. Per d/w Dr. Tamala Julian with critical care, he feels patient is acidemic and in DIC. She is also hypothermic down to 93.4*F requiring warming.   Past Medical History:  Diagnosis Date   Chronic combined systolic (congestive) and diastolic (congestive) heart failure ([name])    Diastolic heart failure   Degeneration of lumbar or lumbosacral intervertebral disc    Depression    Diverticulosis    Dyspnea     chronic multifactorial large component of deconditioning   Esophageal reflux    Gout  History of kidney stones    Insomnia    Working third shift   Mitral stenosis    Secondary to under-sized ring annuloplasty following mitral valve repair   Mitral valve disorders(424.0)    Morbid obesity ([name])    Normocytic anemia    Other chest pain    Other dyspnea and  respiratory abnormality    Paroxysmal atrial fibrillation ([place])    remote, prior to MAZE   Peripheral edema    chronic   S/P Maze operation for atrial fibrillation 02/27/2015   S/P mitral valve repair 02/27/2015   "radical repair" including resection of P2 segment of posterior leaflet with 26 mm Edwards Physio II annuloplasty ring   S/P tricuspid valve repair 02/27/2015   26 mm annuloplasty ring   Unspecified essential hypertension     Past Surgical History:  Procedure Laterality Date   APPENDECTOMY     AXILLARY LYMPH NODE DISSECTION Left 01/04/2018   Procedure: LEFT AXILLARY LYMPH NODE DISSECTION;  Surgeon: [name], MD;  Location: [facility]S;  Service: General;  Laterality: Left;   BREAST BIOPSY Left 2009   CARDIAC VALVE SURGERY  02/27/2015   Mikel Cella. Mitral Valve Repair. Radical Repair with resection of P2 Prolapse, 26 mm Physio II Ring Annuloplasty, Tricuspid Valve Repair (26 mm Ring Annuloplasty, Cox Maze IV Procedure on 02/27/2015   CHOLECYSTECTOMY     COLONOSCOPY  2011   Dr. Laural Golden: 71mm polyp from hepatic flexure (tubular adenoma)   COLONOSCOPY N/A 11/10/2016   Dr. Gala Romney: Diverticulosis. Next colonoscopy 7 years, January 2025   ESOPHAGOGASTRODUODENOSCOPY  2005    Dr. Gala Romney : normal esophagus s/p dilation. Small hiatal hernia noted. Felt improved after dilation.    ESOPHAGOGASTRODUODENOSCOPY N/A 11/10/2016   Dr. Gala Romney: Moderate-sized hiatal hernia, esophagus normal status post empiric dilation for history of dysphagia   HEEL SPUR SURGERY Right    MALONEY DILATION N/A 11/10/2016   Procedure: Venia Minks DILATION;  Surgeon: Daneil Dolin, MD;  Location: [facility];  Service: Endoscopy;  Laterality: N/A;   MINIMALLY INVASIVE TRICUSPID VALVE REPAIR  02/27/2015   [place]   MITRAL VALVE REPAIR  02/27/2015   [place]   PARTIAL MASTECTOMY WITH NEEDLE LOCALIZATION Left 01/04/2018   Procedure: LEFT PARTIAL MASTECTOMY STATUS POST  NEEDLE LOCALIZATION;  Surgeon: [name], MD;  Location: [facility];  Service: General;  Laterality: Left;   TEE WITHOUT CARDIOVERSION N/A 10/18/2016   Procedure: TRANSESOPHAGEAL ECHOCARDIOGRAM (TEE);  Surgeon: Arnoldo Lenis, MD;  Location: [facility];  Service: Endoscopy;  Laterality: N/A;   TOTAL ABDOMINAL HYSTERECTOMY     TOTAL KNEE ARTHROPLASTY Right    TUBAL LIGATION       Home Medications:  Prior to Admission medications   Medication Sig Start Date End Date Taking? Authorizing Provider  acetaminophen (TYLENOL) 500 MG tablet Take 1,000 mg by mouth daily as needed for moderate pain or headache.    [provider]  albuterol (PROVENTIL HFA;VENTOLIN HFA) 108 (90 Base) MCG/ACT inhaler Inhale 2 puffs into the lungs every 6 (six) hours as needed for wheezing or shortness of breath. 06/29/17   Soyla Dryer, PA-C  allopurinol (ZYLOPRIM) 100 MG tablet Take 300 mg by mouth daily.  05/17/19   [provider]  allopurinol (ZYLOPRIM) 300 MG tablet Take 300 mg by mouth daily. 02/23/21   [provider]  amLODipine (NORVASC) 5 MG tablet Take 1 tablet (5 mg total) by mouth daily. 06/02/18   Caren Macadam,  MD  aspirin EC 81 MG tablet Take 1 tablet (81 mg total) by mouth daily with breakfast. 06/20/19   Roxan Hockey, MD  cholecalciferol (VITAMIN D3) 25 MCG (1000 UNIT) tablet Take 1,000 Units by mouth daily.    [provider]  FLUoxetine (PROZAC) 20 MG capsule Take 1 capsule (20 mg total) by mouth daily. 06/20/19 05/01/21  Roxan Hockey, MD  gabapentin (NEURONTIN) 300 MG capsule TAKE 1 CAPSULE BY MOUTH IN THE MORNING AND 2 CAPSULES IN THE EVENING Patient taking differently: Take 300-600 mg by mouth See admin instructions. 300mg  in the morning and 600mg  in the evening 06/21/18   Caren Macadam, MD  guaiFENesin (MUCINEX) 600 MG 12 hr tablet Take 600 mg by mouth 2 (two) times daily as needed (congestion).    [provider]  LINZESS 72 MCG capsule  TAKE 1 CAPSULE (72 MCG TOTAL) BY MOUTH DAILY BEFORE BREAKFAST. Patient taking differently: Take 72 mcg by mouth daily before breakfast. 11/22/18   Annitta Needs, NP  losartan (COZAAR) 100 MG tablet Take 1 tablet (100 mg total) by mouth daily. 11/30/17   Tanda Rockers, MD  magnesium gluconate (MAGONATE) 500 MG tablet Take 500 mg by mouth daily.    [provider]  metoprolol succinate (TOPROL-XL) 100 MG 24 hr tablet TAKE 1 TABLET BY MOUTH TWICE A DAY 10/23/20   Arnoldo Lenis, MD  pantoprazole (PROTONIX) 40 MG tablet Take 1 tablet (40 mg total) by mouth 2 (two) times daily before a meal. 12/10/19   Carlis Stable, NP  potassium chloride SA (K-DUR) 20 MEQ tablet Take 1 tablet (20 mEq total) by mouth daily. 06/20/19   Roxan Hockey, MD  torsemide (DEMADEX) 20 MG tablet TAKE 3 TABLETS ALTERNATING DAYS WITH 2 TABLETS. IF WEIGHT GOES ABOVE 230 LBS THEN TAKE 3 TABLETS DAILY UNTIL BACK DOWN AND THEN RESUME ALTERNATING DAYS WITH 3 TABLETS ONE DAY AND 2 TABLETS THE NEXT 07/10/20   Arnoldo Lenis, MD  trolamine salicylate (ASPERCREME) 10 % cream Apply 1 application topically as needed for muscle pain.    [provider]    Inpatient Medications: Scheduled Meds:  sodium chloride   Intravenous Once   sodium chloride   Intravenous Once   sodium chloride   Intravenous Once   [START ON 09/12/2021] docusate sodium  100 mg Oral Daily   EPINEPHrine  0.3 mg Intramuscular Once    HYDROmorphone (DILAUDID) injection  0.5 mg Intravenous Once   insulin aspart  0-15 Units Subcutaneous TID WC   pantoprazole (PROTONIX) IV  40 mg Intravenous Daily   Continuous Infusions:  sodium chloride      ceFAZolin (ANCEF) IV 200 mL/hr at 05-Oct-2021 0611   heparin     lactated ringers Stopped (Oct 05, 2021 0553)   magnesium sulfate bolus IVPB     norepinephrine (LEVOPHED) Adult infusion 25 mcg/min (10/05/2021 0611)   propofol (DIPRIVAN) infusion 25 mcg/kg/min (October 05, 2021 0611)   sodium bicarbonate 150 mEq in D5W  infusion     vasopressin     PRN Meds: acetaminophen **OR** acetaminophen, alum & mag hydroxide-simeth, bisacodyl, guaiFENesin-dextromethorphan, hydrALAZINE, HYDROmorphone (DILAUDID) injection, labetalol, magnesium sulfate bolus IVPB, metoprolol tartrate, oxyCODONE-acetaminophen, phenol, polyethylene glycol, potassium chloride  Allergies:    Allergies  Allergen Reactions   Iodinated Diagnostic Agents Swelling    EYES    Social History:   Social History   Socioeconomic History   Marital status: Single    Spouse name: Not on file   Number of children: Not on  file   Years of education: Not on file   Highest education level: Not on file  Occupational History   Not on file  Tobacco Use   Smoking status: Former    Packs/day: 0.50    Years: 25.00    Pack years: 12.50    Types: Cigarettes    Quit date: 30    Years since quitting: 25.8   Smokeless tobacco: Former    Types: Chew    Quit date: 1975   Tobacco comments:    dipped tobacco during pregancy in [place] Use   Vaping Use: Never used  Substance and Sexual Activity   Alcohol use: Yes    Comment: a beer occasionally   Drug use: No   Sexual activity: Yes    Partners: Male  Other Topics Concern   Not on file  Social History Narrative   Lives alone or son will stay with her at times. Cooks. Eats well.    Social Determinants of Health   Financial Resource Strain: Not on file  Food Insecurity: Not on file  Transportation Needs: Not on file  Physical Activity: Not on file  Stress: Not on file  Social Connections: Not on file  Intimate Partner Violence: Not on file    Family History:    Family History  Problem Relation Age of Onset   Hypertension Mother    Heart disease Mother    Kidney disease Father    Stroke Father    Diabetes Sister    Kidney disease Sister    Heart disease Sister    Hypertension Sister    Heart disease Brother    Heart attack Son    Hypertension Son    Cancer Paternal  Grandmother        Breast Cancer   Heart disease Brother    Colon cancer Neg Hx    Gastric cancer Neg Hx    Esophageal cancer Neg Hx      ROS:  Unable to obtain 2/2 intubation  Physical Exam/Data:   Vitals:   2021/10/06 0615 2021-10-06 0630 2021-10-06 0731 06-Oct-2021 0745  BP:      Pulse: (!) 51 (!) 53 65 (!) 141  Resp: 16 16 (!) 22 (!) 24  Temp:    (!) 94.1 F (34.5 C)  TempSrc:    Core  SpO2: 99% 100% 99% 99%  Weight:      Height:        Intake/Output Summary (Last 24 hours) at 10/06/2021 0810 Last data filed at 10-06-21 0745 Gross per 24 hour  Intake 4874.73 ml  Output 1050 ml  Net 3824.73 ml   Last 3 Weights 09/29/2021 05/01/2021 10/28/2020  Weight (lbs) 240 lb 224 lb 233 lb 12.8 oz  Weight (kg) 108.863 kg 101.606 kg 106.051 kg     Body mass index is 37.59 kg/m.  General: Critically ill AAF intubated/sedated Head: Normocephalic, atraumatic, sclera non-icteric, no xanthomas, nares are without discharge. Neck: Supple, difficult to assess for JVD given neck habitus Lungs: Coarse mechanical BS, intubated.  Heart: Reg rhythm, tachycardic, S1 S2 without murmurs, rubs, or gallops.  Abdomen: Soft, non-tender, non-distended with normoactive bowel sounds. No rebound/guarding. Extremities: Wrapped bilaterally at this time Neuro: Intubated/sedated, does not respond to commands at thist ime Psych:  Unable to assess  EKG:  The EKG was personally reviewed and demonstrates:   - Yesterday: sinus or ectopic atrial tachycardia 11bpm, occasional PVCs, prior inferior infarct, nonspcific STT changes, prolonged QT interval  - Today:  SB 52bpm prior inferior infarct, prolonged QT interval - read as junctional rhythm but just has small P waves  Telemetry:  Telemetry was personally reviewed and demonstrates:  see HPI as above  Relevant CV Studies: 2D echo 05/2021 IMPRESSIONS     1. Left ventricular ejection fraction, by estimation, is 50 to 55%. The  left ventricle has low normal function.  The left ventricle demonstrates  regional wall motion abnormalities (see scoring diagram/findings for  description). Left ventricular diastolic   parameters are indeterminate.   2. Right ventricular systolic function is moderately reduced. The right  ventricular size is moderately enlarged. There is moderately elevated  pulmonary artery systolic pressure. The estimated right ventricular  systolic pressure is 28.4 mmHg.   3. Left atrial size was severely dilated.   4. The mitral valve is abnormal. Mild mitral valve regurgitation.  Moderate to severe mitral stenosis (moderate by planimetry). The mean  mitral valve gradient is 11.8 mmHg. There is a 26 mm Edwards prosthetic  annuloplasty ring present in the mitral  position.   5. The tricuspid valve is has been repaired/replaced. The tricuspid valve  is status post repair with an annuloplasty ring.   6. The aortic valve is tricuspid. Aortic valve regurgitation is not  visualized.   7. The inferior vena cava is dilated in size with >50% respiratory  variability, suggesting right atrial pressure of 8 mmHg.   Comparison(s): Echocardiogram done 06/19/19 showed an EF of 50% with severe  mitral stenosis.   Laboratory Data:  High Sensitivity Troponin:   Recent Labs  Lab 2021/09/14 0304 09-14-21 0502  TROPONINIHS 981* 1,860*     Chemistry Recent Labs  Lab 09/28/2021 2057 14-Sep-2021 0003 2021-09-14 0300 09/14/21 0304 09-14-2021 0726  NA 142   < > 143 139 140  K 4.2   < > 4.9 4.9 4.8  CL 114*  --   --  113*  --   CO2 18*  --   --  19*  --   GLUCOSE 132*  --   --  147*  --   BUN 25*  --   --  23  --   CREATININE 1.14*  --   --  1.09*  --   CALCIUM 9.5  --   --  8.1*  --   GFRNONAA 53*  --   --  56*  --   ANIONGAP 10  --   --  7  --    < > = values in this interval not displayed.    Recent Labs  Lab 14-Sep-2021 0304  PROT 4.3*  ALBUMIN 2.2*  AST 33  ALT 21  ALKPHOS 77  BILITOT 0.6   Lipids  Recent Labs  Lab 09-14-2021 0502  CHOL 64   TRIG 41  HDL 20*  LDLCALC 36  CHOLHDL 3.2    Hematology Recent Labs  Lab 10/07/2021 2057 September 14, 2021 0003 14-Sep-2021 0304 09/14/2021 0502 2021/09/14 0713 Sep 14, 2021 0726  WBC 11.8*  --  8.0  --   --   --   RBC 3.78*  --  2.16*  --   --   --   HGB 12.3   < > 6.7* 8.5* 10.3* 9.2*  HCT 38.6   < > 21.8* 26.2* 31.9* 27.0*  MCV 102.1*  --  100.9*  --   --   --   MCH 32.5  --  31.0  --   --   --   MCHC 31.9  --  30.7  --   --   --  RDW 15.4  --  15.4  --   --   --   PLT 165  --  104*  --  88*  --    < > = values in this interval not displayed.   Thyroid No results for input(s): TSH, FREET4 in the last 168 hours.  BNP Recent Labs  Lab 09/21/2021 0502  BNP 2,815.7*    DDimer  Recent Labs  Lab September 21, 2021 0713  DDIMER 4.21*     Radiology/Studies:  CT Angio Aortobifemoral W and/or Wo Contrast  Result Date: 09/26/2021 CLINICAL DATA:  Loss of pedal pulses, lower extremity paresthesia EXAM: CT ANGIOGRAPHY OF ABDOMINAL AORTA WITH ILIOFEMORAL RUNOFF TECHNIQUE: Multidetector CT imaging of the abdomen, pelvis and lower extremities was performed using the standard protocol during bolus administration of intravenous contrast. Multiplanar CT image reconstructions and MIPs were obtained to evaluate the vascular anatomy. CONTRAST:  120mL OMNIPAQUE IOHEXOL 350 MG/ML SOLN COMPARISON:  None. FINDINGS: VASCULAR Aorta: Moderate atherosclerotic calcification. No aneurysm or dissection. No periaortic inflammatory change. Celiac: Patent without evidence of aneurysm, dissection, vasculitis or significant stenosis. SMA: Patent without evidence of aneurysm, dissection, vasculitis or significant stenosis. Renals: Both renal arteries are patent without evidence of aneurysm, dissection, vasculitis, fibromuscular dysplasia or significant stenosis. IMA: Patent without evidence of aneurysm, dissection, vasculitis or significant stenosis. RIGHT Lower Extremity Inflow: Mild atherosclerotic calcification. No hemodynamically  significant stenosis. No aneurysm or dissection. Internal iliac artery is patent. Outflow: There is an abrupt occlusion of the right common femoral artery demonstrating a meniscus better. Pre shaded on coronal image # 75/8 compatible with enema Liss. There is occlusion of the proximal superficial femoral artery at its origin. Distally, the vessel is not opacified. There is nonocclusive thrombus within the proximal profundus femoral artery. There is no significant collateralization identified to the lower extremity runoff. Runoff: Non-opacified. LEFT Lower Extremity Inflow: Minimal atherosclerotic calcification. No hemodynamically significant stenosis. No aneurysm or dissection. Internal iliac arteries patent. Outflow: There is abrupt occlusion of the common femoral artery demonstrating a meniscus compatible with an embolus with extension and complete occlusion of the superficial femoral artery and profundus femoral artery origins. The vessels are quickly reconstituted thereafter with patency of the superficial femoral artery and profundus femoral artery distally Runoff: The popliteal artery is patent. There is opacification of the proximal tibioperoneal trunk and anterior tibial artery though distal opacification is poor, likely related to bolus timing. Veins: No obvious venous abnormality within the limitations of this arterial phase study. Review of the MIP images confirms the above findings. NON-VASCULAR Lower chest: A 2.2 cm thrombus is seen within the left ventricle. Cardiac size is moderately globally enlarged. Tricuspid and mitral valve replacement have been performed. Bibasilar interstitial pulmonary infiltrates likely reflect mild interstitial pulmonary edema. Small right pleural effusion is present. Hepatobiliary: No focal liver abnormality is seen. Status post cholecystectomy. No biliary dilatation. Mild periportal edema is present, nonspecific. Pancreas: Unremarkable Spleen: There is sharply marginated,  wedge shaped hypoattenuation of the spleen likely reflecting a splenic infarct despite the relatively early phase of contrast enhancement. This comprises 25-50% of the splenic parenchyma. Adrenals/Urinary Tract: The adrenal glands are unremarkable. The kidneys are normal in size and position. Mild bilateral renal cortical scarring. The bladder is unremarkable. Stomach/Bowel: The stomach, small bowel, and large bowel are unremarkable save for mild sigmoid diverticulosis. The appendix is not visualized and is likely absent. No free intraperitoneal gas or fluid. Lymphatic: No pathologic adenopathy within the abdomen and pelvis. Reproductive: Status post hysterectomy. No adnexal masses.  Other: No abdominal wall hernia.  Rectum unremarkable. Musculoskeletal: No acute bone abnormality. Degenerative changes are seen within the lumbar spine. IMPRESSION: VASCULAR Embolic occlusion of the common femoral arteries bilaterally. A non opacification of the right superficial femoral artery may relate to thrombus or poor collateralization. No significant reconstitution of the right lower extremity runoff. On the left, lower extremity arterial outflow is reconstituted in there is patency of the left lower extremity runoff proximally though the distal runoff is not opacified, likely related to slow flow and resultant suboptimal bolus timing. NON-VASCULAR Left ventricular thrombus, likely the source of lower extremity embolic occlusion. Moderate global cardiomegaly. At least mild interstitial pulmonary edema likely reflecting cardiogenic failure. Small right pleural effusion. Suspected splenic infarct. Electronically Signed   By: Fidela Salisbury M.D.   On: 09/28/2021 21:57   DG CHEST PORT 1 VIEW  Result Date: September 27, 2021 CLINICAL DATA:  Central line placement film EXAM: PORTABLE CHEST 1 VIEW COMPARISON:  Portable chest 09/02/2021 sec FINDINGS: The heart is enlarged. There is perihilar vascular congestion, mild generalized  interstitial edema with small pleural effusions, and perihilar opacities which are probably due to airspace edema, less likely pneumonia. The findings are increased compared with the prior study. The aortic arch is heavily calcified and sternotomy sutures and prosthetic cardiac valves are again noted, multiple left axillary surgical clips. Endotracheal tube is in place with the tip 2.5 cm above the carina, and right IJ central line is newly noted with the tip in the upper right atrium. There is no pneumothorax. IMPRESSION: 1. Right IJ central line tip in the upper right atrium. 2. ETT tip 2.5 cm from the carina. 3. Cardiomegaly with findings of CHF or fluid overload with interstitial edema and perihilar opacities. Small pleural effusions. Electronically Signed   By: Telford Nab M.D.   On: 09/27/2021 04:41     Assessment and Plan:   1. BLE critical limb ischemia s/p bilateral right iliofemoral thrombectomy and four compartment calf fasciotomy complicated by the following: - acute blood loss anemia with suspected DIC/hemorrhagic shock - acidemia - ventilator dependent respiratory failure - hypothermia - PEA arrest likely due to multiple physiologic stressors - PE suspected per PCCM as well - CT head, CT angio chest pending  2. Suspected left ventricular thrombus by CTA 09/19/2021 - stat 2D echo pending - have discussed with Dr. Harriet Masson acutely - tentatively we do not anticipate patient is a candidate for any invasive mechanical interventions given her acute instability, will likely need to be continued ongoing multidisciplinary management with PCCM, vascular surgery, cardiology - on IV heparin per pharmacy at this time, but hospital course complicated by bleeding as well - very tenuous, prognosis guarded  3. QT prolongation - at baseline has hx of prolonged QT by prior EKG; increase is likely sequelae of metabolic demands - IM managing labs/lytes - avoid additional QT prolonging agents - continue  to follow for now  4. NSTEMI with recent troponin 1860  - unclear if this represents concomitant ACS or type 2 MI - on heparin as above - not on BB due to requiring pressors - consider statin when taking orals  5. Chronic combined CHF prior variable LV dysfunction - repeat echo pending  6. Valvular heart disease with prior MV/TV repair 2016 with residual moderate-severe mitral stenosis - repeat echo pending  7. Remote hx of PAF  - repeat EKG post arrest   Risk Assessment/Risk Scores:     TIMI Risk Score for Unstable Angina or Non-ST Elevation MI:  The patient's TIMI risk score is 3, which indicates a 13% risk of all cause mortality, new or recurrent myocardial infarction or need for urgent revascularization in the next 14 days.  New York Heart Association (NYHA) Functional Class Unable to assess ambulatory sx but given intubation will deem class IV for now    CHA2DS2-VASc Score =     This indicates a  % annual risk of stroke. The patient's score is based upon: CHF History: 1 HTN History: 1 Stroke History: 2 - embolism this admission Vascular Disease History: 1 - possible MI this admission Age Score: 1 Gender Score: 1     For questions or updates, please contact [place] Please consult [email or web address] for contact info under    Signed, Charlie Pitter, PA-C  2021/09/15 8:10 AM

## 2021-10-08 NOTE — Progress Notes (Addendum)
[facility] [place]    Assessment/Plan: 65 y.o. female with bilateral lower extremity acute limb ischemia.   S/P   PROCEDURE:   1) right iliofemoral thrombectomy via right femoral artery exposure 2) left iliofemoral thrombectomy via left femoral artery exposure 3) right four compartment calf fasciotomy 4) left four compartment calf fasciotomy  Hypoxia intubated, elevated lactic acid 5.4  Urine OP 650 cc, Cr stable 1.09 PE arrest this am CODE blue Return of spontaneous circulation was achieved.  LV thrombus Heparin was held acute blood loss anemia with EBL 400 ml Transfusing 1 unit PRBC Plan to restart Heparin slowly and check CBC   Subjective  - intubate critically ill    Objective (!) 165/114 65 (!) 93.4 F (34.1 C) (Esophageal) (!) 22 99%  Intake/Output Summary (Last 24 hours) at 10/01/21 0741 Last data filed at 10/01/2021 0174 Gross per 24 hour  Intake 4559.73 ml  Output 1050 ml  Net 3509.73 ml    B peroneal doppler signals  Continued fasciotomy blood loss outer dressings changed at bedside Left groin with bloody drainage, B groins soft without hematoma Lungs intubated and sedated  Roxy Horseman 01-Oct-2021 7:41 AM --  Laboratory Lab Results: Recent Labs    10/01/2021 2057 10/01/21 0003 01-Oct-2021 0304 01-Oct-2021 0502 2021/10/01 0726  WBC 11.8*  --  8.0  --   --   HGB 12.3   < > 6.7* 8.5* 9.2*  HCT 38.6   < > 21.8* 26.2* 27.0*  PLT 165  --  104*  --   --    < > = values in this interval not displayed.   BMET Recent Labs    09/21/2021 2057 10-01-2021 0003 10-01-21 0304 2021-10-01 0726  NA 142   < > 139 140  K 4.2   < > 4.9 4.8  CL 114*  --  113*  --   CO2 18*  --  19*  --   GLUCOSE 132*  --  147*  --   BUN 25*  --  23  --   CREATININE 1.14*  --  1.09*  --   CALCIUM 9.5  --  8.1*  --    < > = values in this interval not displayed.    COAG Lab Results  Component Value Date   INR 1.5 (H) 10/01/2021   INR 0.93  08/12/2010   No results found for: PTT   VASCULAR STAFF ADDENDUM: I have independently interviewed and examined the patient. I agree with the above.  Unclear cause of PEA arrest this morning. Quick ROSC with ACLS. ? Acidosis. Continue to transfuse as needed.  Not stable enough to return to OR for fasciotomies. Recommend continued packing.  Will check serially across the day.  Yevonne Aline. Stanford Breed, MD [facility] Phone Number: [phone] October 01, 2021 10:49 AM

## 2021-10-08 NOTE — Progress Notes (Signed)
Pt. Has had continued decompensation . She has become bradycardic and hypotensive  despite max dose of Levo, dobutamine and Vasopressin. She has continued oozing from fasciotomy sites. Dr. Stanford Breed spoke with family and explained poor chance of survival .  Upon arrival to the room, family members present are in agreement with DNR status. They do not want the patient to suffer. Son who is POA has asked the family members in the room to make the decision, as he does not feel he is able to do so. Patient niece  and granddaughter are in agreement with DNR.  Dr. Tamala Julian has spoken with the family. Currently waiting for other family members to arrive to say goodbye. Fentanyl started in addition to Propofol for comfort. Versed pushes are also available as needed.  Magdalen Spatz, MSN, AGACNP-BC [place] for personal pager PCCM on call pager [phone]  22-Sep-2021 2:25 PM

## 2021-10-08 NOTE — Procedures (Signed)
Central Venous Catheter Insertion Procedure Note  JACQUI HEADEN  [id number]  1956-05-12  Date:09/24/21  Time:4:43 AM   Provider Performing:Valjean Ruppel Hardin Negus   Procedure: Insertion of Non-tunneled Central Venous Catheter(36556) with US guidance (40981)   Indication(s) Medication administration  Consent Unable to obtain consent due to emergent nature of procedure.  Anesthesia Topical only with 1% lidocaine   Timeout Verified patient identification, verified procedure, site/side was marked, verified correct patient position, special equipment/implants available, medications/allergies/relevant history reviewed, required imaging and test results available.  Sterile Technique Maximal sterile technique including full sterile barrier drape, hand hygiene, sterile gown, sterile gloves, mask, hair covering, sterile ultrasound probe cover (if used).  Procedure Description Area of catheter insertion was cleaned with chlorhexidine and draped in sterile fashion.  With real-time ultrasound guidance a central venous catheter was placed into the right internal jugular vein. Nonpulsatile blood flow and easy flushing noted in all ports.  The catheter was sutured in place and sterile dressing applied.  Complications/Tolerance None; patient tolerated the procedure well. Chest X-ray is ordered to verify placement for internal jugular or subclavian cannulation.    EBL Minimal  Specimen(s) None

## 2021-10-08 NOTE — Transfer of Care (Signed)
Immediate Anesthesia Transfer of Care Note  Patient: SHERANDA SEABROOKS  Procedure(s) Performed: BILATERAL THROMBECTOMY ILIO-FEMORAL ARTERY (Bilateral: Groin) BILATERAL LOWER LEG FASCIOTOMY (Bilateral: Leg Lower) APPLICATION OF WOUND VAC BILATERAL LOWER LEGS (Bilateral: Leg Lower)  Patient Location: ICU  Anesthesia Type:General  Level of Consciousness: Patient remains intubated per anesthesia plan  Airway & Oxygen Therapy: Patient remains intubated per anesthesia plan and Patient placed on Ventilator (see vital sign flow sheet for setting)  Post-op Assessment: Report given to RN and Post -op Vital signs reviewed and stable  Post vital signs: Reviewed and stable  Last Vitals:  Vitals Value Taken Time  BP    Temp    Pulse 62 01-Oct-2021 0241  Resp 18 01-Oct-2021 0241  SpO2 100 % Oct 01, 2021 0241  Vitals shown include unvalidated device data.  Last Pain:  Vitals:   10/07/2021 2245  TempSrc:   PainSc: 7          Complications: No notable events documented.

## 2021-10-08 NOTE — Progress Notes (Signed)
Nutrition Brief Note  Chart reviewed. Per CCM pt has had continued decline with max pressors.  Pt now transitioning to comfort care.  No further nutrition interventions planned at this time.    Lockie Pares., RD, LDN, CNSC See AMiON for contact information

## 2021-10-08 NOTE — Progress Notes (Signed)
ABG results given to Eric Form, NP. No new orders given at this time.

## 2021-10-08 NOTE — Progress Notes (Signed)
PT Cancellation/Discharge Note  Patient Details Name: Toni [name] MRN: [id number] DOB: May 29, 1956   Cancelled Treatment:    Reason Eval/Treat Not Completed: Medical issues which prohibited therapy. Code blue this morning, pt now intubated and sedated. Will sign off and await new orders.    Shary Decamp [facility] 10-06-21, 12:18 PM [place] Pager [phone] Office [phone]

## 2021-10-08 NOTE — Op Note (Signed)
DATE OF SERVICE: October 05, 2021  PATIENT:  Toni [name]  65 y.o. female  PRE-OPERATIVE DIAGNOSIS:  bilateral lower extremity acute limb ischemia; left ventricular thrombus  POST-OPERATIVE DIAGNOSIS:  Same  PROCEDURE:   1) right iliofemoral thrombectomy via right femoral artery exposure 2) left iliofemoral thrombectomy via left femoral artery exposure 3) right four compartment calf fasciotomy 4) left four compartment calf fasciotomy  SURGEON:  Surgeon(s) and Role:    * Cherre Robins, MD - Primary  ASSISTANT: Kateri Plummer, PA-C  An assistant was required to facilitate exposure and expedite the case.  ANESTHESIA:   general  EBL: 450mL  BLOOD ADMINISTERED:none  DRAINS: VAC to fasciotomy wounds  LOCAL MEDICATIONS USED:  NONE  SPECIMEN:  thrombus to pathology  COUNTS: confirmed correct.  TOURNIQUET:  none  PATIENT DISPOSITION:  PACU - hemodynamically stable.   Delay start of Pharmacological VTE agent (>24hrs) due to surgical blood loss or risk of bleeding: no  INDICATION FOR PROCEDURE: Toni [name] is a 65 y.o. female with bilateral lower extremity acute limb ischemia. Right leg has rutherford 2B vs 3 symptoms. Left lower extremity rutherford 2A symptoms.  I counseled her extensively that she has a life and limb threatening event and that the right leg may not be salvageable . After careful discussion of risks, benefits, and alternatives the patient was offered thrombectomy and fasciotomies. The patient  understood and wished to proceed.  OPERATIVE FINDINGS: Organized thrombus retrieved from bilateral thrombectomies, concerning likely cardiac in origin.  Interval development of new iliac thrombus between CT scan and operative exposure.  Good technical result from thrombectomy.  Doppler flow achieved in the peroneal arteries at the ankle bilaterally at completion.  Unremarkable 4 compartment fasciotomies.  DESCRIPTION OF PROCEDURE: After identification of the patient in the  pre-operative holding area, the patient was transferred to the operating room. The patient was positioned supine on the operating room table. Anesthesia was induced. The abdomen, groins, bilateral legs were prepped and draped in standard fashion. A surgical pause was performed confirming correct patient, procedure, and operative location.  Longitudinal incision was made in the right groin over the expected course of the common femoral artery.  The incision was carried down through subcutaneous tissue until the femoral sheath was encountered.  The femoral sheath was divided sharply.  The common femoral artery was identified and exposed circumferentially from the inguinal ligament well past the femoral bifurcation.  Silastic Vesseloops were used to loop the common femoral, superficial femoral, and profunda femoris arteries.    Longitudinal incision was made in the left groin over the expected course of the common femoral artery.  The incision was carried down through subcutaneous tissue until the femoral sheath was encountered.  The femoral sheath was divided sharply.  The common femoral artery was identified and exposed circumferentially from the inguinal ligament well past the femoral bifurcation.  Silastic Vesseloops were used to loop the common femoral, superficial femoral, and profunda femoris arteries.    Patient was systemically heparinized.  Activated clotting time measurements were used at the case to guide anticoagulation.  Longitudinal arteriotomy was made just proximal to the bifurcation the common femoral artery on the right with an 11 blade.  #4 Fogarty embolectomy balloon catheter was advanced proximally and a copious amount of organized thrombus was returned.  Embolectomy was performed until no thrombus returned.  Attention was then turned distally.  Thrombectomy of the profunda artery was performed first.  Embolectomy was performed until no thrombus returned.  Brisk backbleeding  was achieved.   Attention was then turned to the superficial femoral artery.  Thrombectomy was performed with a #4 Fogarty embolectomy balloon catheter from the groin to the ankle.  A copious amount of thrombus returned.  The process was repeated until no thrombus returned.  Brisk back bleeding was achieved.  Longitudinal arteriotomy was made just proximal to the bifurcation the common femoral artery on the left with an 11 blade.  #4 Fogarty embolectomy balloon catheter was advanced proximally and a copious amount of organized thrombus was returned.  Embolectomy was performed until no thrombus returned.  Attention was then turned distally.  Thrombectomy of the profunda artery was performed first.  Embolectomy was performed until no thrombus returned.  Brisk backbleeding was achieved.  Attention was then turned to the superficial femoral artery.  Thrombectomy was performed with a #4 Fogarty embolectomy balloon catheter from the groin to the ankle.  A copious amount of thrombus returned.  The process was repeated until no thrombus returned.  Brisk back bleeding was achieved.  Longitudinal incision was made over the right medial calf to release the posterior compartments.  The incision was carried down through subcutaneous tissue until the fascia of the superficial posterior compartment was identified.  This was incised longitudinally with the Bovie electrocautery.  The muscles of the posterior compartment bulged significantly on opening the fascia.  The muscles did appear viable when interrogated with Bovie electrocautery.  Exposure was carried on the undersurface of the tibia until the deep posterior compartment was identified and incised.  Muscle bulged from this compartment as well.  Muscle in both compartments appeared chronically unhealthy with a whitish appearance.  Longitudinal incision was made over the right lateral calf to have released the lateral and anterior compartments incision was carried down through  subcutaneous tissue and the fascia of the lateral compartment was identified.  This was incised longitudinally.  The muscle did bulge.  The muscle appeared healthier here.  The septum dividing the anterior and lateral compartments was identified.  The anterior compartment was incised longitudinally.  Muscle bulged from this compartment as well.  Muscle was interrogated in both compartments using Bovie electrocautery and fasciculated.  Longitudinal incision was made over the left medial calf to release the posterior compartments.  The incision was carried down through subcutaneous tissue until the fascia of the superficial posterior compartment was identified.  This was incised longitudinally with the Bovie electrocautery.  The muscles of the posterior compartment bulged significantly on opening the fascia.  The muscles did appear viable when interrogated with Bovie electrocautery.  Exposure was carried on the undersurface of the tibia until the deep posterior compartment was identified and incised.  Muscle bulged from this compartment as well.  Muscle in both compartments appeared chronically unhealthy with a whitish appearance.  Longitudinal incision was made over the left lateral calf to have released the lateral and anterior compartments incision was carried down through subcutaneous tissue and the fascia of the lateral compartment was identified.  This was incised longitudinally.  The muscle did bulge.  The muscle appeared healthier here.  The septum dividing the anterior and lateral compartments was identified.  The anterior compartment was incised longitudinally.  Muscle bulged from this compartment as well.  Muscle was interrogated in both compartments using Bovie electrocautery and fasciculated.  Hemostasis was achieved in the surgical beds.  The groins were closed in layers using 2-0 Vicryl, 3-0 Vicryl, surgical stapler.  Vacuum sponges were applied to the open wounds in the calves and  secured with an  Ioban dressing bilaterally.  The 2 sponges were connected with a bridge and connected to negative pressure.  Doppler flow was confirmed in the ankle using a hand-held Doppler probe.  The peroneal artery was heard bilaterally.  Upon completion of the case instrument and sharps counts were confirmed correct. The patient was transferred to the PACU in good condition. I was present for all portions of the procedure.  Yevonne Aline. Stanford Breed, MD [facility] Phone Number: [phone] 09-28-2021 2:22 AM

## 2021-10-08 NOTE — Progress Notes (Signed)
Echocardiogram 2D Echocardiogram limited with definity has been performed.  Toni [name] M Oct 06, 2021, 8:11 AM

## 2021-10-08 NOTE — Discharge Summary (Signed)
DEATH SUMMARY   Patient Details  Name: Toni [name] MRN: [id number] DOB: [birth year]  Admission/Discharge Information   Admit Date:  Sep 26, 2021  Date of Death: Date of Death: 2021/09/27  Time of Death: Time of Death: 02-06-1722  Length of Stay: 1  Referring Physician: [facility]) for Hospitalization  Patient admitted to the hospital with bilateral acute lower extremity ischemia.  She also had left ventricular thrombus.  Diagnoses  Preliminary cause of death:  Cardiopulmonary arrest secondary to cardiogenic shock, acidosis, and acute blood loss anemia  Secondary Diagnoses  Heart failure Left ventricular thrombus Bilateral lower extremity acute limb ischemia  Brief Hospital Course (including significant findings, care, treatment, and services provided and events leading to death)  Toni [name] is a 65 y.o. year old female who presented to the ER evening of Sep 26, 2021 transfer from [facility].  Patient was found to have densely ischemic lower extremities.  A CT angiogram was performed revealed left ventricular thrombus and occlusive thrombus in the femoral arteries bilaterally.  The patient was somnolent and hypoxic on my evaluation.  She had no focal deficits.  She had densely ischemic right lower extremity with motor and sensory changes.  She had an ischemic left lower extremity without motor and sensory changes.  I counseled her extensively that this operation carries a high risk of mortality.  She wished to proceed.  She underwent a bilateral lower extremity thromboembolectomy from a femoral artery exposure.  I also did 4 compartment fasciotomies.  She was transferred to the ICU in critical condition.  She developed severe cardiopulmonary embarrassment.  Follow-up echocardiogram revealed severely depressed ejection fraction.  She developed worsening lactic acidosis.  Family was gathered at the bedside.  We counseled her extensively family  extensively about the patient's poor prognosis.  They elected to transition to comfort measures.  The patient was compassionately 09-27-21 and expired shortly thereafter.  Pertinent Labs and Studies  Significant Diagnostic Studies DG Chest 2 View  Result Date: 09/03/2021 CLINICAL DATA:  Shortness of breath EXAM: CHEST - 2 VIEW COMPARISON:  06/18/2019 FINDINGS: Post sternotomy changes and valve prosthesis. Cardiomegaly with vascular congestion. No pleural effusion. No consolidation. Aortic atherosclerosis. Postsurgical changes in the left axilla. IMPRESSION: Cardiomegaly with vascular congestion. Electronically Signed   By: Donavan Foil M.D.   On: 09/03/2021 22:41   CT Angio Aortobifemoral W and/or Wo Contrast  Result Date: Sep 26, 2021 CLINICAL DATA:  Loss of pedal pulses, lower extremity paresthesia EXAM: CT ANGIOGRAPHY OF ABDOMINAL AORTA WITH ILIOFEMORAL RUNOFF TECHNIQUE: Multidetector CT imaging of the abdomen, pelvis and lower extremities was performed using the standard protocol during bolus administration of intravenous contrast. Multiplanar CT image reconstructions and MIPs were obtained to evaluate the vascular anatomy. CONTRAST:  152mL OMNIPAQUE IOHEXOL 350 MG/ML SOLN COMPARISON:  None. FINDINGS: VASCULAR Aorta: Moderate atherosclerotic calcification. No aneurysm or dissection. No periaortic inflammatory change. Celiac: Patent without evidence of aneurysm, dissection, vasculitis or significant stenosis. SMA: Patent without evidence of aneurysm, dissection, vasculitis or significant stenosis. Renals: Both renal arteries are patent without evidence of aneurysm, dissection, vasculitis, fibromuscular dysplasia or significant stenosis. IMA: Patent without evidence of aneurysm, dissection, vasculitis or significant stenosis. RIGHT Lower Extremity Inflow: Mild atherosclerotic calcification. No hemodynamically significant stenosis. No aneurysm or dissection. Internal iliac artery is patent. Outflow:  There is an abrupt occlusion of the right common femoral artery demonstrating a meniscus better. Pre shaded on coronal image # 75/8 compatible with enema Liss. There is occlusion of the proximal  superficial femoral artery at its origin. Distally, the vessel is not opacified. There is nonocclusive thrombus within the proximal profundus femoral artery. There is no significant collateralization identified to the lower extremity runoff. Runoff: Non-opacified. LEFT Lower Extremity Inflow: Minimal atherosclerotic calcification. No hemodynamically significant stenosis. No aneurysm or dissection. Internal iliac arteries patent. Outflow: There is abrupt occlusion of the common femoral artery demonstrating a meniscus compatible with an embolus with extension and complete occlusion of the superficial femoral artery and profundus femoral artery origins. The vessels are quickly reconstituted thereafter with patency of the superficial femoral artery and profundus femoral artery distally Runoff: The popliteal artery is patent. There is opacification of the proximal tibioperoneal trunk and anterior tibial artery though distal opacification is poor, likely related to bolus timing. Veins: No obvious venous abnormality within the limitations of this arterial phase study. Review of the MIP images confirms the above findings. NON-VASCULAR Lower chest: A 2.2 cm thrombus is seen within the left ventricle. Cardiac size is moderately globally enlarged. Tricuspid and mitral valve replacement have been performed. Bibasilar interstitial pulmonary infiltrates likely reflect mild interstitial pulmonary edema. Small right pleural effusion is present. Hepatobiliary: No focal liver abnormality is seen. Status post cholecystectomy. No biliary dilatation. Mild periportal edema is present, nonspecific. Pancreas: Unremarkable Spleen: There is sharply marginated, wedge shaped hypoattenuation of the spleen likely reflecting a splenic infarct despite the  relatively early phase of contrast enhancement. This comprises 25-50% of the splenic parenchyma. Adrenals/Urinary Tract: The adrenal glands are unremarkable. The kidneys are normal in size and position. Mild bilateral renal cortical scarring. The bladder is unremarkable. Stomach/Bowel: The stomach, small bowel, and large bowel are unremarkable save for mild sigmoid diverticulosis. The appendix is not visualized and is likely absent. No free intraperitoneal gas or fluid. Lymphatic: No pathologic adenopathy within the abdomen and pelvis. Reproductive: Status post hysterectomy. No adnexal masses. Other: No abdominal wall hernia.  Rectum unremarkable. Musculoskeletal: No acute bone abnormality. Degenerative changes are seen within the lumbar spine. IMPRESSION: VASCULAR Embolic occlusion of the common femoral arteries bilaterally. A non opacification of the right superficial femoral artery may relate to thrombus or poor collateralization. No significant reconstitution of the right lower extremity runoff. On the left, lower extremity arterial outflow is reconstituted in there is patency of the left lower extremity runoff proximally though the distal runoff is not opacified, likely related to slow flow and resultant suboptimal bolus timing. NON-VASCULAR Left ventricular thrombus, likely the source of lower extremity embolic occlusion. Moderate global cardiomegaly. At least mild interstitial pulmonary edema likely reflecting cardiogenic failure. Small right pleural effusion. Suspected splenic infarct. Electronically Signed   By: Fidela Salisbury M.D.   On: 09/24/2021 21:57   DG CHEST PORT 1 VIEW  Result Date: 2021/09/22 CLINICAL DATA:  Central line placement film EXAM: PORTABLE CHEST 1 VIEW COMPARISON:  Portable chest 09/02/2021 sec FINDINGS: The heart is enlarged. There is perihilar vascular congestion, mild generalized interstitial edema with small pleural effusions, and perihilar opacities which are probably due to  airspace edema, less likely pneumonia. The findings are increased compared with the prior study. The aortic arch is heavily calcified and sternotomy sutures and prosthetic cardiac valves are again noted, multiple left axillary surgical clips. Endotracheal tube is in place with the tip 2.5 cm above the carina, and right IJ central line is newly noted with the tip in the upper right atrium. There is no pneumothorax. IMPRESSION: 1. Right IJ central line tip in the upper right atrium. 2. ETT tip 2.5 cm  from the carina. 3. Cardiomegaly with findings of CHF or fluid overload with interstitial edema and perihilar opacities. Small pleural effusions. Electronically Signed   By: Telford Nab M.D.   On: 09-15-2021 04:41   ECHOCARDIOGRAM LIMITED  Result Date: 15-Sep-2021    ECHOCARDIOGRAM LIMITED REPORT   Patient Name:   Toni [name] Date of Exam: 09-15-21 Medical Rec #:  [id number]      Height:       67.0 in Accession #:    [id number]     Weight:       240.0 lb Date of Birth:  [birth year]      BSA:          2.185 m Patient Age:    17 years       BP:           210/132 mmHg Patient Gender: F              HR:           141 bpm. Exam Location:  Inpatient Procedure: Limited Echo, Intracardiac Opacification Agent, Limited Color Doppler            and Cardiac Doppler STAT ECHO Indications:     Left ventricular apical thrombus [[id number]]  History:         Patient has prior history of Echocardiogram examinations, most                  recent 05/18/2021. Mitral Valve Disease; Risk Factors:Former                  Smoker. S/P Maze operation for atrial fibrillation, S/P                  tricuspid valve repair, S/P mitral valve repair, Left breast                  mass. Normocytic anemia.                   Mitral Valve: 26 mm Edwards prosthetic annuloplasty ring valve                  is present in the mitral position. Procedure Date: 02/27/2015.  Sonographer:     Darlina Sicilian RDCS Referring Phys:  [id number] [place] Diagnosing Phys:  Eleonore Chiquito MD  Sonographer Comments: Limited echo performed post cardiac arrest IMPRESSIONS  1. There is a mass in the LV (1.6 cm x 1.2 cm) that is located in the underneat the prior MV repair underneath the anterolateral papillary muscle. CT reviewed from yesterday which shows thrombus in this region. It was difficult to see the structure on this study and contrast imaging did not really help to visualize the structure. The CT scan is rather convincing for mass/thrombus. Given the wall motion abnormalities on this study, the likely diagnosis is thrombus. Would consider cardiac MRI for definitive characterization. WMA concerning for ischemic cardiomyopathy. Left ventricular ejection fraction, by estimation, is 25 to 30%. The left ventricle has severely decreased function. The left ventricle demonstrates regional wall motion abnormalities (see scoring diagram/findings for description). Indeterminate diastolic filling due to E-A fusion. There is the interventricular septum is flattened in systole and diastole, consistent with right ventricular pressure and volume overload.  2. Right ventricular systolic function is severely reduced. The right ventricular size is normal.  3. Left atrial size was moderately dilated.  4. The mitral valve has been repaired/replaced. No evidence of mitral valve regurgitation.  There is a 26 mm Edwards prosthetic annuloplasty ring present in the mitral position. Procedure Date: 02/27/2015.  5. The tricuspid valve is has been repaired/replaced. Tricuspid valve regurgitation is mild.  6. The aortic valve was not well visualized. Comparison(s): Changes from prior study are noted. Limited study shows EF is now reduced -25%. Concerns for LV mass/thrombus. FINDINGS  Left Ventricle: There is a mass in the LV (1.6 cm x 1.2 cm) that is located in the underneat the prior MV repair underneath the anterolateral papillary muscle. CT reviewed from yesterday which shows thrombus in this region. It was  difficult to see the structure on this study and contrast imaging did not really help to visualize the structure. The CT scan is rather convincing for mass/thrombus. Given the wall motion abnormalities on this study, the likely diagnosis is thrombus. Would consider cardiac MRI for definitive characterization. WMA concerning for ischemic cardiomyopathy. Left ventricular ejection fraction, by estimation, is 25 to 30%. The left ventricle has severely decreased function. The left ventricle demonstrates regional wall motion abnormalities. The interventricular septum is flattened in systole and diastole, consistent with right ventricular pressure and volume overload. Indeterminate diastolic filling due to E-A fusion.  LV Wall Scoring: The entire septum and entire inferior wall are akinetic. Right Ventricle: The right ventricular size is normal. No increase in right ventricular wall thickness. Right ventricular systolic function is severely reduced. Left Atrium: Left atrial size was moderately dilated. Right Atrium: Right atrial size was normal in size. Mitral Valve: The mitral valve has been repaired/replaced. There is a 26 mm Edwards prosthetic annuloplasty ring present in the mitral position. Procedure Date: 02/27/2015. MV peak gradient, 17.5 mmHg. The mean mitral valve gradient is 5.0 mmHg. Tricuspid Valve: The tricuspid valve is has been repaired/replaced. Tricuspid valve regurgitation is mild. Aortic Valve: The aortic valve was not well visualized. Pulmonic Valve: The pulmonic valve was not assessed. Aorta: The aortic root is normal in size and structure. Venous: The inferior vena cava was not well visualized. LEFT VENTRICLE PLAX 2D LVOT diam:     2.00 cm LV SV:         15 LV SV Index:   7 LVOT Area:     3.14 cm  LV Volumes (MOD) LV vol d, MOD A2C: 95.6 ml LV vol d, MOD A4C: 77.8 ml LV vol s, MOD A2C: 60.8 ml LV vol s, MOD A4C: 48.1 ml LV SV MOD A2C:     34.8 ml LV SV MOD A4C:     77.8 ml LV SV MOD BP:      36.2 ml  AORTIC VALVE LVOT Vmax:   53.10 cm/s LVOT Vmean:  30.000 cm/s LVOT VTI:    0.049 m MITRAL VALVE             TRICUSPID VALVE MV Area VTI:  0.54 cm   TR Peak grad:   19.0 mmHg MV Peak grad: 17.5 mmHg  TR Vmax:        218.00 cm/s MV Mean grad: 5.0 mmHg MV Vmax:      2.09 m/s   SHUNTS MV Vmean:     97.0 cm/s  Systemic VTI:  0.05 m                          Systemic Diam: 2.00 cm Eleonore Chiquito MD Electronically signed by Eleonore Chiquito MD Signature Date/Time: 10/03/2021/8:41:59 AM    Final (Updated)     Microbiology No results found for  this or any previous visit (from the past 240 hour(s)).  Lab Basic Metabolic Panel: No results for input(s): NA, K, CL, CO2, GLUCOSE, BUN, CREATININE, CALCIUM, MG, PHOS in the last 168 hours. Liver Function Tests: No results for input(s): AST, ALT, ALKPHOS, BILITOT, PROT, ALBUMIN in the last 168 hours. No results for input(s): LIPASE, AMYLASE in the last 168 hours. No results for input(s): AMMONIA in the last 168 hours. CBC: No results for input(s): WBC, NEUTROABS, HGB, HCT, MCV, PLT in the last 168 hours. Cardiac Enzymes: No results for input(s): CKTOTAL, CKMB, CKMBINDEX, TROPONINI in the last 168 hours. Sepsis Labs: No results for input(s): PROCALCITON, WBC, LATICACIDVEN in the last 168 hours.  Procedures/Operations  Lower extremity iliofemoral thromboembolectomy and 4 compartment fasciotomy 09/14/2021   Cherre Robins 09/24/2021, 10:10 AM

## 2021-10-08 NOTE — Progress Notes (Signed)
ANTICOAGULATION CONSULT NOTE - Initial Consult  Pharmacy Consult for heparin  Indication:  LV thrombus  Allergies  Allergen Reactions   Iodinated Diagnostic Agents Swelling    EYES    Patient Measurements: Height: 5\' 7"  (170.2 cm) Weight: 108.9 kg (240 lb) IBW/kg (Calculated) : 61.6 Heparin Dosing Weight: 86kg  Vital Signs: Temp: 95.9 F (35.5 C) (11/04 1000) Temp Source: Esophageal (11/04 0830) BP: 108/63 (11/04 0928) Pulse Rate: 67 (11/04 1025)  Labs: Recent Labs    10/02/2021 2057 2021/09/25 0003 09/25/2021 0304 09-25-2021 0502 Sep 25, 2021 0713 09/25/21 0726 2021-09-25 0902 2021-09-25 0950  HGB 12.3   < > 6.7* 8.5* 10.3* 9.2* 10.2*  --   HCT 38.6   < > 21.8* 26.2* 31.9* 27.0* 30.0*  --   PLT 165  --  104*  --  88*  --   --   --   APTT  --   --   --   --  52*  --   --   --   LABPROT  --   --  17.9*  --  18.8*  --   --  17.9*  INR  --   --  1.5*  --  1.6*  --   --  1.5*  HEPARINUNFRC  --   --   --   --  0.40  --   --   --   CREATININE 1.14*  --  1.09*  --   --   --   --   --   TROPONINIHS  --   --  981* 1,860*  --   --   --   --    < > = values in this interval not displayed.    Estimated Creatinine Clearance: 63.6 mL/min (A) (by C-G formula based on SCr of 1.09 mg/dL (H)).   Medical History: Past Medical History:  Diagnosis Date   Chronic combined systolic (congestive) and diastolic (congestive) heart failure ([name])    Diastolic heart failure   Degeneration of lumbar or lumbosacral intervertebral disc    Depression    Diverticulosis    Dyspnea     chronic multifactorial large component of deconditioning   Esophageal reflux    Gout    History of kidney stones    Insomnia    Working third shift   Mitral stenosis    Secondary to under-sized ring annuloplasty following mitral valve repair   Mitral valve disorders(424.0)    Morbid obesity ([name])    Normocytic anemia    Other chest pain    Other dyspnea and respiratory abnormality    Paroxysmal atrial fibrillation  ([place])    remote, prior to MAZE   Peripheral edema    chronic   S/P Maze operation for atrial fibrillation 02/27/2015   S/P mitral valve repair 02/27/2015   "radical repair" including resection of P2 segment of posterior leaflet with 26 mm Edwards Physio II annuloplasty ring   S/P tricuspid valve repair 02/27/2015   26 mm annuloplasty ring   Unspecified essential hypertension      Assessment: 49 you female here with critical leg ischemia status post vascular thrombectomy and 4 compartment fasciotomy on 11/4. She is noted with an LV thrombus and pharmacy consulted to dose heparin. She has had significant bleeding from her wound vac sites and still with oozing.  She is also noted with DIC and getting PRBCs with plans for FFP and cryoprecipitate -Hg= 10.2 (6.7 on 11/4), plt= 88  Discussed with Dr. Tamala Julian: hold  heparin for now  Goal of Therapy:  Heparin level 0.3-0.5 Monitor platelets by anticoagulation protocol: Yes   Plan:  -hold heparin -Follow progress in the morning and consider a low dose heparin infusion  Hildred Laser, PharmD Clinical Pharmacist **Pharmacist phone directory can now be found on [email or web address] ([email or web address]).  Listed under [place].

## 2021-10-08 NOTE — Progress Notes (Signed)
   10-08-2021 0700  Clinical Encounter Type  Visited With Patient  Visit Type Code  Referral From Nurse  Consult/Referral To Chaplain  Spiritual Encounters  Spiritual Needs Prayer  Stress Factors  Patient Stress Factors None identified  Family Stress Factors None identified   Chaplain met with patient du e to Code Blue activation.  No family present, spoke with staff, and am available for any support as needed.

## 2021-10-08 NOTE — Progress Notes (Signed)
  Patient with worsening shock throughout the afternoon despite high-dose pressors. Lactic acid 7.2  Patient seen by CCM. Plan for transition to comfort care once extended family arrives.   Extended family currently present at bedside.   I discussed the situation with them and explained that there is no way she can survive this event.   They were extremely tearful but realize that she would not want to suffer and would not be OK with living with significant disability if she survived this event.   I explained the process of comfort drips and terminal extubation and discontinuation of pressors/drips and they are in agreement. Orders for comfort drips given.  Additional CCT 40 mins.   Glori Bickers, MD  5:13 PM

## 2021-10-08 NOTE — Progress Notes (Signed)
2 Units PRBC, emergency release, administered. Blood checked and verified with [facility].

## 2021-10-08 NOTE — Procedures (Signed)
Extubation Procedure Note  Patient Details:   Name: EMMAGENE ORTNER DOB: 03/01/1956 MRN: [id number]   Airway Documentation:    Vent end date: Sep 16, 2021 Vent end time: 1700   Evaluation  Pt extubated to RA per comfort care orders.  Vilinda Blanks 09-16-2021, 5:01 PM

## 2021-10-08 NOTE — Progress Notes (Addendum)
NAME:  Toni [name], MRN:  [id number], DOB:  Oct 25, 1956, LOS: 1 ADMISSION DATE:  10/03/2021 CONSULTATION DATE:  2021/09/16 REFERRING MD:  Stanford Breed - VVS CHIEF COMPLAINT:  Critical limb ischemia, post-operative vent management   History of Present Illness:  Toni [name] is seen in consultation at the request of Dr. Stanford Breed (VVS) for recommendations on postoperative ventilator management.  65 year old female former smoker ( Quit 25 years ago with a 12.5 PPY Hx)  who initially presented to [facility] ED 11/3 with severe bilateral leg pain, R > L with R leg numbness and subsequent fall. PMHx significant for HTN, HFpEF (Echo 05/2021 EF 50-55%), mitral stenosis (s/p MVR and TVR 2016), PAF (s/p Maze procedure), GERD, depression, gout.  On presentation to ED, she was tachycardic and c/o severe pain with associated decreased sensation of the R leg and bilateral leg weakness with concern for critical limb ischemia. Bedside doppler of RLE demonstrated compressible popliteal and femoral veins with bilateral subcutaneous edema. Heparin was given. CTA was completed showing embolic occlusion of bilateral CFA without reconstitution of the RLE, some reconstitution of LLE as well as LV thrombus (likely source of embolic occlusions). She was subsequently transferred to [facility] for VVS evaluation and intervention. Taken to OR 11/4 for bilateral iliofemoral thrombectomy and bilateral four compartment fasciotomies (LE WV x 2 placed). Postoperatively, patient was noted to have acidosis and remained intubated; she was transferred to [facility] for ongoing care.  PCCM consulted for postoperative vent management.  Pertinent Medical History:   Past Medical History:  Diagnosis Date   Chronic combined systolic (congestive) and diastolic (congestive) heart failure ([name])    Diastolic heart failure   Degeneration of lumbar or lumbosacral intervertebral disc    Depression    Diverticulosis    Dyspnea     chronic multifactorial large component of  deconditioning   Esophageal reflux    Gout    History of kidney stones    Insomnia    Working third shift   Mitral stenosis    Secondary to under-sized ring annuloplasty following mitral valve repair   Mitral valve disorders(424.0)    Morbid obesity ([name])    Normocytic anemia    Other chest pain    Other dyspnea and respiratory abnormality    Paroxysmal atrial fibrillation ([place])    remote, prior to MAZE   Peripheral edema    chronic   S/P Maze operation for atrial fibrillation 02/27/2015   S/P mitral valve repair 02/27/2015   "radical repair" including resection of P2 segment of posterior leaflet with 26 mm Edwards Physio II annuloplasty ring   S/P tricuspid valve repair 02/27/2015   26 mm annuloplasty ring   Unspecified essential hypertension    Significant Hospital Events: Including procedures, antibiotic start and stop dates in addition to other pertinent events   11/3 - Presented to [facility] for severe leg pain, R > L. CTA with concern for bilateral embolic occlusion. 11/4 - Transferred to [facility] for VVS consult/intervention. OR for bilateral ililofemoral thromobectomies, bilateral fasciotomies. Postop to 2H for ongoing management. Hypotensive, R IJ CVL placed. 11/4 Cardiac Arrest, PEA / asystole. Time to ROSC was 1 minute Echo shows EF 25%, Large LV thrombus  Interim History / Subjective:  PCCM consulted for postoperative vent management PEA Arrest 11/4 am with time to ROSC 1 minute, following commands post arrest Lactate 5.4, BNP 2815 prior to arrest, Troponin 1860 prior to arrest HGB 6.6 , Platelets 88,000, D dimer 4.21, Fibrinogen 166, PT 18.8, INR 1.6>> DIC  Hemorrhagic shock, acute blood loss anemia  Net + 4 L   Objective:  Blood pressure 112/72, pulse 69, temperature (!) 95.4 F (35.2 C), temperature source Esophageal, resp. rate (!) 26, height 5\' 7"  (1.702 m), weight 108.9 kg, SpO2 100 %.    Vent Mode: PRVC FiO2 (%):  [40 %-100 %] 100 % Set Rate:  [16 bmp-24 bmp] 24  bmp Vt Set:  [500 mL] 500 mL PEEP:  [5 cmH20] 5 cmH20 Plateau Pressure:  [20 cmH20-21 cmH20] 21 cmH20   Intake/Output Summary (Last 24 hours) at 2021/09/15 0843 Last data filed at 09/15/21 0800 Gross per 24 hour  Intake 4874.73 ml  Output 1120 ml  Net 3754.73 ml   Filed Weights   09/19/2021 2029  Weight: 108.9 kg   Physical Examination: General: Acutely ill-appearing middle-aged woman sedated and intubated post arrest HEENT: [place]/AT, anicteric sclera, PERRL, 2 mm , moist mucous membranes. ETT/ OG tube  in place. Neuro: Sedated. Does not respond to verbal, tactile or noxious stimuli. Did wake up and follow commands post arrest CV: S1, S2, SR with prolonged QTc .547 ms, No RMG PULM: Bilateral chest excursion ,Breathing even and unlabored on vent (PEEP 5, FiO2 40). Lung fields with faint crackles (R > L), no significant rhonchi/wheezing. GI: Obese, soft, nontender, nondistended. Hypoactive bowel sounds. Extremities: Bilateral LE edema noted. Bandages in place over bilateral 4-compartment fasciotomies. Bilateral oozing noted on lower extremity dressings , Bilateral groin sites with oozing noted on dressings. Skin: Warm/dry, no rashes.  Resolved Hospital Problem List:    Assessment & Plan:   Bilateral embolic arterial occlusion LV thrombus S/p bilateral iliofemoral artery thrombectomy S/p bilateral LE 4-compartment fasciotomies Presented to [facility] 11/3 with severe bilateral leg pain, R > L. Found to have bilateral embolic arterial occlusion and LV thrombus. Transferred to [facility] for VVS input/intervention. S/p bilateral iliofemoral artery thrombectomies/fasciotomies 11/4 with Dr. Stanford Breed. WV x 2 initially placed, clotted and removed per VVS. - Postoperative management per VVS - Wound care per protocol - Heparin reinitiation per VVS once bleeding allows  Undifferentiated shock with lactic acidosis PEA Cardiac arrest 11/4 am with profound metabolic acidosis Per nursing following commands post  arrest - Goal MAP > 65 - Levophed titrated to goal>> Quad strength ordered - Milrinone per cards - Trend Coox daily - Vaso per cards - Trend LA to normal - Bicarb gtt at 125 - Increase vent rate to 32  - ABG in 1 hour - Trend WBC, fever curve - Consider EEG/ CT Head   Acute Blood Loss Anemia with Hemorrhagic Shock Likely multifactorial in the setting of thrombus with need for anticoagulation and surgical procedure. Hgb 6.7 postoperatively. Trend 11.6 -> 8.8 -> 8.5 -> 6.7. DIC Oozing from surgical wounds Plan - Transfuse 2 units PRBC - Transfuse for HGB < 7 - Trend CBC, D dimer , Fibrinogen  - FFP transfused  - Cryoprecipitate ordered - Trend Coags - Heparin on hold for now - Monitor for active bleeding  Paroxysmal Afib HFpEF Elevated Troponin and BNP S/p mitral valve repair and tricuspid valve repair - Heparin reinitiation per VVS, on hold for now in light of above - Echo results noted - F/u CTA Chest, r/o PE>> on hold for now - F/u BNP, trend troponins - Net + 4 L, will need careful monitoring  Acute hypoxemic respiratory failure in the setting of perioperative mechanical ventilation Left intubated post-procedure in the setting of acidosis. Vent liberation pending progress/mental status improvement. - Increase rate to 30  -  Continue full vent support (4-8cc/kg IBW) - Wean FiO2 for O2 sat > 90% - Daily WUA/SBT starting 11/5 - VAP bundle - Pulmonary hygiene - PAD protocol for sedation: Propofol for goal RASS 0 to -1 - F/u ABG, CXR   History of hypertension - Hold home amlodipine, losartan, metoprolol in the setting of relative hypotension/pressor requirement - Hold home diuretics (torsemide)  Previous adverse reaction to iodinated contrast Initially noted 09/01/2011; "eye swelling". Administered for CTA  at [facility]. No reaction per chart review, Epi-Pen was available PRN. - Solumedrol administered - Continue to monitor for signs/symptoms of anaphylaxis  Best  Practice: (right click and "Reselect all SmartList Selections" daily)   Per Primary Team  Labs:  CBC: Recent Labs  Lab 10/01/2021 2057 09-27-2021 0003 2021/09/27 0300 2021/09/27 0304 09/27/2021 0502 Sep 27, 2021 0713 09/27/2021 0726  WBC 11.8*  --   --  8.0  --   --   --   NEUTROABS 8.9*  --   --  7.0  --   --   --   HGB 12.3   < > 8.5* 6.7* 8.5* 10.3* 9.2*  HCT 38.6   < > 25.0* 21.8* 26.2* 31.9* 27.0*  MCV 102.1*  --   --  100.9*  --   --   --   PLT 165  --   --  104*  --  88*  --    < > = values in this interval not displayed.   Basic Metabolic Panel: Recent Labs  Lab 09/22/2021 2057 September 27, 2021 0003 09/27/2021 0124 Sep 27, 2021 0300 09-27-2021 0304 09-27-21 0726  NA 142 143 143 143 139 140  K 4.2 4.9 5.0 4.9 4.9 4.8  CL 114*  --   --   --  113*  --   CO2 18*  --   --   --  19*  --   GLUCOSE 132*  --   --   --  147*  --   BUN 25*  --   --   --  23  --   CREATININE 1.14*  --   --   --  1.09*  --   CALCIUM 9.5  --   --   --  8.1*  --    GFR: Estimated Creatinine Clearance: 63.6 mL/min (A) (by C-G formula based on SCr of 1.09 mg/dL (H)). Recent Labs  Lab 09/14/2021 2057 Sep 27, 2021 0304 27-Sep-2021 0502  WBC 11.8* 8.0  --   LATICACIDVEN 2.1*  --  5.4*   Liver Function Tests: Recent Labs  Lab 09/27/21 0304  AST 33  ALT 21  ALKPHOS 77  BILITOT 0.6  PROT 4.3*  ALBUMIN 2.2*   No results for input(s): LIPASE, AMYLASE in the last 168 hours. No results for input(s): AMMONIA in the last 168 hours.  ABG:    Component Value Date/Time   PHART 7.239 (L) 09-27-21 0726   PCO2ART 27.8 (L) 27-Sep-2021 0726   PO2ART 102 09-27-2021 0726   HCO3 12.3 (L) 27-Sep-2021 0726   TCO2 13 (L) 2021-09-27 0726   ACIDBASEDEF 14.0 (H) September 27, 2021 0726   O2SAT 98.0 09-27-21 0726    Coagulation Profile: Recent Labs  Lab 27-Sep-2021 0304 09-27-2021 0713  INR 1.5* 1.6*    Cardiac Enzymes: No results for input(s): CKTOTAL, CKMB, CKMBINDEX, TROPONINI in the last 168 hours.  HbA1C: Hgb A1c MFr Bld  Date/Time  Value Ref Range Status  09/16/2017 11:48 AM 5.4 <5.7 % of total Hgb Final    Comment:    For  the purpose of screening for the presence of diabetes: . <5.7%       Consistent with the absence of diabetes 5.7-6.4%    Consistent with increased risk for diabetes             (prediabetes) > or =6.5%  Consistent with diabetes . This assay result is consistent with a decreased risk of diabetes. . Currently, no consensus exists regarding use of hemoglobin A1c for diagnosis of diabetes in children. . According to American Diabetes Association (ADA) guidelines, hemoglobin A1c <7.0% represents optimal control in non-pregnant diabetic patients. Different metrics may apply to specific patient populations.  Standards of Medical Care in Diabetes(ADA). .   08/31/2016 10:03 AM 5.4 <5.7 % Final    Comment:      For the purpose of screening for the presence of diabetes:   <5.7%       Consistent with the absence of diabetes 5.7-6.4 %   Consistent with increased risk for diabetes (prediabetes) >=6.5 %     Consistent with diabetes   This assay result is consistent with a decreased risk of diabetes.   Currently, no consensus exists regarding use of hemoglobin A1c for diagnosis of diabetes in children.   According to American Diabetes Association (ADA) guidelines, hemoglobin A1c <7.0% represents optimal control in non-pregnant diabetic patients. Different metrics may apply to specific patient populations. Standards of Medical Care in Diabetes (ADA).      CBG: Recent Labs  Lab 09/12/2021 2257  GLUCAP 144*    Dr. Tamala Julian spoke with Niece and granddaughter at bedside.  Goals of care discussion initiated. Family understand that there is a clot in the patient's heart that needs to be addressed with blood thinners, but while she is bleeding and unstable, she cannot tolerate the treatment she needs She has received 3 units PRBC, 3 units FFP, and cryp. Heparin on hold for now, but if she continues to  stabilize, we will consider challenging her with Heparin later this afternoon. Family are aware that if she cannot tolerate the heparin, her chance of survival is low, therefore  they are discussing code status.She remains a full code for now.   Critical care time: 58 minutes   Magdalen Spatz, MSN, AGACNP-BC [place] for personal pager PCCM on call pager [phone]   Pulmonary & Critical Care 09/26/21 8:43 AM  Please see [email or web address] for pager details.  From 7A-7P if no response, please call [phone] After hours, please call ELink [phone]

## 2021-10-08 NOTE — Consult Note (Addendum)
Advanced Heart Failure Team Consult Note   Primary Physician: [facility], [place] PCP-Cardiologist:  Carlyle Dolly, MD  Reason for Consultation: Cardiogenic shock  HPI:    Toni [name] is seen today for evaluation of cardiogenic shock at the request of Dr. Harriet Masson with Cardiology This is a 65 yo female with history of MV repair and Tricuspid Valve Repair in April 2016 at [facility], chronic systolic and diastolic HF, AF s/p MAZE at time of surgery in '16, HTN. Prior LHC pre-op 2016 with no significant CAD.  Presented to [facility] ED last night with complaints of bilateral leg pain 2/2 acute limb ischemia. CTA with occluded b/l common femoral arteries. Incidentally noted LV thrombus. Emergently transferred to [facility]. Underwent right and left iliofemoral thrombectomy and r/l four compartment calf fasciotomies early this am. Remained intubated post-op d/t hypoxia and acidosis. Hgb down to 6.7, transfused with 2 units pRBCs and 2 units FFP.  Brief PEA arrest around 7:30 am. Achieved ROSC after 1 minute.  Echo today with LVEF 25-30%, LV thrombus (1.6 cm X 1.2 cm), WMA, RV severely reduced, no MR, mild TR.  Lactic acid > 7. HS troponin 981 > 1,860 > 2,700 > 3,700. BNP 2800. ECG with sinus rhythm, subtle ST elevation inferior leads and ST depression in anterolateral leads.  Co-ox 34% this am on 30 NE. Now up to 18 NE.   Family present at bedside and indicate that she had not been feeling well for at least a month.  Apparently had been declining for some time.  Review of Systems: [y] = yes, [ ]  = no  Unable to obtain from patient - on vent. General: Weight gain [ ] ; Weight loss [ ] ; Anorexia [ ] ; Fatigue [ ] ; Fever [ ] ; Chills [ ] ; Weakness [ ]   Cardiac: Chest pain/pressure [ ] ; Resting SOB [ ] ; Exertional SOB [ ] ; Orthopnea [ ] ; Pedal Edema [ ] ; Palpitations [ ] ; Syncope [ ] ; Presyncope [ ] ; Paroxysmal nocturnal dyspnea[ ]   Pulmonary: Cough [ ] ; Wheezing[ ] ; Hemoptysis[ ] ; Sputum [  ]; Snoring [ ]   GI: Vomiting[ ] ; Dysphagia[ ] ; Melena[ ] ; Hematochezia [ ] ; Heartburn[ ] ; Abdominal pain [ ] ; Constipation [ ] ; Diarrhea [ ] ; BRBPR [ ]   GU: Hematuria[ ] ; Dysuria [ ] ; Nocturia[ ]   Vascular: Pain in legs with walking [ ] ; Pain in feet with lying flat [ ] ; Non-healing sores [ ] ; Stroke [ ] ; TIA [ ] ; Slurred speech [ ] ;  Neuro: Headaches[ ] ; Vertigo[ ] ; Seizures[ ] ; Paresthesias[ ] ;Blurred vision [ ] ; Diplopia [ ] ; Vision changes [ ]   Ortho/Skin: Arthritis [ ] ; Joint pain [ ] ; Muscle pain [ ] ; Joint swelling [ ] ; Back Pain [ ] ; Rash [ ]   Psych: Depression[ ] ; Anxiety[ ]   Heme: Bleeding problems [ ] ; Clotting disorders [ ] ; Anemia [ ]   Endocrine: Diabetes [ ] ; Thyroid dysfunction[ ]   Home Medications Prior to Admission medications   Medication Sig Start Date End Date Taking? Authorizing Provider  acetaminophen (TYLENOL) 500 MG tablet Take 1,000 mg by mouth daily as needed for moderate pain or headache.    [provider]  albuterol (PROVENTIL HFA;VENTOLIN HFA) 108 (90 Base) MCG/ACT inhaler Inhale 2 puffs into the lungs every 6 (six) hours as needed for wheezing or shortness of breath. 06/29/17   Soyla Dryer, PA-C  allopurinol (ZYLOPRIM) 100 MG tablet Take 300 mg by mouth daily.  05/17/19   [provider]  allopurinol (ZYLOPRIM) 300 MG tablet Take  300 mg by mouth daily. 02/23/21   [provider]  amLODipine (NORVASC) 5 MG tablet Take 1 tablet (5 mg total) by mouth daily. 06/02/18   Caren Macadam, MD  aspirin EC 81 MG tablet Take 1 tablet (81 mg total) by mouth daily with breakfast. 06/20/19   Roxan Hockey, MD  cholecalciferol (VITAMIN D3) 25 MCG (1000 UNIT) tablet Take 1,000 Units by mouth daily.    [provider]  FLUoxetine (PROZAC) 20 MG capsule Take 1 capsule (20 mg total) by mouth daily. 06/20/19 05/01/21  Roxan Hockey, MD  gabapentin (NEURONTIN) 300 MG capsule TAKE 1 CAPSULE BY MOUTH IN THE MORNING AND 2 CAPSULES IN THE  EVENING Patient taking differently: Take 300-600 mg by mouth See admin instructions. 300mg  in the morning and 600mg  in the evening 06/21/18   Caren Macadam, MD  guaiFENesin (MUCINEX) 600 MG 12 hr tablet Take 600 mg by mouth 2 (two) times daily as needed (congestion).    [provider]  LINZESS 72 MCG capsule TAKE 1 CAPSULE (72 MCG TOTAL) BY MOUTH DAILY BEFORE BREAKFAST. Patient taking differently: Take 72 mcg by mouth daily before breakfast. 11/22/18   Annitta Needs, NP  losartan (COZAAR) 100 MG tablet Take 1 tablet (100 mg total) by mouth daily. 11/30/17   Tanda Rockers, MD  magnesium gluconate (MAGONATE) 500 MG tablet Take 500 mg by mouth daily.    [provider]  metoprolol succinate (TOPROL-XL) 100 MG 24 hr tablet TAKE 1 TABLET BY MOUTH TWICE A DAY 10/23/20   Arnoldo Lenis, MD  pantoprazole (PROTONIX) 40 MG tablet Take 1 tablet (40 mg total) by mouth 2 (two) times daily before a meal. 12/10/19   Carlis Stable, NP  potassium chloride SA (K-DUR) 20 MEQ tablet Take 1 tablet (20 mEq total) by mouth daily. 06/20/19   Roxan Hockey, MD  torsemide (DEMADEX) 20 MG tablet TAKE 3 TABLETS ALTERNATING DAYS WITH 2 TABLETS. IF WEIGHT GOES ABOVE 230 LBS THEN TAKE 3 TABLETS DAILY UNTIL BACK DOWN AND THEN RESUME ALTERNATING DAYS WITH 3 TABLETS ONE DAY AND 2 TABLETS THE NEXT 07/10/20   Arnoldo Lenis, MD  trolamine salicylate (ASPERCREME) 10 % cream Apply 1 application topically as needed for muscle pain.    [provider]    Past Medical History: Past Medical History:  Diagnosis Date   Chronic combined systolic (congestive) and diastolic (congestive) heart failure ([name])    Diastolic heart failure   Degeneration of lumbar or lumbosacral intervertebral disc    Depression    Diverticulosis    Dyspnea     chronic multifactorial large component of deconditioning   Esophageal reflux    Gout    History of kidney stones    Insomnia    Working third shift   Mitral stenosis     Secondary to under-sized ring annuloplasty following mitral valve repair   Mitral valve disorders(424.0)    Morbid obesity ([name])    Normocytic anemia    Other chest pain    Other dyspnea and respiratory abnormality    Paroxysmal atrial fibrillation ([place])    remote, prior to MAZE   Peripheral edema    chronic   S/P Maze operation for atrial fibrillation 02/27/2015   S/P mitral valve repair 02/27/2015   "radical repair" including resection of P2 segment of posterior leaflet with 26 mm Edwards Physio II annuloplasty ring   S/P tricuspid valve repair 02/27/2015   26 mm annuloplasty ring   Unspecified essential  hypertension     Past Surgical History: Past Surgical History:  Procedure Laterality Date   APPENDECTOMY     AXILLARY LYMPH NODE DISSECTION Left 01/04/2018   Procedure: LEFT AXILLARY LYMPH NODE DISSECTION;  Surgeon: [name], MD;  Location: [facility] ORS;  Service: General;  Laterality: Left;   BREAST BIOPSY Left 2009   CARDIAC VALVE SURGERY  02/27/2015   Mikel Cella. Mitral Valve Repair. Radical Repair with resection of P2 Prolapse, 26 mm Physio II Ring Annuloplasty, Tricuspid Valve Repair (26 mm Ring Annuloplasty, Cox Maze IV Procedure on 02/27/2015   CHOLECYSTECTOMY     COLONOSCOPY  2011   Dr. Laural Golden: 53mm polyp from hepatic flexure (tubular adenoma)   COLONOSCOPY N/A 11/10/2016   Dr. Gala Romney: Diverticulosis. Next colonoscopy 7 years, January 2025   ESOPHAGOGASTRODUODENOSCOPY  2005    Dr. Gala Romney : normal esophagus s/p dilation. Small hiatal hernia noted. Felt improved after dilation.    ESOPHAGOGASTRODUODENOSCOPY N/A 11/10/2016   Dr. Gala Romney: Moderate-sized hiatal hernia, esophagus normal status post empiric dilation for history of dysphagia   HEEL SPUR SURGERY Right    MALONEY DILATION N/A 11/10/2016   Procedure: Venia Minks DILATION;  Surgeon: Daneil Dolin, MD;  Location: [facility];  Service: Endoscopy;  Laterality: N/A;   MINIMALLY INVASIVE TRICUSPID VALVE REPAIR  02/27/2015    [place]   MITRAL VALVE REPAIR  02/27/2015   [place]   PARTIAL MASTECTOMY WITH NEEDLE LOCALIZATION Left 01/04/2018   Procedure: LEFT PARTIAL MASTECTOMY STATUS POST NEEDLE LOCALIZATION;  Surgeon: [name], MD;  Location: [facility] OR[facility];  Service: General;  Laterality: Left;   TEE WITHOUT CARDIOVERSION N/A 10/18/2016   Procedure: TRANSESOPHAGEAL ECHOCARDIOGRAM (TEE);  Surgeon: Arnoldo Lenis, MD;  Location: [facility];  Service: Endoscopy;  Laterality: N/A;   TOTAL ABDOMINAL HYSTERECTOMY     TOTAL KNEE ARTHROPLASTY Right    TUBAL LIGATION      Family History: Family History  Problem Relation Age of Onset   Hypertension Mother    Heart disease Mother    Kidney disease Father    Stroke Father    Diabetes Sister    Kidney disease Sister    Heart disease Sister    Hypertension Sister    Heart disease Brother    Heart attack Son    Hypertension Son    Cancer Paternal Grandmother        Breast Cancer   Heart disease Brother    Colon cancer Neg Hx    Gastric cancer Neg Hx    Esophageal cancer Neg Hx     Social History: Social History   Socioeconomic History   Marital status: Single    Spouse name: Not on file   Number of children: Not on file   Years of education: Not on file   Highest education level: Not on file  Occupational History   Not on file  Tobacco Use   Smoking status: Former    Packs/day: 0.50    Years: 25.00    Pack years: 12.50    Types: Cigarettes    Quit date: 1997    Years since quitting: 25.8   Smokeless tobacco: Former    Types: Chew    Quit date: 1975   Tobacco comments:    dipped tobacco during pregancy in [place] Use   Vaping Use: Never used  Substance and Sexual Activity   Alcohol use: Yes    Comment: a beer occasionally   Drug  use: No   Sexual activity: Yes    Partners: Male  Other Topics Concern   Not on file  Social History Narrative   Lives alone or son will stay  with her at times. Cooks. Eats well.    Social Determinants of Health   Financial Resource Strain: Not on file  Food Insecurity: Not on file  Transportation Needs: Not on file  Physical Activity: Not on file  Stress: Not on file  Social Connections: Not on file    Allergies:  Allergies  Allergen Reactions   Iodinated Diagnostic Agents Swelling    EYES    Objective:    Vital Signs:   Temp:  [93.4 F (34.1 C)-97.9 F (36.6 C)] 97.9 F (36.6 C) (11/04 1230) Pulse Rate:  [45-141] 70 (11/04 1230) Resp:  [6-30] 6 (11/04 1230) BP: (94-183)/(56-116) 108/63 (11/04 1015) SpO2:  [87 %-100 %] 100 % (11/04 1230) Arterial Line BP: (76-159)/(49-90) 93/55 (11/04 1230) FiO2 (%):  [40 %-100 %] 40 % (11/04 1102) Weight:  [108.9 kg] 108.9 kg (11/03 2029) Last BM Date:  (PTA)  Weight change: Filed Weights   09/28/2021 2029  Weight: 108.9 kg    Intake/Output:   Intake/Output Summary (Last 24 hours) at 01-Oct-2021 1345 Last data filed at October 01, 2021 1200 Gross per 24 hour  Intake 7940.98 ml  Output 1195 ml  Net 6745.98 ml      Physical Exam    General:  Intubated and sedated. Appears ill. HEENT: + ETT Neck: supple. + JVD. R IJ DVC Cor: PMI nondisplaced. Regular rate & rhythm. No rubs, gallops or murmurs. Lungs: clear Abdomen: soft, nontender, nondistended. No hepatosplenomegaly. No bruits or masses. Good bowel sounds. Extremities: no cyanosis, clubbing, rash, extremities cool, oozing from b/l groin sites, right radial A line Neuro: alert & orientedx3, cranial nerves grossly intact. moves all 4 extremities w/o difficulty. Affect pleasant  EKG    ECG this am: Sinus rhythm 93 bpm, QTC 547 ms, subtle ST elevation in inferior leads and ST depression in anterolateral leads  Labs   Basic Metabolic Panel: Recent Labs  Lab 09/08/2021 2057 2021-10-01 0003 Oct 01, 2021 0300 10-01-2021 0304 2021/10/01 0726 October 01, 2021 0902 01-Oct-2021 1042  NA 142   < > 143 139 140 140 139  K 4.2   < > 4.9 4.9  4.8 4.7 5.5*  CL 114*  --   --  113*  --   --   --   CO2 18*  --   --  19*  --   --   --   GLUCOSE 132*  --   --  147*  --   --   --   BUN 25*  --   --  23  --   --   --   CREATININE 1.14*  --   --  1.09*  --   --   --   CALCIUM 9.5  --   --  8.1*  --   --   --    < > = values in this interval not displayed.    Liver Function Tests: Recent Labs  Lab 2021-10-01 0304  AST 33  ALT 21  ALKPHOS 77  BILITOT 0.6  PROT 4.3*  ALBUMIN 2.2*   No results for input(s): LIPASE, AMYLASE in the last 168 hours. No results for input(s): AMMONIA in the last 168 hours.  CBC: Recent Labs  Lab 09/15/2021 2057 2021/10/01 0003 2021-10-01 0304 01-Oct-2021 0502 10-01-2021 4193 01-Oct-2021 0726 10-01-2021 0902 Oct 01, 2021 1042 2021/10/01  1100  WBC 11.8*  --  8.0  --   --   --   --   --  18.3*  NEUTROABS 8.9*  --  7.0  --   --   --   --   --  15.4*  HGB 12.3   < > 6.7*   < > 10.3* 9.2* 10.2* 9.2* 9.4*  HCT 38.6   < > 21.8*   < > 31.9* 27.0* 30.0* 27.0* 28.3*  MCV 102.1*  --  100.9*  --   --   --   --   --  91.9  PLT 165  --  104*  --  88*  --   --   --  61*   < > = values in this interval not displayed.    Cardiac Enzymes: No results for input(s): CKTOTAL, CKMB, CKMBINDEX, TROPONINI in the last 168 hours.  BNP: BNP (last 3 results) Recent Labs    09/15/2021 0502  BNP 2,815.7*    ProBNP (last 3 results) No results for input(s): PROBNP in the last 8760 hours.   CBG: Recent Labs  Lab 09/23/2021 2257 09-15-2021 0234 09/15/21 0943 09-15-2021 1119  GLUCAP 144* 105* 250* 232*    Coagulation Studies: Recent Labs    09-15-2021 0304 09/15/21 0713 15-Sep-2021 0950  LABPROT 17.9* 18.8* 17.9*  INR 1.5* 1.6* 1.5*     Imaging   CT Angio Aortobifemoral W and/or Wo Contrast  Result Date: 09/19/2021 CLINICAL DATA:  Loss of pedal pulses, lower extremity paresthesia EXAM: CT ANGIOGRAPHY OF ABDOMINAL AORTA WITH ILIOFEMORAL RUNOFF TECHNIQUE: Multidetector CT imaging of the abdomen, pelvis and lower extremities was  performed using the standard protocol during bolus administration of intravenous contrast. Multiplanar CT image reconstructions and MIPs were obtained to evaluate the vascular anatomy. CONTRAST:  132mL OMNIPAQUE IOHEXOL 350 MG/ML SOLN COMPARISON:  None. FINDINGS: VASCULAR Aorta: Moderate atherosclerotic calcification. No aneurysm or dissection. No periaortic inflammatory change. Celiac: Patent without evidence of aneurysm, dissection, vasculitis or significant stenosis. SMA: Patent without evidence of aneurysm, dissection, vasculitis or significant stenosis. Renals: Both renal arteries are patent without evidence of aneurysm, dissection, vasculitis, fibromuscular dysplasia or significant stenosis. IMA: Patent without evidence of aneurysm, dissection, vasculitis or significant stenosis. RIGHT Lower Extremity Inflow: Mild atherosclerotic calcification. No hemodynamically significant stenosis. No aneurysm or dissection. Internal iliac artery is patent. Outflow: There is an abrupt occlusion of the right common femoral artery demonstrating a meniscus better. Pre shaded on coronal image # 75/8 compatible with enema Liss. There is occlusion of the proximal superficial femoral artery at its origin. Distally, the vessel is not opacified. There is nonocclusive thrombus within the proximal profundus femoral artery. There is no significant collateralization identified to the lower extremity runoff. Runoff: Non-opacified. LEFT Lower Extremity Inflow: Minimal atherosclerotic calcification. No hemodynamically significant stenosis. No aneurysm or dissection. Internal iliac arteries patent. Outflow: There is abrupt occlusion of the common femoral artery demonstrating a meniscus compatible with an embolus with extension and complete occlusion of the superficial femoral artery and profundus femoral artery origins. The vessels are quickly reconstituted thereafter with patency of the superficial femoral artery and profundus femoral  artery distally Runoff: The popliteal artery is patent. There is opacification of the proximal tibioperoneal trunk and anterior tibial artery though distal opacification is poor, likely related to bolus timing. Veins: No obvious venous abnormality within the limitations of this arterial phase study. Review of the MIP images confirms the above findings. NON-VASCULAR Lower chest: A 2.2 cm thrombus is seen  within the left ventricle. Cardiac size is moderately globally enlarged. Tricuspid and mitral valve replacement have been performed. Bibasilar interstitial pulmonary infiltrates likely reflect mild interstitial pulmonary edema. Small right pleural effusion is present. Hepatobiliary: No focal liver abnormality is seen. Status post cholecystectomy. No biliary dilatation. Mild periportal edema is present, nonspecific. Pancreas: Unremarkable Spleen: There is sharply marginated, wedge shaped hypoattenuation of the spleen likely reflecting a splenic infarct despite the relatively early phase of contrast enhancement. This comprises 25-50% of the splenic parenchyma. Adrenals/Urinary Tract: The adrenal glands are unremarkable. The kidneys are normal in size and position. Mild bilateral renal cortical scarring. The bladder is unremarkable. Stomach/Bowel: The stomach, small bowel, and large bowel are unremarkable save for mild sigmoid diverticulosis. The appendix is not visualized and is likely absent. No free intraperitoneal gas or fluid. Lymphatic: No pathologic adenopathy within the abdomen and pelvis. Reproductive: Status post hysterectomy. No adnexal masses. Other: No abdominal wall hernia.  Rectum unremarkable. Musculoskeletal: No acute bone abnormality. Degenerative changes are seen within the lumbar spine. IMPRESSION: VASCULAR Embolic occlusion of the common femoral arteries bilaterally. A non opacification of the right superficial femoral artery may relate to thrombus or poor collateralization. No significant  reconstitution of the right lower extremity runoff. On the left, lower extremity arterial outflow is reconstituted in there is patency of the left lower extremity runoff proximally though the distal runoff is not opacified, likely related to slow flow and resultant suboptimal bolus timing. NON-VASCULAR Left ventricular thrombus, likely the source of lower extremity embolic occlusion. Moderate global cardiomegaly. At least mild interstitial pulmonary edema likely reflecting cardiogenic failure. Small right pleural effusion. Suspected splenic infarct. Electronically Signed   By: Fidela Salisbury M.D.   On: 09/08/2021 21:57   DG CHEST PORT 1 VIEW  Result Date: 2021/09/14 CLINICAL DATA:  Central line placement film EXAM: PORTABLE CHEST 1 VIEW COMPARISON:  Portable chest 09/02/2021 sec FINDINGS: The heart is enlarged. There is perihilar vascular congestion, mild generalized interstitial edema with small pleural effusions, and perihilar opacities which are probably due to airspace edema, less likely pneumonia. The findings are increased compared with the prior study. The aortic arch is heavily calcified and sternotomy sutures and prosthetic cardiac valves are again noted, multiple left axillary surgical clips. Endotracheal tube is in place with the tip 2.5 cm above the carina, and right IJ central line is newly noted with the tip in the upper right atrium. There is no pneumothorax. IMPRESSION: 1. Right IJ central line tip in the upper right atrium. 2. ETT tip 2.5 cm from the carina. 3. Cardiomegaly with findings of CHF or fluid overload with interstitial edema and perihilar opacities. Small pleural effusions. Electronically Signed   By: Telford Nab M.D.   On: Sep 14, 2021 04:41   ECHOCARDIOGRAM LIMITED  Result Date: Sep 14, 2021    ECHOCARDIOGRAM LIMITED REPORT   Patient Name:   Toni [name] Date of Exam: 2021-09-14 Medical Rec #:  [id number]      Height:       67.0 in Accession #:    [id number]     Weight:        240.0 lb Date of Birth:  Jan 16, 1956      BSA:          2.185 m Patient Age:    63 years       BP:           210/132 mmHg Patient Gender: F  HR:           141 bpm. Exam Location:  Inpatient Procedure: Limited Echo, Intracardiac Opacification Agent, Limited Color Doppler            and Cardiac Doppler STAT ECHO Indications:     Left ventricular apical thrombus [[id number]]  History:         Patient has prior history of Echocardiogram examinations, most                  recent 05/18/2021. Mitral Valve Disease; Risk Factors:Former                  Smoker. S/P Maze operation for atrial fibrillation, S/P                  tricuspid valve repair, S/P mitral valve repair, Left breast                  mass. Normocytic anemia.                   Mitral Valve: 26 mm Edwards prosthetic annuloplasty ring valve                  is present in the mitral position. Procedure Date: 02/27/2015.  Sonographer:     Darlina Sicilian RDCS Referring Phys:  [id number] Diagnosing Phys: Eleonore Chiquito MD  Sonographer Comments: Limited echo performed post cardiac arrest IMPRESSIONS  1. There is a mass in the LV (1.6 cm x 1.2 cm) that is located in the underneat the prior MV repair underneath the anterolateral papillary muscle. CT reviewed from yesterday which shows thrombus in this region. It was difficult to see the structure on this study and contrast imaging did not really help to visualize the structure. The CT scan is rather convincing for mass/thrombus. Given the wall motion abnormalities on this study, the likely diagnosis is thrombus. Would consider cardiac MRI for definitive characterization. WMA concerning for ischemic cardiomyopathy. Left ventricular ejection fraction, by estimation, is 25 to 30%. The left ventricle has severely decreased function. The left ventricle demonstrates regional wall motion abnormalities (see scoring diagram/findings for description). Indeterminate diastolic filling due to E-A fusion. There is the  interventricular septum is flattened in systole and diastole, consistent with right ventricular pressure and volume overload.  2. Right ventricular systolic function is severely reduced. The right ventricular size is normal.  3. Left atrial size was moderately dilated.  4. The mitral valve has been repaired/replaced. No evidence of mitral valve regurgitation. There is a 26 mm Edwards prosthetic annuloplasty ring present in the mitral position. Procedure Date: 02/27/2015.  5. The tricuspid valve is has been repaired/replaced. Tricuspid valve regurgitation is mild.  6. The aortic valve was not well visualized. Comparison(s): Changes from prior study are noted. Limited study shows EF is now reduced -25%. Concerns for LV mass/thrombus. FINDINGS  Left Ventricle: There is a mass in the LV (1.6 cm x 1.2 cm) that is located in the underneat the prior MV repair underneath the anterolateral papillary muscle. CT reviewed from yesterday which shows thrombus in this region. It was difficult to see the structure on this study and contrast imaging did not really help to visualize the structure. The CT scan is rather convincing for mass/thrombus. Given the wall motion abnormalities on this study, the likely diagnosis is thrombus. Would consider cardiac MRI for definitive characterization. WMA concerning for ischemic cardiomyopathy. Left ventricular ejection fraction, by estimation, is 25 to 30%.  The left ventricle has severely decreased function. The left ventricle demonstrates regional wall motion abnormalities. The interventricular septum is flattened in systole and diastole, consistent with right ventricular pressure and volume overload. Indeterminate diastolic filling due to E-A fusion.  LV Wall Scoring: The entire septum and entire inferior wall are akinetic. Right Ventricle: The right ventricular size is normal. No increase in right ventricular wall thickness. Right ventricular systolic function is severely reduced. Left  Atrium: Left atrial size was moderately dilated. Right Atrium: Right atrial size was normal in size. Mitral Valve: The mitral valve has been repaired/replaced. There is a 26 mm Edwards prosthetic annuloplasty ring present in the mitral position. Procedure Date: 02/27/2015. MV peak gradient, 17.5 mmHg. The mean mitral valve gradient is 5.0 mmHg. Tricuspid Valve: The tricuspid valve is has been repaired/replaced. Tricuspid valve regurgitation is mild. Aortic Valve: The aortic valve was not well visualized. Pulmonic Valve: The pulmonic valve was not assessed. Aorta: The aortic root is normal in size and structure. Venous: The inferior vena cava was not well visualized. LEFT VENTRICLE PLAX 2D LVOT diam:     2.00 cm LV SV:         15 LV SV Index:   7 LVOT Area:     3.14 cm  LV Volumes (MOD) LV vol d, MOD A2C: 95.6 ml LV vol d, MOD A4C: 77.8 ml LV vol s, MOD A2C: 60.8 ml LV vol s, MOD A4C: 48.1 ml LV SV MOD A2C:     34.8 ml LV SV MOD A4C:     77.8 ml LV SV MOD BP:      36.2 ml AORTIC VALVE LVOT Vmax:   53.10 cm/s LVOT Vmean:  30.000 cm/s LVOT VTI:    0.049 m MITRAL VALVE             TRICUSPID VALVE MV Area VTI:  0.54 cm   TR Peak grad:   19.0 mmHg MV Peak grad: 17.5 mmHg  TR Vmax:        218.00 cm/s MV Mean grad: 5.0 mmHg MV Vmax:      2.09 m/s   SHUNTS MV Vmean:     97.0 cm/s  Systemic VTI:  0.05 m                          Systemic Diam: 2.00 cm Lennie Odor MD Electronically signed by Lennie Odor MD Signature Date/Time: September 16, 2021/8:41:59 AM    Final (Updated)      Medications:     Current Medications:  sodium chloride   Intravenous Once   sodium chloride   Intravenous Once   chlorhexidine gluconate (MEDLINE KIT)  15 mL Mouth Rinse BID   Chlorhexidine Gluconate Cloth  6 each Topical Daily   [START ON 09/12/2021] docusate sodium  100 mg Oral Daily   EPINEPHrine  0.3 mg Intramuscular Once    HYDROmorphone (DILAUDID) injection  0.5 mg Intravenous Once   insulin aspart  0-15 Units Subcutaneous TID WC    mouth rinse  15 mL Mouth Rinse 10 times per day   pantoprazole (PROTONIX) IV  40 mg Intravenous Daily    Infusions:  sodium chloride Stopped (16-Sep-2021 1049)    ceFAZolin (ANCEF) IV Stopped (09/16/2021 1922)   DOBUTamine     lactated ringers Stopped (16-Sep-2021 1340)   magnesium sulfate bolus IVPB     norepinephrine (LEVOPHED) Adult infusion 40 mcg/min (September 16, 2021 1327)   propofol (DIPRIVAN) infusion 5 mcg/kg/min (09-16-21 1200)  sodium bicarbonate 150 mEq in D5W infusion 125 mL/hr at Sep 19, 2021 1200   vasopressin        Patient Profile   65 year old female with history of MV repair (with mitral valve stenosis which has been followed) tricuspid valve repair in 2016, paroxysmal atrial fibrillation s/p MAZE, chronic systolic and diastolic HF, HTN. Now admitted with acute limb ischemia s/p b/l thrombectomy and fasciotomy of both LE, complicated by shock.  Assessment/Plan  Shock -Likely mixed hemorrhagic and cardiogenic shock - Lactic acid > 7 - Co-ox 34%. On 46 NE, adding DBA 2.5. Goal MAP > 70 - Last EF 50-55% 07/22, LVEF now 20-25% with LV thrombus under anterolateral papillary muscle, RV severely reduced, MV mean gradient 5 mmHg. - No significant CAD on cath 2016. New WMA and ECG changes noted. HS troponin up to 7,262, not certain if type I or type II.  - ABLA - transfused with 2 UPRBCs so far. Groin sites oozing  2. Acute limb ischemia -Likely secondary to LV thrombus -s/p bilateral ileofemoral thrombectomies and fasciotomies early this am -AC on hold for now  3. ABLA - Received 2 units pRBCS, 2 FFP - Hgb up to 9.4  4. Mitral regurgitation: -S/p mitral valve repair at [facility] in 2016 -Moderate to severe mitral stenosis with mean gradient 11.8, EF 50-55% on echo 07/22 -Echo today LVEF 25-30%, mean gradient 5 mmHg across mitral valve  5. Tricuspid valve regurgitation: -S/p repair in 106  6. NSTEMI: -HS troponin 981 > 1,860 > 0,355 > 9,741, not certain if type I or type II in  setting of shock -No significant CAD on prior cath in 2016 -Subtle ST elevation inferior leads and ST depression in lateral leads on ECG -Unable to anticoagulate d/t bleeding  7. Paroxysmal atrial fibrillation: -S/p MAZE in 2016 -Sinus rhythm currently -No anticoagulation d/t bleeding  8. LV thrombus: -Noted on CTA and echo  9. PEA arrest: -Brief (~1 min), in setting of metabolic acidosis  10. Respiratory failure: -Vent management per CCM  11. Metabolic acidosis - Last ABG: pH 7.38/CO2 22.9/HCO3 14/O2 101  12. DIC   GOC: Awaiting more family to arrive to have goals of care discussion. She is critically ill with MSOD. Overall prognosis is poor.  Length of Stay: 1  FINCH, LINDSAY N, PA-C  September 19, 2021, 1:45 PM  Advanced Heart Failure Team Pager [phone] (M-F; 7a - 5p)  Please contact [facility] for night-coverage after hours (4p -7a ) and weekends on [email or web address]   Agree with above.   65 y/o woman with h/o CAD s/p CABG.   Has been very weak and SOB for past 2 months. Activities extremely limited  Admitted overnight with bilateral LE critical limb ischemia from cardioembolic thrombosis. Echo shows EF 25-30% (new) with LV clot.   Underwent right and left iliofemoral thrombectomy and r/l four compartment calf fasciotomies. Had PEA arrest this am.   Now on vent on NE 40 with MAPs in 40-50 range and co-ox 34%   General:  sedated unresponsive on vent  HEENT: normal + ETT Neck: supple.JVP to jaw Carotids 2+ bilat; no bruits. No lymphadenopathy or thryomegaly appreciated. Cor: PMI nondisplaced. Regular rate & rhythm. No rubs, gallops or murmurs. Lungs: coarse  Abdomen: obese soft, nontender, nondistended. No hepatosplenomegaly. No bruits or masses. Good bowel sounds. Extremities: LEs wrapped in dressings  Neuro: intubated sedated  She is critically ill with profound cardiogenic shock and bilateral LR critical limb ischemia and has very little chance of surviving this  event. And even if she does survive will likely have severe HF and possible 1 or 2 LE amputations.   I have d/w family at bedside they want to continue to support for now but are clear that she would not want to live with this level of disability and will likely plan to withdraw care later today once family arrives.   Will continue NE. Add dobutamine.  Now DNR.   D/w Drs. Tobb and Hawken.   CRITICAL CARE Performed by: Glori Bickers  Total critical care time: 45 minutes  Critical care time was exclusive of separately billable procedures and treating other patients.  Critical care was necessary to treat or prevent imminent or life-threatening deterioration.  Critical care was time spent personally by me (independent of midlevel providers or residents) on the following activities: development of treatment plan with patient and/or surrogate as well as nursing, discussions with consultants, evaluation of patient's response to treatment, examination of patient, obtaining history from patient or surrogate, ordering and performing treatments and interventions, ordering and review of laboratory studies, ordering and review of radiographic studies, pulse oximetry and re-evaluation of patient's condition. Glori Bickers, MD  5:09 PM

## 2021-10-08 NOTE — Progress Notes (Addendum)
F/u EKG from this AM reviewed with Dr. Harriet Masson. Inferior ST-T changes noted with suble ST elevation, accentuated from prior. TWI I, avL and prolonged QT persist. See consult note for formal recs.

## 2021-10-08 NOTE — Procedures (Signed)
Cardiopulmonary Resuscitation Note  Toni [name]  [id number]  [birth year]  Date:07-Oct-2021  Time:7:37 AM   Provider Performing:Del Overfelt Cipriano Mile   Procedure: Cardiopulmonary Resuscitation [phone])  Indication(s) Loss of Pulse  Consent N/A  Anesthesia N/A   Time Out N/A   Sterile Technique Hand hygiene, gloves   Procedure Description Called to patient's room for CODE BLUE. Initial rhythm was PEA/Asystole. Patient received high quality chest compressions for 1 minutes with defibrillation or cardioversion when appropriate. Epinephrine was administered every 3 minutes as directed by time Therapist, nutritional. Additional pharmacologic interventions included calcium chloride. Additional procedural interventions include bedside echo.  Return of spontaneous circulation was achieved.  Family to be notified.   Complications/Tolerance N/A   EBL N/A   Specimen(s) N/A  Estimated time to ROSC: 1 minutes

## 2021-10-08 NOTE — Progress Notes (Signed)
PCCM Interval Progress Note  4:36 AM Attempted to call sister, Toni [name], to clarify code status. Continuous ringing, no opportunity to leave VM.  4:37 AM Spoke with patient's niece, Toni [name], regarding patient's condition and code status. Toni [name] endorsed that she is the current decision-maker for patient, as they have not been able to reach/locate patient's adult son. She clarifies that patient should remain a FULL CODE and that she would want all life support interventions.   Lestine Mount, PA-C [facility] 2021-09-19 4:44 AM  Please see [email or web address] for pager details.  From 7A-7P if no response, please call [phone] After hours, please call ELink [phone]

## 2021-10-08 NOTE — Progress Notes (Signed)
Continued decline. Family at bedside. Son en route. Will pass shortly, DNR.  Erskine Emery MD PCCM

## 2021-10-08 NOTE — Progress Notes (Signed)
OT Cancellation Note  Patient Details Name: Toni [name] MRN: [id number] DOB: [birth year]   Cancelled Treatment:    Reason Eval/Treat Not Completed: Medical issues which prohibited therapy. Code blue this morning, pt now intubated and sedated. Will sign off and await new orders.   Malka So Sep 24, 2021, 8:19 AM Nestor Lewandowsky, OTR/L Acute Rehabilitation Services Pager: [phone] Office: [phone]

## 2021-10-08 NOTE — Consult Note (Addendum)
NAME:  Toni [name], MRN:  [id number], DOB:  1956-10-20, LOS: 1 ADMISSION DATE:  09/12/2021 CONSULTATION DATE:  10/06/21 REFERRING MD:  Stanford Breed - VVS CHIEF COMPLAINT:  Critical limb ischemia, post-operative vent management   History of Present Illness:  Ms. Keir is seen in consultation at the request of Dr. Stanford Breed (VVS) for recommendations on postoperative ventilator management.  65 year old woman who initially presented to [facility] ED 11/3 with severe bilateral leg pain, R > L with R leg numbness and subsequent fall. PMHx significant for HTN, HFpEF (Echo 05/2021 EF 50-55%), mitral stenosis (s/p MVR and TVR 2016), PAF (s/p Maze procedure), GERD, depression, gout.  On presentation to ED, she was tachycardic and c/o severe pain with associated decreased sensation of the R leg and bilateral leg weakness with concern for critical limb ischemia. Bedside doppler of RLE demonstrated compressible popliteal and femoral veins with bilateral subcutaneous edema. Heparin was given. CTA was completed showing embolic occlusion of bilateral CFA without reconstitution of the RLE, some reconstitution of LLE as well as LV thrombus (likely source of embolic occlusions). She was subsequently transferred to [facility] for VVS evaluation and intervention. Taken to OR 11/4 for bilateral iliofemoral thrombectomy and bilateral four compartment fasciotomies (LE WV x 2 placed). Postoperatively, patient was noted to have acidosis and remained intubated; she was transferred to [facility] for ongoing care.  PCCM consulted for postoperative vent management.  Pertinent Medical History:   Past Medical History:  Diagnosis Date   Congestive heart failure, unspecified    Diastolic heart failure   Degeneration of lumbar or lumbosacral intervertebral disc    Depression    Diverticulosis    Dyspnea     chronic multifactorial large component of deconditioning   Esophageal reflux    Gout    History of kidney stones    Insomnia    Working third  shift   Mitral stenosis    Secondary to under-sized ring annuloplasty following mitral valve repair   Mitral valve disorders(424.0)    Mild to moderate mitral regurgitation   Mitral valve insufficiency and aortic valve insufficiency    Morbid obesity ([name])    Normocytic anemia    Other chest pain    Other dyspnea and respiratory abnormality    Paroxysmal atrial fibrillation ([name])    Stable no recurrence   Peripheral edema    chronic   S/P Maze operation for atrial fibrillation 02/27/2015   S/P mitral valve repair 02/27/2015   "radical repair" including resection of P2 segment of posterior leaflet with 26 mm Edwards Physio II annuloplasty ring   S/P tricuspid valve repair 02/27/2015   26 mm annuloplasty ring   Unspecified essential hypertension    Significant Hospital Events: Including procedures, antibiotic start and stop dates in addition to other pertinent events   11/3 - Presented to [facility] for severe leg pain, R > L. CTA with concern for bilateral embolic occlusion. 11/4 - Transferred to [facility] for VVS consult/intervention. OR for bilateral ililofemoral thromobectomies, bilateral fasciotomies. Postop to 2H for ongoing management. Hypotensive, R IJ CVL placed.  Interim History / Subjective:  PCCM consulted for postoperative vent management  Objective:  Blood pressure (!) 165/114, pulse (!) 104, temperature (!) 97.1 F (36.2 C), temperature source Oral, resp. rate (!) 21, height 5\' 7"  (1.702 m), weight 108.9 kg, SpO2 97 %.    Vent Mode: PRVC FiO2 (%):  [60 %] 60 % Set Rate:  [16 bmp] 16 bmp Vt Set:  [500 mL] 500 mL PEEP:  [  [place] Pressure:  [20 cmH20] 20 cmH20   Intake/Output Summary (Last 24 hours) at 09-12-2021 0301 Last data filed at Sep 12, 2021 0202 Gross per 24 hour  Intake 3050 ml  Output 550 ml  Net 2500 ml   Filed Weights   10/05/2021 2029  Weight: 108.9 kg   Physical Examination: General: Acutely ill-appearing middle-aged woman in NAD. HEENT: [place]/AT,  anicteric sclera, PERRL, moist mucous membranes. ETT in place. Neuro: Sedated. Does not respond to verbal, tactile or noxious stimuli. Not following commands. No spontaneous movement of extremities noted. CV: Irregularly irregular, no m/g/r. PULM: Breathing even and unlabored on vent (PEEP 5, FiO2 40). Lung fields with faint crackles (R > L), no significant rhonchi/wheezing. GI: Obese, soft, nontender, nondistended. Hypoactive bowel sounds. Extremities: Bilateral LE edema noted. Bandages in place over bilateral 4-compartment fasciotomies. Bilateral groin sites with oozing noted on dressings. Skin: Warm/dry, no rashes.  Resolved Hospital Problem List:    Assessment & Plan:   Bilateral embolic arterial occlusion LV thrombus S/p bilateral iliofemoral artery thrombectomy S/p bilateral LE 4-compartment fasciotomies Presented to [facility] 11/3 with severe bilateral leg pain, R > L. Found to have bilateral embolic arterial occlusion and LV thrombus. Transferred to [facility] for VVS input/intervention. S/p bilateral iliofemoral artery thrombectomies/fasciotomies 11/4 with Dr. Stanford Breed. WV x 2 initially placed, clotted and removed per VVS. - Postoperative management per VVS - Wound care per protocol - Heparin reinitiation per VVS  Undifferentiated shock with lactic acidosis - Goal MAP > 65 - Levophed titrated to goal - Trend LA to normal - Trend WBC, fever curve  Paroxysmal Afib HFpEF S/p mitral valve repair and tricuspid valve repair - Heparin reinitiation per VVS, resume 0500 11/4 - Repeat Echo - F/u CTA Chest, r/o PE - F/u BNP, trend troponins  Acute hypoxemic respiratory failure in the setting of perioperative mechanical ventilation Left intubated post-procedure in the setting of acidosis. Vent liberation pending progress/mental status improvement. - Continue full vent support (4-8cc/kg IBW) - Wean FiO2 for O2 sat > 90% - Daily WUA/SBT - VAP bundle - Pulmonary hygiene - PAD protocol for  sedation: Propofol for goal RASS 0 to -1 - F/u ABG, CXR  Acute blood loss anemia Likely multifactorial in the setting of thrombus with need for anticoagulation and surgical procedure. Hgb 6.7 postoperatively. Trend 11.6 -> 8.8 -> 8.5 -> 6.7. - Trend H&H - Transfuse for Hgb < 7.0, 2U PRBCs now 11/4 - Monitor for signs/symptoms of active bleeding  History of hypertension - Hold home amlodipine, losartan, metoprolol in the setting of relative hypotension/pressor requirement - Hold home diuretics (torsemide)  Previous adverse reaction to iodinated contrast Initially noted 09/01/2011; "eye swelling". Administered for CTA  at [facility]. No reaction per chart review, Epi-Pen was available PRN. - Solumedrol administered - Continue to monitor for signs/symptoms of anaphylaxis  Best Practice: (right click and "Reselect all SmartList Selections" daily)   Per Primary Team  Labs:  CBC: Recent Labs  Lab 09/25/2021 2057 09-12-2021 0003 2021-09-12 0124  WBC 11.8*  --   --   NEUTROABS 8.9*  --   --   HGB 12.3 11.6* 8.8*  HCT 38.6 34.0* 26.0*  MCV 102.1*  --   --   PLT 165  --   --    Basic Metabolic Panel: Recent Labs  Lab 09/25/2021 2057 09-12-2021 0003 Sep 12, 2021 0124  NA 142 143 143  K 4.2 4.9 5.0  CL 114*  --   --   CO2 18*  --   --  GLUCOSE 132*  --   --   BUN 25*  --   --   CREATININE 1.14*  --   --   CALCIUM 9.5  --   --    GFR: Estimated Creatinine Clearance: 60.9 mL/min (A) (by C-G formula based on SCr of 1.14 mg/dL (H)). Recent Labs  Lab 10/07/2021 2057  WBC 11.8*  LATICACIDVEN 2.1*   Liver Function Tests: No results for input(s): AST, ALT, ALKPHOS, BILITOT, PROT, ALBUMIN in the last 168 hours. No results for input(s): LIPASE, AMYLASE in the last 168 hours. No results for input(s): AMMONIA in the last 168 hours.  ABG:    Component Value Date/Time   PHART 7.250 (L) 2021-10-03 0124   PCO2ART 40.4 Oct 03, 2021 0124   PO2ART 143 (H) October 03, 2021 0124   HCO3 18.2 (L) Oct 03, 2021  0124   TCO2 19 (L) 2021-10-03 0124   ACIDBASEDEF 9.0 (H) 03-Oct-2021 0124   O2SAT 99.0 10-03-2021 0124    Coagulation Profile: No results for input(s): INR, PROTIME in the last 168 hours.  Cardiac Enzymes: No results for input(s): CKTOTAL, CKMB, CKMBINDEX, TROPONINI in the last 168 hours.  HbA1C: Hgb A1c MFr Bld  Date/Time Value Ref Range Status  09/16/2017 11:48 AM 5.4 <5.7 % of total Hgb Final    Comment:    For the purpose of screening for the presence of diabetes: . <5.7%       Consistent with the absence of diabetes 5.7-6.4%    Consistent with increased risk for diabetes             (prediabetes) > or =6.5%  Consistent with diabetes . This assay result is consistent with a decreased risk of diabetes. . Currently, no consensus exists regarding use of hemoglobin A1c for diagnosis of diabetes in children. . According to American Diabetes Association (ADA) guidelines, hemoglobin A1c <7.0% represents optimal control in non-pregnant diabetic patients. Different metrics may apply to specific patient populations.  Standards of Medical Care in Diabetes(ADA). .   08/31/2016 10:03 AM 5.4 <5.7 % Final    Comment:      For the purpose of screening for the presence of diabetes:   <5.7%       Consistent with the absence of diabetes 5.7-6.4 %   Consistent with increased risk for diabetes (prediabetes) >=6.5 %     Consistent with diabetes   This assay result is consistent with a decreased risk of diabetes.   Currently, no consensus exists regarding use of hemoglobin A1c for diagnosis of diabetes in children.   According to American Diabetes Association (ADA) guidelines, hemoglobin A1c <7.0% represents optimal control in non-pregnant diabetic patients. Different metrics may apply to specific patient populations. Standards of Medical Care in Diabetes (ADA).      CBG: Recent Labs  Lab 09/13/2021 2257  GLUCAP 144*   Review of Systems:   Patient is encephalopathic and/or  intubated. Therefore history has been obtained from chart review.   Past Medical History:  She,  has a past medical history of Congestive heart failure, unspecified, Degeneration of lumbar or lumbosacral intervertebral disc, Depression, Diverticulosis, Dyspnea, Esophageal reflux, Gout, History of kidney stones, Insomnia, Mitral stenosis, Mitral valve disorders(424.0), Mitral valve insufficiency and aortic valve insufficiency, Morbid obesity ([place]), Normocytic anemia, Other chest pain, Other dyspnea and respiratory abnormality, Paroxysmal atrial fibrillation ([place]), Peripheral edema, S/P Maze operation for atrial fibrillation (02/27/2015), S/P mitral valve repair (02/27/2015), S/P tricuspid valve repair (02/27/2015), and Unspecified essential hypertension.   Surgical History:   Past Surgical  History:  Procedure Laterality Date   APPENDECTOMY     AXILLARY LYMPH NODE DISSECTION Left 01/04/2018   Procedure: LEFT AXILLARY LYMPH NODE DISSECTION;  Surgeon: [name], MD;  Location: [facility] ORS;  Service: General;  Laterality: Left;   BREAST BIOPSY Left 2009   CARDIAC VALVE SURGERY  02/27/2015   Mikel Cella. Mitral Valve Repair. Radical Repair with resection of P2 Prolapse, 26 mm Physio II Ring Annuloplasty, Tricuspid Valve Repair (26 mm Ring Annuloplasty, Cox Maze IV Procedure on 02/27/2015   CHOLECYSTECTOMY     COLONOSCOPY  2011   Dr. Laural Golden: 69mm polyp from hepatic flexure (tubular adenoma)   COLONOSCOPY N/A 11/10/2016   Dr. Gala Romney: Diverticulosis. Next colonoscopy 7 years, January 2025   ESOPHAGOGASTRODUODENOSCOPY  2005    Dr. Gala Romney : normal esophagus s/p dilation. Small hiatal hernia noted. Felt improved after dilation.    ESOPHAGOGASTRODUODENOSCOPY N/A 11/10/2016   Dr. Gala Romney: Moderate-sized hiatal hernia, esophagus normal status post empiric dilation for history of dysphagia   HEEL SPUR SURGERY Right    MALONEY DILATION N/A 11/10/2016   Procedure: Venia Minks DILATION;  Surgeon: Daneil Dolin, MD;  Location:  [facility] END[facility] SUITE;  Service: Endoscopy;  Laterality: N/A;   MINIMALLY INVASIVE TRICUSPID VALVE REPAIR  02/27/2015   [place]   MITRAL VALVE REPAIR  02/27/2015   [place]   PARTIAL MASTECTOMY WITH NEEDLE LOCALIZATION Left 01/04/2018   Procedure: LEFT PARTIAL MASTECTOMY STATUS POST NEEDLE LOCALIZATION;  Surgeon: [name], MD;  Location: [facility] ORS;  Service: General;  Laterality: Left;   TEE WITHOUT CARDIOVERSION N/A 10/18/2016   Procedure: TRANSESOPHAGEAL ECHOCARDIOGRAM (TEE);  Surgeon: Arnoldo Lenis, MD;  Location: [facility] ENDO SUITE;  Service: Endoscopy;  Laterality: N/A;   TOTAL ABDOMINAL HYSTERECTOMY     TOTAL KNEE ARTHROPLASTY Right    TUBAL LIGATION      Social History:   reports that she quit smoking about 25 years ago. Her smoking use included cigarettes. She has a 12.50 pack-year smoking history. She quit smokeless tobacco use about 47 years ago.  Her smokeless tobacco use included chew. She reports current alcohol use. She reports that she does not use drugs.   Family History:  Her family history includes Cancer in her paternal grandmother; Diabetes in her sister; Heart attack in her son; Heart disease in her brother, brother, mother, and sister; Hypertension in her mother, sister, and son; Kidney disease in her father and sister; Stroke in her father. There is no history of Colon cancer, Gastric cancer, or Esophageal cancer.   Allergies: Allergies  Allergen Reactions   Iodinated Diagnostic Agents Swelling    EYES    Home Medications: Prior to Admission medications   Medication Sig Start Date End Date Taking? Authorizing Provider  acetaminophen (TYLENOL) 500 MG tablet Take 1,000 mg by mouth daily as needed for moderate pain or headache.    [provider]  albuterol (PROVENTIL HFA;VENTOLIN HFA) 108 (90 Base) MCG/ACT inhaler Inhale 2 puffs into the lungs every 6 (six) hours as needed for wheezing or shortness of  breath. 06/29/17   Soyla Dryer, PA-C  allopurinol (ZYLOPRIM) 100 MG tablet Take 300 mg by mouth daily.  05/17/19   [provider]  allopurinol (ZYLOPRIM) 300 MG tablet Take 300 mg by mouth daily. 02/23/21   [provider]  amLODipine (NORVASC) 5 MG tablet Take 1 tablet (5 mg total) by mouth daily. 06/02/18   Caren Macadam, MD  aspirin EC 81 MG tablet Take  1 tablet (81 mg total) by mouth daily with breakfast. 06/20/19   Roxan Hockey, MD  cholecalciferol (VITAMIN D3) 25 MCG (1000 UNIT) tablet Take 1,000 Units by mouth daily.    [provider]  FLUoxetine (PROZAC) 20 MG capsule Take 1 capsule (20 mg total) by mouth daily. 06/20/19 05/01/21  Roxan Hockey, MD  gabapentin (NEURONTIN) 300 MG capsule TAKE 1 CAPSULE BY MOUTH IN THE MORNING AND 2 CAPSULES IN THE EVENING Patient taking differently: Take 300-600 mg by mouth See admin instructions. 300mg  in the morning and 600mg  in the evening 06/21/18   Caren Macadam, MD  guaiFENesin (MUCINEX) 600 MG 12 hr tablet Take 600 mg by mouth 2 (two) times daily as needed (congestion).    [provider]  LINZESS 72 MCG capsule TAKE 1 CAPSULE (72 MCG TOTAL) BY MOUTH DAILY BEFORE BREAKFAST. Patient taking differently: Take 72 mcg by mouth daily before breakfast. 11/22/18   Annitta Needs, NP  losartan (COZAAR) 100 MG tablet Take 1 tablet (100 mg total) by mouth daily. 11/30/17   Tanda Rockers, MD  magnesium gluconate (MAGONATE) 500 MG tablet Take 500 mg by mouth daily.    [provider]  metoprolol succinate (TOPROL-XL) 100 MG 24 hr tablet TAKE 1 TABLET BY MOUTH TWICE A DAY 10/23/20   Arnoldo Lenis, MD  pantoprazole (PROTONIX) 40 MG tablet Take 1 tablet (40 mg total) by mouth 2 (two) times daily before a meal. 12/10/19   Carlis Stable, NP  potassium chloride SA (K-DUR) 20 MEQ tablet Take 1 tablet (20 mEq total) by mouth daily. 06/20/19   Roxan Hockey, MD  torsemide (DEMADEX) 20 MG tablet TAKE 3 TABLETS  ALTERNATING DAYS WITH 2 TABLETS. IF WEIGHT GOES ABOVE 230 LBS THEN TAKE 3 TABLETS DAILY UNTIL BACK DOWN AND THEN RESUME ALTERNATING DAYS WITH 3 TABLETS ONE DAY AND 2 TABLETS THE NEXT 07/10/20   Arnoldo Lenis, MD  trolamine salicylate (ASPERCREME) 10 % cream Apply 1 application topically as needed for muscle pain.    [provider]    Critical care time: 45 minutes   Lestine Mount, PA-C [facility] 09-21-21 3:01 AM  Please see [email or web address] for pager details.  From 7A-7P if no response, please call [phone] After hours, please call ELink [phone]

## 2021-10-08 NOTE — Progress Notes (Signed)
[place] Progress Note Patient Name: Toni [name] DOB: 13-Oct-1956 MRN: [id number]   Date of Service  September 19, 2021  HPI/Events of Note  Multiple issues: 1. Hgb = 8.5 post transfusion. Patient is still bleeding. 2. Request to continue Foley.   eICU Interventions  Plan: Continue to trend Lactic Acid. DIC Panel STAT.  Ionized Ca++ STAT.  Continue Foley catheter.      Intervention Category Major Interventions: Other:;Acid-Base disturbance - evaluation and management  [name] 09-19-2021, 6:16 AM

## 2021-10-08 NOTE — Progress Notes (Signed)
ABG results shown to Dr Tamala Julian. Orders to increase vent rate to 24. Repeat ABG at 0900.

## 2021-10-08 NOTE — Anesthesia Procedure Notes (Signed)
Procedure Name: Intubation Date/Time: 10/03/21 12:08 AM Performed by: Clovis Cao, CRNA Pre-anesthesia Checklist: Patient identified, Emergency Drugs available, Suction available and Patient being monitored Patient Re-evaluated:Patient Re-evaluated prior to induction Oxygen Delivery Method: Circle system utilized Preoxygenation: Pre-oxygenation with 100% oxygen Induction Type: IV induction, Rapid sequence and Cricoid Pressure applied Laryngoscope Size: Miller and 2 Grade View: Grade I Tube type: Oral Tube size: 7.5 mm Number of attempts: 1 Airway Equipment and Method: Stylet Placement Confirmation: ETT inserted through vocal cords under direct vision, positive ETCO2 and breath sounds checked- equal and bilateral Secured at: 22 cm Tube secured with: Tape Dental Injury: Teeth and Oropharynx as per pre-operative assessment

## 2021-10-08 NOTE — Progress Notes (Signed)
ANTICOAGULATION CONSULT NOTE  Pharmacy Consult for Heparin  Indication: LV thrombus, acute bilateral LE ischemia s/p bilateral thrombectomies, possible splenic infarct  Allergies  Allergen Reactions   Iodinated Diagnostic Agents Swelling    EYES    Patient Measurements: Height: 5\' 7"  (170.2 cm) Weight: 108.9 kg (240 lb) IBW/kg (Calculated) : 61.6  Vital Signs: Temp: 97.1 F (36.2 C) (11/03 2226) Temp Source: Oral (11/03 2226) BP: 165/114 (11/03 2315) Pulse Rate: 104 (11/03 2315)  Labs: Recent Labs    09/20/2021 2057 10/04/2021 0003 10-04-2021 0124 2021/10/04 0300 2021-10-04 0304  HGB 12.3   < > 8.8* 8.5* 6.7*  HCT 38.6   < > 26.0* 25.0* 21.8*  PLT 165  --   --   --  104*  LABPROT  --   --   --   --  17.9*  INR  --   --   --   --  1.5*  CREATININE 1.14*  --   --   --  1.09*  TROPONINIHS  --   --   --   --  981*   < > = values in this interval not displayed.    Estimated Creatinine Clearance: 63.6 mL/min (A) (by C-G formula based on SCr of 1.09 mg/dL (H)).   Medical History: Past Medical History:  Diagnosis Date   Congestive heart failure, unspecified    Diastolic heart failure   Degeneration of lumbar or lumbosacral intervertebral disc    Depression    Diverticulosis    Dyspnea     chronic multifactorial large component of deconditioning   Esophageal reflux    Gout    History of kidney stones    Insomnia    Working third shift   Mitral stenosis    Secondary to under-sized ring annuloplasty following mitral valve repair   Mitral valve disorders(424.0)    Mild to moderate mitral regurgitation   Mitral valve insufficiency and aortic valve insufficiency    Morbid obesity ([name])    Normocytic anemia    Other chest pain    Other dyspnea and respiratory abnormality    Paroxysmal atrial fibrillation ([name])    Stable no recurrence   Peripheral edema    chronic   S/P Maze operation for atrial fibrillation 02/27/2015   S/P mitral valve repair 02/27/2015   "radical  repair" including resection of P2 segment of posterior leaflet with 26 mm Edwards Physio II annuloplasty ring   S/P tricuspid valve repair 02/27/2015   26 mm annuloplasty ring   Unspecified essential hypertension     Assessment: 65 y/o F transfer from [facility] with LV thrombus, ?splenic infarct, bilateral LE ischemia requiring bilateral thrombectomies with vascular surgery. Hgb is down post-op. Some bleeding post-op. Per RN, holding heparin from approximately 0300-0500 per Dr. Stanford Breed and then re-starting heparin given multiple embolic issues. Large intra-operative heparin bolus given.   Goal of Therapy:  Heparin level 0.3-0.7 units/ml Monitor platelets by anticoagulation protocol: Yes   Plan:  Start heparin drip at 1000 units/hr at 0500 1200 heparin level Daily CBC/heparin level Monitor for bleeding Watch Hgb closely  Narda Bonds, PharmD, BCPS Clinical Pharmacist Phone: [phone]

## 2021-10-08 NOTE — Progress Notes (Addendum)
Spoke with Dr. Stanford Breed regarding holding heparin infusion. He would prefer to continue Heparin infusion aft 3rd PRBC is complete.

## 2021-10-08 NOTE — Progress Notes (Signed)
Seen this afternoon with the heart failure team. Patient has continued ooze from fasciotomy sites. Brisk Doppler flow in the peroneal arteries bilaterally. She remains critically ill, on high-dose vasopressor support. We had a long discussion with the family and quoted her a 66 to 20% risk of survival. At the moment, they are considering no further escalation of care. Continue conservative measures only for the fasciotomy was.  A return to the operating room would be deadly. Discussed with PCCM.  They will assume care at this juncture.  We will follow closely.  Appreciate everyone's help in this difficult case.  Yevonne Aline. Stanford Breed, MD [facility] Phone Number: [phone] September 27, 2021 2:15 PM

## 2021-10-08 DEATH — deceased

## 2021-11-04 ENCOUNTER — Ambulatory Visit: Payer: Medicare HMO | Admitting: Cardiology

## 2021-11-25 ENCOUNTER — Other Ambulatory Visit: Payer: Self-pay | Admitting: Cardiology
